# Patient Record
Sex: Male | Born: 1950 | Race: White | Hispanic: No | Marital: Single | Smoking: Never smoker
Health system: Southern US, Community
[De-identification: ages and names within clinical notes are randomized; demographics above are authoritative.]

## PROBLEM LIST (undated history)

## (undated) DIAGNOSIS — Z955 Presence of coronary angioplasty implant and graft: Secondary | ICD-10-CM

## (undated) DIAGNOSIS — K769 Liver disease, unspecified: Secondary | ICD-10-CM

## (undated) DIAGNOSIS — I2510 Atherosclerotic heart disease of native coronary artery without angina pectoris: Secondary | ICD-10-CM

## (undated) DIAGNOSIS — F4310 Post-traumatic stress disorder, unspecified: Secondary | ICD-10-CM

## (undated) DIAGNOSIS — E7800 Pure hypercholesterolemia, unspecified: Secondary | ICD-10-CM

## (undated) DIAGNOSIS — I8290 Acute embolism and thrombosis of unspecified vein: Secondary | ICD-10-CM

## (undated) DIAGNOSIS — K219 Gastro-esophageal reflux disease without esophagitis: Secondary | ICD-10-CM

## (undated) DIAGNOSIS — Z9049 Acquired absence of other specified parts of digestive tract: Secondary | ICD-10-CM

## (undated) DIAGNOSIS — IMO0002 Reserved for concepts with insufficient information to code with codable children: Secondary | ICD-10-CM

## (undated) DIAGNOSIS — Z8719 Personal history of other diseases of the digestive system: Secondary | ICD-10-CM

## (undated) DIAGNOSIS — M1990 Unspecified osteoarthritis, unspecified site: Secondary | ICD-10-CM

## (undated) DIAGNOSIS — I10 Essential (primary) hypertension: Secondary | ICD-10-CM

## (undated) DIAGNOSIS — R519 Headache, unspecified: Secondary | ICD-10-CM

## (undated) DIAGNOSIS — E119 Type 2 diabetes mellitus without complications: Secondary | ICD-10-CM

## (undated) HISTORY — PX: PARTIAL NEPHRECTOMY: SHX414

## (undated) HISTORY — PX: CARPAL TUNNEL RELEASE: SHX101

## (undated) HISTORY — PX: SHOULDER SURGERY: SHX246

## (undated) HISTORY — PX: HAND SURGERY: SHX662

## (undated) HISTORY — PX: PILONIDAL CYST EXCISION: SHX744

## (undated) HISTORY — PX: STENT PLACEMENT VASCULAR (ARMC HX): HXRAD1737

## (undated) HISTORY — PX: APPENDECTOMY: SHX54

## (undated) HISTORY — PX: PARACENTESIS: SHX844

---

## 1997-12-21 ENCOUNTER — Ambulatory Visit: Admission: RE | Admit: 1997-12-21 | Discharge: 1997-12-21 | Payer: Self-pay | Admitting: Internal Medicine

## 1999-05-24 ENCOUNTER — Encounter: Payer: Self-pay | Admitting: Orthopedic Surgery

## 1999-05-24 ENCOUNTER — Encounter: Admission: RE | Admit: 1999-05-24 | Discharge: 1999-05-24 | Payer: Self-pay | Admitting: Orthopedic Surgery

## 1999-10-23 ENCOUNTER — Encounter: Payer: Self-pay | Admitting: Orthopedic Surgery

## 1999-10-23 ENCOUNTER — Encounter: Admission: RE | Admit: 1999-10-23 | Discharge: 1999-10-23 | Payer: Self-pay | Admitting: Orthopedic Surgery

## 2000-08-21 ENCOUNTER — Other Ambulatory Visit: Admission: RE | Admit: 2000-08-21 | Discharge: 2000-08-21 | Payer: Self-pay | Admitting: Orthopedic Surgery

## 2003-03-20 ENCOUNTER — Inpatient Hospital Stay (HOSPITAL_COMMUNITY): Admission: AD | Admit: 2003-03-20 | Discharge: 2003-03-22 | Payer: Self-pay | Admitting: Cardiology

## 2003-03-20 ENCOUNTER — Encounter: Payer: Self-pay | Admitting: Cardiology

## 2004-01-18 ENCOUNTER — Other Ambulatory Visit: Payer: Self-pay

## 2004-05-23 ENCOUNTER — Ambulatory Visit: Payer: Self-pay | Admitting: Cardiology

## 2005-09-25 ENCOUNTER — Inpatient Hospital Stay (HOSPITAL_COMMUNITY): Admission: EM | Admit: 2005-09-25 | Discharge: 2005-09-26 | Payer: Self-pay | Admitting: Emergency Medicine

## 2005-09-25 ENCOUNTER — Ambulatory Visit: Payer: Self-pay | Admitting: Cardiology

## 2005-10-09 ENCOUNTER — Ambulatory Visit: Payer: Self-pay | Admitting: Cardiology

## 2007-01-07 ENCOUNTER — Emergency Department: Payer: Self-pay | Admitting: Emergency Medicine

## 2007-03-11 ENCOUNTER — Encounter: Admission: RE | Admit: 2007-03-11 | Discharge: 2007-03-11 | Payer: Self-pay | Admitting: Orthopedic Surgery

## 2008-07-13 ENCOUNTER — Ambulatory Visit: Payer: Self-pay | Admitting: Internal Medicine

## 2009-06-14 ENCOUNTER — Encounter: Payer: Self-pay | Admitting: Cardiovascular Disease

## 2009-07-07 ENCOUNTER — Encounter: Payer: Self-pay | Admitting: Cardiovascular Disease

## 2009-08-07 ENCOUNTER — Encounter: Payer: Self-pay | Admitting: Cardiovascular Disease

## 2010-07-27 ENCOUNTER — Emergency Department: Payer: Self-pay | Admitting: Unknown Physician Specialty

## 2010-07-29 ENCOUNTER — Encounter: Payer: Self-pay | Admitting: Orthopedic Surgery

## 2010-11-22 NOTE — Cardiovascular Report (Signed)
NAMEKASS, HERBERGER NO.:  [id number]   MEDICAL RECORD NO.:  [id number]          PATIENT TYPE:  INP   LOCATION:  9390                         FACILITY:  [place]   PHYSICIAN:  Eustace Quail, M.D. [facility] DATE OF BIRTH:  1951-06-26   DATE OF PROCEDURE:  DATE OF DISCHARGE:  09/26/2005                              CARDIAC CATHETERIZATION   PAST MEDICAL HISTORY:  Mr. Perazzo is 60 years old and has a history of  nonobstructive coronary disease at catheterization several years ago.  Yesterday morning while at work, he developed substernal chest tightness  radiating to his back with diaphoresis and went to Dr. Vivia Budge office.  He  was transferred from Dr. Vivia Budge office to [facility] for further  evaluation and scheduled for angiography today.  His EKG was normal and his  troponin was negative.  He is diabetic and does have hyperlipidemia.   PROCEDURE:  The procedure was performed by the right femoral artery and  arterial sheath and 6-French freeform coronary catheters.  A front wall  arterial punch was performed and non-opaque contrast was used.  Distal  aortogram was performed to rule out abdominal aneurysm.  The patient  tolerated the procedure well and left the laboratory in satisfactory  condition.   The left main is free of disease.   Left anterior descending artery gave rise to three sets of perforators and  two diagonal branches.  There was 30% narrowing in the proximal LAD.  There  is 50% narrowing in the proximal portion of each of the first diagonal  branches.   The circumflex artery gave rise to a ramus branch, a large marginal branch,  and two small posterolateral branches.  These vessels were irregular, but  there is no significant obstruction.   The right coronary artery is a moderately large vessel and gave rise to a  conus branch, two ventricular branches, posterior descending branch, and  three posterolateral branches.  There was 30% narrowing  in the mid vessel  and irregularities in the proxima and mid vessel.   The left ventriculogram performed in the artery showed good wall motion with  no areas of hypokinesis.  The estimated ejection fraction was 60%.  A distal  aortogram was performed which showed patent renal arteries and no  significant aortic obstruction.  The aortic pressure was 119/72 with a mean  of 91, left ventricular pressure is 119/15.   CONCLUSION:  Nonobstructive coronary artery disease with 50% narrowing in  the proximal LAD, 50% narrowing in the first and second diagonal branches,  irregularities in the circumflex artery, and 30% narrowing in the mid right  coronary artery with normal LV function.   RECOMMENDATIONS:  Reassurance.  The etiology of the patient's symptoms are  not clear, but he does have symptoms of bloating after eating and symptoms  may be related to reflux.  His oxygen saturation is good and he has no  preexposing factors to pulmonary embolism.  We will plan a trial of a proton  pump inhibitor and arrange for him to see either Westley Foots or Dr. Debria Garret  in the  office next week.  I spoke with Westley Foots today.           ______________________________  Eustace Quail, M.D. [facility]     BB/MEDQ  D:  09/26/2005  T:  09/29/2005  Job:  [id number]   cc:   Judithe Modest, M.D.  Fax: [phone]   Sharlynn Oliphant, M.D.   CARDIOPULMONARY DEPARTMENT

## 2010-11-22 NOTE — Cardiovascular Report (Signed)
NAME:  Jerry [name], Jerry [name]                        ACCOUNT NO.:  [id number]   MEDICAL RECORD NO.:  [id number]                   PATIENT TYPE:  INP   LOCATION:  3715                                 FACILITY:  [place]   PHYSICIAN:  Scarlett Presto, M.D.                DATE OF BIRTH:  22-May-1951   DATE OF PROCEDURE:  03/21/2003  DATE OF DISCHARGE:                              CARDIAC CATHETERIZATION   HISTORY OF PRESENT ILLNESS:  Jerry [name] is a 60 year old man with no known  coronary artery disease, but risk factors of hypertension and diabetes  mellitus, who presented to the [facility] office on [facility] to see his cardiologist, Ethelle Lyon, M.D., complaining of  chest discomfort.  He was evaluated, deemed to have unstable angina, and  admitted to [facility] that day for heart catheterization the  following day.  He presents now for that procedure.   DETAILS OF THE PROCEDURE:  After obtaining informed consent, the patient was  brought to the cardiac catheterization laboratory in the fasting state.  There he was prepped and draped in the usual sterile manner and the right  groin was anesthetized using 1% lidocaine without epinephrine.  The right  femoral artery was cannulated using the modified Seldinger technique with a  6 French 10 cm sheath and left heart catheterization was performed using a 6  French Judkins left #4, a 6 French Judkins right #4, and a 6 French angled  pigtail catheter.  The pigtail catheter was used for left ventriculogram  which was shot in the 30 degree RAO view.  At the conclusion of the  procedure, the catheter was sutured back in place due to the patient having  a dose of Lovenox prior to the catheterization, as well as Integrilin.   RESULTS:  The aortic pressure was 124/70 with a mean arterial pressure of 93  mmHg.   The left ventricular pressure was 119/111 with an end-diastolic pressure of  23 mmHg.   SELECTIVE CORONARY  ANGIOGRAPHY:  The left main coronary artery is a large  vessel which is angiographically unremarkable.   The left anterior descending coronary artery is a moderate caliber vessel  with a 50% stenosis in its mid portion.  There are two diagonals that come  off just distal to the area of the lesion and then there was a second 50%  stenosis after the two diagonals.   The right coronary artery is a large dominant vessel with a 25% stenosis in  its proximal portion.  There are luminal irregularities along the mid  portion of the artery.  There are three acute marginals.  The posterior  descending artery is small.  There is a large posterolateral branching  system.  There is no significant disease in the posterior descending  coronary artery.   The left circumflex coronary artery is a medium caliber artery with a high  branching  first obtuse marginal which is small and a small second obtuse  marginal.  There is no significant disease in the circumflex coronary artery  distribution.   Left ventriculography reveals a left ventriculogram with an ejection  fraction of 63% with no wall motion abnormalities and no mitral  regurgitation.   ASSESSMENT:  1. Nonobstructive coronary disease.  2. Normal left ventricular function.   PLAN:  Discharge the patient to home in the morning.  Will aggressively  modify his risk factors and follow through from there.                                               Scarlett Presto, M.D.    JH/MEDQ  D:  03/21/2003  T:  03/21/2003  Job:  [id number]

## 2010-11-22 NOTE — Discharge Summary (Signed)
Jerry [name], [name]              ACCOUNT NO.:  [id number]   MEDICAL RECORD NO.:  [id number]          PATIENT TYPE:  INP   LOCATION:  3016                         FACILITY:  [place]   PHYSICIAN:  Eustace Quail, M.D. [facility] DATE OF BIRTH:  1951-04-08   DATE OF ADMISSION:  09/25/2005  DATE OF DISCHARGE:  09/26/2005                                 DISCHARGE SUMMARY   PROCEDURES:  1.  Cardiac catheterization.  2.  Coronary arteriogram.  3.  Left ventriculogram.   PRIMARY DIAGNOSIS:  Chest pain, status post cardiac catheterization with  nonobstructive coronary artery disease this admission, follow-up with  cardiology and with primary MD.   SECONDARY DIAGNOSES:  1.  Diabetes, hemoglobin A1c pending at the time of dictation, but recently      greater than 11 at primary MD's office.  2.  Hyperlipidemia with a total cholesterol of 139, triglycerides 159, HDL      27, LDL 80 on therapy, increase Lipitor.  3.  Hypokalemia, supplemented.  4.  Obesity.  5.  Medical noncompliance.  6.  Chronically increased liver function tests, follow-up on increased dose      of Lipitor.  7.  Hypertension.  8.  Family history of coronary artery disease.  9.  Status post shoulder surgery x3.  10. Appendectomy.  11. Allergy or intolerance to penicillin.   TIME AT DISCHARGE:  38 minutes.   HOSPITAL COURSE:  Jerry [name] is a 60 year old patient of Dr. Christy Sartorius.  She  had a cath in 2004 that showed nonobstructive disease.  He had chest pain  that radiated through to his back and was associated with diaphoresis.  He  came to the hospital and was admitted for further evaluation and treatment.  His cardiac enzymes were negative for MI.  His potassium was minimally low  at 3.4 and was supplemented.  His liver enzymes were checked and were only  minimally elevated with an SGOT of 44, SGPT of 61.  His alkaline phosphatase  and total bilirubin were within normal limits.  His CBGs were greater than  200.   It was  felt that cardiac catheterization was indicated to further define his  anatomy and this was performed on September 26, 2005.   The cardiac catheterization showed nonobstructive coronary artery disease  between 30 and 50% in the LAD, RCA and first and second diagonal branches.  His EF was normal.  There was no significant renal artery stenosis or distal  disease in his abdominal aortogram.  Dr. Olevia Perches felt that the etiology of  his symptoms was not clear but felt it was possibly reflux, so proton pump  inhibitor was added to his medication regimen.  Mr. Ryland bed rest is  pending completion but if  he ambulates without chest pain or shortness of  breath and his groin is without ecchymosis or hematoma, he is tentative  considered stable for discharge with outpatient follow-up arranged.   DISCHARGE INSTRUCTIONS:  1.  His activity level is to be reduced for two days.  2.  He is to stick to a low fat diabetic diet.  3.  He is to call our office for problems with the cath site.  4.  He is to follow up with Dr. Christy Sartorius PA or nurse practitioner on October 09, 2005, at 11 a.m.  5.  He is to see Dr. Debria Garret next week.   DISCHARGE MEDICATIONS:  1.  Nexium 40 mg a day.  2.  Lipitor 20 mg a day.  3.  Metformin 500 mg two tablets b.i.d., hold until September 29, 2005.  4.  Lisinopril/hydrochlorothiazide 12/20 mg a day.  5.  Aspirin 325 mg a day.  6.  Jenuvia 100 units daily.      Rosaria Ferries, P.A. [facility]    ______________________________  Eustace Quail, M.D. [facility]    RB/MEDQ  D:  09/26/2005  T:  09/29/2005  Job:  [id number]   cc:   Judithe Modest, M.D.  Fax: [phone]

## 2010-11-22 NOTE — Discharge Summary (Signed)
NAME:  Jerry [name], Jerry [name]                        ACCOUNT NO.:  [id number]   MEDICAL RECORD NO.:  [id number]                   PATIENT TYPE:  INP   LOCATION:  0623                                 FACILITY:  [place]   PHYSICIAN:  Ethelle Lyon, M.D.             DATE OF BIRTH:  Sep 20, 1950   DATE OF ADMISSION:  03/20/2003  DATE OF DISCHARGE:  03/22/2003                           DISCHARGE SUMMARY - REFERRING   PROCEDURES:  1. Cardiac catheterization.  2. Coronary arteriogram.  3. Left ventriculogram.   HOSPITAL COURSE:  Jerry [name] is a 60 year old male with no known history of  coronary artery disease.  He has multiple cardiac risk factors including  hyperlipidemia and uncontrolled diabetes.  He saw his family physician, Dr.  Judithe Modest, for chest pain and was referred to [facility].  He  was seen in our office on March 20, 2003 and his symptoms were  concerning for unstable anginal pain.  He was admitted to the hospital for  further evaluation and treatment.   Jerry [name] enzymes were negative for MI but with multiple cardiac risk  factors and concerning symptoms, he was scheduled for cardiac  catheterization.  This was performed on March 21, 2003.   The cardiac catheterization showed nonobstructive disease in the LAD with  two 50% lesions and a minor lesion in the RCA.  Dr. Scarlett Presto reviewed  the films and felt that his coronary artery disease was nonobstructive and  his EF was normal at 63% with no wall motion abnormalities and no MR.  He  felt that aggressive risk factor reduction was indicated.   Jerry [name], for various reasons, had not been checking his blood sugar and  had not been taking his medications.  His hemoglobin A1c was 12.5%.  The  situation was reviewed with the diabetes coordination and Dr. Jacqulyn Ducking, as well as Dr. Wilhemina Cash.  It was felt that he could be started on  Metformin 850 mg b.i.d., once the 48 hours had elapsed post  catheterization.  Also, he was given a prescription for a home glucose meter and he is to  check his blood sugars twice a day.  He is to follow up with Dr. Debria Garret and  further medication adjustments will be made by Dr. Debria Garret.   As part of his cardiac workup, a lipid profile was checked as well.  This  showed a total cholesterol of 163, triglycerides 159, HDL 33, LDL 98.  The  data were reviewed by Dr. Lattie Haw, who felt that in this patient with  significant diabetes and mild-to-moderate coronary artery disease, a statin  was indicated.  He is to follow up with liver and cholesterol testing in  three months.   By March 22, 2003, Jerry [name]'s chest pain had resolved.  His groin was  stable post catheterization.  He was ambulating without chest pain or  shortness of breath.  He had had a  beta blocker added to his medication  regimen, but his systolic blood pressure was approximately 90 at times and  this was discontinued.  He is to continue on a baby aspirin a day and other  medications.  He is to follow up with cardiology and primary care.  Mr.  [name] was considered stable for discharge on March 22, 2003.   LABORATORY VALUES:  Hemoglobin 14.9, hematocrit 43.8, WBC 7.3, platelets  211,000.  Sodium 138, potassium 3.6, chloride 105, CO2 25, BUN 7, creatinine  0.9, glucose 186.   Chest x-ray:  Lungs are clear.  No active disease.   DISCHARGE CONDITION:  Improved.   DISCHARGE DIAGNOSES:  1. Chest pain, no critical coronary artery disease by catheterization with     preserved left ventricular function.  2. Diabetes mellitus.  3. Mild hypertriglyceridemia.  4. Status post umbilical herniorrhaphy.  5. History of shoulder surgery x3.  6. Status post appendectomy.  7. Allergy to penicillin.   DISCHARGE INSTRUCTIONS:  1. His activity level is to include no strenuous activity for two days.  2. He is to stick to a diabetic diet.  3. He is to call the office for problems with the  catheterization site.  4. He is to follow up with Dr. Debria Garret.  5. He is to follow up with Dr. Ethelle Lyon on April 11, 2003 at 2:15.   DISCHARGE MEDICATIONS:  1. Coated aspirin 81 mg daily.  2. Relafen as needed.  3. Lipitor 20 mg daily.  4. Metformin 850 mg b.i.d., start March 24, 2003.      Davis Gourd, P.A. [facility]                  Ethelle Lyon, M.D.    RG/MEDQ  D:  03/22/2003  T:  03/22/2003  Job:  [id number]   cc:   Ethelle Lyon, M.D.   Judithe Modest, M.D.  [address]  [place]  [place] [address]  Fax: [phone]

## 2010-11-22 NOTE — H&P (Signed)
NAMERANDLE, SHATZER              ACCOUNT NO.:  [id number]   MEDICAL RECORD NO.:  [id number]          PATIENT TYPE:  INP   LOCATION:  1832                         FACILITY:  [place]   PHYSICIAN:  Minus Breeding, M.D. LHCDATE OF BIRTH:  April 08, 1951   DATE OF ADMISSION:  09/25/2005  DATE OF DISCHARGE:                                HISTORY & PHYSICAL   PRIMARY CARE PHYSICIAN:  Dr. Debria Garret in the [place] area.   HISTORY OF PRESENT ILLNESS:  Mr. Laverdure is a 60 year old Caucasian  gentleman with a known history of non-obstructive coronary artery disease,  status post cardiac catheterization in 2004, by Dr. Scarlett Presto, at that  time secondary to palpitations, per the patient, with recommendations for  strong life style modifications.  Mr. Williemae Natter, however, is a poorly-  controlled diabetic with a hemoglobin A1c of 11.1 yesterday by Dr. Debria Garret.  Mr. Carino presented to Dr. Debria Garret today with chest discomfort, onset around  8 a.m. this morning while he was at work doing a non-physically exerting  task.  He began having some mid-sternal discomfort that radiated through to  his back.  He rated it at a 5 at that time.  He became very diaphoretic and  pale.  He sat down and the discomfort eased off to around a 2, but continued  to maintain a slight pressure.  He went ahead and ate lunch and then went  home.  The discomfort continued to wax and wane for several hours.  He took  nitroglycerin sublingual, with relief.  He went to his primary care  physician's office complaining of chest discomfort.  He was given aspirin  and EMS was notified.  The patient continues to have chest discomfort around  a 2.  The patient states he does not check his blood sugars at home and is  not compliant with a diet.   ALLERGIES:  PENICILLIN.   MEDICATIONS:  1.  Aspirin 325 mg daily.  2.  Lipitor 5 mg daily.  3.  Metformin 1000 mg p.o. b.i.d.  4.  Zestoretic TM/12.5 mg daily.  5.  __________ 100 mg daily, which  is a new medicine.  6.  Actos 15 mg daily.   PAST MEDICAL/SURGICAL HISTORY:  1.  Coronary artery disease, status post cardiac catheterization in 2004,      showing non-obstructive coronary artery disease.  At that time the      patient had an ejection fraction of 63% with normal wall motion, and no      MR.  The right coronary artery was the large dominant vessel with a 25%      stenosis in its proximal portion with luminal irregularities along the      mid-portion of the artery.  The LAD is a moderate caliber vessel which      had a 50% stenosis in its mid-portion.  There are two diagonals that      come off just distal to the area of the lesion, and then there was a      second 50% stenosis after the first two diagonals.  2.  Hypertension.  3.  Poorly-controlled diabetes.  4.  Shoulder surgery x3.  5.  Appendectomy.  6.  Chronic increase in liver function tests.   SOCIAL HISTORY:  He lives in [place] with his wife.  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  He has two children.  Denies tobacco use.  Occasional glass of wine.  Denies any drugs or herbal medication use.  Diet  is pretty much regular.   FAMILY HISTORY:  Mother alive at age 95.  She has had five strokes, bypass,  and has had a pacer.  Father deceased at age 53, secondary to myocardial  infarction.  He also had several strokes.   REVIEW OF SYSTEMS:  Positive for blurred vision, chest pain, shortness of  breath, dyspnea on exertion, peripheral edema and pre-syncopal episodes.  He  complains of numbness and tingling in his feet.  All other systems negative  per the patient.   PHYSICAL EXAMINATION:  VITAL SIGNS:  Temperature 98.4 degrees, pulse 69,  respirations 18, blood pressure 142/79.  The patient's saturation is 98% on  room air.  GENERAL:  He is quiet, very reserved and in no acute distress.  HEENT:  Pupils equal, round, reactive to light.  Head normocephalic and  atraumatic.  Sclerae clear.  NECK:  Supple  without lymphadenopathy.  Negative bruit or jugular venous  distention.  CARDIOVASCULAR:  Heart:  Regular rate and rhythm.  S1 and S2.  Pulses are 2+  and equal, without bruits.  LUNGS:  Clear to auscultation bilaterally.  ABDOMEN:  Soft, nontender.  Positive bowel sounds.  EXTREMITIES:  Without clubbing, cyanosis or edema.  He has 2+ DP's  bilaterally.  NEUROLOGIC:  Alert and oriented x3.  Cranial nerves II-XII  grossly intact.   Chest x-ray is pending.   Electrocardiogram:  Rate of 69, sinus rhythm, with non-specific T-wave  changes.   LABORATORY DATA:  Pending.   PLAN:  Dr. Minus Breeding in to examine and assess the patient.  The patient  with very significant risk factors with a history of a 50% LAD disease in  2004, chest pain at 5, pre-test probability of obstructive coronary artery  disease.  Plan for a cardiac catheterization in the a.m.  The risks and  benefits of the procedure have been explained to the patient and to his  wife.  The patient agrees to proceed with the procedure.  We will continue  his home medications, excluding his Glucophage, gently diurese and educate  the patient with cardiac rehabilitation with a dietician and also a diabetic  educator, the importance of his risk factor modification.      Rosanne Sack, NP    ______________________________  Minus Breeding, M.D. [facility]    MB/MEDQ  D:  09/25/2005  T:  09/26/2005  Job:  [id number]

## 2010-11-22 NOTE — H&P (Signed)
NAME:  Jerry [name], Jerry [name].                       ACCOUNT NO.:  [id number]   MEDICAL RECORD NO.:  [id number]                   PATIENT TYPE:  INP   LOCATION:                                       FACILITY:  [facility]   PHYSICIAN:  Ethelle Lyon, M.D.             DATE OF BIRTH:  11/05/1950   DATE OF ADMISSION:  03/20/2003  DATE OF DISCHARGE:                                HISTORY & PHYSICAL   CHIEF COMPLAINT:  Chest discomfort.   HISTORY OF PRESENT ILLNESS:  Jerry [name] is [name] very pleasant 60 year old male  with no known history of coronary artery disease.  He does have [name] history of  diabetes mellitus.  He is currently not on therapy for this.  The patient is  referred by Dr. Debria Garret.  He has been noticing chest discomfort that has been  getting worse over the last four to five weeks.  It mostly comes on with  exertion.  It usually goes away with rest after about five to ten minutes.  He has also noted some symptoms with rest that is associated with  palpitations.  This usually goes away after about five minutes on its own.  He denies any radiation to his arms.  He denies any nausea.  He does note  radiation of the symptoms in his chest up into his neck and jaw.  He  describes his chest discomfort as [name] similar feeling as to when he eats cold  ice cream.  He also does describe it as [name] tightness.  He can usually predict  which activities will bring on his chest pain and he has been trying to  avoid those recently. Walking up the steps this morning did bring on chest  discomfort and he is having chest discomfort in the office today.  We plan  to admit him directly from our office to [facility] today for  catheterization tomorrow.   PAST MEDICAL HISTORY:  Significant for diabetes as noted above.  The patient  is supposed to be on medication but is not taking it per his own choice.  He  has been trying to lose weight.  He denies any history of hypertension,  hyperlipidemia, or  thyroid disease.  He is status post umbilical  herniorrhaphy.  He has had left shoulder surgery x3 and is status post  appendectomy.   MEDICATIONS:  1. Relafen 750 mg q.[name].m.  2. Enteric-coated aspirin 325 mg daily   ALLERGIES:  PENICILLIN.   SOCIAL HISTORY:  He denies any cigarette use.  He only drinks alcohol on  occasion.  He works at [employer] in [employer].  He is [name] Glass blower/designer.  He is  married and has two children.  He lives in [place], [place].   FAMILY HISTORY:  Significant for arteriosclerotic vascular disease and  coronary artery disease.  His father died of an MI at age 58.  He had  previous strokes.  His mother is alive at 39.  She has have five strokes and  three myocardial infarctions.  Her first MI was at age 60.  She is status  post CABG and status post pacemaker.   REVIEW OF SYSTEMS:  Please see HPI.  He denies any PND, orthopnea, or edema.  He does have nonproductive cough with his chest discomfort.  He denies any  sore throat, melena, hematochezia, fever, chills, hematuria, dysuria,  numbness, or tingling.   PHYSICAL EXAMINATION:  GENERAL:  He is [name] well-nourished, well-developed male  in no acute distress.  VITAL SIGNS:  Blood pressure is 112/80 on the right and 110/74 on the left,  pulse 72, weight 220 pounds.  HEENT:  Unremarkable.  NECK:  Without JVD or carotid bruits.  CARDIAC:  Normal S1 and S2.  Regular rate and rhythm without murmurs.  LUNGS:  Clear to auscultation bilaterally.  ABDOMEN:  Soft, nontender, without hepatosplenomegaly.  EXTREMITIES:  Without edema.  VASCULAR:  Shows 2+ femoral artery, dorsalis pedis pulses, and posterior  tibialis pulses.  There are no femoral artery bruits bilaterally.  NEUROLOGIC:  Nonfocal.   LABORATORY DATA:  Electrocardiogram in the office today reveals normal sinus  rhythm, heart rate 64, normal axis, no acute changes.   IMPRESSION:  1. Unstable angina pectoris.  2. Untreated diabetes mellitus.  3.  Significant family history for coronary artery disease.   PLAN:  The patient will be admitted from the office today directly to [facility].  We will start him on enoxaparin, aspirin, and beta blocker.  Will check serial enzymes to rule out MI.  Will set him up for left heart  catheterization tomorrow to further define his coronary anatomy.  Will also  check [name] hemoglobin A1c and monitor his sugars while he is in the hospital.  Will put him on sliding scale insulin while he is in the hospital in case  his sugars are running high.      Richardson Dopp, P.[name].                        Ethelle Lyon, M.D.    SW/MEDQ  D:  03/20/2003  T:  03/20/2003  Job:  [id number]   cc:   Jerry [name], M.D.  [address]  [place]  [place] [address]  Fax: [phone]

## 2011-03-20 ENCOUNTER — Ambulatory Visit: Payer: Self-pay

## 2011-03-26 ENCOUNTER — Ambulatory Visit: Payer: Self-pay | Admitting: Family Medicine

## 2016-01-21 ENCOUNTER — Emergency Department (HOSPITAL_COMMUNITY): Payer: Non-veteran care

## 2016-01-21 ENCOUNTER — Encounter (HOSPITAL_COMMUNITY): Payer: Self-pay | Admitting: *Deleted

## 2016-01-21 ENCOUNTER — Inpatient Hospital Stay (HOSPITAL_COMMUNITY)
Admission: EM | Admit: 2016-01-21 | Discharge: 2016-01-30 | DRG: 442 | Disposition: A | Discharge status: Home Health | Payer: Non-veteran care | Attending: Internal Medicine | Admitting: Internal Medicine

## 2016-01-21 DIAGNOSIS — E7800 Pure hypercholesterolemia, unspecified: Secondary | ICD-10-CM | POA: Diagnosis present

## 2016-01-21 DIAGNOSIS — Z79899 Other long term (current) drug therapy: Secondary | ICD-10-CM | POA: Diagnosis not present

## 2016-01-21 DIAGNOSIS — R112 Nausea with vomiting, unspecified: Secondary | ICD-10-CM | POA: Diagnosis present

## 2016-01-21 DIAGNOSIS — I10 Essential (primary) hypertension: Secondary | ICD-10-CM | POA: Diagnosis present

## 2016-01-21 DIAGNOSIS — K219 Gastro-esophageal reflux disease without esophagitis: Secondary | ICD-10-CM | POA: Diagnosis present

## 2016-01-21 DIAGNOSIS — Z7982 Long term (current) use of aspirin: Secondary | ICD-10-CM | POA: Diagnosis not present

## 2016-01-21 DIAGNOSIS — Z955 Presence of coronary angioplasty implant and graft: Secondary | ICD-10-CM

## 2016-01-21 DIAGNOSIS — I2510 Atherosclerotic heart disease of native coronary artery without angina pectoris: Secondary | ICD-10-CM

## 2016-01-21 DIAGNOSIS — N390 Urinary tract infection, site not specified: Secondary | ICD-10-CM

## 2016-01-21 DIAGNOSIS — K7460 Unspecified cirrhosis of liver: Secondary | ICD-10-CM | POA: Diagnosis present

## 2016-01-21 DIAGNOSIS — E119 Type 2 diabetes mellitus without complications: Secondary | ICD-10-CM | POA: Diagnosis present

## 2016-01-21 DIAGNOSIS — K567 Ileus, unspecified: Secondary | ICD-10-CM | POA: Diagnosis present

## 2016-01-21 DIAGNOSIS — Z7984 Long term (current) use of oral hypoglycemic drugs: Secondary | ICD-10-CM | POA: Diagnosis not present

## 2016-01-21 DIAGNOSIS — I81 Portal vein thrombosis: Secondary | ICD-10-CM

## 2016-01-21 DIAGNOSIS — E0800 Diabetes mellitus due to underlying condition with hyperosmolarity without nonketotic hyperglycemic-hyperosmolar coma (NKHHC): Secondary | ICD-10-CM | POA: Diagnosis not present

## 2016-01-21 DIAGNOSIS — K921 Melena: Secondary | ICD-10-CM | POA: Diagnosis present

## 2016-01-21 DIAGNOSIS — E118 Type 2 diabetes mellitus with unspecified complications: Secondary | ICD-10-CM | POA: Diagnosis not present

## 2016-01-21 DIAGNOSIS — Z8554 Personal history of malignant neoplasm of ureter: Secondary | ICD-10-CM

## 2016-01-21 DIAGNOSIS — E860 Dehydration: Secondary | ICD-10-CM | POA: Diagnosis present

## 2016-01-21 DIAGNOSIS — Z85528 Personal history of other malignant neoplasm of kidney: Secondary | ICD-10-CM | POA: Diagnosis not present

## 2016-01-21 DIAGNOSIS — R509 Fever, unspecified: Secondary | ICD-10-CM | POA: Diagnosis not present

## 2016-01-21 DIAGNOSIS — R0602 Shortness of breath: Secondary | ICD-10-CM

## 2016-01-21 DIAGNOSIS — I2583 Coronary atherosclerosis due to lipid rich plaque: Secondary | ICD-10-CM

## 2016-01-21 HISTORY — DX: Reserved for concepts with insufficient information to code with codable children: IMO0002

## 2016-01-21 HISTORY — DX: Pure hypercholesterolemia, unspecified: E78.00

## 2016-01-21 HISTORY — DX: Essential (primary) hypertension: I10

## 2016-01-21 HISTORY — DX: Type 2 diabetes mellitus without complications: E11.9

## 2016-01-21 LAB — CBC
HEMATOCRIT: 42.7 % (ref 39.0–52.0)
Hemoglobin: 14.4 g/dL (ref 13.0–17.0)
MCH: 31 pg (ref 26.0–34.0)
MCHC: 33.7 g/dL (ref 30.0–36.0)
MCV: 91.8 fL (ref 78.0–100.0)
Platelets: 173 10*3/uL (ref 150–400)
RBC: 4.65 MIL/uL (ref 4.22–5.81)
RDW: 13.4 % (ref 11.5–15.5)
WBC: 9.7 10*3/uL (ref 4.0–10.5)

## 2016-01-21 LAB — LIPASE, BLOOD: LIPASE: 46 U/L (ref 11–51)

## 2016-01-21 LAB — URINALYSIS, ROUTINE W REFLEX MICROSCOPIC
Bilirubin Urine: NEGATIVE
GLUCOSE, UA: NEGATIVE mg/dL
Ketones, ur: NEGATIVE mg/dL
LEUKOCYTES UA: NEGATIVE
Nitrite: POSITIVE — AB
PH: 5 (ref 5.0–8.0)
Protein, ur: NEGATIVE mg/dL
Specific Gravity, Urine: 1.027 (ref 1.005–1.030)

## 2016-01-21 LAB — URINE MICROSCOPIC-ADD ON

## 2016-01-21 LAB — GLUCOSE, CAPILLARY
GLUCOSE-CAPILLARY: 328 mg/dL — AB (ref 65–99)
GLUCOSE-CAPILLARY: 252 mg/dL — AB (ref 65–99)
Glucose-Capillary: 310 mg/dL — ABNORMAL HIGH (ref 65–99)

## 2016-01-21 LAB — COMPREHENSIVE METABOLIC PANEL
ALT: 66 U/L — ABNORMAL HIGH (ref 17–63)
AST: 61 U/L — AB (ref 15–41)
Albumin: 3.2 g/dL — ABNORMAL LOW (ref 3.5–5.0)
Alkaline Phosphatase: 126 U/L (ref 38–126)
Anion gap: 6 (ref 5–15)
BUN: 15 mg/dL (ref 6–20)
CHLORIDE: 107 mmol/L (ref 101–111)
CO2: 26 mmol/L (ref 22–32)
Calcium: 8.9 mg/dL (ref 8.9–10.3)
Creatinine, Ser: 0.86 mg/dL (ref 0.61–1.24)
GFR calc Af Amer: >60 mL/min (ref >60)
Glucose, Bld: 199 mg/dL — ABNORMAL HIGH (ref 65–99)
POTASSIUM: 3.8 mmol/L (ref 3.5–5.1)
SODIUM: 139 mmol/L (ref 135–145)
Total Bilirubin: 0.8 mg/dL (ref 0.3–1.2)
Total Protein: 7 g/dL (ref 6.5–8.1)

## 2016-01-21 LAB — MRSA PCR SCREENING: MRSA by PCR: NEGATIVE

## 2016-01-21 LAB — HEPARIN LEVEL (UNFRACTIONATED): Heparin Unfractionated: 0.34 IU/mL (ref 0.30–0.70)

## 2016-01-21 LAB — LACTIC ACID, PLASMA
LACTIC ACID, VENOUS: 2 mmol/L — AB (ref 0.5–1.9)
LACTIC ACID, VENOUS: 2.8 mmol/L — AB (ref 0.5–1.9)

## 2016-01-21 MED ORDER — FENTANYL CITRATE (PF) 100 MCG/2ML IJ SOLN
100.0000 ug | Freq: Once | INTRAMUSCULAR | Status: AC
  Administered 2016-01-21: 100 ug via INTRAVENOUS
  Filled 2016-01-21: qty 2

## 2016-01-21 MED ORDER — ONDANSETRON HCL 4 MG/2ML IJ SOLN
4.0000 mg | Freq: Once | INTRAMUSCULAR | Status: AC
  Administered 2016-01-21: 4 mg via INTRAVENOUS
  Filled 2016-01-21: qty 2

## 2016-01-21 MED ORDER — HYDROCODONE-ACETAMINOPHEN 5-325 MG PO TABS
1.0000 | ORAL_TABLET | ORAL | Status: DC | PRN
  Administered 2016-01-21 – 2016-01-27 (×9): 2 via ORAL
  Filled 2016-01-21 (×10): qty 2

## 2016-01-21 MED ORDER — ONDANSETRON 4 MG PO TBDP
4.0000 mg | ORAL_TABLET | Freq: Once | ORAL | Status: AC | PRN
  Administered 2016-01-21: 4 mg via ORAL

## 2016-01-21 MED ORDER — ACETAMINOPHEN 650 MG RE SUPP: 650.0000 mg | Freq: Four times a day (QID) | RECTAL | Status: DC | PRN

## 2016-01-21 MED ORDER — IOPAMIDOL (ISOVUE-300) INJECTION 61%
INTRAVENOUS | Status: AC
  Administered 2016-01-21: 100 mL
  Filled 2016-01-21: qty 100

## 2016-01-21 MED ORDER — SODIUM CHLORIDE 0.9 % IV BOLUS (SEPSIS)
1000.0000 mL | Freq: Once | INTRAVENOUS | Status: AC
  Administered 2016-01-21: 1000 mL via INTRAVENOUS

## 2016-01-21 MED ORDER — BISACODYL 10 MG RE SUPP
10.0000 mg | Freq: Every day | RECTAL | Status: DC | PRN
  Administered 2016-01-24: 10 mg via RECTAL
  Filled 2016-01-21 (×2): qty 1

## 2016-01-21 MED ORDER — HYDROMORPHONE HCL 1 MG/ML IJ SOLN
1.0000 mg | Freq: Once | INTRAMUSCULAR | Status: AC
  Administered 2016-01-21: 1 mg via INTRAVENOUS
  Filled 2016-01-21: qty 1

## 2016-01-21 MED ORDER — ONDANSETRON 4 MG PO TBDP
ORAL_TABLET | ORAL | Status: AC
  Filled 2016-01-21: qty 1

## 2016-01-21 MED ORDER — MORPHINE SULFATE (PF) 2 MG/ML IV SOLN
1.0000 mg | INTRAVENOUS | Status: DC | PRN
  Administered 2016-01-21 – 2016-01-25 (×16): 1 mg via INTRAVENOUS
  Filled 2016-01-21 (×17): qty 1

## 2016-01-21 MED ORDER — ISOSORBIDE DINITRATE 10 MG PO TABS
30.0000 mg | ORAL_TABLET | Freq: Three times a day (TID) | ORAL | Status: DC
  Administered 2016-01-21 – 2016-01-30 (×26): 30 mg via ORAL
  Filled 2016-01-21: qty 3
  Filled 2016-01-21 (×4): qty 1
  Filled 2016-01-21 (×2): qty 3
  Filled 2016-01-21: qty 1
  Filled 2016-01-21 (×4): qty 3
  Filled 2016-01-21: qty 1
  Filled 2016-01-21 (×3): qty 3
  Filled 2016-01-21: qty 1
  Filled 2016-01-21: qty 3
  Filled 2016-01-21 (×2): qty 1
  Filled 2016-01-21 (×7): qty 3
  Filled 2016-01-21: qty 1
  Filled 2016-01-21 (×2): qty 3

## 2016-01-21 MED ORDER — ONDANSETRON HCL 4 MG PO TABS
4.0000 mg | ORAL_TABLET | Freq: Four times a day (QID) | ORAL | Status: DC | PRN
  Administered 2016-01-21 – 2016-01-22 (×2): 4 mg via ORAL
  Filled 2016-01-21 (×2): qty 1

## 2016-01-21 MED ORDER — ONDANSETRON HCL 4 MG/2ML IJ SOLN
4.0000 mg | Freq: Four times a day (QID) | INTRAMUSCULAR | Status: DC | PRN
  Administered 2016-01-21 – 2016-01-22 (×2): 4 mg via INTRAVENOUS
  Filled 2016-01-21 (×2): qty 2

## 2016-01-21 MED ORDER — TRAZODONE HCL 50 MG PO TABS: 25.0000 mg | ORAL_TABLET | Freq: Every evening | ORAL | Status: DC | PRN

## 2016-01-21 MED ORDER — SODIUM CHLORIDE 0.9 % IV BOLUS (SEPSIS)
500.0000 mL | Freq: Once | INTRAVENOUS | Status: AC
  Administered 2016-01-21: 500 mL via INTRAVENOUS

## 2016-01-21 MED ORDER — HEPARIN (PORCINE) IN NACL 100-0.45 UNIT/ML-% IJ SOLN
2000.0000 [IU]/h | INTRAMUSCULAR | Status: DC
  Administered 2016-01-21: 1450 [IU]/h via INTRAVENOUS
  Administered 2016-01-21 (×2): 1650 [IU]/h via INTRAVENOUS
  Administered 2016-01-22: 1750 [IU]/h via INTRAVENOUS
  Administered 2016-01-23: 1850 [IU]/h via INTRAVENOUS
  Administered 2016-01-23: 2050 [IU]/h via INTRAVENOUS
  Administered 2016-01-24 – 2016-01-26 (×4): 2400 [IU]/h via INTRAVENOUS
  Administered 2016-01-27 – 2016-01-28 (×2): 2000 [IU]/h via INTRAVENOUS
  Filled 2016-01-21 (×14): qty 250

## 2016-01-21 MED ORDER — MORPHINE SULFATE (PF) 4 MG/ML IV SOLN
6.0000 mg | Freq: Once | INTRAVENOUS | Status: AC
  Administered 2016-01-21: 6 mg via INTRAVENOUS
  Filled 2016-01-21: qty 2

## 2016-01-21 MED ORDER — MAGNESIUM CITRATE PO SOLN: 1.0000 | Freq: Once | ORAL | Status: DC | PRN

## 2016-01-21 MED ORDER — HEPARIN BOLUS VIA INFUSION
3000.0000 [IU] | Freq: Once | INTRAVENOUS | Status: AC
  Administered 2016-01-21: 3000 [IU] via INTRAVENOUS
  Filled 2016-01-21: qty 3000

## 2016-01-21 MED ORDER — SENNOSIDES-DOCUSATE SODIUM 8.6-50 MG PO TABS: 1.0000 | ORAL_TABLET | Freq: Every evening | ORAL | Status: DC | PRN

## 2016-01-21 MED ORDER — PROMETHAZINE HCL 25 MG/ML IJ SOLN
25.0000 mg | Freq: Once | INTRAMUSCULAR | Status: AC
  Administered 2016-01-21: 25 mg via INTRAVENOUS
  Filled 2016-01-21: qty 1

## 2016-01-21 MED ORDER — SODIUM CHLORIDE 0.9 % IV SOLN
INTRAVENOUS | Status: DC
  Administered 2016-01-21 – 2016-01-23 (×5): via INTRAVENOUS

## 2016-01-21 MED ORDER — INSULIN ASPART 100 UNIT/ML ~~LOC~~ SOLN
0.0000 [IU] | Freq: Three times a day (TID) | SUBCUTANEOUS | Status: AC
  Administered 2016-01-21: 7 [IU] via SUBCUTANEOUS
  Administered 2016-01-21: 9 [IU] via SUBCUTANEOUS
  Administered 2016-01-22 (×2): 3 [IU] via SUBCUTANEOUS
  Administered 2016-01-22: 2 [IU] via SUBCUTANEOUS
  Administered 2016-01-23 – 2016-01-24 (×5): 1 [IU] via SUBCUTANEOUS
  Administered 2016-01-24: 3 [IU] via SUBCUTANEOUS
  Administered 2016-01-25: 1 [IU] via SUBCUTANEOUS
  Administered 2016-01-25 – 2016-01-26 (×2): 2 [IU] via SUBCUTANEOUS

## 2016-01-21 MED ORDER — ACETAMINOPHEN 325 MG PO TABS: 650.0000 mg | ORAL_TABLET | Freq: Four times a day (QID) | ORAL | Status: DC | PRN

## 2016-01-21 MED ORDER — LISINOPRIL 5 MG PO TABS
5.0000 mg | ORAL_TABLET | Freq: Every day | ORAL | Status: DC
  Administered 2016-01-21 – 2016-01-30 (×9): 5 mg via ORAL
  Filled 2016-01-21 (×10): qty 1

## 2016-01-21 MED ORDER — HYDRALAZINE HCL 20 MG/ML IJ SOLN
5.0000 mg | Freq: Three times a day (TID) | INTRAMUSCULAR | Status: DC | PRN
  Administered 2016-01-21: 10 mg via INTRAVENOUS
  Administered 2016-01-23 – 2016-01-24 (×2): 5 mg via INTRAVENOUS
  Filled 2016-01-21 (×3): qty 1

## 2016-01-21 MED ORDER — SODIUM CHLORIDE 0.9% FLUSH
3.0000 mL | Freq: Two times a day (BID) | INTRAVENOUS | Status: DC
  Administered 2016-01-21 – 2016-01-30 (×6): 3 mL via INTRAVENOUS

## 2016-01-21 MED ORDER — HEPARIN BOLUS VIA INFUSION
4000.0000 [IU] | Freq: Once | INTRAVENOUS | Status: AC
  Administered 2016-01-21: 4000 [IU] via INTRAVENOUS
  Filled 2016-01-21: qty 4000

## 2016-01-21 MED ORDER — DEXTROSE 5 % IV SOLN
1.0000 g | INTRAVENOUS | Status: DC
  Administered 2016-01-21 – 2016-01-27 (×7): 1 g via INTRAVENOUS
  Filled 2016-01-21 (×7): qty 10

## 2016-01-21 NOTE — Progress Notes (Signed)
Ceftriaxone for UTI per pharmacy ordered.  Ceftriaxone does not need renal adjustment.  [employer] policy allows pharmacy to change the ordered dose based on indication without contacting the provider, therefore a consult is not required.   Plan: -ceftriaxone 1g IV q24h -pharmacy to sign off as no adjustment needed.  Brittanyann Wittner D. Jessiah Wojnar, PharmD, BCPS Clinical Pharmacist 01/21/2016 8:24 AM

## 2016-01-21 NOTE — Progress Notes (Signed)
Chief Complaint: Patient was seen in consultation today for portal vein thrombosis at the request of Dr. Linna Darner  Referring Physician(s): Dr. Linna Darner  Supervising Physician: Corrie Mckusick  Patient Status: Inpatient  History of Present Illness: Jerry [name] is a 65 y.o. male who developed acute onset of abdominal pain and some distention early this am. He presented to the ER and after workup, was found to have SMV and portal vein thrombosis. He has been admitted and started on IV heparin. IR is consulted to consider intravenous therapy. PMHx, meds, labs, imaging reviewed.  Currently resting bed, No N/V Says abdomen is 'sore'  Past Medical History  Diagnosis Date  . Diabetes mellitus without complication ([place])   . Hypercholesteremia   . Primary cancer of kidney or ureter   . Hypertension     Past Surgical History  Procedure Laterality Date  . Shoulder surgery    . Stent placement vascular ([facility] hx)      Allergies: Penicillins  Medications: Scheduled Meds: . cefTRIAXone (ROCEPHIN)  IV  1 g Intravenous Q24H  . insulin aspart  0-9 Units Subcutaneous TID WC  . isosorbide dinitrate  30 mg Oral TID  . lisinopril  5 mg Oral Daily  . ondansetron      . sodium chloride flush  3 mL Intravenous Q12H   Continuous Infusions: . sodium chloride 100 mL/hr at 01/21/16 1300  . heparin 1,650 Units/hr (01/21/16 1332)   PRN Meds:.acetaminophen **OR** acetaminophen, bisacodyl, hydrALAZINE, HYDROcodone-acetaminophen, magnesium citrate, morphine injection, ondansetron **OR** ondansetron (ZOFRAN) IV, senna-docusate, traZODone c   Family History  Problem Relation Age of Onset  . Stroke Mother   . Stroke Father     Social History   Social History  . Marital Status: Married    Spouse Name: N/A  . Number of Children: N/A  . Years of Education: N/A   Social History Main Topics  . Smoking status: Never Smoker   . Smokeless tobacco: None  . Alcohol Use: No    . Drug Use: No  . Sexual Activity: Not Asked   Other Topics Concern  . None   Social History Narrative  . None     Review of Systems: A 12 point ROS discussed and pertinent positives are indicated in the HPI above.  All other systems are negative.  Review of Systems  Vital Signs: BP 106/81 mmHg  Pulse 75  Temp(Src) 99.2 F (37.3 C) (Oral)  Resp 22  Ht 5' 11"  (1.803 m)  Wt 207 lb (93.895 kg)  BMI 28.88 kg/m2  SpO2 92%  Physical Exam  Constitutional: He is oriented to person, place, and time. He appears well-developed. No distress.  HENT:  Head: Normocephalic.  Mouth/Throat: Oropharynx is clear and moist.  Neck: Normal range of motion. No JVD present. No tracheal deviation present.  Cardiovascular: Normal rate, regular rhythm and normal heart sounds.  Exam reveals no friction rub.   No murmur heard. Pulmonary/Chest: Effort normal and breath sounds normal. No respiratory distress.  Abdominal: Soft. He exhibits distension. There is tenderness. There is no rebound and no guarding.  Neurological: He is alert and oriented to person, place, and time.  Skin: He is not diaphoretic.  Psychiatric: He has a normal mood and affect. Judgment normal.    Mallampati Score:  MD Evaluation Airway: WNL Heart: WNL Abdomen: WNL Chest/ Lungs: WNL ASA  Classification: 2 Mallampati/Airway Score: Two  Imaging: Ct Abdomen Pelvis W Contrast  01/21/2016  CLINICAL DATA:  Right-sided  abdominal pain EXAM: CT ABDOMEN AND PELVIS WITH CONTRAST TECHNIQUE: Multidetector CT imaging of the abdomen and pelvis was performed using the standard protocol following bolus administration of intravenous contrast. CONTRAST:  133m ISOVUE-300 IOPAMIDOL (ISOVUE-300) INJECTION 61% COMPARISON:  07/27/2010 FINDINGS: Lower chest and abdominal wall: Extensive atherosclerotic calcification in the coronary circulation. Previous ventral hernia repair with mesh. Hepatobiliary: Cirrhotic liver. No focal mass lesion is  seen. Portal hypertension with small ascites, mild splenomegaly, and venous collaterals.Occlusive portal thrombus seen in ileal branches with nonocclusive thrombus extending into the SMV and main portal vein. This has the appearance of bland thrombus. Pancreas: Unremarkable. Spleen: Unremarkable. Adrenals/Urinary Tract: Negative adrenals. Partial nephrectomy to the upper pole left kidney where a presumed renal cell carcinoma was seen previously. Bilateral renal cysts. No hydronephrosis or stone. Unremarkable bladder. Stomach/Bowel: Submucosal edema causes thickening of multiple ileal loops with mesenteric fat congestion. No bowel obstruction. Reproductive:Symmetric prostate enlargement. Vascular/Lymphatic: Portal vein thrombus as noted above. Aortic and iliac atherosclerosis. Mild enlargement of deep liver drainage lymph nodes, presumably reactive to the chronic liver disease. Other: Negative Musculoskeletal: No acute abnormalities. Diffuse disc and facet degeneration. IMPRESSION: 1. 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 Nonocclusive thrombus extends into the SMV and main portal veins. 2. Cirrhosis. 3. Small ascites. 4. Unremarkable appearance of left partial nephrectomy site. Electronically Signed   By: JMonte FantasiaM.D.   On: 01/21/2016 05:23    Labs:  CBC:  Recent Labs  01/21/16 0315  WBC 9.7  HGB 14.4  HCT 42.7  PLT 173    COAGS: No results for input(s): INR, APTT in the last 8760 hours.  BMP:  Recent Labs  01/21/16 0315  NA 139  K 3.8  CL 107  CO2 26  GLUCOSE 199*  BUN 15  CALCIUM 8.9  CREATININE 0.86  GFRNONAA >60  GFRAA >60    LIVER FUNCTION TESTS:  Recent Labs  01/21/16 0315  BILITOT 0.8  AST 61*  ALT 66*  ALKPHOS 126  PROT 7.0  ALBUMIN 3.2*    TUMOR MARKERS: No results for input(s): AFPTM, CEA, CA199, CHROMGRNA in the last 8760 hours.  Assessment and Plan: Nonocclusive thrombus of  the portal vein and SMV causing venous congestion and thickening of multiple loops of small bowel. Probable cirrhosis by imaging.  Initial lactate is 2.0 LFTs mildly elevated, INR ordered, but doesn't look like hepatic failure. Currently stable on hep gtt. Thrombolysis/thrombectomy can be considered if pt clinically worsens or shows signs of bowel ischemia Follow serial Lactic acid as ordered IR will follow closely. Briefly discussed IR thrombolysis procedure with pt as a potential option. He has no family.    Thank you for this interesting consult.    A copy of this report was sent to the requesting provider on this date.  Electronically Signed: BAscencion Dike7/17/2017, 2:47 PM   I spent a total of 25 minutes in face to face in clinical consultation, greater than 50% of which was counseling/coordinating care for portal vein thrombosis

## 2016-01-21 NOTE — H&P (Signed)
History and Physical    Jerry [name] IEP:[id number] DOB: April 08, 1951 DOA: 01/21/2016   PCP: No primary care provider on file.   Patient coming from:  Home    Chief Complaint: Nausea and vomiting   HPI: Jerry [name] is a 65 y.o. male with medical history significant for DM, Hyperlipidemia, Kidney/urether cancer (2001), presenting to the ED with  Acute onset of nausea, vomiting and abdominal distention since 1 am. Pain is constant, obtain out of 10, and he describes stated as "constant squeezing of the intestines". He was in his usual state of health until then, never had similar symptoms. Patient denies any fever, chills or night sweats. Denies lower extremity calf pain or leg swelling. Denies pre-syncopal episodes, palpitations or hemoptysis.Denies tobacco use, ETOH or recreational drug use. Patient denies taking hormone replacement therapy.Denies recent surgeries. He reports a family history of strokes in mother and father. Sedentary lifestyle. He takes ASA daily. Patient states that has never had a hematological evaluation prior to this admission or bone marrow biopsy. No recent long distance trips. Denies any recent infections or sick contacts. He denies constipation or diarrhea. Denies any bleeding issues.   ED Course:  BP 171/73 mmHg  Pulse 66  Temp(Src) 99.1 F (37.3 C)  Resp 18  Ht 5' 11"  (1.803 m)  Wt 93.895 kg (207 lb)  BMI 28.88 kg/m2  SpO2 97%  CT A/P: portal vein thrombosis with occlusive ileal vein clot causing congestion/ thickening of multiple ileal loops and non-occlusive thrombus, extending to the SMV and main portal vein  CBC and CMET unremarkable.  IV heparin initiated Accepted to SDU  Review of Systems: As per HPI otherwise 10 point review of systems negative.   Past Medical History  Diagnosis Date  . Diabetes mellitus without complication ([place])   . Hypercholesteremia   . Primary cancer of kidney or ureter     Past Surgical History  Procedure Laterality  Date  . Shoulder surgery    . Stent placement vascular ([facility] hx)      Social History Social History   Social History  . Marital Status: Married    Spouse Name: N/A  . Number of Children: N/A  . Years of Education: N/A   Occupational History  . Not on file.   Social History Main Topics  . Smoking status: Never Smoker   . Smokeless tobacco: Not on file  . Alcohol Use: No  . Drug Use: No  . Sexual Activity: Not on file   Other Topics Concern  . Not on file   Social History Narrative  . No narrative on file     Allergies  Allergen Reactions  . Penicillins     swelling    History reviewed. No pertinent family history.    Prior to Admission medications   Medication Sig Start Date End Date Taking? Authorizing Provider  glipiZIDE (GLUCOTROL) 10 MG tablet Take 5 mg by mouth every evening.   Yes Historical Provider, MD  isosorbide dinitrate (ISORDIL) 30 MG tablet Take 30 mg by mouth 3 (three) times daily.   Yes Historical Provider, MD  lisinopril (PRINIVIL,ZESTRIL) 10 MG tablet Take 5 mg by mouth daily.   Yes Historical Provider, MD  MAGNESIUM PO Take 1 tablet by mouth daily.   Yes Historical Provider, MD  metFORMIN (GLUCOPHAGE) 1000 MG tablet Take 1,000 mg by mouth 2 (two) times daily with a meal.   Yes Historical Provider, MD    Physical Exam:    Filed Vitals:  01/21/16 0545 01/21/16 0600 01/21/16 0700 01/21/16 0710  BP: 181/84 163/75 171/73 171/73  Pulse: 60 60 65 66  Temp:      Resp: 18 14 18 18   Height:      Weight:      SpO2: 97% 97% 97% 97%       Constitutional: very uncomfortable, moderate degree of distress due to pain  Filed Vitals:   01/21/16 0545 01/21/16 0600 01/21/16 0700 01/21/16 0710  BP: 181/84 163/75 171/73 171/73  Pulse: 60 60 65 66  Temp:      Resp: 18 14 18 18   Height:      Weight:      SpO2: 97% 97% 97% 97%   Eyes: PERRL, lids and conjunctivae normal ENMT: Mucous membranes are moist. Posterior pharynx clear of any exudate or  lesions.Normal dentition.  Neck: normal, supple, no masses, no thyromegaly Respiratory: clear to auscultation bilaterally, no wheezing, no crackles. Normal respiratory effort. No accessory muscle use.  Cardiovascular: Regular rate and rhythm, no murmurs / rubs / gallops. No extremity edema. 2+ pedal pulses. No carotid bruits.  Abdomen: distended, very tender on light palpation especially on the right upper and lower quadrant, decreased bowel sounds on the RUQ, no masses appreciated.  Musculoskeletal: no clubbing / cyanosis. No joint deformity upper and lower extremities. Good ROM, no contractures. Normal muscle tone.  Skin: no rashes, lesions, ulcers.  Neurologic: CN 2-12 grossly intact. Sensation intact, DTR normal. Strength 5/5 in all 4.  Psychiatric: Normal judgment and insight. Alert and oriented x 3. Normal mood.     Labs on Admission: I have personally reviewed following labs and imaging studies  CBC:  Recent Labs Lab 01/21/16 0315  WBC 9.7  HGB 14.4  HCT 42.7  MCV 91.8  PLT 412    Basic Metabolic Panel:  Recent Labs Lab 01/21/16 0315  NA 139  K 3.8  CL 107  CO2 26  GLUCOSE 199*  BUN 15  CREATININE 0.86  CALCIUM 8.9    GFR: Estimated Creatinine Clearance: 98.8 mL/min (by C-G formula based on Cr of 0.86).  Liver Function Tests:  Recent Labs Lab 01/21/16 0315  AST 61*  ALT 66*  ALKPHOS 126  BILITOT 0.8  PROT 7.0  ALBUMIN 3.2*    Recent Labs Lab 01/21/16 0315  LIPASE 46   No results for input(s): AMMONIA in the last 168 hours.  Coagulation Profile: No results for input(s): INR, PROTIME in the last 168 hours.  Cardiac Enzymes: No results for input(s): CKTOTAL, CKMB, CKMBINDEX, TROPONINI in the last 168 hours.  BNP (last 3 results) No results for input(s): PROBNP in the last 8760 hours.  HbA1C: No results for input(s): HGBA1C in the last 72 hours.  CBG: No results for input(s): GLUCAP in the last 168 hours.  Lipid Profile: No results  for input(s): CHOL, HDL, LDLCALC, TRIG, CHOLHDL, LDLDIRECT in the last 72 hours.  Thyroid Function Tests: No results for input(s): TSH, T4TOTAL, FREET4, T3FREE, THYROIDAB in the last 72 hours.  Anemia Panel: No results for input(s): VITAMINB12, FOLATE, FERRITIN, TIBC, IRON, RETICCTPCT in the last 72 hours.  Urine analysis:    Component Value Date/Time   COLORURINE AMBER* 01/21/2016 0316   APPEARANCEUR CLOUDY* 01/21/2016 0316   LABSPEC 1.027 01/21/2016 0316   PHURINE 5.0 01/21/2016 0316   GLUCOSEU NEGATIVE 01/21/2016 0316   HGBUR TRACE* 01/21/2016 0316   BILIRUBINUR NEGATIVE 01/21/2016 0316   KETONESUR NEGATIVE 01/21/2016 0316   PROTEINUR NEGATIVE 01/21/2016 0316  NITRITE POSITIVE* 01/21/2016 0316   LEUKOCYTESUR NEGATIVE 01/21/2016 0316    Sepsis Labs: @LABRCNTIP (procalcitonin:4,lacticidven:4) )No results found for this or any previous visit (from the past 240 hour(s)).   Radiological Exams on Admission: Ct Abdomen Pelvis W Contrast  01/21/2016  CLINICAL DATA:  Right-sided abdominal pain EXAM: CT ABDOMEN AND PELVIS WITH CONTRAST TECHNIQUE: Multidetector CT imaging of the abdomen and pelvis was performed using the standard protocol following bolus administration of intravenous contrast. CONTRAST:  152m ISOVUE-300 IOPAMIDOL (ISOVUE-300) INJECTION 61% COMPARISON:  07/27/2010 FINDINGS: Lower chest and abdominal wall: Extensive atherosclerotic calcification in the coronary circulation. Previous ventral hernia repair with mesh. Hepatobiliary: Cirrhotic liver. No focal mass lesion is seen. Portal hypertension with small ascites, mild splenomegaly, and venous collaterals.Occlusive portal thrombus seen in ileal branches with nonocclusive thrombus extending into the SMV and main portal vein. This has the appearance of bland thrombus. Pancreas: Unremarkable. Spleen: Unremarkable. Adrenals/Urinary Tract: Negative adrenals. Partial nephrectomy to the upper pole left kidney where a presumed renal  cell carcinoma was seen previously. Bilateral renal cysts. No hydronephrosis or stone. Unremarkable bladder. Stomach/Bowel: Submucosal edema causes thickening of multiple ileal loops with mesenteric fat congestion. No bowel obstruction. Reproductive:Symmetric prostate enlargement. Vascular/Lymphatic: Portal vein thrombus as noted above. Aortic and iliac atherosclerosis. Mild enlargement of deep liver drainage lymph nodes, presumably reactive to the chronic liver disease. Other: Negative Musculoskeletal: No acute abnormalities. Diffuse disc and facet degeneration. IMPRESSION: 1. 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 Nonocclusive thrombus extends into the SMV and main portal veins. 2. Cirrhosis. 3. Small ascites. 4. Unremarkable appearance of left partial nephrectomy site. Electronically Signed   By: JMonte FantasiaM.D.   On: 01/21/2016 05:23    EKG: Independently reviewed.  Assessment/Plan Active Problems:   Portal vein thrombosis   Diabetes mellitus ([facility])   CAD (coronary artery disease)   Abdominal Pain due to Portal Vein Thrombosis CT A/P: portal vein thrombosis with occlusive ileal vein clot causing congestion/ thickening of multiple ileal loops and non-occlusive thrombus, extending to the SMV and main portal vein .CBC and CMET unremarkable. Risk factors include fam hx stroke, DM, HLD., CAD. He takes daily ASA. No prior history of clots.  Admit  to SDU  IV heparin initiated per Pharmacy . Dilaudid IV for pain and Phenergan prn IV for nausea  IVF  Interventional Radiology and VVS to consult  CAD,  EKG without ACS changes , last cardiac catheterization with stent in 2001 , patient is cardiac pain free at this time. Continue home meds when nausea resolves 2 D echo  Hydralazine for BP 160/90   UTI suggested by +nitrites in urine. Patient is asymptomatic for UTI, denies dysuria or gross hematuria  -  F/u urine culture IV  Rocephin per pharmacy     Abnormal AST/ALT  in the setting of dehydration due to poor oral intake and vomiting due to PVT. No history of  Hepatitis AST/ALT 61/66 nl BiLi at 0.8. No jaundice noted  Repeat CMET in am. If continues to increase, will consult GI  IVF for now    Hyperlipidemia Continue home statins  Type II Diabetes Current blood sugar level is 199 Hgb A1C Hold home oral diabetic medications.   SSI Heart healthy carb modified diet when able to tolerate oral intake    DVT prophylaxis:   Heparin    Code Status:   Full     Family Communication:  Discussed with patient wife husband  Disposition Plan: Expect  patient to be discharged to home after condition improves Consults called:    VVS (Dr. Donnetta Hutching)                                 Interventional Radiology called   Admission status  SDU    Rondel Jumbo, PA-C [facility]   If 7PM-7AM, please contact night-coverage [email or web address] Password [facility]  01/21/2016, 7:43 AM

## 2016-01-21 NOTE — ED Notes (Signed)
Pt states abd pain is increasing again, requesting more pain medication, Dr. Claudine Mouton to be notified.

## 2016-01-21 NOTE — Progress Notes (Signed)
Lactic acid taken at 11:28 was 2.0 Afebrile, WBC normal.  Other parameters not indicative of sepsis.  PAtient is receiving generous IVF. Continue IVF Repeat Lactic acid at 4 pm  Will follow.

## 2016-01-21 NOTE — Progress Notes (Signed)
ANTICOAGULATION CONSULT NOTE  Pharmacy Consult for Heparin Indication: portal vein thrombosis  Allergies  Allergen Reactions  . Penicillins     swelling    Patient Measurements: Height: 5' 11"  (180.3 cm) Weight: 207 lb (93.895 kg) IBW/kg (Calculated) : 75.3 Heparin Dosing Weight: 93 kg  Vital Signs: Temp: 99.2 F (37.3 C) (07/17 1225) Temp Source: Oral (07/17 1225) BP: 147/76 mmHg (07/17 1200) Pulse Rate: 72 (07/17 1200)  Labs:  Recent Labs  01/21/16 0315 01/21/16 1128  HGB 14.4  --   HCT 42.7  --   PLT 173  --   HEPARINUNFRC  --  <0.10*  CREATININE 0.86  --     Estimated Creatinine Clearance: 98.8 mL/min (by C-G formula based on Cr of 0.86).   Assessment: 65 y.o. F presents with abd pain. CT scan shows portal vein thrombosis.  CBC wnl on admission. Started on IV heparin- first level undetectable. No issues with line or infusion per RN. No bleeding noted.  Goal of Therapy:  Heparin level 0.3-0.7 units/ml Monitor platelets by anticoagulation protocol: Yes   Plan:  -re-bolus with 3000 units IV x1, then increase heparin infusion to 1650 units/hr -heparin level in 6h -Daily heparin level and CBC  Kynadi Dragos D. Oddie Kuhlmann, PharmD, BCPS Clinical Pharmacist Pager: [phone] 01/21/2016 12:56 PM

## 2016-01-21 NOTE — ED Notes (Signed)
Attempted report 

## 2016-01-21 NOTE — ED Notes (Signed)
Admitting PA at bedside.

## 2016-01-21 NOTE — Progress Notes (Signed)
ANTICOAGULATION CONSULT NOTE  Pharmacy Consult for Heparin Indication: portal vein thrombosis  Allergies  Allergen Reactions  . Penicillins Swelling    Swelling at injection site Has patient had a PCN reaction causing immediate rash, facial/tongue/throat swelling, SOB or lightheadedness with hypotension: No Has patient had a PCN reaction causing severe rash involving mucus membranes or skin necrosis: No Has patient had a PCN reaction that required hospitalization No Has patient had a PCN reaction occurring within the last 10 years: No If all of the above answers are "NO", then may proceed with Cephalosporin use.    Patient Measurements: Height: 5' 11"  (180.3 cm) Weight: 207 lb (93.895 kg) IBW/kg (Calculated) : 75.3 Heparin Dosing Weight: 93 kg  Vital Signs: Temp: 97.2 F (36.2 C) (07/17 1958) Temp Source: Oral (07/17 1958) BP: 126/72 mmHg (07/17 2000) Pulse Rate: 78 (07/17 2000)  Labs:  Recent Labs  01/21/16 0315 01/21/16 1128 01/21/16 1937  HGB 14.4  --   --   HCT 42.7  --   --   PLT 173  --   --   HEPARINUNFRC  --  <0.10* 0.34  CREATININE 0.86  --   --     Estimated Creatinine Clearance: 98.8 mL/min (by C-G formula based on Cr of 0.86).   Assessment: 65 y.o. F presents with abd pain. CT scan shows portal vein thrombosis. Started on IV heparin - now therapeutic at 0.34. H&H 14.4/42.7, plt 173. No bleeding noted.  Goal of Therapy:  Heparin level 0.3-0.7 units/ml Monitor platelets by anticoagulation protocol: Yes   Plan:  -Continue heparin at 1650 units/hr -Daily heparin level and CBC -Monitor s/sx bleeding  Stephens November, PharmD Clinical Pharmacist  8:15 PM, 01/21/2016

## 2016-01-21 NOTE — Progress Notes (Signed)
CRITICAL VALUE ALERT  Critical value received:1222  Date of notification:  01/21/16  Time of notification: 1324  Critical value read back:2.0  Nurse who received alert: Vita Erm  MD notified (1st page):  1225  Time of first page:  1225  MD notified (2nd page):1300  Time of second page:1300  Responding MD: Judson Roch PA  Time MD responded: 57  Delay in Notification due to attending doctor being incorrectly listed

## 2016-01-21 NOTE — ED Notes (Signed)
Pt states he awoke tonight with severe abd pain, emesis x 1, and abd distension. Pts abd is very very distended and hard to touch.

## 2016-01-21 NOTE — Progress Notes (Signed)
ANTICOAGULATION CONSULT NOTE - Initial Consult  Pharmacy Consult for Heparin Indication: portal vein thrombosis  Allergies  Allergen Reactions  . Penicillins     swelling    Patient Measurements: Height: 5' 11"  (180.3 cm) Weight: 207 lb (93.895 kg) IBW/kg (Calculated) : 75.3 Heparin Dosing Weight: 93 kg  Vital Signs: Temp: 99.1 F (37.3 C) (07/17 0306) BP: 162/82 mmHg (07/17 0530) Pulse Rate: 62 (07/17 0530)  Labs:  Recent Labs  01/21/16 0315  HGB 14.4  HCT 42.7  PLT 173  CREATININE 0.86    Estimated Creatinine Clearance: 98.8 mL/min (by C-G formula based on Cr of 0.86).   Medical History: Past Medical History  Diagnosis Date  . Diabetes mellitus without complication ([place])   . Hypercholesteremia   . Primary cancer of kidney or ureter     Medications:  See electronic med rec  Assessment: 65 y.o. F presents with abd pain. CT scan shows portal vein thrombosis. To begin heparin. CBC ok on admission.  Goal of Therapy:  Heparin level 0.3-0.7 units/ml Monitor platelets by anticoagulation protocol: Yes   Plan:  Heparin IV bolus 4000 units Heparin gtt at 1450 units/hr Will f/u heparin level in 6 hours Daily heparin level and CBC  Sherlon Handing, PharmD, BCPS Clinical pharmacist, pager [phone] 01/21/2016,5:45 AM

## 2016-01-21 NOTE — ED Provider Notes (Signed)
CSN: [id number]     Arrival date & time 01/21/16  0259 History  By signing my name below, I, Jerry [name], attest that this documentation has been prepared under the direction and in the presence of Jerry Balls, MD . Electronically Signed: Rowan [name], Scribe. 01/21/2016. 3:52 AM.  Chief Complaint  Patient presents with  . Abdominal Pain    The history is provided by the patient. No language interpreter was used.   HPI Comments:  Jerry [name] is a 65 y.o. male with PMHx of DM, HLD and kidney cancer who presents to the Emergency Department complaining of abdominal pain and tightness, worse on right, onset upon waking ~2 hours ago. He tried to have a bowel movement without success; he also tried walking without relief. Pt reports 4-5 episodes of emesis with associated nausea. He reports feeling normal earlier today. Pt notes h/o umbilical hernia that was repaired surgically. Denies dysuria, hematuria or fever.  Past Medical History  Diagnosis Date  . Diabetes mellitus without complication ([place])   . Hypercholesteremia   . Primary cancer of kidney or ureter    Past Surgical History  Procedure Laterality Date  . Shoulder surgery    . Stent placement vascular ([facility] hx)     No family history on file. Social History  Substance Use Topics  . Smoking status: Never Smoker   . Smokeless tobacco: None  . Alcohol Use: No    Review of Systems  Constitutional: Negative for fever.  Gastrointestinal: Positive for nausea, vomiting, abdominal pain and constipation.  Genitourinary: Negative for dysuria and hematuria.  All other systems reviewed and are negative.  Allergies  Penicillins  Home Medications   Prior to Admission medications   Not on File   BP 156/116 mmHg  Pulse 61  Temp(Src) 99.1 F (37.3 C)  Resp 22  SpO2 100%   Physical Exam  Constitutional: He is oriented to person, place, and time. Vital signs are normal. He appears well-developed and well-nourished.   Non-toxic appearance. He does not appear ill. No distress. Nasal cannula in place.  Nasal canula in place  HENT:  Head: Normocephalic and atraumatic.  Nose: Nose normal.  Mouth/Throat: Oropharynx is clear and moist. No oropharyngeal exudate.  Eyes: Conjunctivae and EOM are normal. Pupils are equal, round, and reactive to light. No scleral icterus.  Neck: Normal range of motion. Neck supple. No tracheal deviation, no edema, no erythema and normal range of motion present. No thyroid mass and no thyromegaly present.  Cardiovascular: Normal rate, regular rhythm, S1 normal, S2 normal, normal heart sounds, intact distal pulses and normal pulses.  Exam reveals no gallop and no friction rub.   No murmur heard. Pulmonary/Chest: Effort normal and breath sounds normal. No respiratory distress. He has no wheezes. He has no rhonchi. He has no rales.  Abdominal: Soft. Normal appearance and bowel sounds are normal. He exhibits distension. He exhibits no ascites and no mass. There is no hepatosplenomegaly. There is tenderness. There is no rebound, no guarding and no CVA tenderness.  Ventral hernia, easily reducible but TTP; RLQ TTP  Musculoskeletal: Normal range of motion. He exhibits edema. He exhibits no tenderness.  BL LE edema  Lymphadenopathy:    He has no cervical adenopathy.  Neurological: He is alert and oriented to person, place, and time. He has normal strength. No cranial nerve deficit or sensory deficit.  Skin: Skin is warm, dry and intact. No petechiae and no rash noted. He is not diaphoretic. No erythema. No  pallor.  Nursing note and vitals reviewed.   ED Course  Procedures  DIAGNOSTIC STUDIES:  Oxygen Saturation is 100% on RA, normal by my interpretation.    COORDINATION OF CARE:  3:49 AM Will order CT A/P. Discussed treatment plan with pt at bedside and pt agreed to plan.  Labs Review Labs Reviewed  COMPREHENSIVE METABOLIC PANEL - Abnormal; Notable for the following:    Glucose,  Bld 199 (*)    Albumin 3.2 (*)    AST 61 (*)    ALT 66 (*)    All other components within normal limits  URINALYSIS, ROUTINE W REFLEX MICROSCOPIC (NOT AT [facility]) - Abnormal; Notable for the following:    Color, Urine AMBER (*)    APPearance CLOUDY (*)    Hgb urine dipstick TRACE (*)    Nitrite POSITIVE (*)    All other components within normal limits  URINE MICROSCOPIC-ADD ON - Abnormal; Notable for the following:    Squamous Epithelial / LPF 0-5 (*)    Bacteria, UA FEW (*)    All other components within normal limits  LIPASE, BLOOD  CBC    Imaging Review Ct Abdomen Pelvis W Contrast  01/21/2016  CLINICAL DATA:  Right-sided abdominal pain EXAM: CT ABDOMEN AND PELVIS WITH CONTRAST TECHNIQUE: Multidetector CT imaging of the abdomen and pelvis was performed using the standard protocol following bolus administration of intravenous contrast. CONTRAST:  133m ISOVUE-300 IOPAMIDOL (ISOVUE-300) INJECTION 61% COMPARISON:  07/27/2010 FINDINGS: Lower chest and abdominal wall: Extensive atherosclerotic calcification in the coronary circulation. Previous ventral hernia repair with mesh. Hepatobiliary: Cirrhotic liver. No focal mass lesion is seen. Portal hypertension with small ascites, mild splenomegaly, and venous collaterals.Occlusive portal thrombus seen in ileal branches with nonocclusive thrombus extending into the SMV and main portal vein. This has the appearance of bland thrombus. Pancreas: Unremarkable. Spleen: Unremarkable. Adrenals/Urinary Tract: Negative adrenals. Partial nephrectomy to the upper pole left kidney where a presumed renal cell carcinoma was seen previously. Bilateral renal cysts. No hydronephrosis or stone. Unremarkable bladder. Stomach/Bowel: Submucosal edema causes thickening of multiple ileal loops with mesenteric fat congestion. No bowel obstruction. Reproductive:Symmetric prostate enlargement. Vascular/Lymphatic: Portal vein thrombus as noted above. Aortic and iliac  atherosclerosis. Mild enlargement of deep liver drainage lymph nodes, presumably reactive to the chronic liver disease. Other: Negative Musculoskeletal: No acute abnormalities. Diffuse disc and facet degeneration. IMPRESSION: 1. 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 Nonocclusive thrombus extends into the SMV and main portal veins. 2. Cirrhosis. 3. Small ascites. 4. Unremarkable appearance of left partial nephrectomy site. Electronically Signed   By: JMonte FantasiaM.D.   On: 01/21/2016 05:23   I have personally reviewed and evaluated these images and lab results as part of my medical decision-making.   EKG Interpretation   Date/Time:  Monday January 21 2016 03:07:00 EDT Ventricular Rate:  62 PR Interval:  130 QRS Duration: 100 QT Interval:  412 QTC Calculation: 418 R Axis:   -26 Text Interpretation:  Normal sinus rhythm Minimal voltage criteria for  LVH, may be normal variant Borderline ECG No significant change since last  tracing Confirmed by OGlynn [phone] on 01/21/2016 3:42:08 AM      MDM   Final diagnoses:  None    Patient presents to the ED for abd pain, distension and vomiting.  He is tender in the mid epigastrium and RLQ.  His UA suggests UTI.  Patient will benefit from CT scan for further diagnostics.  He was given fentanyl and zofran for his symptoms.  States he feels much better now.   5:27 AM CT reveals portal vein thrombosis.  Patient will require admission for anticoagulation.  I have paged [facility] for admission   CRITICAL CARE Performed by: Jerry [name]   Total critical care time: 35 minutes - portal vein thrombosis requiring heparin gtt  Critical care time was exclusive of separately billable procedures and treating other patients.  Critical care was necessary to treat or prevent imminent or life-threatening deterioration.  Critical care was time spent personally by  me on the following activities: development of treatment plan with patient and/or surrogate as well as nursing, discussions with consultants, evaluation of patient's response to treatment, examination of patient, obtaining history from patient or surrogate, ordering and performing treatments and interventions, ordering and review of laboratory studies, ordering and review of radiographic studies, pulse oximetry and re-evaluation of patient's condition.   I personally performed the services described in this documentation, which was scribed in my presence. The recorded information has been reviewed and is accurate.      Jerry Balls, MD 01/21/16 [phone]

## 2016-01-21 NOTE — Progress Notes (Signed)
CRITICAL VALUE ALERT  Critical value received:  Lactic Acid 2.8    Date of notification:  01/21/2016  Time of notification:  2030  Critical value read back:Yes.    Nurse who received alert:  Gladys Damme, RN  MD notified (1st page):  Lamar Blinks, NP  Time of first page:  2038  MD notified (2nd page):  Time of second page:  Responding MD:  Lamar Blinks, NP  Time MD responded:  2043

## 2016-01-21 NOTE — Progress Notes (Signed)
This is a no charge note  Pending admission per Dr. Claudine Mouton.  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 Patient was found to have portal venous thrombosis with occlusive ileal vein clot, causing congestion/thickening of multiple ileal loops and nonocclusive thrombus extends into the SMV and main portal veins by CT abdomen/pelvis. Hemodynamically stable. No fever, no leukocytosis. IV heparin will be started. Pt is accepted to SDU as in pt.  Ivor Costa, MD  [facility] Pager [phone]  If 7PM-7AM, please contact night-coverage [email or web address] Password [email or web address] 01/21/2016, 5:59 AM

## 2016-01-21 NOTE — Consult Note (Signed)
Patient name: Jerry [name] MRN: [id number] DOB: 05-May-1951 Sex: male   Referred by: [facility]  Reason for referral:  Chief Complaint  Patient presents with  . Abdominal Pain    HISTORY OF PRESENT ILLNESS: 65 year old gentleman in his usual state of health yesterday. At 1 AM he awoke with severe abdominal pain. Initially thought he may need to have a bowel movement. Then felt that maybe he could walk to make things better. He had no relief and presented to the emergency department. He did feel that he had abdominal distention as well. He denies any prior similar symptoms and was normal yesterday. He had no known history of any liver disease or cirrhosis. He does not smoke and is not a alcohol drinker. Does have a history of prior kidney cancer  Past Medical History  Diagnosis Date  . Diabetes mellitus without complication ([place])   . Hypercholesteremia   . Primary cancer of kidney or ureter   . Hypertension     Past Surgical History  Procedure Laterality Date  . Shoulder surgery    . Stent placement vascular ([facility] hx)      Social History   Social History  . Marital Status: Married    Spouse Name: N/A  . Number of Children: N/A  . Years of Education: N/A   Occupational History  . Not on file.   Social History Main Topics  . Smoking status: Never Smoker   . Smokeless tobacco: Not on file  . Alcohol Use: No  . Drug Use: No  . Sexual Activity: Not on file   Other Topics Concern  . Not on file   Social History Narrative  . No narrative on file    Family History  Problem Relation Age of Onset  . Stroke Mother   . Stroke Father     Allergies as of 01/21/2016 - Review Complete 01/21/2016  Allergen Reaction Noted  . Penicillins  01/21/2016    No current facility-administered medications on file prior to encounter.   No current outpatient prescriptions on file prior to encounter.     REVIEW OF SYSTEMS: Reviewed in his history and physical  with nothing to add  PHYSICAL EXAMINATION:  General: The patient is a well-nourished male, moderate distress from abdominal pain Vital signs are BP 157/84 mmHg  Pulse 81  Temp(Src) 99.1 F (37.3 C)  Resp 22  Ht 5' 11"  (1.803 m)  Wt 207 lb (93.895 kg)  BMI 28.88 kg/m2  SpO2 93% Pulmonary: There is a good air exchange  Abdomen: Distended and diffusely tender in all quadrants Musculoskeletal: There are no major deformities.  There is no significant extremity pain. Neurologic: No focal weakness or paresthesias are detected, Skin: There are no ulcer or rashes noted. Psychiatric: The patient has normal affect. Cardiovascular: 2+ radial, 2+ femoral, 2+ popliteal and 2+ dorsalis pedis pulses bilaterally  CT was reviewed. This reveals thrombus in his spare mesenteric vein and portal vein and ileal veins. There is some ascites and characteristics of cirrhosis in his liver.  Impression and Plan:  Agree with anticoagulation. Discussed with the patient unclear the etiology of his cirrhosis and his portal vein occlusion. Explain no role for surgery for his thrombus. Discussed with hospitalist would recommend interventional radiology evaluation for consideration of thrombolytic lysis. Will not follow actively. Please call if we can assist    Jerry [name] [facility] Office: [phone]

## 2016-01-22 ENCOUNTER — Inpatient Hospital Stay (HOSPITAL_COMMUNITY): Payer: Non-veteran care

## 2016-01-22 DIAGNOSIS — E0800 Diabetes mellitus due to underlying condition with hyperosmolarity without nonketotic hyperglycemic-hyperosmolar coma (NKHHC): Secondary | ICD-10-CM

## 2016-01-22 DIAGNOSIS — I81 Portal vein thrombosis: Principal | ICD-10-CM

## 2016-01-22 DIAGNOSIS — I10 Essential (primary) hypertension: Secondary | ICD-10-CM

## 2016-01-22 LAB — CBC
HEMATOCRIT: 43.1 % (ref 39.0–52.0)
HEMOGLOBIN: 14.4 g/dL (ref 13.0–17.0)
MCH: 31 pg (ref 26.0–34.0)
MCHC: 33.4 g/dL (ref 30.0–36.0)
MCV: 92.7 fL (ref 78.0–100.0)
Platelets: 210 10*3/uL (ref 150–400)
RBC: 4.65 MIL/uL (ref 4.22–5.81)
RDW: 13.8 % (ref 11.5–15.5)
WBC: 22.9 10*3/uL — ABNORMAL HIGH (ref 4.0–10.5)

## 2016-01-22 LAB — COMPREHENSIVE METABOLIC PANEL
ALBUMIN: 2.4 g/dL — AB (ref 3.5–5.0)
ALK PHOS: 85 U/L (ref 38–126)
ALT: 44 U/L (ref 17–63)
AST: 32 U/L (ref 15–41)
Anion gap: 6 (ref 5–15)
BILIRUBIN TOTAL: 0.8 mg/dL (ref 0.3–1.2)
BUN: 15 mg/dL (ref 6–20)
CALCIUM: 8 mg/dL — AB (ref 8.9–10.3)
CO2: 26 mmol/L (ref 22–32)
CREATININE: 0.96 mg/dL (ref 0.61–1.24)
Chloride: 109 mmol/L (ref 101–111)
GFR calc Af Amer: >60 mL/min (ref >60)
GFR calc non Af Amer: >60 mL/min (ref >60)
GLUCOSE: 290 mg/dL — AB (ref 65–99)
Potassium: 4 mmol/L (ref 3.5–5.1)
SODIUM: 141 mmol/L (ref 135–145)
TOTAL PROTEIN: 5.8 g/dL — AB (ref 6.5–8.1)

## 2016-01-22 LAB — HEPARIN LEVEL (UNFRACTIONATED): HEPARIN UNFRACTIONATED: 0.32 [IU]/mL (ref 0.30–0.70)

## 2016-01-22 LAB — ECHOCARDIOGRAM COMPLETE
Height: 71 in
Weight: 3312 oz

## 2016-01-22 LAB — GLUCOSE, CAPILLARY
GLUCOSE-CAPILLARY: 232 mg/dL — AB (ref 65–99)
Glucose-Capillary: 210 mg/dL — ABNORMAL HIGH (ref 65–99)
Glucose-Capillary: 181 mg/dL — ABNORMAL HIGH (ref 65–99)
Glucose-Capillary: 162 mg/dL — ABNORMAL HIGH (ref 65–99)

## 2016-01-22 LAB — HEMOGLOBIN A1C
HEMOGLOBIN A1C: 8.9 % — AB (ref 4.8–5.6)
MEAN PLASMA GLUCOSE: 209 mg/dL

## 2016-01-22 LAB — PROTIME-INR
INR: 1.48 (ref 0.00–1.49)
Prothrombin Time: 18 seconds — ABNORMAL HIGH (ref 11.6–15.2)

## 2016-01-22 LAB — LIPASE, BLOOD: Lipase: 20 U/L (ref 11–51)

## 2016-01-22 LAB — LACTIC ACID, PLASMA
LACTIC ACID, VENOUS: 1.9 mmol/L (ref 0.5–1.9)
Lactic Acid, Venous: 2.1 mmol/L (ref 0.5–1.9)

## 2016-01-22 MED ORDER — INSULIN GLARGINE 100 UNIT/ML ~~LOC~~ SOLN: 10.0000 [IU] | Freq: Every day | SUBCUTANEOUS | Status: DC

## 2016-01-22 MED ORDER — INSULIN GLARGINE 100 UNIT/ML ~~LOC~~ SOLN
10.0000 [IU] | Freq: Every day | SUBCUTANEOUS | Status: DC
Start: 2016-01-22 — End: 2016-01-30
  Administered 2016-01-22 – 2016-01-30 (×7): 10 [IU] via SUBCUTANEOUS
  Filled 2016-01-22 (×9): qty 0.1

## 2016-01-22 NOTE — Progress Notes (Signed)
[facility] PROGRESS NOTE  Jerry [name]:[id number] DOB: 1951/06/08 DOA: 01/21/2016   PCP: No primary care provider on file.     Assessment/Plan: Active Problems:   Portal vein thrombosis   Diabetes mellitus (HCC)   CAD (coronary artery disease)   Diabetes mellitus with complication (HCC)   Essential hypertension   Urinary tract infectious disease   65 y.o. male with medical history significant for DM, Hyperlipidemia, Kidney/urether cancer (2001), presenting to the ED with Acute onset of nausea, vomiting and abdominal distention since 1 am.  CT showing SMV thrombus, small portal venous component, and edematous bowel  Assessment and plan    Portal Vein Thrombosis CT A/P: portal vein thrombosis with occlusive ileal vein clot causing congestion/ thickening of multiple ileal loops and non-occlusive thrombus, extending to the SMV and main portal vein .continue stepdown IV heparin initiated per Pharmacy . Dilaudid IV for pain and Phenergan prn IV for nausea  IVF  Interventional Radiology and VVS consulted NPO , and potential intervention by radiology today,plan would be TIPS approach for catheter directed thrombolysis/thrombectomy of SMV thrombus, pressure measurement, and possible TIPS completion   CAD, EKG without ACS changes , last cardiac catheterization with stent in 2001 , patient is cardiac pain free at this time. Continue home meds when nausea resolves 2 D echo  results pending Hydralazine for BP 160/90    UTI suggested by +nitrites in urine. 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 No history of Hepatitis AST/ALT 61/66, now 32/44, bilirubin 0.8 Repeat CMET in am. If continues to increase,      Hyperlipidemia Continue home statins  Type II Diabetes Current blood  sugar level is 199 Hgb A1C pending, hold metformin Hold home oral diabetic medications.  SSI Start Lantus   DVT prophylaxsis  heparin drip  Code Status:  Full code    Family Communication: Discussed in detail with the patient, all imaging results, lab results explained to the patient   Disposition Plan:  Continue stepdown      Consultants:  Interventional radiology  Vascular surgery    Procedures:  None  Antibiotics: Anti-infectives    Start     Dose/Rate Route Frequency Ordered Stop   01/21/16 0900  cefTRIAXone (ROCEPHIN) 1 g in dextrose 5 % 50 mL IVPB     1 g 100 mL/hr over 30 Minutes Intravenous Every 24 hours 01/21/16 0827           HPI/Subjective: Overnight he says he has been passing gas, but no BM. Denies any melena/hematochezia. Reports improving nausea. States he has no appetite  Objective: Filed Vitals:   01/22/16 0700 01/22/16 0815 01/22/16 1000 01/22/16 1100  BP:  138/77 142/84   Pulse: 69 70 72   Temp: 98 F (36.7 C)   98.7 F (37.1 C)  TempSrc: Oral   Oral  Resp: 17 19 16    Height:      Weight:      SpO2: 94% 94% 94%     Intake/Output Summary (Last 24 hours) at 01/22/16 1311 Last data filed at 01/22/16 1200  Gross per 24 hour  Intake 3078.18 ml  Output   1250 ml  Net 1828.18 ml    Exam:  Examination:  General exam: Appears calm and comfortable  Respiratory  system: Clear to auscultation. Respiratory effort normal. Cardiovascular system: S1 & S2 heard, RRR. No JVD, murmurs, rubs, gallops or clicks. No pedal edema. Gastrointestinal system: Abdomen is nondistended, soft and nontender. No organomegaly or masses felt. Normal bowel sounds heard. Central nervous system: Alert and oriented. No focal neurological deficits. Extremities: Symmetric 5 x 5 power. Skin: No rashes, lesions or ulcers Psychiatry: Judgement and insight appear normal. Mood & affect appropriate.     Data Reviewed: I have personally reviewed following  labs and imaging studies  Micro Results Recent Results (from the past 240 hour(s))  MRSA PCR Screening     Status: None   Collection Time: 01/21/16 11:40 AM  Result Value Ref Range Status   MRSA by PCR NEGATIVE NEGATIVE Final    Comment:        The GeneXpert MRSA Assay (FDA approved for NASAL specimens only), is one component of a comprehensive MRSA colonization surveillance program. It is not intended to diagnose MRSA infection nor to guide or monitor treatment for MRSA infections.   Urine culture     Status: Abnormal (Preliminary result)   Collection Time: 01/21/16  2:00 PM  Result Value Ref Range Status   Specimen Description URINE, RANDOM  Final   Special Requests NONE  Final   Culture (A)  Final    40,000 COLONIES/mL STAPHYLOCOCCUS SPECIES (COAGULASE NEGATIVE)   Report Status PENDING  Incomplete    Radiology Reports Ct Abdomen Pelvis W Contrast  01/21/2016  CLINICAL DATA:  Right-sided abdominal pain EXAM: CT ABDOMEN AND PELVIS WITH CONTRAST TECHNIQUE: Multidetector CT imaging of the abdomen and pelvis was performed using the standard protocol following bolus administration of intravenous contrast. CONTRAST:  18m ISOVUE-300 IOPAMIDOL (ISOVUE-300) INJECTION 61% COMPARISON:  07/27/2010 FINDINGS: Lower chest and abdominal wall: Extensive atherosclerotic calcification in the coronary circulation. Previous ventral hernia repair with mesh. Hepatobiliary: Cirrhotic liver. No focal mass lesion is seen. Portal hypertension with small ascites, mild splenomegaly, and venous collaterals.Occlusive portal thrombus seen in ileal branches with nonocclusive thrombus extending into the SMV and main portal vein. This has the appearance of bland thrombus. Pancreas: Unremarkable. Spleen: Unremarkable. Adrenals/Urinary Tract: Negative adrenals. Partial nephrectomy to the upper pole left kidney where a presumed renal cell carcinoma was seen previously. Bilateral renal cysts. No hydronephrosis or stone.  Unremarkable bladder. Stomach/Bowel: Submucosal edema causes thickening of multiple ileal loops with mesenteric fat congestion. No bowel obstruction. Reproductive:Symmetric prostate enlargement. Vascular/Lymphatic: Portal vein thrombus as noted above. Aortic and iliac atherosclerosis. Mild enlargement of deep liver drainage lymph nodes, presumably reactive to the chronic liver disease. Other: Negative Musculoskeletal: No acute abnormalities. Diffuse disc and facet degeneration. IMPRESSION: 1. 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 Nonocclusive thrombus extends into the SMV and main portal veins. 2. Cirrhosis. 3. Small ascites. 4. Unremarkable appearance of left partial nephrectomy site. Electronically Signed   By: JMonte FantasiaM.D.   On: 01/21/2016 05:23     CBC  Recent Labs Lab 01/21/16 0315 01/22/16 0335  WBC 9.7 22.9*  HGB 14.4 14.4  HCT 42.7 43.1  PLT 173 210  MCV 91.8 92.7  MCH 31.0 31.0  MCHC 33.7 33.4  RDW 13.4 13.8    Chemistries   Recent Labs Lab 01/21/16 0315 01/22/16 0335  NA 139 141  K 3.8 4.0  CL 107 109  CO2 26 26  GLUCOSE 199* 290*  BUN 15 15  CREATININE 0.86 0.96  CALCIUM 8.9 8.0*  AST 61* 32  ALT 66* 44  ALKPHOS 126 85  BILITOT 0.8 0.8   ------------------------------------------------------------------------------------------------------------------ estimated creatinine clearance is 88.5 mL/min (by C-G formula based on Cr of 0.96). ------------------------------------------------------------------------------------------------------------------  Recent Labs  01/21/16 1128  HGBA1C 8.9*   ------------------------------------------------------------------------------------------------------------------ No results for input(s): CHOL, HDL, LDLCALC, TRIG, CHOLHDL, LDLDIRECT in the last 72  hours. ------------------------------------------------------------------------------------------------------------------ No results for input(s): TSH, T4TOTAL, T3FREE, THYROIDAB in the last 72 hours.  Invalid input(s): FREET3 ------------------------------------------------------------------------------------------------------------------ No results for input(s): VITAMINB12, FOLATE, FERRITIN, TIBC, IRON, RETICCTPCT in the last 72 hours.  Coagulation profile  Recent Labs Lab 01/22/16 0335  INR 1.48    No results for input(s): DDIMER in the last 72 hours.  Cardiac Enzymes No results for input(s): CKMB, TROPONINI, MYOGLOBIN in the last 168 hours.  Invalid input(s): CK ------------------------------------------------------------------------------------------------------------------ Invalid input(s): POCBNP   CBG:  Recent Labs Lab 01/21/16 1239 01/21/16 1537 01/21/16 2132 01/22/16 0818 01/22/16 1145  GLUCAP 328* 310* 252* 232* 210*       Studies: Ct Abdomen Pelvis W Contrast  01/21/2016  CLINICAL DATA:  Right-sided abdominal pain EXAM: CT ABDOMEN AND PELVIS WITH CONTRAST TECHNIQUE: Multidetector CT imaging of the abdomen and pelvis was performed using the standard protocol following bolus administration of intravenous contrast. CONTRAST:  169m ISOVUE-300 IOPAMIDOL (ISOVUE-300) INJECTION 61% COMPARISON:  07/27/2010 FINDINGS: Lower chest and abdominal wall: Extensive atherosclerotic calcification in the coronary circulation. Previous ventral hernia repair with mesh. Hepatobiliary: Cirrhotic liver. No focal mass lesion is seen. Portal hypertension with small ascites, mild splenomegaly, and venous collaterals.Occlusive portal thrombus seen in ileal branches with nonocclusive thrombus extending into the SMV and main portal vein. This has the appearance of bland thrombus. Pancreas: Unremarkable. Spleen: Unremarkable. Adrenals/Urinary Tract: Negative adrenals. Partial nephrectomy to  the upper pole left kidney where a presumed renal cell carcinoma was seen previously. Bilateral renal cysts. No hydronephrosis or stone. Unremarkable bladder. Stomach/Bowel: Submucosal edema causes thickening of multiple ileal loops with mesenteric fat congestion. No bowel obstruction. Reproductive:Symmetric prostate enlargement. Vascular/Lymphatic: Portal vein thrombus as noted above. Aortic and iliac atherosclerosis. Mild enlargement of deep liver drainage lymph nodes, presumably reactive to the chronic liver disease. Other: Negative Musculoskeletal: No acute abnormalities. Diffuse disc and facet degeneration. IMPRESSION: 1. 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 Nonocclusive thrombus extends into the SMV and main portal veins. 2. Cirrhosis. 3. Small ascites. 4. Unremarkable appearance of left partial nephrectomy site. Electronically Signed   By: JMonte FantasiaM.D.   On: 01/21/2016 05:23      Lab Results  Component Value Date   HGBA1C 8.9* 01/21/2016   Lab Results  Component Value Date   CREATININE 0.96 01/22/2016       Scheduled Meds: . cefTRIAXone (ROCEPHIN)  IV  1 g Intravenous Q24H  . insulin aspart  0-9 Units Subcutaneous TID WC  . insulin glargine  10 Units Subcutaneous Daily  . isosorbide dinitrate  30 mg Oral TID  . lisinopril  5 mg Oral Daily  . sodium chloride flush  3 mL Intravenous Q12H   Continuous Infusions: . sodium chloride 100 mL/hr at 01/22/16 1227  . heparin 1,750 Units/hr (01/22/16 1226)     LOS: 1 day    Time spent: >30 MINS    [facility] Pager [phone] If 7PM-7AM, please contact night-coverage at [email or web address], password [email or web address]7/18/2017, 1:11 PM  LOS: 1 day

## 2016-01-22 NOTE — Progress Notes (Signed)
ANTICOAGULATION CONSULT NOTE  Pharmacy Consult for Heparin Indication: portal vein thrombosis  Allergies  Allergen Reactions  . Penicillins Swelling    Swelling at injection site Has patient had a PCN reaction causing immediate rash, facial/tongue/throat swelling, SOB or lightheadedness with hypotension: No Has patient had a PCN reaction causing severe rash involving mucus membranes or skin necrosis: No Has patient had a PCN reaction that required hospitalization No Has patient had a PCN reaction occurring within the last 10 years: No If all of the above answers are "NO", then may proceed with Cephalosporin use.    Patient Measurements: Height: 5' 11"  (180.3 cm) Weight: 207 lb (93.895 kg) IBW/kg (Calculated) : 75.3 Heparin Dosing Weight: 93 kg  Vital Signs: Temp: 98 F (36.7 C) (07/18 0700) Temp Source: Oral (07/18 0700) BP: 142/84 mmHg (07/18 1000) Pulse Rate: 72 (07/18 1000)  Labs:  Recent Labs  01/21/16 0315 01/21/16 1128 01/21/16 1937 01/22/16 0335  HGB 14.4  --   --  14.4  HCT 42.7  --   --  43.1  PLT 173  --   --  210  LABPROT  --   --   --  18.0*  INR  --   --   --  1.48  HEPARINUNFRC  --  <0.10* 0.34 0.32  CREATININE 0.86  --   --  0.96    Estimated Creatinine Clearance: 88.5 mL/min (by C-G formula based on Cr of 0.96).   Assessment: 65 y.o. F presents with abd pain. CT scan shows portal vein thrombosis.   Started on IV heparin - now therapeutic x2, though both were on lower end of acceptable range.  Hgb and plts stable- no bleeding noted.  Radiology to continue watching for now, plans for TIPS approach for catheter directed thrombolysis/thrombectomy if patient worsens.  Goal of Therapy:  Heparin level 0.3-0.7 units/ml Monitor platelets by anticoagulation protocol: Yes   Plan:  -Increase heparin to 1750 units/hr to ensure level stays in range -Daily heparin level and CBC -Monitor s/sx bleeding  Ruben Pyka D. Fern Asmar, PharmD, BCPS Clinical  Pharmacist Pager: [phone] 01/22/2016 10:17 AM

## 2016-01-22 NOTE — Progress Notes (Signed)
Echocardiogram 2D Echocardiogram has been performed.  Joelene Millin 01/22/2016, 1:46 PM

## 2016-01-22 NOTE — Progress Notes (Signed)
Inpatient Diabetes Program Recommendations  AACE/ADA: New Consensus Statement on Inpatient Glycemic Control (2015)  Target Ranges:  Prepandial:   less than 140 mg/dL      Peak postprandial:   less than 180 mg/dL (1-2 hours)      Critically ill patients:  140 - 180 mg/dL   Results for CASSELL, VOORHIES (MRN [id number]) as of 01/22/2016 10:59  Ref. Range 01/21/2016 12:39 01/21/2016 15:37 01/21/2016 21:32 01/22/2016 08:18  Glucose-Capillary Latest Ref Range: 65-99 mg/dL 328 (H) 310 (H) 252 (H) 232 (H)    Review of Glycemic Control  Diabetes history: DM2 Outpatient Diabetes medications: Glipizide 5 mg QPM, Metformin 1000 mg BID Current orders for Inpatient glycemic control: Novolog 0-9 units TID with meals  Inpatient Diabetes Program Recommendations: Insulin - Basal: Please consider ordering low dose basal insulin; recommend starting with Lantus 9 units Q24H (based on 93 kg x 0.1 units). Insulin-Correction: Please consider ordering bedtime correction scale.  Thanks, Barnie Alderman, RN, MSN, CDE Diabetes Coordinator Inpatient Diabetes Program [phone] (Team Pager from [place] to [place]) [phone] ([place] office) [phone] [facility] office) [phone] [facility] office)

## 2016-01-22 NOTE — Progress Notes (Signed)
Interventional Radiology Progress Note   65 yo male admitted with abd pain, and CT showing SMV thrombus, small portal venous component, and edematous bowel.    Overnight he says he has been passing gas, but no BM.  Denies any melena/hematochezia.  Reports improving nausea.  States he has no appetite.  Reports subjective improvement in his abdominal tenderness.    On exam, he seems to have decreased TTP of the right abdomen compared to yesterday, but still present.  Improved tenderness of the left abd.  Referred pain to the right abdomen on left sided palpation.  Mild distention.    His lactate trend has been 2.0 --> 2.8 --> 2.1 this am at 6am.  He has increased WBC this am to 22.9.    Have discussed our plan with him, which is observation for now.  If he worsens, then the plan would be TIPS approach for catheter directed thrombolysis/thrombectomy of SMV thrombus, pressure measurement, and possible TIPS completion. No consent in chart yet.   Recommendations:  - Repeat lactate at noon. - continue serial abd exam - Will make NPO now in case of need to proceed with treatment - Agree with IV heparin therapy   Signed,  Dulcy Fanny. Earleen Newport, DO

## 2016-01-23 ENCOUNTER — Inpatient Hospital Stay (HOSPITAL_COMMUNITY): Payer: Non-veteran care

## 2016-01-23 LAB — CBC WITH DIFFERENTIAL/PLATELET
Basophils Absolute: 0 10*3/uL (ref 0.0–0.1)
Basophils Relative: 0 %
EOS ABS: 0.1 10*3/uL (ref 0.0–0.7)
EOS PCT: 0 %
HCT: 42 % (ref 39.0–52.0)
Hemoglobin: 13.8 g/dL (ref 13.0–17.0)
LYMPHS ABS: 2.4 10*3/uL (ref 0.7–4.0)
LYMPHS PCT: 14 %
MCH: 30.7 pg (ref 26.0–34.0)
MCHC: 32.9 g/dL (ref 30.0–36.0)
MCV: 93.3 fL (ref 78.0–100.0)
MONO ABS: 1.1 10*3/uL — AB (ref 0.1–1.0)
Monocytes Relative: 6 %
Neutro Abs: 13.7 10*3/uL — ABNORMAL HIGH (ref 1.7–7.7)
Neutrophils Relative %: 80 %
PLATELETS: 193 10*3/uL (ref 150–400)
RBC: 4.5 MIL/uL (ref 4.22–5.81)
RDW: 14.1 % (ref 11.5–15.5)
WBC: 17.3 10*3/uL — AB (ref 4.0–10.5)

## 2016-01-23 LAB — COMPREHENSIVE METABOLIC PANEL
ALBUMIN: 2.5 g/dL — AB (ref 3.5–5.0)
ALK PHOS: 86 U/L (ref 38–126)
ALT: 39 U/L (ref 17–63)
ANION GAP: 7 (ref 5–15)
AST: 30 U/L (ref 15–41)
BUN: 15 mg/dL (ref 6–20)
CALCIUM: 8.1 mg/dL — AB (ref 8.9–10.3)
CO2: 25 mmol/L (ref 22–32)
CREATININE: 0.89 mg/dL (ref 0.61–1.24)
Chloride: 107 mmol/L (ref 101–111)
GFR calc Af Amer: >60 mL/min (ref >60)
GFR calc non Af Amer: >60 mL/min (ref >60)
GLUCOSE: 159 mg/dL — AB (ref 65–99)
Potassium: 3.7 mmol/L (ref 3.5–5.1)
SODIUM: 139 mmol/L (ref 135–145)
Total Bilirubin: 1 mg/dL (ref 0.3–1.2)
Total Protein: 5.9 g/dL — ABNORMAL LOW (ref 6.5–8.1)

## 2016-01-23 LAB — HEPARIN LEVEL (UNFRACTIONATED)
HEPARIN UNFRACTIONATED: 0.26 [IU]/mL — AB (ref 0.30–0.70)
HEPARIN UNFRACTIONATED: 0.36 [IU]/mL (ref 0.30–0.70)
Heparin Unfractionated: 0.27 IU/mL — ABNORMAL LOW (ref 0.30–0.70)

## 2016-01-23 LAB — URINE CULTURE: Culture: 40000 — AB

## 2016-01-23 LAB — GLUCOSE, CAPILLARY
GLUCOSE-CAPILLARY: 142 mg/dL — AB (ref 65–99)
GLUCOSE-CAPILLARY: 132 mg/dL — AB (ref 65–99)
Glucose-Capillary: 124 mg/dL — ABNORMAL HIGH (ref 65–99)
Glucose-Capillary: 104 mg/dL — ABNORMAL HIGH (ref 65–99)

## 2016-01-23 LAB — LACTIC ACID, PLASMA: Lactic Acid, Venous: 2.2 mmol/L (ref 0.5–1.9)

## 2016-01-23 MED ORDER — POTASSIUM CHLORIDE IN NACL 20-0.9 MEQ/L-% IV SOLN
INTRAVENOUS | Status: AC
  Administered 2016-01-23: 100 mL/h via INTRAVENOUS
  Administered 2016-01-23 – 2016-01-26 (×5): via INTRAVENOUS
  Filled 2016-01-23 (×11): qty 1000

## 2016-01-23 MED ORDER — PHENOL 1.4 % MT LIQD
1.0000 | OROMUCOSAL | Status: DC | PRN
Start: 2016-01-23 — End: 2016-01-30
  Administered 2016-01-23: 1 via OROMUCOSAL
  Filled 2016-01-23: qty 177

## 2016-01-23 MED ORDER — CETYLPYRIDINIUM CHLORIDE 0.05 % MT LIQD
7.0000 mL | Freq: Two times a day (BID) | OROMUCOSAL | Status: DC
  Administered 2016-01-23 – 2016-01-28 (×10): 7 mL via OROMUCOSAL

## 2016-01-23 MED ORDER — BISACODYL 10 MG RE SUPP
10.0000 mg | Freq: Once | RECTAL | Status: AC
  Administered 2016-01-23: 10 mg via RECTAL

## 2016-01-23 MED ORDER — SODIUM CHLORIDE 0.9 % IV BOLUS (SEPSIS)
500.0000 mL | Freq: Once | INTRAVENOUS | Status: AC
  Administered 2016-01-23: 500 mL via INTRAVENOUS

## 2016-01-23 MED ORDER — LEVALBUTEROL HCL 0.63 MG/3ML IN NEBU
0.6300 mg | INHALATION_SOLUTION | Freq: Four times a day (QID) | RESPIRATORY_TRACT | Status: DC | PRN
  Administered 2016-01-23: 0.63 mg via RESPIRATORY_TRACT
  Filled 2016-01-23: qty 3

## 2016-01-23 MED ORDER — CHLORHEXIDINE GLUCONATE 0.12 % MT SOLN
15.0000 mL | Freq: Two times a day (BID) | OROMUCOSAL | Status: DC
  Administered 2016-01-23 – 2016-01-30 (×13): 15 mL via OROMUCOSAL
  Filled 2016-01-23 (×14): qty 15

## 2016-01-23 NOTE — Progress Notes (Signed)
ANTICOAGULATION CONSULT NOTE - Follow Up Consult  Pharmacy Consult for Heparin  Indication: Portal Vein Thrombosis  Patient Measurements: Height: 5' 11"  (180.3 cm) Weight: 207 lb (93.895 kg) IBW/kg (Calculated) : 75.3  Vital Signs: Temp: 98.2 F (36.8 C) (07/19 1100) Temp Source: Oral (07/19 1100) BP: 154/59 mmHg (07/19 1200) Pulse Rate: 72 (07/19 1200)  Labs:  Recent Labs  01/21/16 0315  01/22/16 0335 01/23/16 0347 01/23/16 1151  HGB 14.4  --  14.4 13.8  --   HCT 42.7  --  43.1 42.0  --   PLT 173  --  210 193  --   LABPROT  --   --  18.0*  --   --   INR  --   --  1.48  --   --   HEPARINUNFRC  --   < > 0.32 0.27* 0.26*  CREATININE 0.86  --  0.96 0.89  --   < > = values in this interval not displayed.  Estimated Creatinine Clearance: 95.5 mL/min (by C-G formula based on Cr of 0.89).   Assessment: 65 y/o M with portal vein thrombosis on heparin. The heparin level remains below goal on 1850 units/hr. Noted plans  to continue watching for now, plans for TIPS approach for catheter directed thrombolysis/thrombectomy if patient worsens. -HL= 0.26  Goal of Therapy:  Heparin level 0.3-0.7 units/ml Monitor platelets by anticoagulation protocol: Yes   Plan:  -Increase heparin to 2050 units/hr  -Heparin level in 6 hours and daily wth CBC daily  Hildred Laser, Pharm D 01/23/2016 1:44 PM

## 2016-01-23 NOTE — Progress Notes (Signed)
abd xray review, NGT pulled back, placement check via injection of 30cc of air, will continue to monitor.  Sherrie Mustache

## 2016-01-23 NOTE — Progress Notes (Signed)
Referring Physician(s): Reyne Dumas  Supervising Physician: Sandi Mariscal  Patient Status:  Inpatient  Chief Complaint:  Abd pain and bloating CT reveal SMV thrombosis with portal vein component Edematous bowel  Subjective:  Pt says abd pain about same Maybe less but taking pain meds Does feel abd is bloating and worsening No BM; no passing gas now since Sunday pm Right abd is tender as is low abd diffusely Lactate 2.2 today (1.9) Have asked Dr Allyson Sabal to gain surgical consult per Dr Salvadore Farber now UOP 1.3 L yesterday Wbc trending down 17.3 (22.9)   Allergies: Penicillins  Medications: Prior to Admission medications   Medication Sig Start Date End Date Taking? Authorizing Provider  acetaminophen (TYLENOL) 325 MG tablet Take 975 mg by mouth 3 (three) times daily as needed for mild pain or fever.   Yes Historical Provider, MD  alprostadil (MUSE) 1000 MCG pellet 1,000 mcg by Transurethral route See admin instructions. Every 96 hours as needed for erection   Yes Historical Provider, MD  aspirin 81 MG chewable tablet Chew 81 mg by mouth daily.   Yes Historical Provider, MD  carvedilol (COREG) 6.25 MG tablet Take 6.25 mg by mouth 2 (two) times daily with a meal.   Yes Historical Provider, MD  finasteride (PROSCAR) 5 MG tablet Take 5 mg by mouth daily.   Yes Historical Provider, MD  glipiZIDE (GLUCOTROL) 5 MG tablet Take 2.5 mg by mouth 2 (two) times daily before a meal.   Yes Historical Provider, MD  isosorbide dinitrate (ISORDIL) 30 MG tablet Take 30 mg by mouth 3 (three) times daily.   Yes Historical Provider, MD  lisinopril (PRINIVIL,ZESTRIL) 5 MG tablet Take 2.5 mg by mouth daily.   Yes Historical Provider, MD  magnesium oxide (MAG-OX) 400 MG tablet Take 400 mg by mouth daily.   Yes Historical Provider, MD  metFORMIN (GLUCOPHAGE) 1000 MG tablet Take 1,000 mg by mouth 2 (two) times daily with a meal.   Yes Historical Provider, MD  nitroGLYCERIN (NITROSTAT) 0.4 MG SL  tablet Place 0.4 mg under the tongue every 5 (five) minutes as needed for chest pain.   Yes Historical Provider, MD  omeprazole (PRILOSEC) 20 MG capsule Take 20 mg by mouth every evening.   Yes Historical Provider, MD  rosuvastatin (CRESTOR) 10 MG tablet Take 5 mg by mouth at bedtime.   Yes Historical Provider, MD  simethicone (MYLICON) 80 MG chewable tablet Chew 160 mg by mouth 2 (two) times daily as needed for flatulence.   Yes Historical Provider, MD  traMADol (ULTRAM) 50 MG tablet Take 50 mg by mouth 2 (two) times daily as needed for moderate pain.   Yes Historical Provider, MD     Vital Signs: BP 129/65 mmHg  Pulse 64  Temp(Src) 98.1 F (36.7 C) (Oral)  Resp 15  Ht 5' 11"  (1.803 m)  Wt 207 lb (93.895 kg)  BMI 28.88 kg/m2  SpO2 93%  Physical Exam  Constitutional: He is oriented to person, place, and time.  Pulmonary/Chest: Effort normal.  Abdominal: He exhibits distension. There is tenderness.  Rt and low abd tender  Musculoskeletal: Normal range of motion.  Neurological: He is alert and oriented to person, place, and time.  Skin: Skin is warm.  Psychiatric: He has a normal mood and affect. His behavior is normal.  Nursing note and vitals reviewed.   Imaging: Ct Abdomen Pelvis W Contrast  01/21/2016  CLINICAL DATA:  Right-sided abdominal pain EXAM: CT ABDOMEN AND PELVIS WITH CONTRAST TECHNIQUE:  Multidetector CT imaging of the abdomen and pelvis was performed using the standard protocol following bolus administration of intravenous contrast. CONTRAST:  171m ISOVUE-300 IOPAMIDOL (ISOVUE-300) INJECTION 61% COMPARISON:  07/27/2010 FINDINGS: Lower chest and abdominal wall: Extensive atherosclerotic calcification in the coronary circulation. Previous ventral hernia repair with mesh. Hepatobiliary: Cirrhotic liver. No focal mass lesion is seen. Portal hypertension with small ascites, mild splenomegaly, and venous collaterals.Occlusive portal thrombus seen in ileal branches with  nonocclusive thrombus extending into the SMV and main portal vein. This has the appearance of bland thrombus. Pancreas: Unremarkable. Spleen: Unremarkable. Adrenals/Urinary Tract: Negative adrenals. Partial nephrectomy to the upper pole left kidney where a presumed renal cell carcinoma was seen previously. Bilateral renal cysts. No hydronephrosis or stone. Unremarkable bladder. Stomach/Bowel: Submucosal edema causes thickening of multiple ileal loops with mesenteric fat congestion. No bowel obstruction. Reproductive:Symmetric prostate enlargement. Vascular/Lymphatic: Portal vein thrombus as noted above. Aortic and iliac atherosclerosis. Mild enlargement of deep liver drainage lymph nodes, presumably reactive to the chronic liver disease. Other: Negative Musculoskeletal: No acute abnormalities. Diffuse disc and facet degeneration. IMPRESSION: 1. 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 Nonocclusive thrombus extends into the SMV and main portal veins. 2. Cirrhosis. 3. Small ascites. 4. Unremarkable appearance of left partial nephrectomy site. Electronically Signed   By: JMonte FantasiaM.D.   On: 01/21/2016 05:23    Labs:  CBC:  Recent Labs  01/21/16 0315 01/22/16 0335 01/23/16 0347  WBC 9.7 22.9* 17.3*  HGB 14.4 14.4 13.8  HCT 42.7 43.1 42.0  PLT 173 210 193    COAGS:  Recent Labs  01/22/16 0335  INR 1.48    BMP:  Recent Labs  01/21/16 0315 01/22/16 0335 01/23/16 0347  NA 139 141 139  K 3.8 4.0 3.7  CL 107 109 107  CO2 26 26 25   GLUCOSE 199* 290* 159*  BUN 15 15 15   CALCIUM 8.9 8.0* 8.1*  CREATININE 0.86 0.96 0.89  GFRNONAA >60 >60 >60  GFRAA >60 >60 >60    LIVER FUNCTION TESTS:  Recent Labs  01/21/16 0315 01/22/16 0335 01/23/16 0347  BILITOT 0.8 0.8 1.0  AST 61* 32 30  ALT 66* 44 39  ALKPHOS 126 85 86  PROT 7.0 5.8* 5.9*  ALBUMIN 3.2* 2.4* 2.5*    Assessment and Plan:  SMV thrombus On  heparin infusion Surgical consult has been placed (per Dr Wagner--Dr AAllyson Sabalplacing order) Continue Hep and await recommendation from surgery  Electronically Signed: Fawzi Melman A 01/23/2016, 9:53 AM   I spent a total of 15 Minutes at the the patient's bedside AND on the patient's hospital floor or unit, greater than 50% of which was counseling/coordinating care for SMV thrombus

## 2016-01-23 NOTE — Progress Notes (Signed)
Patient ID: Jerry [name], male   DOB: [birth year], 65 y.o.   MRN: [id number] Following from sidelines. Nothing to add from vascular standpoint. General surgery involved regarding bowel issues. Please call if we can assist

## 2016-01-23 NOTE — Progress Notes (Signed)
[facility] PROGRESS NOTE  Jerry [name] TIR:[id number] DOB: 1950-10-06 DOA: 01/21/2016   PCP: No primary care provider on file.     Assessment/Plan: Active Problems:   Portal vein thrombosis   Diabetes mellitus (HCC)   CAD (coronary artery disease)   Diabetes mellitus with complication (HCC)   Essential hypertension   Urinary tract infectious disease   65 y.o. male with medical history significant for DM, Hyperlipidemia, Kidney/urether cancer (2001), presenting to the ED with Acute onset of nausea, vomiting and abdominal distention since 1 am.  CT showing SMV thrombus, small portal venous component, and edematous bowel  Assessment and plan    Portal Vein Thrombosis CT A/P: portal vein thrombosis with occlusive ileal vein clot causing congestion/ thickening of multiple ileal loops and non-occlusive thrombus, extending to the SMV and main portal vein .continue stepdown IV heparin initiated per Pharmacy . Dilaudid IV for pain and Phenergan prn IV for nausea  IVF  Interventional Radiology and VVS consulted ,no role for surgery for his thrombus, he recommended interventional radiology evaluation for consideration of thrombolytic lysis NPO , and potential intervention by radiology today,plan would be TIPS approach for catheter directed thrombolysis/thrombectomy of SMV thrombus, pressure measurement, and possible TIPS completion Interventional radiology recommended surgical consultation today   CAD, EKG without ACS changes , last cardiac catheterization with stent in 2001 , patient is cardiac pain free at this time. Continue home meds when nausea resolves 2 D echo  shows EF of 67-61%, grade 1 diastolic dysfunction Hydralazine for BP 160/90    UTI suggested by +nitrites in urine. 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 No history of Hepatitis AST/ALT 61/66, now 30/39, bilirubin  1.0 Lipase 20        Hyperlipidemia Continue home statins  Type II Diabetes Current blood sugar level is 199 Hgb A1C 8.9, hold metformin Hold home oral diabetic medications.  SSI Start Lantus   DVT prophylaxsis  heparin drip  Code Status:  Full code    Family Communication: Discussed in detail with the patient, all imaging results, lab results explained to the patient   Disposition Plan:  Transfer to telemetry, general surgery consultation      Consultants:  Interventional radiology  Vascular surgery  General surgery    Procedures:  None  Antibiotics: Anti-infectives    Start     Dose/Rate Route Frequency Ordered Stop   01/21/16 0900  cefTRIAXone (ROCEPHIN) 1 g in dextrose 5 % 50 mL IVPB     1 g 100 mL/hr over 30 Minutes Intravenous Every 24 hours 01/21/16 0827           HPI/Subjective: No BM; no passing gas now since Sunday pm Right abd is tender as is low abd diffusely  Objective: Filed Vitals:   01/23/16 0700 01/23/16 0800 01/23/16 1000 01/23/16 1100  BP:  129/65 143/97   Pulse:  64 71   Temp: 98.1 F (36.7 C)   98.2 F (36.8 C)  TempSrc: Oral   Oral  Resp:  15 21   Height:      Weight:      SpO2:  93% 97%     Intake/Output Summary (Last 24 hours) at 01/23/16 1206 Last  data filed at 01/23/16 1100  Gross per 24 hour  Intake 3198.55 ml  Output   1450 ml  Net 1748.55 ml    Exam:  Examination:  General exam: Appears calm and comfortable  Respiratory system: Clear to auscultation. Respiratory effort normal. Cardiovascular system: S1 & S2 heard, RRR. No JVD, murmurs, rubs, gallops or clicks. No pedal edema. Gastrointestinal system: Abdomen is nondistended, soft and nontender. No organomegaly or masses felt. Normal bowel sounds heard. Central  nervous system: Alert and oriented. No focal neurological deficits. Extremities: Symmetric 5 x 5 power. Skin: No rashes, lesions or ulcers Psychiatry: Judgement and insight appear normal. Mood & affect appropriate.     Data Reviewed: I have personally reviewed following labs and imaging studies  Micro Results Recent Results (from the past 240 hour(s))  Culture, blood (Routine X 2) w Reflex to ID Panel     Status: None (Preliminary result)   Collection Time: 01/21/16 11:37 AM  Result Value Ref Range Status   Specimen Description BLOOD LEFT HAND  Final   Special Requests IN PEDIATRIC BOTTLE 3CC  Final   Culture NO GROWTH 1 DAY  Final   Report Status PENDING  Incomplete  MRSA PCR Screening     Status: None   Collection Time: 01/21/16 11:40 AM  Result Value Ref Range Status   MRSA by PCR NEGATIVE NEGATIVE Final    Comment:        The GeneXpert MRSA Assay (FDA approved for NASAL specimens only), is one component of a comprehensive MRSA colonization surveillance program. It is not intended to diagnose MRSA infection nor to guide or monitor treatment for MRSA infections.   Culture, blood (Routine X 2) w Reflex to ID Panel     Status: None (Preliminary result)   Collection Time: 01/21/16 11:42 AM  Result Value Ref Range Status   Specimen Description RIGHT ANTECUBITAL  Final   Special Requests BOTTLES DRAWN AEROBIC ONLY 5CC  Final   Culture NO GROWTH 1 DAY  Final   Report Status PENDING  Incomplete  Urine culture     Status: Abnormal   Collection Time: 01/21/16  2:00 PM  Result Value Ref Range Status   Specimen Description URINE, RANDOM  Final   Special Requests NONE  Final   Culture (A)  Final    40,000 COLONIES/mL STAPHYLOCOCCUS SPECIES (COAGULASE NEGATIVE)   Report Status 01/23/2016 FINAL  Final   Organism ID, Bacteria STAPHYLOCOCCUS SPECIES (COAGULASE NEGATIVE) (A)  Final      Susceptibility   Staphylococcus species (coagulase negative) - MIC*    CIPROFLOXACIN <=0.5  SENSITIVE Sensitive     GENTAMICIN <=0.5 SENSITIVE Sensitive     NITROFURANTOIN <=16 SENSITIVE Sensitive     OXACILLIN <=0.25 SENSITIVE Sensitive     TETRACYCLINE <=1 SENSITIVE Sensitive     VANCOMYCIN 1 SENSITIVE Sensitive     TRIMETH/SULFA <=10 SENSITIVE Sensitive     CLINDAMYCIN <=0.25 SENSITIVE Sensitive     RIFAMPIN <=0.5 SENSITIVE Sensitive     Inducible Clindamycin NEGATIVE Sensitive     * 40,000 COLONIES/mL STAPHYLOCOCCUS SPECIES (COAGULASE NEGATIVE)    Radiology Reports Ct Abdomen Pelvis W Contrast  01/21/2016  CLINICAL DATA:  Right-sided abdominal pain EXAM: CT ABDOMEN AND PELVIS WITH CONTRAST TECHNIQUE: Multidetector CT imaging of the abdomen and pelvis was performed using the standard protocol following bolus administration of intravenous contrast. CONTRAST:  151m ISOVUE-300 IOPAMIDOL (ISOVUE-300) INJECTION 61% COMPARISON:  07/27/2010 FINDINGS: Lower chest and abdominal wall: Extensive atherosclerotic calcification in the coronary  circulation. Previous ventral hernia repair with mesh. Hepatobiliary: Cirrhotic liver. No focal mass lesion is seen. Portal hypertension with small ascites, mild splenomegaly, and venous collaterals.Occlusive portal thrombus seen in ileal branches with nonocclusive thrombus extending into the SMV and main portal vein. This has the appearance of bland thrombus. Pancreas: Unremarkable. Spleen: Unremarkable. Adrenals/Urinary Tract: Negative adrenals. Partial nephrectomy to the upper pole left kidney where a presumed renal cell carcinoma was seen previously. Bilateral renal cysts. No hydronephrosis or stone. Unremarkable bladder. Stomach/Bowel: Submucosal edema causes thickening of multiple ileal loops with mesenteric fat congestion. No bowel obstruction. Reproductive:Symmetric prostate enlargement. Vascular/Lymphatic: Portal vein thrombus as noted above. Aortic and iliac atherosclerosis. Mild enlargement of deep liver drainage lymph nodes, presumably reactive to  the chronic liver disease. Other: Negative Musculoskeletal: No acute abnormalities. Diffuse disc and facet degeneration. IMPRESSION: 1. 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 Nonocclusive thrombus extends into the SMV and main portal veins. 2. Cirrhosis. 3. Small ascites. 4. Unremarkable appearance of left partial nephrectomy site. Electronically Signed   By: Monte Fantasia M.D.   On: 01/21/2016 05:23     CBC  Recent Labs Lab 01/21/16 0315 01/22/16 0335 01/23/16 0347  WBC 9.7 22.9* 17.3*  HGB 14.4 14.4 13.8  HCT 42.7 43.1 42.0  PLT 173 210 193  MCV 91.8 92.7 93.3  MCH 31.0 31.0 30.7  MCHC 33.7 33.4 32.9  RDW 13.4 13.8 14.1  LYMPHSABS  --   --  2.4  MONOABS  --   --  1.1*  EOSABS  --   --  0.1  BASOSABS  --   --  0.0    Chemistries   Recent Labs Lab 01/21/16 0315 01/22/16 0335 01/23/16 0347  NA 139 141 139  K 3.8 4.0 3.7  CL 107 109 107  CO2 26 26 25   GLUCOSE 199* 290* 159*  BUN 15 15 15   CREATININE 0.86 0.96 0.89  CALCIUM 8.9 8.0* 8.1*  AST 61* 32 30  ALT 66* 44 39  ALKPHOS 126 85 86  BILITOT 0.8 0.8 1.0   ------------------------------------------------------------------------------------------------------------------ estimated creatinine clearance is 95.5 mL/min (by C-G formula based on Cr of 0.89). ------------------------------------------------------------------------------------------------------------------  Recent Labs  01/21/16 1128  HGBA1C 8.9*   ------------------------------------------------------------------------------------------------------------------ No results for input(s): CHOL, HDL, LDLCALC, TRIG, CHOLHDL, LDLDIRECT in the last 72 hours. ------------------------------------------------------------------------------------------------------------------ No results for input(s): TSH, T4TOTAL, T3FREE, THYROIDAB in the last 72 hours.  Invalid input(s):  FREET3 ------------------------------------------------------------------------------------------------------------------ No results for input(s): VITAMINB12, FOLATE, FERRITIN, TIBC, IRON, RETICCTPCT in the last 72 hours.  Coagulation profile  Recent Labs Lab 01/22/16 0335  INR 1.48    No results for input(s): DDIMER in the last 72 hours.  Cardiac Enzymes No results for input(s): CKMB, TROPONINI, MYOGLOBIN in the last 168 hours.  Invalid input(s): CK ------------------------------------------------------------------------------------------------------------------ Invalid input(s): POCBNP   CBG:  Recent Labs Lab 01/22/16 0818 01/22/16 1145 01/22/16 1615 01/22/16 2147 01/23/16 0808  GLUCAP 232* 210* 181* 162* 142*       Studies: No results found.    Lab Results  Component Value Date   HGBA1C 8.9* 01/21/2016   Lab Results  Component Value Date   CREATININE 0.89 01/23/2016       Scheduled Meds: . antiseptic oral rinse  7 mL Mouth Rinse q12n4p  . cefTRIAXone (ROCEPHIN)  IV  1 g Intravenous Q24H  . chlorhexidine  15 mL Mouth Rinse BID  . insulin aspart  0-9 Units Subcutaneous TID WC  . insulin  glargine  10 Units Subcutaneous Daily  . isosorbide dinitrate  30 mg Oral TID  . lisinopril  5 mg Oral Daily  . sodium chloride flush  3 mL Intravenous Q12H   Continuous Infusions: . sodium chloride 100 mL/hr at 01/23/16 1100  . heparin 1,850 Units/hr (01/23/16 1100)     LOS: 2 days    Time spent: >30 MINS    [facility] Pager [phone]. If 7PM-7AM, please contact night-coverage at [email or web address], password [facility] 01/23/2016, 12:06 PM  LOS: 2 days

## 2016-01-23 NOTE — Progress Notes (Signed)
CRITICAL VALUE ALERT  Critical value received:  Lactic acid   Date of notification:  01/23/16  Time of notification:  0520  Critical value read back: yes  Nurse who received alert:  Siri Cole, RN  MD notified (1st page):  Raliegh Ip Schorr  Time of first page:  [phone] text page  MD notified (2nd page):  Time of second page:  Responding MD:  Lamar Blinks  Time MD responded:  [phone]

## 2016-01-23 NOTE — Progress Notes (Signed)
[facility] PROGRESS NOTE  GUS LITTLER UUV:[id number] DOB: 10-Apr-1951 DOA: 01/21/2016   PCP: No primary care provider on file.     Assessment/Plan: Active Problems:   Portal vein thrombosis   Diabetes mellitus (HCC)   CAD (coronary artery disease)   Diabetes mellitus with complication (HCC)   Essential hypertension   Urinary tract infectious disease   65 y.o. male with medical history significant for DM, Hyperlipidemia, Kidney/urether cancer (2001), presenting to the ED with Acute onset of nausea, vomiting and abdominal distention since 1 am.  CT showing SMV thrombus, small portal venous component, and edematous bowel  Assessment and plan    Portal Vein Thrombosis CT A/P: portal vein thrombosis with occlusive ileal vein clot causing congestion/ thickening of multiple ileal loops and non-occlusive thrombus, extending to the SMV and main portal vein .continue stepdown IV heparin initiated per Pharmacy . Dilaudid IV for pain and Phenergan prn IV for nausea  IVF  Interventional Radiology and VVS consulted ,no role for surgery for his thrombus, he recommended interventional radiology evaluation for consideration of thrombolytic lysis NPO , and potential intervention by radiology today,plan would be TIPS approach for catheter directed thrombolysis/thrombectomy of SMV thrombus, pressure measurement, and possible TIPS completion Interventional radiology recommended surgical consultation today, likely has ileus    CAD, EKG without ACS changes , last cardiac catheterization with stent in 2001 , patient is cardiac pain free at this time. Continue home meds when nausea resolves 2 D echo  shows EF of 47-42%, grade 1 diastolic dysfunction Hydralazine for BP 160/90    UTI suggested by +nitrites in urine. 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 No history of Hepatitis AST/ALT 61/66, now 30/39, bilirubin  1.0 Lipase 20        Hyperlipidemia Continue home statins  Type II Diabetes Current blood sugar level is 199 Hgb A1C 8.9, hold metformin Hold home oral diabetic medications.  SSI Start Lantus   DVT prophylaxsis  heparin drip  Code Status:  Full code    Family Communication: Discussed in detail with the patient, all imaging results, lab results explained to the patient   Disposition Plan:  Transfer to telemetry, general surgery consultation      Consultants:  Interventional radiology  Vascular surgery  General surgery    Procedures:  None  Antibiotics: Anti-infectives    Start     Dose/Rate Route Frequency Ordered Stop   01/21/16 0900  cefTRIAXone (ROCEPHIN) 1 g in dextrose 5 % 50 mL IVPB     1 g 100 mL/hr over 30 Minutes Intravenous Every 24 hours 01/21/16 0827           HPI/Subjective: No BM; no passing gas now since Sunday pm Right abd is tender as is low abd diffusely  Objective: Filed Vitals:   01/23/16 0800 01/23/16 1000 01/23/16 1100 01/23/16 1200  BP: 129/65 143/97  154/59  Pulse: 64 71  72  Temp:   98.2 F (36.8 C)   TempSrc:   Oral   Resp: 15 21  21   Height:      Weight:      SpO2: 93% 97%  95%    Intake/Output Summary (Last 24 hours) at 01/23/16 1325  Last data filed at 01/23/16 1100  Gross per 24 hour  Intake 2981.05 ml  Output    850 ml  Net 2131.05 ml    Exam:  Examination:  General exam: Appears calm and comfortable  Respiratory system: Clear to auscultation. Respiratory effort normal. Cardiovascular system: S1 & S2 heard, RRR. No JVD, murmurs, rubs, gallops or clicks. No pedal edema. Gastrointestinal system: Abdomen is nondistended, soft and nontender. No organomegaly or masses felt. Normal bowel sounds  heard. Central nervous system: Alert and oriented. No focal neurological deficits. Extremities: Symmetric 5 x 5 power. Skin: No rashes, lesions or ulcers Psychiatry: Judgement and insight appear normal. Mood & affect appropriate.     Data Reviewed: I have personally reviewed following labs and imaging studies  Micro Results Recent Results (from the past 240 hour(s))  Culture, blood (Routine X 2) w Reflex to ID Panel     Status: None (Preliminary result)   Collection Time: 01/21/16 11:37 AM  Result Value Ref Range Status   Specimen Description BLOOD LEFT HAND  Final   Special Requests IN PEDIATRIC BOTTLE 3CC  Final   Culture NO GROWTH 1 DAY  Final   Report Status PENDING  Incomplete  MRSA PCR Screening     Status: None   Collection Time: 01/21/16 11:40 AM  Result Value Ref Range Status   MRSA by PCR NEGATIVE NEGATIVE Final    Comment:        The GeneXpert MRSA Assay (FDA approved for NASAL specimens only), is one component of a comprehensive MRSA colonization surveillance program. It is not intended to diagnose MRSA infection nor to guide or monitor treatment for MRSA infections.   Culture, blood (Routine X 2) w Reflex to ID Panel     Status: None (Preliminary result)   Collection Time: 01/21/16 11:42 AM  Result Value Ref Range Status   Specimen Description RIGHT ANTECUBITAL  Final   Special Requests BOTTLES DRAWN AEROBIC ONLY 5CC  Final   Culture NO GROWTH 1 DAY  Final   Report Status PENDING  Incomplete  Urine culture     Status: Abnormal   Collection Time: 01/21/16  2:00 PM  Result Value Ref Range Status   Specimen Description URINE, RANDOM  Final   Special Requests NONE  Final   Culture (A)  Final    40,000 COLONIES/mL STAPHYLOCOCCUS SPECIES (COAGULASE NEGATIVE)   Report Status 01/23/2016 FINAL  Final   Organism ID, Bacteria STAPHYLOCOCCUS SPECIES (COAGULASE NEGATIVE) (A)  Final      Susceptibility   Staphylococcus species (coagulase negative) - MIC*     CIPROFLOXACIN <=0.5 SENSITIVE Sensitive     GENTAMICIN <=0.5 SENSITIVE Sensitive     NITROFURANTOIN <=16 SENSITIVE Sensitive     OXACILLIN <=0.25 SENSITIVE Sensitive     TETRACYCLINE <=1 SENSITIVE Sensitive     VANCOMYCIN 1 SENSITIVE Sensitive     TRIMETH/SULFA <=10 SENSITIVE Sensitive     CLINDAMYCIN <=0.25 SENSITIVE Sensitive     RIFAMPIN <=0.5 SENSITIVE Sensitive     Inducible Clindamycin NEGATIVE Sensitive     * 40,000 COLONIES/mL STAPHYLOCOCCUS SPECIES (COAGULASE NEGATIVE)    Radiology Reports Ct Abdomen Pelvis W Contrast  01/21/2016  CLINICAL DATA:  Right-sided abdominal pain EXAM: CT ABDOMEN AND PELVIS WITH CONTRAST TECHNIQUE: Multidetector CT imaging of the abdomen and pelvis was performed using the standard protocol following bolus administration of intravenous contrast. CONTRAST:  179m ISOVUE-300 IOPAMIDOL (ISOVUE-300) INJECTION 61% COMPARISON:  07/27/2010 FINDINGS: Lower chest and abdominal wall: Extensive atherosclerotic calcification in  the coronary circulation. Previous ventral hernia repair with mesh. Hepatobiliary: Cirrhotic liver. No focal mass lesion is seen. Portal hypertension with small ascites, mild splenomegaly, and venous collaterals.Occlusive portal thrombus seen in ileal branches with nonocclusive thrombus extending into the SMV and main portal vein. This has the appearance of bland thrombus. Pancreas: Unremarkable. Spleen: Unremarkable. Adrenals/Urinary Tract: Negative adrenals. Partial nephrectomy to the upper pole left kidney where a presumed renal cell carcinoma was seen previously. Bilateral renal cysts. No hydronephrosis or stone. Unremarkable bladder. Stomach/Bowel: Submucosal edema causes thickening of multiple ileal loops with mesenteric fat congestion. No bowel obstruction. Reproductive:Symmetric prostate enlargement. Vascular/Lymphatic: Portal vein thrombus as noted above. Aortic and iliac atherosclerosis. Mild enlargement of deep liver drainage lymph nodes,  presumably reactive to the chronic liver disease. Other: Negative Musculoskeletal: No acute abnormalities. Diffuse disc and facet degeneration. IMPRESSION: 1. 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 Nonocclusive thrombus extends into the SMV and main portal veins. 2. Cirrhosis. 3. Small ascites. 4. Unremarkable appearance of left partial nephrectomy site. Electronically Signed   By: Monte Fantasia M.D.   On: 01/21/2016 05:23     CBC  Recent Labs Lab 01/21/16 0315 01/22/16 0335 01/23/16 0347  WBC 9.7 22.9* 17.3*  HGB 14.4 14.4 13.8  HCT 42.7 43.1 42.0  PLT 173 210 193  MCV 91.8 92.7 93.3  MCH 31.0 31.0 30.7  MCHC 33.7 33.4 32.9  RDW 13.4 13.8 14.1  LYMPHSABS  --   --  2.4  MONOABS  --   --  1.1*  EOSABS  --   --  0.1  BASOSABS  --   --  0.0    Chemistries   Recent Labs Lab 01/21/16 0315 01/22/16 0335 01/23/16 0347  NA 139 141 139  K 3.8 4.0 3.7  CL 107 109 107  CO2 26 26 25   GLUCOSE 199* 290* 159*  BUN 15 15 15   CREATININE 0.86 0.96 0.89  CALCIUM 8.9 8.0* 8.1*  AST 61* 32 30  ALT 66* 44 39  ALKPHOS 126 85 86  BILITOT 0.8 0.8 1.0   ------------------------------------------------------------------------------------------------------------------ estimated creatinine clearance is 95.5 mL/min (by C-G formula based on Cr of 0.89). ------------------------------------------------------------------------------------------------------------------  Recent Labs  01/21/16 1128  HGBA1C 8.9*   ------------------------------------------------------------------------------------------------------------------ No results for input(s): CHOL, HDL, LDLCALC, TRIG, CHOLHDL, LDLDIRECT in the last 72 hours. ------------------------------------------------------------------------------------------------------------------ No results for input(s): TSH, T4TOTAL, T3FREE, THYROIDAB in the last 72 hours.  Invalid  input(s): FREET3 ------------------------------------------------------------------------------------------------------------------ No results for input(s): VITAMINB12, FOLATE, FERRITIN, TIBC, IRON, RETICCTPCT in the last 72 hours.  Coagulation profile  Recent Labs Lab 01/22/16 0335  INR 1.48    No results for input(s): DDIMER in the last 72 hours.  Cardiac Enzymes No results for input(s): CKMB, TROPONINI, MYOGLOBIN in the last 168 hours.  Invalid input(s): CK ------------------------------------------------------------------------------------------------------------------ Invalid input(s): POCBNP   CBG:  Recent Labs Lab 01/22/16 1145 01/22/16 1615 01/22/16 2147 01/23/16 0808 01/23/16 1202  GLUCAP 210* 181* 162* 142* 132*       Studies: No results found.    Lab Results  Component Value Date   HGBA1C 8.9* 01/21/2016   Lab Results  Component Value Date   CREATININE 0.89 01/23/2016       Scheduled Meds: . antiseptic oral rinse  7 mL Mouth Rinse q12n4p  . cefTRIAXone (ROCEPHIN)  IV  1 g Intravenous Q24H  . chlorhexidine  15 mL Mouth Rinse BID  . insulin aspart  0-9 Units Subcutaneous TID WC  .  insulin glargine  10 Units Subcutaneous Daily  . isosorbide dinitrate  30 mg Oral TID  . lisinopril  5 mg Oral Daily  . sodium chloride flush  3 mL Intravenous Q12H   Continuous Infusions: . 0.9 % NaCl with KCl 20 mEq / L 100 mL/hr at 01/23/16 1309  . heparin 1,850 Units/hr (01/23/16 1100)     LOS: 2 days    Time spent: >30 MINS    [facility] Pager [phone]. If 7PM-7AM, please contact night-coverage at [email or web address], password [facility] 01/23/2016, 1:25 PM  LOS: 2 days

## 2016-01-23 NOTE — Consult Note (Signed)
[facility] Surgery Consult Note  Jerry [name] [birth year]  [id number].    Requesting MD: Dr. Reyne Dumas Chief Complaint/Reason for Consult: Small Bowel Edema   HPI:  65 year-old male with PMH DM2, Cirrhosis, HL, CAD s/p stent placement (2001), and cancer of the Kidney/ureter (2001) who presented to [facility] with acute nausea, vomiting, and abdominal distention on 01/21/16. Pain was described as severe, constant, 10/10 pain that started at 1:00 AM. He denies similar pain in the past. Denies use of EtOH/tobacco/drugs or changes in diet. Takes daily ASA, no other blood thinning medications. 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 Lactate level was 2.0 on admission, WBC 9.7. The patient was admitted by internal medicine to the SDU, placed on heparin, ceftriaxone, and vascular and interventional radiology asked to consult. Vascular surgery determined no surgical intervention for the patients clot was necessary. Interventional radiology suggested IV heparin therapy and serial lactate levels/abd exams with possible catheter directed thrombolysis/TIPS procedure should patient condition worsen. 01/22/16 patients lactate was 2.1 and WBC up to 22.9. IR recommended general surgery consultation should the patient decompensate secondary to bowel necrosis. Patients abdominal pain today is 4/10 on pain medications and patient states he is "unable to sit up" secondary to pain. Does not feel the pain is improving. Pain is worse with movement. Complains of SOB and "shakiness". Denies flatus or BM since admission. Denies fever, chills,CP.  Lactic acid 2.2 today. AST/ALT/t.bili/alk phos are normal WBC 17.3  Past abdominal surgeries: umbilical hernia repair with mesh, appendectomy  ROS: All systems reviewed and otherwise negative except for as above  Family History  Problem Relation Age of Onset  .  Stroke Mother   . Stroke Father     Past Medical History  Diagnosis Date  . Diabetes mellitus without complication ([place])   . Hypercholesteremia   . Primary cancer of kidney or ureter   . Hypertension     Past Surgical History  Procedure Laterality Date  . Shoulder surgery    . Stent placement vascular ([facility] hx)      Social History:  reports that he has never smoked. He does not have any smokeless tobacco history on file. He reports that he does not drink alcohol or use illicit drugs.  Allergies:  Allergies  Allergen Reactions  . Penicillins Swelling    Swelling at injection site Has patient had a PCN reaction causing immediate rash, facial/tongue/throat swelling, SOB or lightheadedness with hypotension: No Has patient had a PCN reaction causing severe rash involving mucus membranes or skin necrosis: No Has patient had a PCN reaction that required hospitalization No Has patient had a PCN reaction occurring within the last 10 years: No If all of the above answers are "NO", then may proceed with Cephalosporin use.    Medications Prior to Admission  Medication Sig Dispense Refill  . acetaminophen (TYLENOL) 325 MG tablet Take 975 mg by mouth 3 (three) times daily as needed for mild pain or fever.    Marland Kitchen alprostadil (MUSE) 1000 MCG pellet 1,000 mcg by Transurethral route See admin instructions. Every 96 hours as needed for erection    . aspirin 81 MG chewable tablet Chew 81 mg by mouth daily.    . carvedilol (COREG) 6.25 MG tablet Take 6.25 mg by mouth 2 (two) times daily with a meal.    . finasteride (PROSCAR) 5 MG tablet Take 5 mg by mouth daily.    Marland Kitchen  glipiZIDE (GLUCOTROL) 5 MG tablet Take 2.5 mg by mouth 2 (two) times daily before a meal.    . isosorbide dinitrate (ISORDIL) 30 MG tablet Take 30 mg by mouth 3 (three) times daily.    Marland Kitchen lisinopril (PRINIVIL,ZESTRIL) 5 MG tablet Take 2.5 mg by mouth daily.    . magnesium oxide (MAG-OX) 400 MG tablet Take 400 mg by mouth daily.    .  metFORMIN (GLUCOPHAGE) 1000 MG tablet Take 1,000 mg by mouth 2 (two) times daily with a meal.    . nitroGLYCERIN (NITROSTAT) 0.4 MG SL tablet Place 0.4 mg under the tongue every 5 (five) minutes as needed for chest pain.    Marland Kitchen omeprazole (PRILOSEC) 20 MG capsule Take 20 mg by mouth every evening.    . rosuvastatin (CRESTOR) 10 MG tablet Take 5 mg by mouth at bedtime.    . simethicone (MYLICON) 80 MG chewable tablet Chew 160 mg by mouth 2 (two) times daily as needed for flatulence.    . traMADol (ULTRAM) 50 MG tablet Take 50 mg by mouth 2 (two) times daily as needed for moderate pain.      Blood pressure 154/59, pulse 72, temperature 98.2 F (36.8 C), temperature source Oral, resp. rate 21, height _0  (1.803 m), weight 93.895 kg (207 lb), SpO2 95 %. Physical Exam: General: pleasant, obese white male who is laying in bed in NAD HEENT: head is normocephalic, atraumatic.  Sclera are noninjected. Mouth is dry. Nasal cannula in place. Heart: regular, rate, and rhythm.  No obvious murmurs, gallops, or rubs noted.  Palpable pedal pulses bilaterally Lungs: CTAB, no wheezes, rhonchi, or rales noted.  Respiratory effort mildly labored Abd: soft, extremely TTP RLQ, Referred pain from LLQ to RLQ, very distended, hypoactive BS, no masses, hernias, or organomegaly. No peritonitis.  MS: all 4 extremities are symmetrical with no cyanosis, clubbing, or edema. Skin: warm and dry with no masses, lesions, or rashes Psych: A&Ox3 with an appropriate affect. Neuro: CM 2-12 intact, extremity CSM intact bilaterally, normal speech  Results for orders placed or performed during the hospital encounter of 01/21/16 (from the past 48 hour(s))  Urine culture     Status: Abnormal   Collection Time: 01/21/16  2:00 PM  Result Value Ref Range   Specimen Description URINE, RANDOM    Special Requests NONE    Culture (A)     40,000 COLONIES/mL STAPHYLOCOCCUS SPECIES (COAGULASE NEGATIVE)   Report Status 01/23/2016 FINAL     Organism ID, Bacteria STAPHYLOCOCCUS SPECIES (COAGULASE NEGATIVE) (A)       Susceptibility   Staphylococcus species (coagulase negative) - MIC*    CIPROFLOXACIN <=0.5 SENSITIVE Sensitive     GENTAMICIN <=0.5 SENSITIVE Sensitive     NITROFURANTOIN <=16 SENSITIVE Sensitive     OXACILLIN <=0.25 SENSITIVE Sensitive     TETRACYCLINE <=1 SENSITIVE Sensitive     VANCOMYCIN 1 SENSITIVE Sensitive     TRIMETH/SULFA <=10 SENSITIVE Sensitive     CLINDAMYCIN <=0.25 SENSITIVE Sensitive     RIFAMPIN <=0.5 SENSITIVE Sensitive     Inducible Clindamycin NEGATIVE Sensitive     * 40,000 COLONIES/mL STAPHYLOCOCCUS SPECIES (COAGULASE NEGATIVE)  Glucose, capillary     Status: Abnormal   Collection Time: 01/21/16  3:37 PM  Result Value Ref Range   Glucose-Capillary 310 (H) 65 - 99 mg/dL   Comment 1 Capillary Specimen    Comment 2 Notify RN   Heparin level (unfractionated)     Status: None   Collection Time: 01/21/16  7:37  PM  Result Value Ref Range   Heparin Unfractionated 0.34 0.30 - 0.70 IU/mL    Comment:        IF HEPARIN RESULTS ARE BELOW EXPECTED VALUES, AND PATIENT DOSAGE HAS BEEN CONFIRMED, SUGGEST FOLLOW UP TESTING OF ANTITHROMBIN III LEVELS.   Lactic acid, plasma     Status: Abnormal   Collection Time: 01/21/16  7:37 PM  Result Value Ref Range   Lactic Acid, Venous 2.8 (HH) 0.5 - 1.9 mmol/L    Comment: CRITICAL RESULT CALLED TO, READ BACK BY AND VERIFIED WITH: FREI,L RN _0  BY GRINSTEAD,C 7.17.17   Glucose, capillary     Status: Abnormal   Collection Time: 01/21/16  9:32 PM  Result Value Ref Range   Glucose-Capillary 252 (H) 65 - 99 mg/dL   Comment 1 Capillary Specimen    Comment 2 Notify RN    Comment 3 Document in Chart   Lipase, blood     Status: None   Collection Time: 01/22/16  3:30 AM  Result Value Ref Range   Lipase 20 11 - 51 U/L  Heparin level (unfractionated)     Status: None   Collection Time: 01/22/16  3:35 AM  Result Value Ref Range   Heparin Unfractionated 0.32  0.30 - 0.70 IU/mL    Comment:        IF HEPARIN RESULTS ARE BELOW EXPECTED VALUES, AND PATIENT DOSAGE HAS BEEN CONFIRMED, SUGGEST FOLLOW UP TESTING OF ANTITHROMBIN III LEVELS.   CBC     Status: Abnormal   Collection Time: 01/22/16  3:35 AM  Result Value Ref Range   WBC 22.9 (H) 4.0 - 10.5 K/uL   RBC 4.65 4.22 - 5.81 MIL/uL   Hemoglobin 14.4 13.0 - 17.0 g/dL   HCT 43.1 39.0 - 52.0 %   MCV 92.7 78.0 - 100.0 fL   MCH 31.0 26.0 - 34.0 pg   MCHC 33.4 30.0 - 36.0 g/dL   RDW 13.8 11.5 - 15.5 %   Platelets 210 150 - 400 K/uL  Comprehensive metabolic panel     Status: Abnormal   Collection Time: 01/22/16  3:35 AM  Result Value Ref Range   Sodium 141 135 - 145 mmol/L   Potassium 4.0 3.5 - 5.1 mmol/L   Chloride 109 101 - 111 mmol/L   CO2 26 22 - 32 mmol/L   Glucose, Bld 290 (H) 65 - 99 mg/dL   BUN 15 6 - 20 mg/dL   Creatinine, Ser 0.96 0.61 - 1.24 mg/dL   Calcium 8.0 (L) 8.9 - 10.3 mg/dL   Total Protein 5.8 (L) 6.5 - 8.1 g/dL   Albumin 2.4 (L) 3.5 - 5.0 g/dL   AST 32 15 - 41 U/L   ALT 44 17 - 63 U/L   Alkaline Phosphatase 85 38 - 126 U/L   Total Bilirubin 0.8 0.3 - 1.2 mg/dL   GFR calc non Af Amer >60 >60 mL/min   GFR calc Af Amer >60 >60 mL/min    Comment: (NOTE) The eGFR has been calculated using the CKD EPI equation. This calculation has not been validated in all clinical situations. eGFR's persistently <60 mL/min signify possible Chronic Kidney Disease.    Anion gap 6 5 - 15  Protime-INR     Status: Abnormal   Collection Time: 01/22/16  3:35 AM  Result Value Ref Range   Prothrombin Time 18.0 (H) 11.6 - 15.2 seconds   INR 1.48 0.00 - 1.49  Lactic acid, plasma     Status: Abnormal  Collection Time: 01/22/16  6:32 AM  Result Value Ref Range   Lactic Acid, Venous 2.1 (HH) 0.5 - 1.9 mmol/L    Comment: CRITICAL RESULT CALLED TO, READ BACK BY AND VERIFIED WITH: L.FREI,RN 01/22/16 0709 BY BSLADE   Glucose, capillary     Status: Abnormal   Collection Time: 01/22/16  8:18 AM   Result Value Ref Range   Glucose-Capillary 232 (H) 65 - 99 mg/dL   Comment 1 Notify RN   Glucose, capillary     Status: Abnormal   Collection Time: 01/22/16 11:45 AM  Result Value Ref Range   Glucose-Capillary 210 (H) 65 - 99 mg/dL   Comment 1 Notify RN   Lactic acid, plasma     Status: None   Collection Time: 01/22/16 11:58 AM  Result Value Ref Range   Lactic Acid, Venous 1.9 0.5 - 1.9 mmol/L  Glucose, capillary     Status: Abnormal   Collection Time: 01/22/16  4:15 PM  Result Value Ref Range   Glucose-Capillary 181 (H) 65 - 99 mg/dL  Glucose, capillary     Status: Abnormal   Collection Time: 01/22/16  9:47 PM  Result Value Ref Range   Glucose-Capillary 162 (H) 65 - 99 mg/dL   Comment 1 Capillary Specimen    Comment 2 Notify RN    Comment 3 Document in Chart   Heparin level (unfractionated)     Status: Abnormal   Collection Time: 01/23/16  3:47 AM  Result Value Ref Range   Heparin Unfractionated 0.27 (L) 0.30 - 0.70 IU/mL    Comment:        IF HEPARIN RESULTS ARE BELOW EXPECTED VALUES, AND PATIENT DOSAGE HAS BEEN CONFIRMED, SUGGEST FOLLOW UP TESTING OF ANTITHROMBIN III LEVELS.   Comprehensive metabolic panel     Status: Abnormal   Collection Time: 01/23/16  3:47 AM  Result Value Ref Range   Sodium 139 135 - 145 mmol/L   Potassium 3.7 3.5 - 5.1 mmol/L   Chloride 107 101 - 111 mmol/L   CO2 25 22 - 32 mmol/L   Glucose, Bld 159 (H) 65 - 99 mg/dL   BUN 15 6 - 20 mg/dL   Creatinine, Ser 0.89 0.61 - 1.24 mg/dL   Calcium 8.1 (L) 8.9 - 10.3 mg/dL   Total Protein 5.9 (L) 6.5 - 8.1 g/dL   Albumin 2.5 (L) 3.5 - 5.0 g/dL   AST 30 15 - 41 U/L   ALT 39 17 - 63 U/L   Alkaline Phosphatase 86 38 - 126 U/L   Total Bilirubin 1.0 0.3 - 1.2 mg/dL   GFR calc non Af Amer >60 >60 mL/min   GFR calc Af Amer >60 >60 mL/min    Comment: (NOTE) The eGFR has been calculated using the CKD EPI equation. This calculation has not been validated in all clinical situations. eGFR's persistently  <60 mL/min signify possible Chronic Kidney Disease.    Anion gap 7 5 - 15  Lactic acid, plasma     Status: Abnormal   Collection Time: 01/23/16  3:47 AM  Result Value Ref Range   Lactic Acid, Venous 2.2 (HH) 0.5 - 1.9 mmol/L    Comment: CRITICAL RESULT CALLED TO, READ BACK BY AND VERIFIED WITH: WARLICK,M RN 22/48/2500 0523 JORDANS   CBC with Differential/Platelet     Status: Abnormal   Collection Time: 01/23/16  3:47 AM  Result Value Ref Range   WBC 17.3 (H) 4.0 - 10.5 K/uL   RBC 4.50 4.22 - 5.81  MIL/uL   Hemoglobin 13.8 13.0 - 17.0 g/dL   HCT 42.0 39.0 - 52.0 %   MCV 93.3 78.0 - 100.0 fL   MCH 30.7 26.0 - 34.0 pg   MCHC 32.9 30.0 - 36.0 g/dL   RDW 14.1 11.5 - 15.5 %   Platelets 193 150 - 400 K/uL   Neutrophils Relative % 80 %   Neutro Abs 13.7 (H) 1.7 - 7.7 K/uL   Lymphocytes Relative 14 %   Lymphs Abs 2.4 0.7 - 4.0 K/uL   Monocytes Relative 6 %   Monocytes Absolute 1.1 (H) 0.1 - 1.0 K/uL   Eosinophils Relative 0 %   Eosinophils Absolute 0.1 0.0 - 0.7 K/uL   Basophils Relative 0 %   Basophils Absolute 0.0 0.0 - 0.1 K/uL  Glucose, capillary     Status: Abnormal   Collection Time: 01/23/16  8:08 AM  Result Value Ref Range   Glucose-Capillary 142 (H) 65 - 99 mg/dL   Comment 1 Notify RN   Heparin level (unfractionated)     Status: Abnormal   Collection Time: 01/23/16 11:51 AM  Result Value Ref Range   Heparin Unfractionated 0.26 (L) 0.30 - 0.70 IU/mL    Comment:        IF HEPARIN RESULTS ARE BELOW EXPECTED VALUES, AND PATIENT DOSAGE HAS BEEN CONFIRMED, SUGGEST FOLLOW UP TESTING OF ANTITHROMBIN III LEVELS.   Glucose, capillary     Status: Abnormal   Collection Time: 01/23/16 12:02 PM  Result Value Ref Range   Glucose-Capillary 132 (H) 65 - 99 mg/dL   Comment 1 Notify RN    No results found.    Assessment/Plan Abdominal Pain  Portal vein thombosis - SMV thrombus, edematous bowel on CT - on IV heparin per pharmacy dosing, IR following for possible TIPS  procedure  - pain control   CAD - no CP or EKG changes, on home meds. EF 60-65% with Grade 1 diastolic dysfunction Cirrhosis - IV Rocephin UTI Abnormal AST/ALT -resolved Diabetes, Type II - SSI  FEN: NPO DVT proph: heparin ID: Ceftriaxone day #3 (UTI)  Plan: NG decompression and continue IV heparin with serial abdominal exams/labs vs. OR for exploratory laparotomy, will discuss with MD.  Jill Alexanders, [facility] Surgery 01/23/2016, 12:56 PM Pager: [phone] Consults: [phone] Mon-Fri 7:00 am-4:30 pm Sat-Sun 7:00 am-11:30 am

## 2016-01-23 NOTE — Progress Notes (Signed)
ANTICOAGULATION CONSULT NOTE - Follow Up Consult  Pharmacy Consult for Heparin  Indication: Portal Vein Thrombosis  Patient Measurements: Height: 5' 11"  (180.3 cm) Weight: 207 lb (93.895 kg) IBW/kg (Calculated) : 75.3  Vital Signs: Temp: 98.2 F (36.8 C) (07/19 0400) Temp Source: Oral (07/19 0400) BP: 146/69 mmHg (07/19 0400) Pulse Rate: 73 (07/19 0400)  Labs:  Recent Labs  01/21/16 0315  01/21/16 1937 01/22/16 0335 01/23/16 0347  HGB 14.4  --   --  14.4 13.8  HCT 42.7  --   --  43.1 42.0  PLT 173  --   --  210 193  LABPROT  --   --   --  18.0*  --   INR  --   --   --  1.48  --   HEPARINUNFRC  --   < > 0.34 0.32 0.27*  CREATININE 0.86  --   --  0.96  --   < > = values in this interval not displayed.  Estimated Creatinine Clearance: 88.5 mL/min (by C-G formula based on Cr of 0.96).   Assessment: 65 y/o M with portal vein thrombosis, heparin level slightly low this AM, CBC stable  Goal of Therapy:  Heparin level 0.3-0.7 units/ml Monitor platelets by anticoagulation protocol: Yes   Plan:  -Increase heparin to 1850 units/hr -1300 HL  Jerry [name] 01/23/2016,4:55 AM

## 2016-01-23 NOTE — Care Management Important Message (Signed)
Important Message  Patient Details  Name: Jerry [name] MRN: [id number] Date of Birth: 1951/04/28   Medicare Important Message Given:  Yes    Nathen May 01/23/2016, 10:25 AM

## 2016-01-23 NOTE — Progress Notes (Signed)
ANTICOAGULATION CONSULT NOTE  Pharmacy Consult for Heparin  Indication: Portal Vein Thrombosis  Patient Measurements: Height: 5' 11"  (180.3 cm) Weight: 207 lb (93.895 kg) IBW/kg (Calculated) : 75.3  Vital Signs: Temp: 98.9 F (37.2 C) (07/19 1900) Temp Source: Oral (07/19 1900) BP: 171/86 mmHg (07/19 1800) Pulse Rate: 73 (07/19 1800)  Labs:  Recent Labs  01/21/16 0315  01/22/16 0335 01/23/16 0347 01/23/16 1151 01/23/16 1959  HGB 14.4  --  14.4 13.8  --   --   HCT 42.7  --  43.1 42.0  --   --   PLT 173  --  210 193  --   --   LABPROT  --   --  18.0*  --   --   --   INR  --   --  1.48  --   --   --   HEPARINUNFRC  --   < > 0.32 0.27* 0.26* 0.36  CREATININE 0.86  --  0.96 0.89  --   --   < > = values in this interval not displayed.  Estimated Creatinine Clearance: 95.5 mL/min (by C-G formula based on Cr of 0.89).   Assessment: 65 y/o M with portal vein thrombosis on heparin. The heparin level remains below goal on 1850 units/hr. Noted plans  to continue watching for now, plans for TIPS approach for catheter directed thrombolysis/thrombectomy if patient worsens.  HL therapeutic.  Goal of Therapy:  Heparin level 0.3-0.7 units/ml Monitor platelets by anticoagulation protocol: Yes   Plan:  -Continue heparin at 2050 units/hr -Daily HL, CBC     Hughes Better, PharmD, BCPS Clinical Pharmacist 01/23/2016 9:00 PM

## 2016-01-24 DIAGNOSIS — E118 Type 2 diabetes mellitus with unspecified complications: Secondary | ICD-10-CM

## 2016-01-24 LAB — COMPREHENSIVE METABOLIC PANEL
ALK PHOS: 75 U/L (ref 38–126)
ALT: 33 U/L (ref 17–63)
ANION GAP: 3 — AB (ref 5–15)
AST: 28 U/L (ref 15–41)
Albumin: 2.4 g/dL — ABNORMAL LOW (ref 3.5–5.0)
BILIRUBIN TOTAL: 0.8 mg/dL (ref 0.3–1.2)
BUN: 13 mg/dL (ref 6–20)
CALCIUM: 8.1 mg/dL — AB (ref 8.9–10.3)
CO2: 24 mmol/L (ref 22–32)
CREATININE: 0.76 mg/dL (ref 0.61–1.24)
Chloride: 113 mmol/L — ABNORMAL HIGH (ref 101–111)
GFR calc non Af Amer: >60 mL/min (ref >60)
GLUCOSE: 133 mg/dL — AB (ref 65–99)
Potassium: 3.8 mmol/L (ref 3.5–5.1)
SODIUM: 140 mmol/L (ref 135–145)
TOTAL PROTEIN: 5.7 g/dL — AB (ref 6.5–8.1)

## 2016-01-24 LAB — CBC
HEMATOCRIT: 40.7 % (ref 39.0–52.0)
HEMOGLOBIN: 13.4 g/dL (ref 13.0–17.0)
MCH: 30.5 pg (ref 26.0–34.0)
MCHC: 32.9 g/dL (ref 30.0–36.0)
MCV: 92.5 fL (ref 78.0–100.0)
Platelets: 167 10*3/uL (ref 150–400)
RBC: 4.4 MIL/uL (ref 4.22–5.81)
RDW: 13.9 % (ref 11.5–15.5)
WBC: 11.2 10*3/uL — ABNORMAL HIGH (ref 4.0–10.5)

## 2016-01-24 LAB — GLUCOSE, CAPILLARY
GLUCOSE-CAPILLARY: 166 mg/dL — AB (ref 65–99)
Glucose-Capillary: 126 mg/dL — ABNORMAL HIGH (ref 65–99)
Glucose-Capillary: 130 mg/dL — ABNORMAL HIGH (ref 65–99)
Glucose-Capillary: 239 mg/dL — ABNORMAL HIGH (ref 65–99)

## 2016-01-24 LAB — HEPARIN LEVEL (UNFRACTIONATED)
Heparin Unfractionated: 0.31 IU/mL (ref 0.30–0.70)
Heparin Unfractionated: 0.29 IU/mL — ABNORMAL LOW (ref 0.30–0.70)

## 2016-01-24 MED ORDER — HYDRALAZINE HCL 20 MG/ML IJ SOLN: 10.0000 mg | Freq: Four times a day (QID) | INTRAMUSCULAR | Status: DC | PRN

## 2016-01-24 NOTE — Progress Notes (Signed)
Referring Physician(s): Dr Reyne Dumas  Supervising Physician: Sandi Mariscal  Patient Status:  Inpatient  Chief Complaint:  SMV thrombus abd pain   Subjective:  So much better today Passing gas "Possible BM today" abd much less painful even to touch Happier today  Allergies: Penicillins  Medications: Prior to Admission medications   Medication Sig Start Date End Date Taking? Authorizing Provider  acetaminophen (TYLENOL) 325 MG tablet Take 975 mg by mouth 3 (three) times daily as needed for mild pain or fever.   Yes Historical Provider, MD  alprostadil (MUSE) 1000 MCG pellet 1,000 mcg by Transurethral route See admin instructions. Every 96 hours as needed for erection   Yes Historical Provider, MD  aspirin 81 MG chewable tablet Chew 81 mg by mouth daily.   Yes Historical Provider, MD  carvedilol (COREG) 6.25 MG tablet Take 6.25 mg by mouth 2 (two) times daily with a meal.   Yes Historical Provider, MD  finasteride (PROSCAR) 5 MG tablet Take 5 mg by mouth daily.   Yes Historical Provider, MD  glipiZIDE (GLUCOTROL) 5 MG tablet Take 2.5 mg by mouth 2 (two) times daily before a meal.   Yes Historical Provider, MD  isosorbide dinitrate (ISORDIL) 30 MG tablet Take 30 mg by mouth 3 (three) times daily.   Yes Historical Provider, MD  lisinopril (PRINIVIL,ZESTRIL) 5 MG tablet Take 2.5 mg by mouth daily.   Yes Historical Provider, MD  magnesium oxide (MAG-OX) 400 MG tablet Take 400 mg by mouth daily.   Yes Historical Provider, MD  metFORMIN (GLUCOPHAGE) 1000 MG tablet Take 1,000 mg by mouth 2 (two) times daily with a meal.   Yes Historical Provider, MD  nitroGLYCERIN (NITROSTAT) 0.4 MG SL tablet Place 0.4 mg under the tongue every 5 (five) minutes as needed for chest pain.   Yes Historical Provider, MD  omeprazole (PRILOSEC) 20 MG capsule Take 20 mg by mouth every evening.   Yes Historical Provider, MD  rosuvastatin (CRESTOR) 10 MG tablet Take 5 mg by mouth at bedtime.   Yes  Historical Provider, MD  simethicone (MYLICON) 80 MG chewable tablet Chew 160 mg by mouth 2 (two) times daily as needed for flatulence.   Yes Historical Provider, MD  traMADol (ULTRAM) 50 MG tablet Take 50 mg by mouth 2 (two) times daily as needed for moderate pain.   Yes Historical Provider, MD     Vital Signs: BP 167/75 mmHg  Pulse 69  Temp(Src) 99.1 F (37.3 C) (Oral)  Resp 20  Ht 5' 11"  (1.803 m)  Wt 207 lb (93.895 kg)  BMI 28.88 kg/m2  SpO2 96%  Physical Exam  Abdominal: Bowel sounds are normal. He exhibits distension. There is tenderness.  Much less tense Much less pain RLQ and low abd  Neurological: He is alert.  Skin: Skin is warm and dry.  Nursing note and vitals reviewed.   Imaging: Ct Abdomen Pelvis W Contrast  01/21/2016  CLINICAL DATA:  Right-sided abdominal pain EXAM: CT ABDOMEN AND PELVIS WITH CONTRAST TECHNIQUE: Multidetector CT imaging of the abdomen and pelvis was performed using the standard protocol following bolus administration of intravenous contrast. CONTRAST:  12m ISOVUE-300 IOPAMIDOL (ISOVUE-300) INJECTION 61% COMPARISON:  07/27/2010 FINDINGS: Lower chest and abdominal wall: Extensive atherosclerotic calcification in the coronary circulation. Previous ventral hernia repair with mesh. Hepatobiliary: Cirrhotic liver. No focal mass lesion is seen. Portal hypertension with small ascites, mild splenomegaly, and venous collaterals.Occlusive portal thrombus seen in ileal branches with nonocclusive thrombus extending into the SMV  and main portal vein. This has the appearance of bland thrombus. Pancreas: Unremarkable. Spleen: Unremarkable. Adrenals/Urinary Tract: Negative adrenals. Partial nephrectomy to the upper pole left kidney where a presumed renal cell carcinoma was seen previously. Bilateral renal cysts. No hydronephrosis or stone. Unremarkable bladder. Stomach/Bowel: Submucosal edema causes thickening of multiple ileal loops with mesenteric fat congestion. No  bowel obstruction. Reproductive:Symmetric prostate enlargement. Vascular/Lymphatic: Portal vein thrombus as noted above. Aortic and iliac atherosclerosis. Mild enlargement of deep liver drainage lymph nodes, presumably reactive to the chronic liver disease. Other: Negative Musculoskeletal: No acute abnormalities. Diffuse disc and facet degeneration. IMPRESSION: 1. 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 Nonocclusive thrombus extends into the SMV and main portal veins. 2. Cirrhosis. 3. Small ascites. 4. Unremarkable appearance of left partial nephrectomy site. Electronically Signed   By: Monte Fantasia M.D.   On: 01/21/2016 05:23   Dg Abd Portable 1v  01/23/2016  CLINICAL DATA:  NG tube placement EXAM: PORTABLE ABDOMEN - 1 VIEW COMPARISON:  CT scan from 2 days ago. FINDINGS: NG tube tip is transpyloric, in the distal descending duodenum. NG tube could be pulled back 9-10 cm to place the tip in the distal stomach. There is mild gaseous distention of small bowel and colon. IMPRESSION: NG tube tip is transpyloric. Electronically Signed   By: Misty Stanley M.D.   On: 01/23/2016 17:21    Labs:  CBC:  Recent Labs  01/21/16 0315 01/22/16 0335 01/23/16 0347 01/24/16 0248  WBC 9.7 22.9* 17.3* 11.2*  HGB 14.4 14.4 13.8 13.4  HCT 42.7 43.1 42.0 40.7  PLT 173 210 193 167    COAGS:  Recent Labs  01/22/16 0335  INR 1.48    BMP:  Recent Labs  01/21/16 0315 01/22/16 0335 01/23/16 0347 01/24/16 0248  NA 139 141 139 140  K 3.8 4.0 3.7 3.8  CL 107 109 107 113*  CO2 26 26 25 24   GLUCOSE 199* 290* 159* 133*  BUN 15 15 15 13   CALCIUM 8.9 8.0* 8.1* 8.1*  CREATININE 0.86 0.96 0.89 0.76  GFRNONAA >60 >60 >60 >60  GFRAA >60 >60 >60 >60    LIVER FUNCTION TESTS:  Recent Labs  01/21/16 0315 01/22/16 0335 01/23/16 0347 01/24/16 0248  BILITOT 0.8 0.8 1.0 0.8  AST 61* 32 30 28  ALT 66* 44 39 33  ALKPHOS 126 85 86 75    PROT 7.0 5.8* 5.9* 5.7*  ALBUMIN 3.2* 2.4* 2.5* 2.4*    Assessment and Plan:  abd pain SMV thrombus Still Hep infusion Appreciate CCS consultation   Electronically Signed: Andi Layfield A 01/24/2016, 8:37 AM   I spent a total of 15 Minutes at the the patient's bedside AND on the patient's hospital floor or unit, greater than 50% of which was counseling/coordinating care for abd pain

## 2016-01-24 NOTE — Progress Notes (Signed)
  Subjective: Feels much better, abd pain much better, passing flatus  Objective: Vital signs in last 24 hours: Temp:  [98.2 F (36.8 C)-98.9 F (37.2 C)] 98.5 F (36.9 C) (07/19 2335) Pulse Rate:  [64-87] 70 (07/20 0700) Resp:  [15-26] 17 (07/20 0700) BP: (129-177)/(59-97) 154/64 mmHg (07/20 0600) SpO2:  [93 %-97 %] 95 % (07/20 0700) Last BM Date: 01/23/16  Intake/Output from previous day: 07/19 0701 - 07/20 0700 In: 2863.5 [P.O.:30; I.V.:2663.5; NG/GT:120; IV Piggyback:50] Out: 2575 [Urine:1375; Emesis/NG output:1200] Intake/Output this shift:    GI: soft minimally tender right side bs present much less distended  Lab Results:   Recent Labs  01/23/16 0347 01/24/16 0248  WBC 17.3* 11.2*  HGB 13.8 13.4  HCT 42.0 40.7  PLT 193 167   BMET  Recent Labs  01/23/16 0347 01/24/16 0248  NA 139 140  K 3.7 3.8  CL 107 113*  CO2 25 24  GLUCOSE 159* 133*  BUN 15 13  CREATININE 0.89 0.76  CALCIUM 8.1* 8.1*   PT/INR  Recent Labs  01/22/16 0335  LABPROT 18.0*  INR 1.48   Studies/Results: Dg Abd Portable 1v  01/23/2016  CLINICAL DATA:  NG tube placement EXAM: PORTABLE ABDOMEN - 1 VIEW COMPARISON:  CT scan from 2 days ago. FINDINGS: NG tube tip is transpyloric, in the distal descending duodenum. NG tube could be pulled back 9-10 cm to place the tip in the distal stomach. There is mild gaseous distention of small bowel and colon. IMPRESSION: NG tube tip is transpyloric. Electronically Signed   By: Misty Stanley M.D.   On: 01/23/2016 17:21    Anti-infectives: Anti-infectives    Start     Dose/Rate Route Frequency Ordered Stop   01/21/16 0900  cefTRIAXone (ROCEPHIN) 1 g in dextrose 5 % 50 mL IVPB     1 g 100 mL/hr over 30 Minutes Intravenous Every 24 hours 01/21/16 0827        Assessment/Plan: Portal vein thombosis - SMV thrombus, edematous bowel on CT-this is much better clinically today, he has return of bowel function, will dc ng tube, would leave npo until  tomorrow, continue anticoagulation, continue abx for uti/bowel  [facility] 01/24/2016

## 2016-01-24 NOTE — Progress Notes (Addendum)
ANTICOAGULATION CONSULT NOTE  Pharmacy Consult for Heparin  Indication: Portal Vein Thrombosis  Patient Measurements: Height: 5' 11"  (180.3 cm) Weight: 207 lb (93.895 kg) IBW/kg (Calculated) : 75.3  Vital Signs: Temp: 99.1 F (37.3 C) (07/20 0754) Temp Source: Oral (07/20 0754) BP: 167/75 mmHg (07/20 0800) Pulse Rate: 69 (07/20 0800)  Labs:  Recent Labs  01/22/16 0335 01/23/16 0347 01/23/16 1151 01/23/16 1959 01/24/16 0248  HGB 14.4 13.8  --   --  13.4  HCT 43.1 42.0  --   --  40.7  PLT 210 193  --   --  167  LABPROT 18.0*  --   --   --   --   INR 1.48  --   --   --   --   HEPARINUNFRC 0.32 0.27* 0.26* 0.36 0.31  CREATININE 0.96 0.89  --   --  0.76    Estimated Creatinine Clearance: 106.2 mL/min (by C-G formula based on Cr of 0.76).   Assessment: 65 y/o M with portal vein thrombosis on heparin. HL slightly therapeutic at 0.31. Noted plans to continue watching for now, plans for TIPS approach for catheter directed thrombolysis/thrombectomy if patient worsens.  HL slightly therapeutic at 0.31.  Goal of Therapy:  Heparin level 0.3-0.7 units/ml Monitor platelets by anticoagulation protocol: Yes   Plan:  -Increase heparin to 2250 units/hr -Daily HL, CBC    Dierdre Harness, BS, PharmD Clinical Pharmacy Resident [phone] (Pager) 01/24/2016 10:14 AM

## 2016-01-24 NOTE — Progress Notes (Signed)
ANTICOAGULATION CONSULT NOTE  Pharmacy Consult for Heparin  Indication: Portal Vein Thrombosis  Patient Measurements: Height: 5' 11"  (180.3 cm) Weight: 207 lb (93.895 kg) IBW/kg (Calculated) : 75.3  Vital Signs: Temp: 98.6 F (37 C) (07/20 1600) Temp Source: Oral (07/20 1600) BP: 132/53 mmHg (07/20 1754) Pulse Rate: 72 (07/20 1754)  Labs:  Recent Labs  01/22/16 0335 01/23/16 0347  01/23/16 1959 01/24/16 0248 01/24/16 1651  HGB 14.4 13.8  --   --  13.4  --   HCT 43.1 42.0  --   --  40.7  --   PLT 210 193  --   --  167  --   LABPROT 18.0*  --   --   --   --   --   INR 1.48  --   --   --   --   --   HEPARINUNFRC 0.32 0.27*  < > 0.36 0.31 0.29*  CREATININE 0.96 0.89  --   --  0.76  --   < > = values in this interval not displayed.  Estimated Creatinine Clearance: 106.2 mL/min (by C-G formula based on Cr of 0.76).   Assessment: 65 y/o M with portal vein thrombosis on heparin. HL slightly therapeutic at 0.31. Noted plans to continue watching for now, plans for TIPS approach for catheter directed thrombolysis/thrombectomy if patient worsens.  HL slightly SUBtherapeutic, will increase rate.  Goal of Therapy:  Heparin level 0.3-0.7 units/ml Monitor platelets by anticoagulation protocol: Yes   Plan:  -Increase heparin to 2400 units/hr -Daily HL, CBC -Next level with AM labs    Hughes Better, PharmD, BCPS Clinical Pharmacist 01/24/2016 6:40 PM

## 2016-01-24 NOTE — Progress Notes (Addendum)
[facility] PROGRESS NOTE  Jerry [name] YIF:[id number] DOB: Sep 11, 1950 DOA: 01/21/2016   PCP: No primary care provider on file.     Assessment/Plan: Active Problems:   Portal vein thrombosis   Diabetes mellitus (HCC)   CAD (coronary artery disease)   Diabetes mellitus with complication (HCC)   Essential hypertension   Urinary tract infectious disease   65 y.o. male with medical history significant for DM, Hyperlipidemia, Kidney/urether cancer (2001), presenting to the ED with Acute onset of nausea, vomiting and abdominal distention since 1 am.  CT showing SMV thrombus, small portal venous component, and edematous bowel  Assessment and plan    Portal Vein Thrombosis CT A/P: portal vein thrombosis with occlusive ileal vein clot causing congestion/ thickening of multiple ileal loops and non-occlusive thrombus, extending to the SMV and main portal vein .continue stepdown IV heparin initiated per Pharmacy . Dilaudid IV for pain and Phenergan prn IV for nausea   -this is much better clinically today, he has return of bowel function, NG tube discontinued, surgery recommended nothing by mouth, however patient kept requesting clear liquids,   Interventional Radiology and VVS consulted ,no role for surgery for his thrombus, he recommended interventional radiology evaluation for consideration of thrombolytic lysis   potential intervention by radiology today,plan would be TIPS approach for catheter directed thrombolysis/thrombectomy of SMV thrombus, pressure measurement, and possible TIPS completion Interventional radiology recommended surgical consultation for  ileus  Pt  had a small BM, abdomen still distended White count is coming down  CAD, EKG without ACS changes , last cardiac catheterization with stent in 2001 , patient is cardiac pain free at this time. Continue home meds when nausea resolves 2 D echo  shows EF of 86-76%, grade 1 diastolic dysfunction Hydralazine for  BP 160/90    UTI suggested by +nitrites in urine. 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 No history of Hepatitis AST/ALT 61/66, now 30/39, bilirubin  1.0 Lipase 20        Hyperlipidemia Continue home statins  Type II Diabetes Current blood sugar level is 199 Hgb A1C 8.9, hold metformin Hold home oral diabetic medications.  SSI Accu-Cheks improved on Lantus   DVT prophylaxsis  heparin drip  Code Status:  Full code    Family Communication: Discussed in detail with the patient, all imaging results, lab results explained to the patient   Disposition Plan:  Continue telemetry, disposition per IR and CCS      Consultants:  Interventional radiology  Vascular surgery  General surgery    Procedures:  None  Antibiotics: Anti-infectives    Start     Dose/Rate Route Frequency Ordered Stop   01/21/16 0900  cefTRIAXone (ROCEPHIN) 1 g in dextrose 5 % 50 mL IVPB     1 g 100 mL/hr over 30 Minutes Intravenous Every 24 hours 01/21/16 0827           HPI/Subjective: Patient is still quite distended however have a small bowel movement yesterday, begging for clear liquids  Objective: Filed Vitals:   01/24/16 0754 01/24/16 0800 01/24/16 0900 01/24/16 1044  BP:  167/75 156/79 166/70  Pulse:  69 68 71  Temp: 99.1 F (37.3 C)   98.7 F (37.1  C)  TempSrc: Oral   Oral  Resp:  20 16 17   Height:      Weight:      SpO2:  96% 95% 97%    Intake/Output Summary (Last 24 hours) at 01/24/16 1341 Last data filed at 01/24/16 1200  Gross per 24 hour  Intake   2928 ml  Output   2675 ml  Net    253 ml    Exam:  Examination:  General exam: Appears calm and comfortable  Respiratory system: Clear  to auscultation. Respiratory effort normal. Cardiovascular system: S1 & S2 heard, RRR. No JVD, murmurs, rubs, gallops or clicks. No pedal edema. Gastrointestinal system: Distended, soft and nontender. No organomegaly or masses felt. Normal bowel sounds heard. Central nervous system: Alert and oriented. No focal neurological deficits. Extremities: Symmetric 5 x 5 power. Skin: No rashes, lesions or ulcers Psychiatry: Judgement and insight appear normal. Mood & affect appropriate.     Data Reviewed: I have personally reviewed following labs and imaging studies  Micro Results Recent Results (from the past 240 hour(s))  Culture, blood (Routine X 2) w Reflex to ID Panel     Status: None (Preliminary result)   Collection Time: 01/21/16 11:37 AM  Result Value Ref Range Status   Specimen Description BLOOD LEFT HAND  Final   Special Requests IN PEDIATRIC BOTTLE 3CC  Final   Culture NO GROWTH 2 DAYS  Final   Report Status PENDING  Incomplete  MRSA PCR Screening     Status: None   Collection Time: 01/21/16 11:40 AM  Result Value Ref Range Status   MRSA by PCR NEGATIVE NEGATIVE Final    Comment:        The GeneXpert MRSA Assay (FDA approved for NASAL specimens only), is one component of a comprehensive MRSA colonization surveillance program. It is not intended to diagnose MRSA infection nor to guide or monitor treatment for MRSA infections.   Culture, blood (Routine X 2) w Reflex to ID Panel     Status: None (Preliminary result)   Collection Time: 01/21/16 11:42 AM  Result Value Ref Range Status   Specimen Description RIGHT ANTECUBITAL  Final   Special Requests BOTTLES DRAWN AEROBIC ONLY 5CC  Final   Culture NO GROWTH 2 DAYS  Final   Report Status PENDING  Incomplete  Urine culture     Status: Abnormal   Collection Time: 01/21/16  2:00 PM  Result Value Ref Range Status   Specimen Description URINE, RANDOM  Final   Special Requests NONE  Final   Culture (A)  Final    40,000  COLONIES/mL STAPHYLOCOCCUS SPECIES (COAGULASE NEGATIVE)   Report Status 01/23/2016 FINAL  Final   Organism ID, Bacteria STAPHYLOCOCCUS SPECIES (COAGULASE NEGATIVE) (A)  Final      Susceptibility   Staphylococcus species (coagulase negative) - MIC*    CIPROFLOXACIN <=0.5 SENSITIVE Sensitive     GENTAMICIN <=0.5 SENSITIVE Sensitive     NITROFURANTOIN <=16 SENSITIVE Sensitive     OXACILLIN <=0.25 SENSITIVE Sensitive     TETRACYCLINE <=1 SENSITIVE Sensitive     VANCOMYCIN 1 SENSITIVE Sensitive     TRIMETH/SULFA <=10 SENSITIVE Sensitive     CLINDAMYCIN <=0.25 SENSITIVE Sensitive     RIFAMPIN <=0.5 SENSITIVE Sensitive     Inducible Clindamycin NEGATIVE Sensitive     * 40,000 COLONIES/mL STAPHYLOCOCCUS SPECIES (COAGULASE NEGATIVE)    Radiology Reports Ct Abdomen Pelvis W Contrast  01/21/2016  CLINICAL DATA:  Right-sided abdominal pain EXAM: CT ABDOMEN AND PELVIS  WITH CONTRAST TECHNIQUE: Multidetector CT imaging of the abdomen and pelvis was performed using the standard protocol following bolus administration of intravenous contrast. CONTRAST:  158m ISOVUE-300 IOPAMIDOL (ISOVUE-300) INJECTION 61% COMPARISON:  07/27/2010 FINDINGS: Lower chest and abdominal wall: Extensive atherosclerotic calcification in the coronary circulation. Previous ventral hernia repair with mesh. Hepatobiliary: Cirrhotic liver. No focal mass lesion is seen. Portal hypertension with small ascites, mild splenomegaly, and venous collaterals.Occlusive portal thrombus seen in ileal branches with nonocclusive thrombus extending into the SMV and main portal vein. This has the appearance of bland thrombus. Pancreas: Unremarkable. Spleen: Unremarkable. Adrenals/Urinary Tract: Negative adrenals. Partial nephrectomy to the upper pole left kidney where a presumed renal cell carcinoma was seen previously. Bilateral renal cysts. No hydronephrosis or stone. Unremarkable bladder. Stomach/Bowel: Submucosal edema causes thickening of multiple  ileal loops with mesenteric fat congestion. No bowel obstruction. Reproductive:Symmetric prostate enlargement. Vascular/Lymphatic: Portal vein thrombus as noted above. Aortic and iliac atherosclerosis. Mild enlargement of deep liver drainage lymph nodes, presumably reactive to the chronic liver disease. Other: Negative Musculoskeletal: No acute abnormalities. Diffuse disc and facet degeneration. IMPRESSION: 1. 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 Nonocclusive thrombus extends into the SMV and main portal veins. 2. Cirrhosis. 3. Small ascites. 4. Unremarkable appearance of left partial nephrectomy site. Electronically Signed   By: JMonte FantasiaM.D.   On: 01/21/2016 05:23   Dg Abd Portable 1v  01/23/2016  CLINICAL DATA:  NG tube placement EXAM: PORTABLE ABDOMEN - 1 VIEW COMPARISON:  CT scan from 2 days ago. FINDINGS: NG tube tip is transpyloric, in the distal descending duodenum. NG tube could be pulled back 9-10 cm to place the tip in the distal stomach. There is mild gaseous distention of small bowel and colon. IMPRESSION: NG tube tip is transpyloric. Electronically Signed   By: EMisty StanleyM.D.   On: 01/23/2016 17:21     CBC  Recent Labs Lab 01/21/16 0315 01/22/16 0335 01/23/16 0347 01/24/16 0248  WBC 9.7 22.9* 17.3* 11.2*  HGB 14.4 14.4 13.8 13.4  HCT 42.7 43.1 42.0 40.7  PLT 173 210 193 167  MCV 91.8 92.7 93.3 92.5  MCH 31.0 31.0 30.7 30.5  MCHC 33.7 33.4 32.9 32.9  RDW 13.4 13.8 14.1 13.9  LYMPHSABS  --   --  2.4  --   MONOABS  --   --  1.1*  --   EOSABS  --   --  0.1  --   BASOSABS  --   --  0.0  --     Chemistries   Recent Labs Lab 01/21/16 0315 01/22/16 0335 01/23/16 0347 01/24/16 0248  NA 139 141 139 140  K 3.8 4.0 3.7 3.8  CL 107 109 107 113*  CO2 26 26 25 24   GLUCOSE 199* 290* 159* 133*  BUN 15 15 15 13   CREATININE 0.86 0.96 0.89 0.76  CALCIUM 8.9 8.0* 8.1* 8.1*  AST 61* 32 30 28  ALT  66* 44 39 33  ALKPHOS 126 85 86 75  BILITOT 0.8 0.8 1.0 0.8   ------------------------------------------------------------------------------------------------------------------ estimated creatinine clearance is 106.2 mL/min (by C-G formula based on Cr of 0.76). ------------------------------------------------------------------------------------------------------------------ No results for input(s): HGBA1C in the last 72 hours. ------------------------------------------------------------------------------------------------------------------ No results for input(s): CHOL, HDL, LDLCALC, TRIG, CHOLHDL, LDLDIRECT in the last 72 hours. ------------------------------------------------------------------------------------------------------------------ No results for input(s): TSH, T4TOTAL, T3FREE, THYROIDAB in the last 72 hours.  Invalid input(s): FREET3 ------------------------------------------------------------------------------------------------------------------ No results for input(s): VITAMINB12, FOLATE, FERRITIN, TIBC,  IRON, RETICCTPCT in the last 72 hours.  Coagulation profile  Recent Labs Lab 01/22/16 0335  INR 1.48    No results for input(s): DDIMER in the last 72 hours.  Cardiac Enzymes No results for input(s): CKMB, TROPONINI, MYOGLOBIN in the last 168 hours.  Invalid input(s): CK ------------------------------------------------------------------------------------------------------------------ Invalid input(s): POCBNP   CBG:  Recent Labs Lab 01/23/16 1202 01/23/16 1606 01/23/16 2130 01/24/16 0752 01/24/16 1141  GLUCAP 132* 124* 104* 126* 130*       Studies: Dg Abd Portable 1v  01/23/2016  CLINICAL DATA:  NG tube placement EXAM: PORTABLE ABDOMEN - 1 VIEW COMPARISON:  CT scan from 2 days ago. FINDINGS: NG tube tip is transpyloric, in the distal descending duodenum. NG tube could be pulled back 9-10 cm to place the tip in the distal stomach. There is mild gaseous  distention of small bowel and colon. IMPRESSION: NG tube tip is transpyloric. Electronically Signed   By: Misty Stanley M.D.   On: 01/23/2016 17:21      Lab Results  Component Value Date   HGBA1C 8.9* 01/21/2016   Lab Results  Component Value Date   CREATININE 0.76 01/24/2016       Scheduled Meds: . antiseptic oral rinse  7 mL Mouth Rinse q12n4p  . cefTRIAXone (ROCEPHIN)  IV  1 g Intravenous Q24H  . chlorhexidine  15 mL Mouth Rinse BID  . insulin aspart  0-9 Units Subcutaneous TID WC  . insulin glargine  10 Units Subcutaneous Daily  . isosorbide dinitrate  30 mg Oral TID  . lisinopril  5 mg Oral Daily  . sodium chloride flush  3 mL Intravenous Q12H   Continuous Infusions: . 0.9 % NaCl with KCl 20 mEq / L 100 mL/hr at 01/24/16 1200  . heparin 2,250 Units/hr (01/24/16 1200)     LOS: 3 days    Time spent: >30 MINS    [facility] Pager [phone]. If 7PM-7AM, please contact night-coverage at [email or web address], password [facility] 01/24/2016, 1:41 PM  LOS: 3 days

## 2016-01-25 LAB — COMPREHENSIVE METABOLIC PANEL
ALT: 30 U/L (ref 17–63)
AST: 28 U/L (ref 15–41)
Albumin: 2.3 g/dL — ABNORMAL LOW (ref 3.5–5.0)
Alkaline Phosphatase: 79 U/L (ref 38–126)
Anion gap: 3 — ABNORMAL LOW (ref 5–15)
BUN: 15 mg/dL (ref 6–20)
CHLORIDE: 112 mmol/L — AB (ref 101–111)
CO2: 25 mmol/L (ref 22–32)
CREATININE: 0.74 mg/dL (ref 0.61–1.24)
Calcium: 8.2 mg/dL — ABNORMAL LOW (ref 8.9–10.3)
Glucose, Bld: 131 mg/dL — ABNORMAL HIGH (ref 65–99)
Potassium: 3.7 mmol/L (ref 3.5–5.1)
Sodium: 140 mmol/L (ref 135–145)
TOTAL PROTEIN: 5.7 g/dL — AB (ref 6.5–8.1)
Total Bilirubin: 0.7 mg/dL (ref 0.3–1.2)

## 2016-01-25 LAB — PROTIME-INR
INR: 1.39 (ref 0.00–1.49)
PROTHROMBIN TIME: 17.2 s — AB (ref 11.6–15.2)

## 2016-01-25 LAB — GLUCOSE, CAPILLARY
GLUCOSE-CAPILLARY: 129 mg/dL — AB (ref 65–99)
GLUCOSE-CAPILLARY: 167 mg/dL — AB (ref 65–99)
Glucose-Capillary: 152 mg/dL — ABNORMAL HIGH (ref 65–99)
Glucose-Capillary: 191 mg/dL — ABNORMAL HIGH (ref 65–99)

## 2016-01-25 LAB — CBC
HCT: 36.5 % — ABNORMAL LOW (ref 39.0–52.0)
Hemoglobin: 12.1 g/dL — ABNORMAL LOW (ref 13.0–17.0)
MCH: 30.3 pg (ref 26.0–34.0)
MCHC: 33.2 g/dL (ref 30.0–36.0)
MCV: 91.3 fL (ref 78.0–100.0)
PLATELETS: 151 10*3/uL (ref 150–400)
RBC: 4 MIL/uL — AB (ref 4.22–5.81)
RDW: 14 % (ref 11.5–15.5)
WBC: 7.8 10*3/uL (ref 4.0–10.5)

## 2016-01-25 LAB — HEPARIN LEVEL (UNFRACTIONATED): HEPARIN UNFRACTIONATED: 0.68 [IU]/mL (ref 0.30–0.70)

## 2016-01-25 MED ORDER — POLYETHYLENE GLYCOL 3350 17 G PO PACK: 17.0000 g | PACK | Freq: Every day | ORAL | Status: DC

## 2016-01-25 MED ORDER — WARFARIN SODIUM 7.5 MG PO TABS
7.5000 mg | ORAL_TABLET | Freq: Once | ORAL | Status: AC
  Administered 2016-01-25: 7.5 mg via ORAL
  Filled 2016-01-25: qty 1

## 2016-01-25 MED ORDER — WARFARIN VIDEO
Freq: Once | Status: AC
  Administered 2016-01-25: 16:00:00

## 2016-01-25 MED ORDER — POLYETHYLENE GLYCOL 3350 17 G PO PACK
17.0000 g | PACK | Freq: Two times a day (BID) | ORAL | Status: DC
  Administered 2016-01-26 – 2016-01-29 (×4): 17 g via ORAL
  Filled 2016-01-25 (×9): qty 1

## 2016-01-25 MED ORDER — BISACODYL 10 MG RE SUPP
10.0000 mg | Freq: Once | RECTAL | Status: AC
Start: 2016-01-25 — End: 2016-01-25
  Administered 2016-01-25: 10 mg via RECTAL
  Filled 2016-01-25 (×2): qty 1

## 2016-01-25 MED ORDER — WARFARIN - PHARMACIST DOSING INPATIENT
Freq: Every day | Status: DC
  Administered 2016-01-25 – 2016-01-30 (×3)

## 2016-01-25 MED ORDER — COUMADIN BOOK
Freq: Once | Status: AC
  Administered 2016-01-25: 14:00:00
  Filled 2016-01-25: qty 1

## 2016-01-25 NOTE — Progress Notes (Signed)
In to see patient on leadership rounds. Patient with questions regarding which insurance was listed on his record. Patient reported that he was rushed into the ED in an emergency and only gave his Medicare card. Patient was concerned that the [place] was not notified of this admission and wanted to make sure that the bills would be sent to the [place]. Called And spoke with Debbie in insurance verification, and Jerry [name] spoke with her via phone to answer his questions related to billing. Myalee Stengel, Bryn Gulling

## 2016-01-25 NOTE — Care Management Important Message (Signed)
Important Message  Patient Details  Name: Jerry [name] MRN: [id number] Date of Birth: 04-21-1951   Medicare Important Message Given:  Yes    Loann Quill 01/25/2016, 8:29 AM

## 2016-01-25 NOTE — Progress Notes (Signed)
ANTICOAGULATION CONSULT NOTE  Pharmacy Consult for Heparin and Initiating Warfarin Indication: Portal Vein Thrombosis  Patient Measurements: Height: 5' 11"  (180.3 cm) Weight: 219 lb (99.338 kg) IBW/kg (Calculated) : 75.3  Vital Signs: Temp: 97.9 F (36.6 C) (07/21 0734) Temp Source: Oral (07/21 0734) BP: 146/72 mmHg (07/21 0734) Pulse Rate: 58 (07/21 0734)  Labs:  Recent Labs  01/23/16 0347  01/24/16 0248 01/24/16 1651 01/25/16 0520  HGB 13.8  --  13.4  --  12.1*  HCT 42.0  --  40.7  --  36.5*  PLT 193  --  167  --  151  HEPARINUNFRC 0.27*  < > 0.31 0.29* 0.68  CREATININE 0.89  --  0.76  --  0.74  < > = values in this interval not displayed.  Estimated Creatinine Clearance: 109.1 mL/min (by C-G formula based on Cr of 0.74).  Assessment: 65 y/o M with portal vein thrombosis on heparin. HL therapeutic this AM. Patient is to be started on warfarin. Plans for TIPS approach for catheter directed thrombolysis/thrombectomy if patient worsens.  Goal of Therapy:  Heparin level 0.3-0.7 units/ml Monitor platelets by anticoagulation protocol: Yes  INR 2-3   Plan:  -Continue heparin to 2400 units/hr -Start warfarin 7.63m x1 tonight -Daily HL, CBC, INR  KMyer Peer([name] Ormond, PharmD  PGY1 Pharmacy Resident Pager: [phone]/21/2017 9:59 AM    I discussed / reviewed the pharmacy note by Dr. KTracey Harriesand I agree with the resident's findings and plans as documented. Thank you LAnette Guarneri PharmD [phone]

## 2016-01-25 NOTE — Progress Notes (Addendum)
[facility] PROGRESS NOTE  NANCY MANUELE ZCH:[id number] DOB: 1950-08-30 DOA: 01/21/2016   PCP: No primary care provider on file.     Assessment/Plan: Active Problems:   Portal vein thrombosis   Diabetes mellitus (HCC)   CAD (coronary artery disease)   Diabetes mellitus with complication (HCC)   Essential hypertension   Urinary tract infectious disease   65 y.o. male with medical history significant for DM, Hyperlipidemia, Kidney/urether cancer (2001), presenting to the ED with Acute onset of nausea, vomiting and abdominal distention since 1 am.  CT showing SMV thrombus, small portal venous component, and edematous bowel  Assessment and plan    Portal Vein Thrombosis-patient continues to have nausea and abdominal pain progressing very slowly, unable to tolerate clear liquids  CT A/P: portal vein thrombosis with occlusive ileal vein clot causing congestion/ thickening of multiple ileal loops and non-occlusive thrombus, extending to the SMV and main portal vein .continue stepdown Continue IV heparin, start Coumadin as no anticipated surgery or procedures planned. Dilaudid IV for pain and Phenergan prn IV for nausea  NG tube discontinued, surgery recommended nothing by mouth, however patient kept requesting clear liquids,  but unable to tolerate it, patient also needs to have a bowel movement Will order an aggressive constipation regimen Interventional Radiology and VVS consulted ,no role for surgery for his thrombus, he recommended interventional radiology evaluation for consideration of thrombolytic lysis Interventional radiology does not feel that the patient would need TIPS/thrombolysis or thrombectomy  Ileus Continue MiraLAX twice a day, will also order a Dulcolax suppository   CAD, EKG without ACS changes , last cardiac catheterization with stent in 2001 , patient is cardiac pain free at this time. Continue home meds when nausea resolves 2 D echo  shows EF of  28-78%, grade 1 diastolic dysfunction Hydralazine for BP 160/90    UTI suggested by +nitrites in urine. 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 No history of Hepatitis AST/ALT 61/66, now 30/39, bilirubin  1.0 Lipase 20        Hyperlipidemia Continue home statins  Type II Diabetes Current blood sugar level is 199 Hgb A1C 8.9, hold metformin Hold home oral diabetic medications.  SSI Accu-Cheks improved on Lantus   DVT prophylaxsis  heparin drip  Code Status:  Full code    Family Communication: Discussed in detail with the patient, all imaging results, lab results explained to the patient   Disposition Plan:  Continue telemetry, disposition per IR and CCS      Consultants:  Interventional radiology  Vascular surgery  General surgery    Procedures:  None  Antibiotics: Anti-infectives    Start     Dose/Rate Route Frequency Ordered Stop   01/21/16 0900  cefTRIAXone (ROCEPHIN) 1 g in dextrose 5 % 50 mL IVPB     1 g 100 mL/hr over 30 Minutes Intravenous Every 24 hours 01/21/16 0827           HPI/Subjective: Unable to tolerate clear liquids has not had a bowel movement  Objective: Filed Vitals:   01/24/16 1754 01/24/16 1943 01/25/16 0542 01/25/16 0734  BP: 132/53 127/64 150/73 146/72  Pulse: 72 71 66 58  Temp:  98.6 F (37 C) 98.4 F (  36.9 C) 97.9 F (36.6 C)  TempSrc:  Oral Oral Oral  Resp: 18 16 16 18   Height:  5' 11"  (1.803 m)    Weight:  99.338 kg (219 lb)    SpO2:  96% 97% 97%    Intake/Output Summary (Last 24 hours) at 01/25/16 1015 Last data filed at 01/25/16 0735  Gross per 24 hour  Intake 2705.07 ml  Output      0 ml  Net 2705.07 ml     Exam:  Examination:  General exam: Appears calm and comfortable  Respiratory system: Clear to auscultation. Respiratory effort normal. Cardiovascular system: S1 & S2 heard, RRR. No JVD, murmurs, rubs, gallops or clicks. No pedal edema. Gastrointestinal system: Distended, soft and nontender. No organomegaly or masses felt. Normal bowel sounds heard. Central nervous system: Alert and oriented. No focal neurological deficits. Extremities: Symmetric 5 x 5 power. Skin: No rashes, lesions or ulcers Psychiatry: Judgement and insight appear normal. Mood & affect appropriate.     Data Reviewed: I have personally reviewed following labs and imaging studies  Micro Results Recent Results (from the past 240 hour(s))  Culture, blood (Routine X 2) w Reflex to ID Panel     Status: None (Preliminary result)   Collection Time: 01/21/16 11:37 AM  Result Value Ref Range Status   Specimen Description BLOOD LEFT HAND  Final   Special Requests IN PEDIATRIC BOTTLE 3CC  Final   Culture NO GROWTH 3 DAYS  Final   Report Status PENDING  Incomplete  MRSA PCR Screening     Status: None   Collection Time: 01/21/16 11:40 AM  Result Value Ref Range Status   MRSA by PCR NEGATIVE NEGATIVE Final    Comment:        The GeneXpert MRSA Assay (FDA approved for NASAL specimens only), is one component of a comprehensive MRSA colonization surveillance program. It is not intended to diagnose MRSA infection nor to guide or monitor treatment for MRSA infections.   Culture, blood (Routine X 2) w Reflex to ID Panel     Status: None (Preliminary result)   Collection Time: 01/21/16 11:42 AM  Result Value Ref Range Status   Specimen Description RIGHT ANTECUBITAL  Final   Special Requests BOTTLES DRAWN AEROBIC ONLY 5CC  Final   Culture NO GROWTH 3 DAYS  Final   Report Status PENDING  Incomplete  Urine culture     Status: Abnormal   Collection Time: 01/21/16  2:00 PM  Result Value Ref Range Status   Specimen  Description URINE, RANDOM  Final   Special Requests NONE  Final   Culture (A)  Final    40,000 COLONIES/mL STAPHYLOCOCCUS SPECIES (COAGULASE NEGATIVE)   Report Status 01/23/2016 FINAL  Final   Organism ID, Bacteria STAPHYLOCOCCUS SPECIES (COAGULASE NEGATIVE) (A)  Final      Susceptibility   Staphylococcus species (coagulase negative) - MIC*    CIPROFLOXACIN <=0.5 SENSITIVE Sensitive     GENTAMICIN <=0.5 SENSITIVE Sensitive     NITROFURANTOIN <=16 SENSITIVE Sensitive     OXACILLIN <=0.25 SENSITIVE Sensitive     TETRACYCLINE <=1 SENSITIVE Sensitive     VANCOMYCIN 1 SENSITIVE Sensitive     TRIMETH/SULFA <=10 SENSITIVE Sensitive     CLINDAMYCIN <=0.25 SENSITIVE Sensitive     RIFAMPIN <=0.5 SENSITIVE Sensitive     Inducible Clindamycin NEGATIVE Sensitive     * 40,000 COLONIES/mL STAPHYLOCOCCUS SPECIES (COAGULASE NEGATIVE)    Radiology Reports Ct Abdomen Pelvis W Contrast  01/21/2016  CLINICAL DATA:  Right-sided abdominal pain EXAM: CT ABDOMEN AND PELVIS WITH CONTRAST TECHNIQUE: Multidetector CT imaging of the abdomen and pelvis was performed using the standard protocol following bolus administration of intravenous contrast. CONTRAST:  130m ISOVUE-300 IOPAMIDOL (ISOVUE-300) INJECTION 61% COMPARISON:  07/27/2010 FINDINGS: Lower chest and abdominal wall: Extensive atherosclerotic calcification in the coronary circulation. Previous ventral hernia repair with mesh. Hepatobiliary: Cirrhotic liver. No focal mass lesion is seen. Portal hypertension with small ascites, mild splenomegaly, and venous collaterals.Occlusive portal thrombus seen in ileal branches with nonocclusive thrombus extending into the SMV and main portal vein. This has the appearance of bland thrombus. Pancreas: Unremarkable. Spleen: Unremarkable. Adrenals/Urinary Tract: Negative adrenals. Partial nephrectomy to the upper pole left kidney where a presumed renal cell carcinoma was seen previously. Bilateral renal cysts. No hydronephrosis  or stone. Unremarkable bladder. Stomach/Bowel: Submucosal edema causes thickening of multiple ileal loops with mesenteric fat congestion. No bowel obstruction. Reproductive:Symmetric prostate enlargement. Vascular/Lymphatic: Portal vein thrombus as noted above. Aortic and iliac atherosclerosis. Mild enlargement of deep liver drainage lymph nodes, presumably reactive to the chronic liver disease. Other: Negative Musculoskeletal: No acute abnormalities. Diffuse disc and facet degeneration. IMPRESSION: 1. 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 Nonocclusive thrombus extends into the SMV and main portal veins. 2. Cirrhosis. 3. Small ascites. 4. Unremarkable appearance of left partial nephrectomy site. Electronically Signed   By: JMonte FantasiaM.D.   On: 01/21/2016 05:23   Dg Abd Portable 1v  01/23/2016  CLINICAL DATA:  NG tube placement EXAM: PORTABLE ABDOMEN - 1 VIEW COMPARISON:  CT scan from 2 days ago. FINDINGS: NG tube tip is transpyloric, in the distal descending duodenum. NG tube could be pulled back 9-10 cm to place the tip in the distal stomach. There is mild gaseous distention of small bowel and colon. IMPRESSION: NG tube tip is transpyloric. Electronically Signed   By: EMisty StanleyM.D.   On: 01/23/2016 17:21     CBC  Recent Labs Lab 01/21/16 0315 01/22/16 0335 01/23/16 0347 01/24/16 0248 01/25/16 0520  WBC 9.7 22.9* 17.3* 11.2* 7.8  HGB 14.4 14.4 13.8 13.4 12.1*  HCT 42.7 43.1 42.0 40.7 36.5*  PLT 173 210 193 167 151  MCV 91.8 92.7 93.3 92.5 91.3  MCH 31.0 31.0 30.7 30.5 30.3  MCHC 33.7 33.4 32.9 32.9 33.2  RDW 13.4 13.8 14.1 13.9 14.0  LYMPHSABS  --   --  2.4  --   --   MONOABS  --   --  1.1*  --   --   EOSABS  --   --  0.1  --   --   BASOSABS  --   --  0.0  --   --     Chemistries   Recent Labs Lab 01/21/16 0315 01/22/16 0335 01/23/16 0347 01/24/16 0248 01/25/16 0520  NA 139 141 139 140 140  K  3.8 4.0 3.7 3.8 3.7  CL 107 109 107 113* 112*  CO2 26 26 25 24 25   GLUCOSE 199* 290* 159* 133* 131*  BUN 15 15 15 13 15   CREATININE 0.86 0.96 0.89 0.76 0.74  CALCIUM 8.9 8.0* 8.1* 8.1* 8.2*  AST 61* 32 30 28 28   ALT 66* 44 39 33 30  ALKPHOS 126 85 86 75 79  BILITOT 0.8 0.8 1.0 0.8 0.7   ------------------------------------------------------------------------------------------------------------------ estimated creatinine clearance is 109.1 mL/min (by C-G formula based on Cr of 0.74). ------------------------------------------------------------------------------------------------------------------ No results for input(s): HGBA1C in the last 72  hours. ------------------------------------------------------------------------------------------------------------------ No results for input(s): CHOL, HDL, LDLCALC, TRIG, CHOLHDL, LDLDIRECT in the last 72 hours. ------------------------------------------------------------------------------------------------------------------ No results for input(s): TSH, T4TOTAL, T3FREE, THYROIDAB in the last 72 hours.  Invalid input(s): FREET3 ------------------------------------------------------------------------------------------------------------------ No results for input(s): VITAMINB12, FOLATE, FERRITIN, TIBC, IRON, RETICCTPCT in the last 72 hours.  Coagulation profile  Recent Labs Lab 01/22/16 0335 01/25/16 0854  INR 1.48 1.39    No results for input(s): DDIMER in the last 72 hours.  Cardiac Enzymes No results for input(s): CKMB, TROPONINI, MYOGLOBIN in the last 168 hours.  Invalid input(s): CK ------------------------------------------------------------------------------------------------------------------ Invalid input(s): POCBNP   CBG:  Recent Labs Lab 01/24/16 0752 01/24/16 1141 01/24/16 1622 01/24/16 1937 01/25/16 0733  GLUCAP 126* 130* 239* 166* 129*       Studies: Dg Abd Portable 1v  01/23/2016  CLINICAL DATA:  NG tube  placement EXAM: PORTABLE ABDOMEN - 1 VIEW COMPARISON:  CT scan from 2 days ago. FINDINGS: NG tube tip is transpyloric, in the distal descending duodenum. NG tube could be pulled back 9-10 cm to place the tip in the distal stomach. There is mild gaseous distention of small bowel and colon. IMPRESSION: NG tube tip is transpyloric. Electronically Signed   By: Misty Stanley M.D.   On: 01/23/2016 17:21      Lab Results  Component Value Date   HGBA1C 8.9* 01/21/2016   Lab Results  Component Value Date   CREATININE 0.74 01/25/2016       Scheduled Meds: . antiseptic oral rinse  7 mL Mouth Rinse q12n4p  . cefTRIAXone (ROCEPHIN)  IV  1 g Intravenous Q24H  . chlorhexidine  15 mL Mouth Rinse BID  . coumadin book   Does not apply Once  . insulin aspart  0-9 Units Subcutaneous TID WC  . insulin glargine  10 Units Subcutaneous Daily  . isosorbide dinitrate  30 mg Oral TID  . lisinopril  5 mg Oral Daily  . polyethylene glycol  17 g Oral Daily  . sodium chloride flush  3 mL Intravenous Q12H  . warfarin  7.5 mg Oral ONCE-1800  . warfarin   Does not apply Once  . Warfarin - Pharmacist Dosing Inpatient   Does not apply q1800   Continuous Infusions: . 0.9 % NaCl with KCl 20 mEq / L 100 mL/hr at 01/25/16 0452  . heparin 2,400 Units/hr (01/25/16 0837)     LOS: 4 days    Time spent: >30 MINS    [facility] Pager [phone]. If 7PM-7AM, please contact night-coverage at [email or web address], password [facility] 01/25/2016, 10:15 AM  LOS: 4 days

## 2016-01-25 NOTE — Progress Notes (Signed)
RN went in to administer dulcolax suppository per MD. Pt also informed of the order for a soap suds enema. Pt states that he had a BM and did not want suppository and enema. Primary RN made aware.     Carole Civil, RN, [facility] [address] Phone [phone]

## 2016-01-25 NOTE — Progress Notes (Signed)
  Subjective: Feels better but still bloated  Objective: Vital signs in last 24 hours: Temp:  [97.9 F (36.6 C)-98.7 F (37.1 C)] 97.9 F (36.6 C) (07/21 0734) Pulse Rate:  [58-76] 58 (07/21 0734) Resp:  [16-18] 18 (07/21 0734) BP: (127-171)/(53-73) 146/72 mmHg (07/21 0734) SpO2:  [96 %-98 %] 97 % (07/21 0734) Weight:  [99.338 kg (219 lb)] 99.338 kg (219 lb) (07/20 1943) Last BM Date: 01/24/16  Intake/Output from previous day: 07/20 0701 - 07/21 0700 In: 3116.6 [P.O.:240; I.V.:2826.6; IV Piggyback:50] Out: 400 [Urine:400] Intake/Output this shift:    GI: moderately distended but still much improved, nontender, some bs  Lab Results:   Recent Labs  01/24/16 0248 01/25/16 0520  WBC 11.2* 7.8  HGB 13.4 12.1*  HCT 40.7 36.5*  PLT 167 151   BMET  Recent Labs  01/24/16 0248 01/25/16 0520  NA 140 140  K 3.8 3.7  CL 113* 112*  CO2 24 25  GLUCOSE 133* 131*  BUN 13 15  CREATININE 0.76 0.74  CALCIUM 8.1* 8.2*   PT/INR No results for input(s): LABPROT, INR in the last 72 hours. ABG No results for input(s): PHART, HCO3 in the last 72 hours.  Invalid input(s): PCO2, PO2  Studies/Results: Dg Abd Portable 1v  01/23/2016  CLINICAL DATA:  NG tube placement EXAM: PORTABLE ABDOMEN - 1 VIEW COMPARISON:  CT scan from 2 days ago. FINDINGS: NG tube tip is transpyloric, in the distal descending duodenum. NG tube could be pulled back 9-10 cm to place the tip in the distal stomach. There is mild gaseous distention of small bowel and colon. IMPRESSION: NG tube tip is transpyloric. Electronically Signed   By: Misty Stanley M.D.   On: 01/23/2016 17:21    Anti-infectives: Anti-infectives    Start     Dose/Rate Route Frequency Ordered Stop   01/21/16 0900  cefTRIAXone (ROCEPHIN) 1 g in dextrose 5 % 50 mL IVPB     1 g 100 mL/hr over 30 Minutes Intravenous Every 24 hours 01/21/16 0827        Assessment/Plan: Portal vein thombosis - SMV thrombus, edematous bowel on CT-this  continuing to get better but still does not have return of normal bowel function, will try laxative today, his tenderness is nearly resolved so I dont think he will require any surgery, continue heparin  [facility] 01/25/2016

## 2016-01-26 LAB — HEPARIN LEVEL (UNFRACTIONATED): Heparin Unfractionated: 0.63 IU/mL (ref 0.30–0.70)

## 2016-01-26 LAB — BASIC METABOLIC PANEL
ANION GAP: 3 — AB (ref 5–15)
BUN: 10 mg/dL (ref 6–20)
CHLORIDE: 111 mmol/L (ref 101–111)
CO2: 26 mmol/L (ref 22–32)
Calcium: 8.3 mg/dL — ABNORMAL LOW (ref 8.9–10.3)
Creatinine, Ser: 0.7 mg/dL (ref 0.61–1.24)
GFR calc non Af Amer: >60 mL/min (ref >60)
Glucose, Bld: 122 mg/dL — ABNORMAL HIGH (ref 65–99)
POTASSIUM: 3.8 mmol/L (ref 3.5–5.1)
SODIUM: 140 mmol/L (ref 135–145)

## 2016-01-26 LAB — TRIGLYCERIDES: Triglycerides: 94 mg/dL (ref <150)

## 2016-01-26 LAB — CULTURE, BLOOD (ROUTINE X 2)
CULTURE: NO GROWTH
CULTURE: NO GROWTH

## 2016-01-26 LAB — GLUCOSE, CAPILLARY
GLUCOSE-CAPILLARY: 140 mg/dL — AB (ref 65–99)
Glucose-Capillary: 97 mg/dL (ref 65–99)
Glucose-Capillary: 144 mg/dL — ABNORMAL HIGH (ref 65–99)
Glucose-Capillary: 171 mg/dL — ABNORMAL HIGH (ref 65–99)

## 2016-01-26 LAB — PHOSPHORUS: PHOSPHORUS: 2.7 mg/dL (ref 2.5–4.6)

## 2016-01-26 LAB — CBC
HCT: 36.2 % — ABNORMAL LOW (ref 39.0–52.0)
HEMOGLOBIN: 11.7 g/dL — AB (ref 13.0–17.0)
MCH: 29.8 pg (ref 26.0–34.0)
MCHC: 32.3 g/dL (ref 30.0–36.0)
MCV: 92.3 fL (ref 78.0–100.0)
PLATELETS: 142 10*3/uL — AB (ref 150–400)
RBC: 3.92 MIL/uL — AB (ref 4.22–5.81)
RDW: 13.9 % (ref 11.5–15.5)
WBC: 5.7 10*3/uL (ref 4.0–10.5)

## 2016-01-26 LAB — MAGNESIUM: Magnesium: 1.6 mg/dL — ABNORMAL LOW (ref 1.7–2.4)

## 2016-01-26 LAB — PROTIME-INR
INR: 1.49 (ref 0.00–1.49)
Prothrombin Time: 18.1 seconds — ABNORMAL HIGH (ref 11.6–15.2)

## 2016-01-26 MED ORDER — POTASSIUM CHLORIDE IN NACL 20-0.9 MEQ/L-% IV SOLN
INTRAVENOUS | Status: DC
Start: 2016-01-26 — End: 2016-01-27
  Administered 2016-01-26: 18:00:00 via INTRAVENOUS
  Filled 2016-01-26 (×2): qty 1000

## 2016-01-26 MED ORDER — POTASSIUM PHOSPHATES 15 MMOLE/5ML IV SOLN
15.0000 mmol | Freq: Once | INTRAVENOUS | Status: AC
  Administered 2016-01-26: 15 mmol via INTRAVENOUS
  Filled 2016-01-26: qty 5

## 2016-01-26 MED ORDER — WARFARIN SODIUM 7.5 MG PO TABS
7.5000 mg | ORAL_TABLET | Freq: Once | ORAL | Status: AC
  Administered 2016-01-26: 7.5 mg via ORAL
  Filled 2016-01-26: qty 1

## 2016-01-26 MED ORDER — MAGNESIUM SULFATE 2 GM/50ML IV SOLN
2.0000 g | Freq: Once | INTRAVENOUS | Status: AC
Start: 2016-01-26 — End: 2016-01-26
  Administered 2016-01-26: 2 g via INTRAVENOUS
  Filled 2016-01-26: qty 50

## 2016-01-26 MED ORDER — POTASSIUM CHLORIDE 10 MEQ/50ML IV SOLN
10.0000 meq | INTRAVENOUS | Status: DC
  Filled 2016-01-26 (×2): qty 50

## 2016-01-26 MED ORDER — FAT EMULSION 20 % IV EMUL
240.0000 mL | INTRAVENOUS | Status: AC
  Administered 2016-01-26: 240 mL via INTRAVENOUS
  Filled 2016-01-26: qty 250

## 2016-01-26 MED ORDER — TRACE MINERALS CR-CU-MN-SE-ZN 10-1000-500-60 MCG/ML IV SOLN
INTRAVENOUS | Status: AC
  Administered 2016-01-26: 19:00:00 via INTRAVENOUS
  Filled 2016-01-26: qty 960

## 2016-01-26 MED ORDER — INSULIN ASPART 100 UNIT/ML ~~LOC~~ SOLN
0.0000 [IU] | SUBCUTANEOUS | Status: DC
  Administered 2016-01-27: 5 [IU] via SUBCUTANEOUS
  Administered 2016-01-27 (×2): 2 [IU] via SUBCUTANEOUS
  Administered 2016-01-27: 1 [IU] via SUBCUTANEOUS
  Administered 2016-01-27: 2 [IU] via SUBCUTANEOUS
  Administered 2016-01-27: 1 [IU] via SUBCUTANEOUS
  Administered 2016-01-28: 5 [IU] via SUBCUTANEOUS
  Administered 2016-01-28: 2 [IU] via SUBCUTANEOUS
  Administered 2016-01-28: 3 [IU] via SUBCUTANEOUS
  Administered 2016-01-28 (×2): 2 [IU] via SUBCUTANEOUS

## 2016-01-26 MED ORDER — SODIUM CHLORIDE 0.9% FLUSH: 10.0000 mL | Freq: Two times a day (BID) | INTRAVENOUS | Status: DC

## 2016-01-26 MED ORDER — SODIUM CHLORIDE 0.9% FLUSH
10.0000 mL | INTRAVENOUS | Status: DC | PRN
  Administered 2016-01-28: 10 mL
  Filled 2016-01-26: qty 40

## 2016-01-26 NOTE — Progress Notes (Signed)
[facility] PROGRESS NOTE  Jerry [name] XUX:[id number] DOB: [birth year] DOA: 01/21/2016   PCP: No primary care provider on file.     Assessment/Plan: Active Problems:   Portal vein thrombosis   Diabetes mellitus (HCC)   CAD (coronary artery disease)   Diabetes mellitus with complication (HCC)   Essential hypertension   Urinary tract infectious disease   65 y.o. male with medical history significant for DM, Hyperlipidemia, Kidney/urether cancer (2001), presenting to the ED with Acute onset of nausea, vomiting and abdominal distention since 1 am.  CT showing SMV thrombus, small portal venous component, and edematous bowel  Assessment and plan   Portal Vein Thrombosis-patient continues to have nausea and abdominal pain progressing very slowly, unable to tolerate clear liquids  CT A/P: portal vein thrombosis with occlusive ileal vein clot causing congestion/ thickening of multiple ileal loops and non-occlusive thrombus, extending to the SMV and main portal vein .continue telemetry Continue IV heparin, continue Coumadin as no anticipated surgery or procedures planned.  NG tube discontinued, continue clear liquids,  very slow to advance Continue aggressive constipation regimen Interventional Radiology and VVS consulted ,no role for surgery for his thrombus, he recommended interventional radiology evaluation for consideration of thrombolytic lysis Interventional radiology does not feel that the patient would need TIPS/thrombolysis or thrombectomy   Ileus Continue MiraLAX twice a day, will also order a Dulcolax suppository start TNA (per pharmacy consult)   CAD, EKG without ACS changes , last cardiac catheterization with stent in 2001 , patient is cardiac pain free at this time. Continue home meds when nausea resolves 2 D echo  shows EF of 91-66%, grade 1 diastolic dysfunction Hydralazine for BP 160/90    UTI suggested by +nitrites in urine. Patient is asymptomatic  for UTI, denies dysuria or gross hematuria  IV Rocephin per pharmacy  Urine culture shows staph coag negative, continued antibiotic due to concerns about intra-abdominal infection We will DC antibiotics tomorrow  Abnormal AST/ALT in the setting of dehydration due to poor oral intake and vomiting due to PVT. No history of Hepatitis AST/ALT 61/66, now 30/39, bilirubin  1.0 Lipase 20        Hyperlipidemia Continue home statins  Type II Diabetes Current blood sugar level is 199 Hgb A1C 8.9, hold metformin Hold home oral diabetic medications.  SSI Accu-Cheks improved on Lantus   DVT prophylaxsis  heparin drip  Code Status:  Full code    Family Communication: Discussed in detail with the patient, all imaging results, lab results explained to the patient   Disposition Plan:  Continue telemetry, disposition per IR and CCS      Consultants:  Interventional radiology  Vascular surgery  General surgery    Procedures:  None  Antibiotics: Anti-infectives    Start     Dose/Rate Route Frequency Ordered Stop   01/21/16 0900  cefTRIAXone (ROCEPHIN) 1 g in dextrose 5 % 50 mL IVPB     1 g 100 mL/hr over 30 Minutes Intravenous Every 24 hours 01/21/16 0827           HPI/Subjective: Patient would like to eat something but feels full after eating small amounts  Objective: Filed Vitals:   01/25/16 1654 01/25/16 2120 01/26/16 0516 01/26/16 0842  BP: 158/77 153/67 136/66 138/72  Pulse: 67 65 62 65  Temp: 98.2 F (36.8 C) 99.1 F (37.3 C) 97.8 F (36.6 C) 98 F (36.7 C)  TempSrc: Oral Oral Oral Oral  Resp: 17 16 16  16  Height:      Weight:  98.703 kg (217 lb 9.6 oz)    SpO2: 97% 96% 97% 97%    Intake/Output Summary (Last 24 hours) at 01/26/16 0943 Last data filed at 01/26/16 0919  Gross per 24 hour  Intake   4322 ml  Output      0 ml  Net   4322 ml    Exam:  Examination:  General exam: Appears calm and comfortable  Respiratory system: Clear to  auscultation. Respiratory effort normal. Cardiovascular system: S1 & S2 heard, RRR. No JVD, murmurs, rubs, gallops or clicks. No pedal edema. Gastrointestinal system: Distended, soft and nontender. No organomegaly or masses felt. Normal bowel sounds heard. Central nervous system: Alert and oriented. No focal neurological deficits. Extremities: Symmetric 5 x 5 power. Skin: No rashes, lesions or ulcers Psychiatry: Judgement and insight appear normal. Mood & affect appropriate.     Data Reviewed: I have personally reviewed following labs and imaging studies  Micro Results Recent Results (from the past 240 hour(s))  Culture, blood (Routine X 2) w Reflex to ID Panel     Status: None (Preliminary result)   Collection Time: 01/21/16 11:37 AM  Result Value Ref Range Status   Specimen Description BLOOD LEFT HAND  Final   Special Requests IN PEDIATRIC BOTTLE 3CC  Final   Culture NO GROWTH 4 DAYS  Final   Report Status PENDING  Incomplete  MRSA PCR Screening     Status: None   Collection Time: 01/21/16 11:40 AM  Result Value Ref Range Status   MRSA by PCR NEGATIVE NEGATIVE Final    Comment:        The GeneXpert MRSA Assay (FDA approved for NASAL specimens only), is one component of a comprehensive MRSA colonization surveillance program. It is not intended to diagnose MRSA infection nor to guide or monitor treatment for MRSA infections.   Culture, blood (Routine X 2) w Reflex to ID Panel     Status: None (Preliminary result)   Collection Time: 01/21/16 11:42 AM  Result Value Ref Range Status   Specimen Description RIGHT ANTECUBITAL  Final   Special Requests BOTTLES DRAWN AEROBIC ONLY 5CC  Final   Culture NO GROWTH 4 DAYS  Final   Report Status PENDING  Incomplete  Urine culture     Status: Abnormal   Collection Time: 01/21/16  2:00 PM  Result Value Ref Range Status   Specimen Description URINE, RANDOM  Final   Special Requests NONE  Final   Culture (A)  Final    40,000  COLONIES/mL STAPHYLOCOCCUS SPECIES (COAGULASE NEGATIVE)   Report Status 01/23/2016 FINAL  Final   Organism ID, Bacteria STAPHYLOCOCCUS SPECIES (COAGULASE NEGATIVE) (A)  Final      Susceptibility   Staphylococcus species (coagulase negative) - MIC*    CIPROFLOXACIN <=0.5 SENSITIVE Sensitive     GENTAMICIN <=0.5 SENSITIVE Sensitive     NITROFURANTOIN <=16 SENSITIVE Sensitive     OXACILLIN <=0.25 SENSITIVE Sensitive     TETRACYCLINE <=1 SENSITIVE Sensitive     VANCOMYCIN 1 SENSITIVE Sensitive     TRIMETH/SULFA <=10 SENSITIVE Sensitive     CLINDAMYCIN <=0.25 SENSITIVE Sensitive     RIFAMPIN <=0.5 SENSITIVE Sensitive     Inducible Clindamycin NEGATIVE Sensitive     * 40,000 COLONIES/mL STAPHYLOCOCCUS SPECIES (COAGULASE NEGATIVE)    Radiology Reports Ct Abdomen Pelvis W Contrast  01/21/2016  CLINICAL DATA:  Right-sided abdominal pain EXAM: CT ABDOMEN AND PELVIS WITH CONTRAST TECHNIQUE: Multidetector CT  imaging of the abdomen and pelvis was performed using the standard protocol following bolus administration of intravenous contrast. CONTRAST:  143m ISOVUE-300 IOPAMIDOL (ISOVUE-300) INJECTION 61% COMPARISON:  07/27/2010 FINDINGS: Lower chest and abdominal wall: Extensive atherosclerotic calcification in the coronary circulation. Previous ventral hernia repair with mesh. Hepatobiliary: Cirrhotic liver. No focal mass lesion is seen. Portal hypertension with small ascites, mild splenomegaly, and venous collaterals.Occlusive portal thrombus seen in ileal branches with nonocclusive thrombus extending into the SMV and main portal vein. This has the appearance of bland thrombus. Pancreas: Unremarkable. Spleen: Unremarkable. Adrenals/Urinary Tract: Negative adrenals. Partial nephrectomy to the upper pole left kidney where a presumed renal cell carcinoma was seen previously. Bilateral renal cysts. No hydronephrosis or stone. Unremarkable bladder. Stomach/Bowel: Submucosal edema causes thickening of multiple  ileal loops with mesenteric fat congestion. No bowel obstruction. Reproductive:Symmetric prostate enlargement. Vascular/Lymphatic: Portal vein thrombus as noted above. Aortic and iliac atherosclerosis. Mild enlargement of deep liver drainage lymph nodes, presumably reactive to the chronic liver disease. Other: Negative Musculoskeletal: No acute abnormalities. Diffuse disc and facet degeneration. IMPRESSION: 1. 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 Nonocclusive thrombus extends into the SMV and main portal veins. 2. Cirrhosis. 3. Small ascites. 4. Unremarkable appearance of left partial nephrectomy site. Electronically Signed   By: JMonte FantasiaM.D.   On: 01/21/2016 05:23   Dg Abd Portable 1v  01/23/2016  CLINICAL DATA:  NG tube placement EXAM: PORTABLE ABDOMEN - 1 VIEW COMPARISON:  CT scan from 2 days ago. FINDINGS: NG tube tip is transpyloric, in the distal descending duodenum. NG tube could be pulled back 9-10 cm to place the tip in the distal stomach. There is mild gaseous distention of small bowel and colon. IMPRESSION: NG tube tip is transpyloric. Electronically Signed   By: EMisty StanleyM.D.   On: 01/23/2016 17:21     CBC  Recent Labs Lab 01/22/16 0335 01/23/16 0347 01/24/16 0248 01/25/16 0520 01/26/16 0520  WBC 22.9* 17.3* 11.2* 7.8 5.7  HGB 14.4 13.8 13.4 12.1* 11.7*  HCT 43.1 42.0 40.7 36.5* 36.2*  PLT 210 193 167 151 142*  MCV 92.7 93.3 92.5 91.3 92.3  MCH 31.0 30.7 30.5 30.3 29.8  MCHC 33.4 32.9 32.9 33.2 32.3  RDW 13.8 14.1 13.9 14.0 13.9  LYMPHSABS  --  2.4  --   --   --   MONOABS  --  1.1*  --   --   --   EOSABS  --  0.1  --   --   --   BASOSABS  --  0.0  --   --   --     Chemistries   Recent Labs Lab 01/21/16 0315 01/22/16 0335 01/23/16 0347 01/24/16 0248 01/25/16 0520 01/26/16 0520  NA 139 141 139 140 140 140  K 3.8 4.0 3.7 3.8 3.7 3.8  CL 107 109 107 113* 112* 111  CO2 26 26 25 24  25 26   GLUCOSE 199* 290* 159* 133* 131* 122*  BUN 15 15 15 13 15 10   CREATININE 0.86 0.96 0.89 0.76 0.74 0.70  CALCIUM 8.9 8.0* 8.1* 8.1* 8.2* 8.3*  AST 61* 32 30 28 28   --   ALT 66* 44 39 33 30  --   ALKPHOS 126 85 86 75 79  --   BILITOT 0.8 0.8 1.0 0.8 0.7  --    ------------------------------------------------------------------------------------------------------------------ estimated creatinine clearance is 108.8 mL/min (by C-G formula based on Cr of 0.7). ------------------------------------------------------------------------------------------------------------------  No results for input(s): HGBA1C in the last 72 hours. ------------------------------------------------------------------------------------------------------------------ No results for input(s): CHOL, HDL, LDLCALC, TRIG, CHOLHDL, LDLDIRECT in the last 72 hours. ------------------------------------------------------------------------------------------------------------------ No results for input(s): TSH, T4TOTAL, T3FREE, THYROIDAB in the last 72 hours.  Invalid input(s): FREET3 ------------------------------------------------------------------------------------------------------------------ No results for input(s): VITAMINB12, FOLATE, FERRITIN, TIBC, IRON, RETICCTPCT in the last 72 hours.  Coagulation profile  Recent Labs Lab 01/22/16 0335 01/25/16 0854 01/26/16 0520  INR 1.48 1.39 1.49    No results for input(s): DDIMER in the last 72 hours.  Cardiac Enzymes No results for input(s): CKMB, TROPONINI, MYOGLOBIN in the last 168 hours.  Invalid input(s): CK ------------------------------------------------------------------------------------------------------------------ Invalid input(s): POCBNP   CBG:  Recent Labs Lab 01/25/16 0733 01/25/16 1205 01/25/16 1653 01/25/16 2117 01/26/16 0743  GLUCAP 129* 167* 152* 191* 97       Studies: No results found.    Lab Results  Component Value Date    HGBA1C 8.9* 01/21/2016   Lab Results  Component Value Date   CREATININE 0.70 01/26/2016       Scheduled Meds: . antiseptic oral rinse  7 mL Mouth Rinse q12n4p  . cefTRIAXone (ROCEPHIN)  IV  1 g Intravenous Q24H  . chlorhexidine  15 mL Mouth Rinse BID  . insulin aspart  0-9 Units Subcutaneous TID WC  . insulin glargine  10 Units Subcutaneous Daily  . isosorbide dinitrate  30 mg Oral TID  . lisinopril  5 mg Oral Daily  . polyethylene glycol  17 g Oral BID  . sodium chloride flush  3 mL Intravenous Q12H  . Warfarin - Pharmacist Dosing Inpatient   Does not apply q1800   Continuous Infusions: . 0.9 % NaCl with KCl 20 mEq / L 100 mL/hr at 01/25/16 1725  . heparin 2,400 Units/hr (01/25/16 0837)     LOS: 5 days    Time spent: >30 MINS    [facility] Pager [phone]. If 7PM-7AM, please contact night-coverage at [email or web address], password [facility] 01/26/2016, 9:43 AM  LOS: 5 days

## 2016-01-26 NOTE — Progress Notes (Signed)
[facility] Progress Note     Subjective: Abdominal pain only after oral intake. Gets full quickly. Denies N/V. Pain medication helps the pain. +flatus. No BM. Urinating without hesitancy. Ambulating.   Objective: Vital signs in last 24 hours: Temp:  [97.8 F (36.6 C)-99.1 F (37.3 C)] 97.8 F (36.6 C) (07/22 0516) Pulse Rate:  [62-67] 62 (07/22 0516) Resp:  [16-17] 16 (07/22 0516) BP: (136-158)/(66-77) 136/66 mmHg (07/22 0516) SpO2:  [96 %-97 %] 97 % (07/22 0516) Weight:  [98.703 kg (217 lb 9.6 oz)] 98.703 kg (217 lb 9.6 oz) (07/21 2120) Last BM Date: 01/25/16  Intake/Output from previous day: 07/21 0701 - 07/22 0700 In: 4082 [P.O.:1440; I.V.:2592; IV Piggyback:50] Out: 0  Intake/Output this shift:    PE: Gen:  Alert, NAD, pleasant Card:  RRR, no M/G/R appreciated  Pulm:  CTA, no W/R/R Abd: Soft, NT,Distended, +BS  Lab Results:   Recent Labs  01/25/16 0520 01/26/16 0520  WBC 7.8 5.7  HGB 12.1* 11.7*  HCT 36.5* 36.2*  PLT 151 142*   BMET  Recent Labs  01/25/16 0520 01/26/16 0520  NA 140 140  K 3.7 3.8  CL 112* 111  CO2 25 26  GLUCOSE 131* 122*  BUN 15 10  CREATININE 0.74 0.70  CALCIUM 8.2* 8.3*   PT/INR  Recent Labs  01/25/16 0854 01/26/16 0520  LABPROT 17.2* 18.1*  INR 1.39 1.49   CMP     Component Value Date/Time   NA 140 01/26/2016 0520   K 3.8 01/26/2016 0520   CL 111 01/26/2016 0520   CO2 26 01/26/2016 0520   GLUCOSE 122* 01/26/2016 0520   BUN 10 01/26/2016 0520   CREATININE 0.70 01/26/2016 0520   CALCIUM 8.3* 01/26/2016 0520   PROT 5.7* 01/25/2016 0520   ALBUMIN 2.3* 01/25/2016 0520   AST 28 01/25/2016 0520   ALT 30 01/25/2016 0520   ALKPHOS 79 01/25/2016 0520   BILITOT 0.7 01/25/2016 0520   GFRNONAA >60 01/26/2016 0520   GFRAA >60 01/26/2016 0520   Lipase     Component Value Date/Time   LIPASE 20 01/22/2016 0330   Studies/Results: No results found.  Anti-infectives: Anti-infectives    Start      Dose/Rate Route Frequency Ordered Stop   01/21/16 0900  cefTRIAXone (ROCEPHIN) 1 g in dextrose 5 % 50 mL IVPB     1 g 100 mL/hr over 30 Minutes Intravenous Every 24 hours 01/21/16 0827       Assessment/Plan Portal Vein Thrombosis - SMV thrombus, edematous bowel on CT - on heparin - NGT 7/19. D/c-ed 7/20 - continue dulcolax suppository/miralax   FEN: clears, start TNA (per pharmacy consult) ID: [email or web address] day #6 Plan: continue non-op mgmt with anticoagulation and abx  Start TNA    LOS: 5 days    Jill Alexanders , [facility] 01/26/2016, 8:38 AM Pager: [phone] Consults: [phone] Mon-Fri 7:00 am-4:30 pm Sat-Sun 7:00 am-11:30 am

## 2016-01-26 NOTE — Progress Notes (Signed)
Peripherally Inserted Central Catheter/Midline Placement  The IV Nurse has discussed with the patient and/or persons authorized to consent for the patient, the purpose of this procedure and the potential benefits and risks involved with this procedure.  The benefits include less needle sticks, lab draws from the catheter and patient may be discharged home with the catheter.  Risks include, but not limited to, infection, bleeding, blood clot (thrombus formation), and puncture of an artery; nerve damage and irregular heat beat.  Alternatives to this procedure were also discussed.  PICC/Midline Placement Documentation  PICC Double Lumen 17/91/50 PICC Right Basilic 42 cm 0 cm (Active)  Indication for Insertion or Continuance of Line Administration of hyperosmolar/irritating solutions (i.e. TPN, Vancomycin, etc.) 01/26/2016 12:43 PM  Exposed Catheter (cm) 0 cm 01/26/2016 12:43 PM  Site Assessment Clean;Dry;Intact 01/26/2016 12:43 PM  Lumen #1 Status Flushed;Saline locked;Blood return noted 01/26/2016 12:43 PM  Lumen #2 Status Flushed;Saline locked;Blood return noted 01/26/2016 12:43 PM  Dressing Type Transparent 01/26/2016 12:43 PM  Dressing Status Clean;Dry;Intact 01/26/2016 12:43 PM  Dressing Change Due 02/02/16 01/26/2016 12:43 PM       [facility] 01/26/2016, 12:44 PM

## 2016-01-26 NOTE — Progress Notes (Addendum)
ANTICOAGULATION CONSULT NOTE  Pharmacy Consult for Heparin and Warfarin Indication: Portal Vein Thrombosis  Labs:  Recent Labs  01/24/16 0248 01/24/16 1651 01/25/16 0520 01/25/16 0854 01/26/16 0520  HGB 13.4  --  12.1*  --  11.7*  HCT 40.7  --  36.5*  --  36.2*  PLT 167  --  151  --  142*  LABPROT  --   --   --  17.2* 18.1*  INR  --   --   --  1.39 1.49  HEPARINUNFRC 0.31 0.29* 0.68  --  0.63  CREATININE 0.76  --  0.74  --  0.70    Estimated Creatinine Clearance: 108.8 mL/min (by C-G formula based on Cr of 0.7).  Assessment: 65 y/o M with portal vein thrombosis on heparin. HL therapeutic this AM.  INR trending up  Goal of Therapy:  Heparin level 0.3-0.7 units/ml Monitor platelets by anticoagulation protocol: Yes  INR 2-3   Plan:  -Continue heparin to 2400 units/hr -Repeat  warfarin 7.19m x1 tonight -Daily HL, CBC, INR  Thank you LAnette Guarneri PharmD [phone] 01/26/2016 10:45 AM

## 2016-01-26 NOTE — Progress Notes (Addendum)
PARENTERAL NUTRITION CONSULT NOTE - INITIAL  Pharmacy Consult for tpn Indication: prolonged ileus  Allergies  Allergen Reactions  . Penicillins Swelling    Swelling at injection site Has patient had a PCN reaction causing immediate rash, facial/tongue/throat swelling, SOB or lightheadedness with hypotension: No Has patient had a PCN reaction causing severe rash involving mucus membranes or skin necrosis: No Has patient had a PCN reaction that required hospitalization No Has patient had a PCN reaction occurring within the last 10 years: No If all of the above answers are "NO", then may proceed with Cephalosporin use.    Patient Measurements: Height: 5' 11"  (180.3 cm) Weight: 217 lb 9.6 oz (98.703 kg) IBW/kg (Calculated) : 75.3 Adjusted Body Weight: 81 kg Usual Weight:  Vital Signs: Temp: 98 F (36.7 C) (07/22 0842) Temp Source: Oral (07/22 0842) BP: 138/72 mmHg (07/22 0842) Pulse Rate: 65 (07/22 0842) Intake/Output from previous day: 07/21 0701 - 07/22 0700 In: 4082 [P.O.:1440; I.V.:2592; IV Piggyback:50] Out: 0  Intake/Output from this shift: Total I/O In: 240 [P.O.:240] Out: 0   Labs:  Recent Labs  01/24/16 0248 01/25/16 0520 01/25/16 0854 01/26/16 0520  WBC 11.2* 7.8  --  5.7  HGB 13.4 12.1*  --  11.7*  HCT 40.7 36.5*  --  36.2*  PLT 167 151  --  142*  INR  --   --  1.39 1.49     Recent Labs  01/24/16 0248 01/25/16 0520 01/26/16 0520  NA 140 140 140  K 3.8 3.7 3.8  CL 113* 112* 111  CO2 24 25 26   GLUCOSE 133* 131* 122*  BUN 13 15 10   CREATININE 0.76 0.74 0.70  CALCIUM 8.1* 8.2* 8.3*  PROT 5.7* 5.7*  --   ALBUMIN 2.4* 2.3*  --   AST 28 28  --   ALT 33 30  --   ALKPHOS 75 79  --   BILITOT 0.8 0.7  --    Estimated Creatinine Clearance: 108.8 mL/min (by C-G formula based on Cr of 0.7).    Recent Labs  01/25/16 1653 01/25/16 2117 01/26/16 0743  GLUCAP 152* 191* 97    Medical History: Past Medical History  Diagnosis Date  . Diabetes  mellitus without complication ([place])   . Hypercholesteremia   . Primary cancer of kidney or ureter   . Hypertension     Medications:  Prescriptions prior to admission  Medication Sig Dispense Refill Last Dose  . acetaminophen (TYLENOL) 325 MG tablet Take 975 mg by mouth 3 (three) times daily as needed for mild pain or fever.   PRN  . alprostadil (MUSE) 1000 MCG pellet 1,000 mcg by Transurethral route See admin instructions. Every 96 hours as needed for erection   PRN  . aspirin 81 MG chewable tablet Chew 81 mg by mouth daily.   01/20/2016  . carvedilol (COREG) 6.25 MG tablet Take 6.25 mg by mouth 2 (two) times daily with a meal.   01/20/2016 at pm  . finasteride (PROSCAR) 5 MG tablet Take 5 mg by mouth daily.   01/20/2016  . glipiZIDE (GLUCOTROL) 5 MG tablet Take 2.5 mg by mouth 2 (two) times daily before a meal.   01/20/2016  . isosorbide dinitrate (ISORDIL) 30 MG tablet Take 30 mg by mouth 3 (three) times daily.   01/20/2016 at Unknown time  . lisinopril (PRINIVIL,ZESTRIL) 5 MG tablet Take 2.5 mg by mouth daily.   01/20/2016  . magnesium oxide (MAG-OX) 400 MG tablet Take 400 mg by mouth  daily.   01/20/2016  . metFORMIN (GLUCOPHAGE) 1000 MG tablet Take 1,000 mg by mouth 2 (two) times daily with a meal.   01/20/2016 at Unknown time  . nitroGLYCERIN (NITROSTAT) 0.4 MG SL tablet Place 0.4 mg under the tongue every 5 (five) minutes as needed for chest pain.   PRN  . omeprazole (PRILOSEC) 20 MG capsule Take 20 mg by mouth every evening.   01/20/2016  . rosuvastatin (CRESTOR) 10 MG tablet Take 5 mg by mouth at bedtime.   01/20/2016  . simethicone (MYLICON) 80 MG chewable tablet Chew 160 mg by mouth 2 (two) times daily as needed for flatulence.   01/20/2016  . traMADol (ULTRAM) 50 MG tablet Take 50 mg by mouth 2 (two) times daily as needed for moderate pain.   PRN   Assessment: 65 y/o male admitted 01/21/2016 with acute onset of N/V and abdominal distension. CT showed SMV thrombus, small portal venous  component, and edematous bowel. Surgery consulted and recommended conservative management with NG decompression and to hold off on surgery.  Surgeries/Procedures: none  Insulin Requirements in the past 24 hours: 3 units of SSI in last 24 hrs  GI: NGT 7/19>> 7/20 but unable to tolerate CLD, +flatus, no BM, albumin 2.3 Endo: A1c 8.9, CBGs 97-191 on SSI, Lantus 10/d Lytes: K 3.8 (goal >= 4 with ileus), Mg 1.6 (goal >= 2 with ileus), CorrCa wnl Renal: SCr 0.7, Phos 2.7 Pulm: RA Cards: VSS Hepatobil: LFTs wnl, Tbili wnl, TG 94 Neuro: wnl ID: [id number] ceftriaxone for UTI, Tm 99.1, WBC wnl AC/heme: heparin/warfarin drip for portal vein thrombosis, Hgb 11.7, plt trended down to 142 Best Practices: heparin/warfarin  TPN Access: PICC 7/22>> TPN start date: 7/22>>  IVF: NS + KCl 20 meq/L @ 75 ml/hr (36 meq KCl/day)  Current Nutrition:  Clear liquid diet  Nutritional Goals:  2100-2400 kCal, 100-120 grams of protein per day Clinimix E 5/15 at 100 ml/hr to provide 120 g protein and 2184 kcal per day  Plan:  Begin Clinimix E 5/15 at 40 ml/hr + IVFE at 10 ml/hr Decrease IVMF (NS + 20 meq K/L) to 35 ml/hr This provides 48 g of protein and 1162 kCals per day meeting 48% of minimal protein and 52% of minimal kCal needs Add MVI and TE in TPN Mg sulfate 2 g IV x1 K Phos 15 mmol IV x1 Change SSI to sensitive q4h and adjust as needed Monitor TPN labs F/U RD recommendations   Renold Genta, PharmD, BCPS Clinical Pharmacist Pager: [phone] 01/26/2016 10:44 AM

## 2016-01-26 NOTE — Progress Notes (Signed)
Initial Nutrition Assessment   INTERVENTION:  TPN per Pharmacy; If pt remains on full liquids recommend providing 75% of calorie needs and 100% of protein needs via TPN.  Diet advancement per MD Monitor PO intake and adequacy, add supplements as needed   NUTRITION DIAGNOSIS:   Inadequate oral intake related to acute illness, other (see comment), altered GI function (early satiety) as evidenced by energy intake < or equal to 50% for > or equal to 5 days, other (see comment) (clear liquid/full liquid diet).   GOAL:   Patient will meet greater than or equal to 90% of their needs   MONITOR:   Diet advancement, Weight trends, Labs, Skin, PO intake  REASON FOR ASSESSMENT:   Consult New TPN/TNA  ASSESSMENT:   65 y.o. male with medical history significant for DM, Hyperlipidemia, Kidney/urether cancer (2001), presenting to the ED with Acute onset of nausea, vomiting and abdominal distention since 1 am. CT showing SMV thrombus, small portal venous component, and edematous bowel  RD consulted for patient starting on TPN. Pt asleep at time of visit. Abdomen noticeably distended. Pt has been on and off of clear liquids since admission on 7/17 and is now on full liquids. Per MD note, pt has reported abdominal pain and early satiety with PO intake. Per nursing notes, pt consumed 75% of breakfast (240 ml) and 100% of lunch (360 ml). Stated weight on admission was 207 lbs; weight now at 217 lbs.   Labs: low calcium, glucose ranging 97 to 239 mg/dL  Diet Order:  Diet full liquid Room service appropriate?: Yes; Fluid consistency:: Thin TPN (CLINIMIX-E) Adult  Skin:  Reviewed, no issues  Last BM:  7/21  Height:   Ht Readings from Last 1 Encounters:  01/24/16 5' 11"  (1.803 m)    Weight:   Wt Readings from Last 1 Encounters:  01/25/16 217 lb 9.6 oz (98.703 kg)    Ideal Body Weight:  78.18 kg  BMI:  Body mass index is 30.36 kg/(m^2).  Estimated Nutritional Needs:   Kcal:   2000-2200  Protein:  100-120 grams  Fluid:  2.6 L/day  EDUCATION NEEDS:   No education needs identified at this time  [place], LDN Inpatient Clinical Dietitian Pager: [phone] After Hours Pager: [phone]

## 2016-01-26 NOTE — Progress Notes (Addendum)
Patient reported that while walking throughout the hospital he stopped in the bathroom to have a bowel movement. After returning to his room, patient had a  full liquid lunch and experienced no pain after. Will continue to monitor. Bartholomew Crews, RN

## 2016-01-26 NOTE — Plan of Care (Signed)
Problem: Education: Goal: Knowledge of [facility] Education information/materials will improve Outcome: Progressing Patient reported having been given coumadin booklet and the he has already viewed coumadin video. Discussed sources of vitamin K and maintaining consistent intake of vitamin K. Reviewed bleeding precautions.

## 2016-01-27 LAB — DIFFERENTIAL
BASOS PCT: 1 %
Basophils Absolute: 0 10*3/uL (ref 0.0–0.1)
Eosinophils Absolute: 0.2 10*3/uL (ref 0.0–0.7)
Eosinophils Relative: 4 %
Lymphocytes Relative: 26 %
Lymphs Abs: 1.5 10*3/uL (ref 0.7–4.0)
MONO ABS: 0.5 10*3/uL (ref 0.1–1.0)
MONOS PCT: 8 %
NEUTROS ABS: 3.5 10*3/uL (ref 1.7–7.7)
Neutrophils Relative %: 61 %

## 2016-01-27 LAB — COMPREHENSIVE METABOLIC PANEL
ALBUMIN: 2.3 g/dL — AB (ref 3.5–5.0)
ALT: 33 U/L (ref 17–63)
ANION GAP: 4 — AB (ref 5–15)
AST: 41 U/L (ref 15–41)
Alkaline Phosphatase: 106 U/L (ref 38–126)
BUN: 8 mg/dL (ref 6–20)
CO2: 24 mmol/L (ref 22–32)
Calcium: 8.3 mg/dL — ABNORMAL LOW (ref 8.9–10.3)
Chloride: 109 mmol/L (ref 101–111)
Creatinine, Ser: 0.59 mg/dL — ABNORMAL LOW (ref 0.61–1.24)
GFR calc non Af Amer: >60 mL/min (ref >60)
GLUCOSE: 155 mg/dL — AB (ref 65–99)
POTASSIUM: 3.8 mmol/L (ref 3.5–5.1)
SODIUM: 137 mmol/L (ref 135–145)
TOTAL PROTEIN: 5.5 g/dL — AB (ref 6.5–8.1)
Total Bilirubin: 0.5 mg/dL (ref 0.3–1.2)

## 2016-01-27 LAB — CBC
HCT: 34.2 % — ABNORMAL LOW (ref 39.0–52.0)
Hemoglobin: 11.4 g/dL — ABNORMAL LOW (ref 13.0–17.0)
MCH: 30.3 pg (ref 26.0–34.0)
MCHC: 33.3 g/dL (ref 30.0–36.0)
MCV: 91 fL (ref 78.0–100.0)
PLATELETS: 142 10*3/uL — AB (ref 150–400)
RBC: 3.76 MIL/uL — ABNORMAL LOW (ref 4.22–5.81)
RDW: 13.8 % (ref 11.5–15.5)
WBC: 5.7 10*3/uL (ref 4.0–10.5)

## 2016-01-27 LAB — GLUCOSE, CAPILLARY
GLUCOSE-CAPILLARY: 149 mg/dL — AB (ref 65–99)
GLUCOSE-CAPILLARY: 166 mg/dL — AB (ref 65–99)
Glucose-Capillary: 142 mg/dL — ABNORMAL HIGH (ref 65–99)
Glucose-Capillary: 184 mg/dL — ABNORMAL HIGH (ref 65–99)
Glucose-Capillary: 164 mg/dL — ABNORMAL HIGH (ref 65–99)
Glucose-Capillary: 265 mg/dL — ABNORMAL HIGH (ref 65–99)

## 2016-01-27 LAB — PHOSPHORUS: PHOSPHORUS: 3.6 mg/dL (ref 2.5–4.6)

## 2016-01-27 LAB — PROTIME-INR
INR: 1.83 — AB (ref 0.00–1.49)
Prothrombin Time: 21.1 seconds — ABNORMAL HIGH (ref 11.6–15.2)

## 2016-01-27 LAB — PREALBUMIN: Prealbumin: 7.6 mg/dL — ABNORMAL LOW (ref 18–38)

## 2016-01-27 LAB — HEPARIN LEVEL (UNFRACTIONATED)
HEPARIN UNFRACTIONATED: 0.99 [IU]/mL — AB (ref 0.30–0.70)
HEPARIN UNFRACTIONATED: 0.65 [IU]/mL (ref 0.30–0.70)

## 2016-01-27 LAB — MAGNESIUM: MAGNESIUM: 1.7 mg/dL (ref 1.7–2.4)

## 2016-01-27 MED ORDER — FAT EMULSION 20 % IV EMUL
240.0000 mL | INTRAVENOUS | Status: AC
  Administered 2016-01-27: 240 mL via INTRAVENOUS
  Filled 2016-01-27: qty 250

## 2016-01-27 MED ORDER — FUROSEMIDE 10 MG/ML IJ SOLN
40.0000 mg | Freq: Once | INTRAMUSCULAR | Status: AC
  Administered 2016-01-27: 40 mg via INTRAVENOUS
  Filled 2016-01-27: qty 4

## 2016-01-27 MED ORDER — MAGNESIUM SULFATE 2 GM/50ML IV SOLN
2.0000 g | Freq: Once | INTRAVENOUS | Status: AC
  Administered 2016-01-27: 2 g via INTRAVENOUS
  Filled 2016-01-27: qty 50

## 2016-01-27 MED ORDER — POTASSIUM CHLORIDE 10 MEQ/50ML IV SOLN
10.0000 meq | INTRAVENOUS | Status: AC
  Administered 2016-01-27 (×3): 10 meq via INTRAVENOUS
  Filled 2016-01-27 (×3): qty 50

## 2016-01-27 MED ORDER — WARFARIN SODIUM 5 MG PO TABS
5.0000 mg | ORAL_TABLET | Freq: Once | ORAL | Status: AC
  Administered 2016-01-27: 5 mg via ORAL
  Filled 2016-01-27: qty 1

## 2016-01-27 MED ORDER — TRACE MINERALS CR-CU-MN-SE-ZN 10-1000-500-60 MCG/ML IV SOLN
INTRAVENOUS | Status: AC
  Administered 2016-01-27: 17:00:00 via INTRAVENOUS
  Filled 2016-01-27: qty 1992

## 2016-01-27 NOTE — Progress Notes (Signed)
[facility] NOTE  Pharmacy Consult for tpn Indication: prolonged ileus  Allergies  Allergen Reactions  . Penicillins Swelling    Swelling at injection site Has patient had a PCN reaction causing immediate rash, facial/tongue/throat swelling, SOB or lightheadedness with hypotension: No Has patient had a PCN reaction causing severe rash involving mucus membranes or skin necrosis: No Has patient had a PCN reaction that required hospitalization No Has patient had a PCN reaction occurring within the last 10 years: No If all of the above answers are "NO", then may proceed with Cephalosporin use.    Patient Measurements: Height: 5' 11"  (180.3 cm) Weight: 222 lb (100.7 kg) IBW/kg (Calculated) : 75.3 Adjusted Body Weight: 81 kg Usual Weight:  Vital Signs: Temp: 99.6 F (37.6 C) (07/23 0915) Temp Source: Oral (07/23 0915) BP: 177/85 (07/23 0915) Pulse Rate: 80 (07/23 0915) Intake/Output from previous day: 07/22 0701 - 07/23 0700 In: 4501.8 [P.O.:1200; I.V.:2388.3; IV Piggyback:355; TPN:558.5] Out: 1200 [Urine:1200] Intake/Output from this shift: Total I/O In: 308.3 [P.O.:240; I.V.:68.3] Out: 0   Labs:  Recent Labs  01/25/16 0520 01/25/16 0854 01/26/16 0520 01/27/16 0445  WBC 7.8  --  5.7 5.7  HGB 12.1*  --  11.7* 11.4*  HCT 36.5*  --  36.2* 34.2*  PLT 151  --  142* 142*  INR  --  1.39 1.49 1.83*     Recent Labs  01/25/16 0520 01/26/16 0520 01/26/16 0608 01/27/16 0445 01/27/16 0725  NA 140 140  --  137  --   K 3.7 3.8  --  3.8  --   CL 112* 111  --  109  --   CO2 25 26  --  24  --   GLUCOSE 131* 122*  --  155*  --   BUN 15 10  --  8  --   CREATININE 0.74 0.70  --  0.59*  --   CALCIUM 8.2* 8.3*  --  8.3*  --   MG  --   --  1.6*  --  1.7  PHOS  --   --  2.7  --  3.6  PROT 5.7*  --   --  5.5*  --   ALBUMIN 2.3*  --   --  2.3*  --   AST 28  --   --  41  --   ALT 30  --   --  33  --   ALKPHOS 79  --   --  106  --   BILITOT 0.7  --   --  0.5   --   PREALBUMIN  --   --   --  7.6*  --   TRIG  --   --  94  --   --    Estimated Creatinine Clearance: 109.8 mL/min (by C-G formula based on SCr of 0.8 mg/dL).    Recent Labs  01/27/16 0005 01/27/16 0536 01/27/16 0738  GLUCAP 166* 149* 142*    Assessment: 65 y/o male admitted 01/21/2016 with acute onset of N/V and abdominal distension. CT showed SMV thrombus, small portal venous component, and edematous bowel. Surgery consulted and recommended conservative management with NG decompression and to hold off on surgery.  Surgeries/Procedures: none  Insulin Requirements in the past 24 hours: 5 units of sensitive SSI in last 24 hrs  GI: NGT 7/19>> 7/20 but was unable to tolerate CLD - RN notes tolerating this morning, +flatus, last BM 7/22, albumin 2.3, prealbumin 7.6 Endo: A1c 8.9, CBGs 97-171  on sensitive SSI, Lantus 10/d Lytes: K 3.8 s/p ~20 meq (goal >= 4 with ileus), Mg 1.7 s/p 2 g (goal >= 2 with ileus), Phos 3.6, CorrCa wnl Renal: SCr 0.59 Pulm: RA Cards: VSS Hepatobil: LFTs wnl, Tbili wnl, TG 94 Neuro: wnl ID: [id number] ceftriaxone for UTI, Tm 99.4, WBC wnl AC/heme: heparin/warfarin drip for portal vein thrombosis, Hgb 11.4, plt trended down to 142 Best Practices: heparin/warfarin  TPN Access: PICC 7/22>> TPN start date: 7/22>>  IVF: NS + KCl 20 meq/L @ 35 ml/hr (16.8 meq KCl/day)  Current Nutrition:  Clear liquid diet (75-100% consumed per charting)  Nutritional Goals: (per RD recommendation 7/22) If remains on full liquids recommend providing 75% of calorie needs and 100% of protein needs via TPN 2000-2200 kCal, 100-120 grams of protein per day Clinimix E 5/15 at 83 ml/hr to provide 100 g protein and 1894 kcal per day  Plan:  Increase Clinimix E 5/15 to 83 ml/hr + IVFE at 10 ml/hr Discontinue MIVF - per Dr. Allyson Sabal This provides 100 g of protein and 1894 kCals per day meeting 100% of minimal protein and 95% of minimal kCal needs Add MVI and TE in TPN Mg sulfate 2 g  IV x1 KCl 10 meq IV q1h x2 Continue sensitive SSI q4h and adjust as needed Monitor TPN labs   Renold Genta, PharmD, BCPS Clinical Pharmacist Pager: [phone] 01/27/2016 9:32 AM

## 2016-01-27 NOTE — Progress Notes (Signed)
ANTICOAGULATION CONSULT NOTE  Pharmacy Consult for Heparin Indication: Portal Vein Thrombosis  Labs:  Recent Labs  01/25/16 0520 01/25/16 0854 01/26/16 0520 01/27/16 0445 01/27/16 1500  HGB 12.1*  --  11.7* 11.4*  --   HCT 36.5*  --  36.2* 34.2*  --   PLT 151  --  142* 142*  --   LABPROT  --  17.2* 18.1* 21.1*  --   INR  --  1.39 1.49 1.83*  --   HEPARINUNFRC 0.68  --  0.63 0.99* 0.65  CREATININE 0.74  --  0.70 0.59*  --     Estimated Creatinine Clearance: 109.8 mL/min (by C-G formula based on SCr of 0.8 mg/dL).  Assessment: 65 y/o M with portal vein thrombosis on heparin.  HL now therapeutic this AM.  INR trending up  Goal of Therapy:  Heparin level 0.3-0.7 units/ml Monitor platelets by anticoagulation protocol: Yes  INR 2-3   Plan:  Continue heparin at 2000 units / hr Follow up AM labs  Thank you Anette Guarneri, PharmD [phone]  01/27/2016 4:08 PM

## 2016-01-27 NOTE — Progress Notes (Signed)
[facility] PROGRESS NOTE  Jerry [name] KQA:[id number] DOB: [birth year] DOA: 01/21/2016   PCP: No primary care provider on file.     Assessment/Plan: Active Problems:   Portal vein thrombosis   Diabetes mellitus (HCC)   CAD (coronary artery disease)   Diabetes mellitus with complication (HCC)   Essential hypertension   Urinary tract infectious disease   65 y.o. male with medical history significant for DM, Hyperlipidemia, Kidney/urether cancer (2001), presenting to the ED with Acute onset of nausea, vomiting and abdominal distention since 1 am.  CT showing SMV thrombus, small portal venous component, and edematous bowel  Assessment and plan   Portal Vein Thrombosis-patient continues to have nausea and abdominal pain progressing very slowly, unable to tolerate clear liquids  CT A/P: portal vein thrombosis with occlusive ileal vein clot causing congestion/ thickening of multiple ileal loops and non-occlusive thrombus, extending to the SMV and main portal vein .continue telemetry Continue IV heparin, continue Coumadin as no anticipated surgery or procedures planned. INR 1.8  NG tube discontinued, continue clear liquids,  very slow to advance, apparently now tolerating full liquids Continue aggressive constipation regimen , had a BM yesterday Interventional Radiology and VVS consulted ,no role for surgery for his thrombus, he recommended interventional radiology evaluation for consideration of thrombolytic lysis Interventional radiology does not feel that the patient would need TIPS/thrombolysis or thrombectomy Continue TPN pending return of bowel function   Ileus Continue MiraLAX twice a day, continue Dulcolax suppository, BM yesterday start TNA (per pharmacy consult) Replete magnesium   CAD, EKG without ACS changes , last cardiac catheterization with stent in 2001 , patient is cardiac pain free at this time. Continue home meds when nausea resolves 2 D echo  shows  EF of 79-43%, grade 1 diastolic dysfunction Hydralazine for BP 160/90    UTI suggested by +nitrites in urine. 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 No history of Hepatitis AST/ALT 41/33,   Lipase 20    Dependent edema Patient complains of swelling in both his feet We'll order some Lasix today   Hyperlipidemia Continue home statins  Type II Diabetes Current blood sugar level is 199 Hgb A1C 8.9, hold metformin Hold home oral diabetic medications.  SSI Accu-Cheks improved on Lantus   DVT prophylaxsis  heparin drip  Code Status:  Full code    Family Communication: Discussed in detail with the patient, all imaging results, lab results explained to the patient   Disposition Plan:  Continue telemetry, disposition per IR and CCS      Consultants:  Interventional radiology  Vascular surgery  General surgery    Procedures:  None  Antibiotics: Anti-infectives    Start     Dose/Rate Route Frequency Ordered Stop   01/21/16 0900  cefTRIAXone (ROCEPHIN) 1 g in dextrose 5 % 50 mL IVPB     1 g 100 mL/hr over 30 Minutes Intravenous Every 24 hours 01/21/16 0827           HPI/Subjective: Ambulating, had 3 bowel movements yesterday, would like to be advanced to a solid diet  Objective: Vitals:   01/26/16 1859 01/26/16 2051 01/27/16 0505 01/27/16 0915  BP: Marland Kitchen)  157/79 133/65 (!) 148/76 (!) 177/85  Pulse: 76 75 66 80  Resp: 18 16 18 19   Temp: 98.7 F (37.1 C) 98.6 F (37 C) 99.4 F (37.4 C) 99.6 F (37.6 C)  TempSrc: Oral Oral Oral Oral  SpO2: 97% 96% 96% 95%  Weight:  100.7 kg (222 lb)    Height:         Intake/Output Summary (Last 24 hours) at 01/27/16 0931 Last data filed at 01/27/16 0916  Gross per 24 hour  Intake             4570 ml  Output             1200 ml  Net             3370 ml    Exam:  Examination:  General exam: Appears calm and comfortable  Respiratory system: Clear to auscultation. Respiratory effort normal. Cardiovascular system: S1 & S2 heard, RRR. No JVD, murmurs, rubs, gallops or clicks. No pedal edema. Gastrointestinal system: Distended, soft and nontender. No organomegaly or masses felt. Normal bowel sounds heard. Central nervous system: Alert and oriented. No focal neurological deficits. Extremities: Symmetric 5 x 5 power., Trace edema Skin: No rashes, lesions or ulcers Psychiatry: Judgement and insight appear normal. Mood & affect appropriate.     Data Reviewed: I have personally reviewed following labs and imaging studies  Micro Results Recent Results (from the past 240 hour(s))  Culture, blood (Routine X 2) w Reflex to ID Panel     Status: None   Collection Time: 01/21/16 11:37 AM  Result Value Ref Range Status   Specimen Description BLOOD LEFT HAND  Final   Special Requests IN PEDIATRIC BOTTLE 3CC  Final   Culture NO GROWTH 5 DAYS  Final   Report Status 01/26/2016 FINAL  Final  MRSA PCR Screening     Status: None   Collection Time: 01/21/16 11:40 AM  Result Value Ref Range Status   MRSA by PCR NEGATIVE NEGATIVE Final    Comment:        The GeneXpert MRSA Assay (FDA approved for NASAL specimens only), is one component of a comprehensive MRSA colonization surveillance program. It is not intended to diagnose MRSA infection nor to guide or monitor treatment for MRSA infections.   Culture, blood (Routine X 2) w Reflex to ID Panel     Status: None   Collection Time: 01/21/16 11:42 AM  Result Value Ref Range Status   Specimen Description RIGHT ANTECUBITAL  Final   Special Requests BOTTLES DRAWN AEROBIC ONLY 5CC  Final   Culture NO GROWTH  5 DAYS  Final   Report Status 01/26/2016 FINAL  Final  Urine culture     Status: Abnormal   Collection Time: 01/21/16  2:00 PM  Result Value Ref Range Status   Specimen Description URINE, RANDOM  Final   Special Requests NONE  Final   Culture (A)  Final    40,000 COLONIES/mL STAPHYLOCOCCUS SPECIES (COAGULASE NEGATIVE)   Report Status 01/23/2016 FINAL  Final   Organism ID, Bacteria STAPHYLOCOCCUS SPECIES (COAGULASE NEGATIVE) (A)  Final      Susceptibility   Staphylococcus species (coagulase negative) - MIC*    CIPROFLOXACIN <=0.5 SENSITIVE Sensitive     GENTAMICIN <=0.5 SENSITIVE Sensitive     NITROFURANTOIN <=16 SENSITIVE Sensitive     OXACILLIN <=0.25 SENSITIVE Sensitive     TETRACYCLINE <=1 SENSITIVE Sensitive     VANCOMYCIN 1 SENSITIVE Sensitive     TRIMETH/SULFA <=10  SENSITIVE Sensitive     CLINDAMYCIN <=0.25 SENSITIVE Sensitive     RIFAMPIN <=0.5 SENSITIVE Sensitive     Inducible Clindamycin NEGATIVE Sensitive     * 40,000 COLONIES/mL STAPHYLOCOCCUS SPECIES (COAGULASE NEGATIVE)    Radiology Reports Ct Abdomen Pelvis W Contrast  Result Date: 01/21/2016 CLINICAL DATA:  Right-sided abdominal pain EXAM: CT ABDOMEN AND PELVIS WITH CONTRAST TECHNIQUE: Multidetector CT imaging of the abdomen and pelvis was performed using the standard protocol following bolus administration of intravenous contrast. CONTRAST:  173m ISOVUE-300 IOPAMIDOL (ISOVUE-300) INJECTION 61% COMPARISON:  07/27/2010 FINDINGS: Lower chest and abdominal wall: Extensive atherosclerotic calcification in the coronary circulation. Previous ventral hernia repair with mesh. Hepatobiliary: Cirrhotic liver. No focal mass lesion is seen. Portal hypertension with small ascites, mild splenomegaly, and venous collaterals.Occlusive portal thrombus seen in ileal branches with nonocclusive thrombus extending into the SMV and main portal vein. This has the appearance of bland thrombus. Pancreas: Unremarkable. Spleen: Unremarkable.  Adrenals/Urinary Tract: Negative adrenals. Partial nephrectomy to the upper pole left kidney where a presumed renal cell carcinoma was seen previously. Bilateral renal cysts. No hydronephrosis or stone. Unremarkable bladder. Stomach/Bowel: Submucosal edema causes thickening of multiple ileal loops with mesenteric fat congestion. No bowel obstruction. Reproductive:Symmetric prostate enlargement. Vascular/Lymphatic: Portal vein thrombus as noted above. Aortic and iliac atherosclerosis. Mild enlargement of deep liver drainage lymph nodes, presumably reactive to the chronic liver disease. Other: Negative Musculoskeletal: No acute abnormalities. Diffuse disc and facet degeneration. IMPRESSION: 1. 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 Nonocclusive thrombus extends into the SMV and main portal veins. 2. Cirrhosis. 3. Small ascites. 4. Unremarkable appearance of left partial nephrectomy site. Electronically Signed   By: JMonte FantasiaM.D.   On: 01/21/2016 05:23   Dg Abd Portable 1v  Result Date: 01/23/2016 CLINICAL DATA:  NG tube placement EXAM: PORTABLE ABDOMEN - 1 VIEW COMPARISON:  CT scan from 2 days ago. FINDINGS: NG tube tip is transpyloric, in the distal descending duodenum. NG tube could be pulled back 9-10 cm to place the tip in the distal stomach. There is mild gaseous distention of small bowel and colon. IMPRESSION: NG tube tip is transpyloric. Electronically Signed   By: EMisty StanleyM.D.   On: 01/23/2016 17:21     CBC  Recent Labs Lab 01/23/16 0347 01/24/16 0248 01/25/16 0520 01/26/16 0520 01/27/16 0445  WBC 17.3* 11.2* 7.8 5.7 5.7  HGB 13.8 13.4 12.1* 11.7* 11.4*  HCT 42.0 40.7 36.5* 36.2* 34.2*  PLT 193 167 151 142* 142*  MCV 93.3 92.5 91.3 92.3 91.0  MCH 30.7 30.5 30.3 29.8 30.3  MCHC 32.9 32.9 33.2 32.3 33.3  RDW 14.1 13.9 14.0 13.9 13.8  LYMPHSABS 2.4  --   --   --  1.5  MONOABS 1.1*  --   --   --  0.5   EOSABS 0.1  --   --   --  0.2  BASOSABS 0.0  --   --   --  0.0    Chemistries   Recent Labs Lab 01/22/16 0335 01/23/16 0347 01/24/16 0248 01/25/16 0520 01/26/16 0520 01/26/16 0608 01/27/16 0445 01/27/16 0725  NA 141 139 140 140 140  --  137  --   K 4.0 3.7 3.8 3.7 3.8  --  3.8  --   CL 109 107 113* 112* 111  --  109  --   CO2 26 25 24 25 26   --  24  --   GLUCOSE  290* 159* 133* 131* 122*  --  155*  --   BUN 15 15 13 15 10   --  8  --   CREATININE 0.96 0.89 0.76 0.74 0.70  --  0.59*  --   CALCIUM 8.0* 8.1* 8.1* 8.2* 8.3*  --  8.3*  --   MG  --   --   --   --   --  1.6*  --  1.7  AST 32 30 28 28   --   --  41  --   ALT 44 39 33 30  --   --  33  --   ALKPHOS 85 86 75 79  --   --  106  --   BILITOT 0.8 1.0 0.8 0.7  --   --  0.5  --    ------------------------------------------------------------------------------------------------------------------ estimated creatinine clearance is 109.8 mL/min (by C-G formula based on SCr of 0.8 mg/dL). ------------------------------------------------------------------------------------------------------------------ No results for input(s): HGBA1C in the last 72 hours. ------------------------------------------------------------------------------------------------------------------  Recent Labs  01/26/16 0608  TRIG 94   ------------------------------------------------------------------------------------------------------------------ No results for input(s): TSH, T4TOTAL, T3FREE, THYROIDAB in the last 72 hours.  Invalid input(s): FREET3 ------------------------------------------------------------------------------------------------------------------ No results for input(s): VITAMINB12, FOLATE, FERRITIN, TIBC, IRON, RETICCTPCT in the last 72 hours.  Coagulation profile  Recent Labs Lab 01/22/16 0335 01/25/16 0854 01/26/16 0520 01/27/16 0445  INR 1.48 1.39 1.49 1.83*    No results for input(s): DDIMER in the last 72  hours.  Cardiac Enzymes No results for input(s): CKMB, TROPONINI, MYOGLOBIN in the last 168 hours.  Invalid input(s): CK ------------------------------------------------------------------------------------------------------------------ Invalid input(s): POCBNP   CBG:  Recent Labs Lab 01/26/16 1635 01/26/16 2045 01/27/16 0005 01/27/16 0536 01/27/16 0738  GLUCAP 144* 171* 166* 149* 142*       Studies: No results found.    Lab Results  Component Value Date   HGBA1C 8.9 (H) 01/21/2016   Lab Results  Component Value Date   CREATININE 0.59 (L) 01/27/2016       Scheduled Meds: . antiseptic oral rinse  7 mL Mouth Rinse q12n4p  . cefTRIAXone (ROCEPHIN)  IV  1 g Intravenous Q24H  . chlorhexidine  15 mL Mouth Rinse BID  . insulin aspart  0-9 Units Subcutaneous Q4H  . insulin glargine  10 Units Subcutaneous Daily  . isosorbide dinitrate  30 mg Oral TID  . lisinopril  5 mg Oral Daily  . polyethylene glycol  17 g Oral BID  . sodium chloride flush  10-40 mL Intracatheter Q12H  . sodium chloride flush  3 mL Intravenous Q12H  . warfarin  5 mg Oral ONCE-1800  . Warfarin - Pharmacist Dosing Inpatient   Does not apply q1800   Continuous Infusions: . 0.9 % NaCl with KCl 20 mEq / L 35 mL/hr at 01/26/16 1748  . Marland KitchenTPN (CLINIMIX-E) Adult 40 mL/hr at 01/26/16 1850   And  . fat emulsion 240 mL (01/27/16 0200)  . heparin 2,000 Units/hr (01/27/16 0618)     LOS: 6 days    Time spent: >30 MINS    [facility] Pager [phone]. If 7PM-7AM, please contact night-coverage at [email or web address], password [facility] 01/27/2016, 9:31 AM  LOS: 6 days

## 2016-01-27 NOTE — Plan of Care (Signed)
Problem: Nutrition: Goal: Adequate nutrition will be maintained Outcome: Progressing Patient is tolerating full liquid diet. TPN/lipids initiated 7/22. Continue to monitor.

## 2016-01-27 NOTE — Progress Notes (Signed)
ANTICOAGULATION CONSULT NOTE  Pharmacy Consult for Heparin and Warfarin Indication: Portal Vein Thrombosis  Labs:  Recent Labs  01/25/16 0520 01/25/16 0854 01/26/16 0520 01/27/16 0445  HGB 12.1*  --  11.7* 11.4*  HCT 36.5*  --  36.2* 34.2*  PLT 151  --  142* 142*  LABPROT  --  17.2* 18.1* 21.1*  INR  --  1.39 1.49 1.83*  HEPARINUNFRC 0.68  --  0.63 0.99*  CREATININE 0.74  --  0.70 0.59*    Estimated Creatinine Clearance: 109.8 mL/min (by C-G formula based on SCr of 0.8 mg/dL).  Assessment: 66 y/o male with portal vein thrombosis for anticoagulation  Goal of Therapy:  Heparin level 0.3-0.7 units/ml Monitor platelets by anticoagulation protocol: Yes  INR 2-3   Plan:  Decrease Heparin 2000 units/hr Check heparin level in 8 hours. Coumadin 5 mg today  Phillis Knack, PharmD, BCPS  01/27/2016 6:16 AM

## 2016-01-27 NOTE — Progress Notes (Signed)
[facility] Progress Note     Subjective: Started TPN yesterday. Tolerating fulls. 3 BMs this morning reported as loose and very dark. Ambulating. Urinating. Denies fever, chills, abdominal pain, nausea, vomiting, hematemesis, hematochezia.   Friends, Vaughan Basta and [employer], in room with patient.  Objective: Vital signs in last 24 hours: Temp:  [98.6 F (37 C)-99.6 F (37.6 C)] 99.6 F (37.6 C) (07/23 0915) Pulse Rate:  [66-80] 80 (07/23 0915) Resp:  [16-19] 19 (07/23 0915) BP: (133-177)/(65-85) 177/85 (07/23 0915) SpO2:  [95 %-97 %] 95 % (07/23 0915) Weight:  [100.7 kg (222 lb)] 100.7 kg (222 lb) (07/22 2051) Last BM Date: 01/27/16  Intake/Output from previous day: 07/22 0701 - 07/23 0700 In: 4501.8 [P.O.:1200; I.V.:2388.3; IV Piggyback:355; TPN:558.5] Out: 1200 [Urine:1200] Intake/Output this shift: Total I/O In: 696.4 [P.O.:240; I.V.:206.4; IV Piggyback:50; TPN:200] Out: 6 [Urine:3; Stool:3]  PE: Gen:  Alert, NAD, pleasant Card:  RRR, no M/G/R appreciated  Pulm:  CTA, no W/R/R Abd: Soft, NT, Very distended (though according to Kathlee Nations, less distended than on admission), +BS  Lab Results:   Recent Labs  01/26/16 0520 01/27/16 0445  WBC 5.7 5.7  HGB 11.7* 11.4*  HCT 36.2* 34.2*  PLT 142* 142*   BMET  Recent Labs  01/26/16 0520 01/27/16 0445  NA 140 137  K 3.8 3.8  CL 111 109  CO2 26 24  GLUCOSE 122* 155*  BUN 10 8  CREATININE 0.70 0.59*  CALCIUM 8.3* 8.3*   PT/INR  Recent Labs  01/26/16 0520 01/27/16 0445  LABPROT 18.1* 21.1*  INR 1.49 1.83*   CMP     Component Value Date/Time   NA 137 01/27/2016 0445   K 3.8 01/27/2016 0445   CL 109 01/27/2016 0445   CO2 24 01/27/2016 0445   GLUCOSE 155 (H) 01/27/2016 0445   BUN 8 01/27/2016 0445   CREATININE 0.59 (L) 01/27/2016 0445   CALCIUM 8.3 (L) 01/27/2016 0445   PROT 5.5 (L) 01/27/2016 0445   ALBUMIN 2.3 (L) 01/27/2016 0445   AST 41 01/27/2016 0445   ALT 33 01/27/2016 0445    ALKPHOS 106 01/27/2016 0445   BILITOT 0.5 01/27/2016 0445   GFRNONAA >60 01/27/2016 0445   GFRAA >60 01/27/2016 0445   Lipase     Component Value Date/Time   LIPASE 20 01/22/2016 0330    Studies/Results: No results found.  Anti-infectives: Anti-infectives    Start     Dose/Rate Route Frequency Ordered Stop   01/21/16 0900  cefTRIAXone (ROCEPHIN) 1 g in dextrose 5 % 50 mL IVPB  Status:  Discontinued     1 g 100 mL/hr over 30 Minutes Intravenous Every 24 hours 01/21/16 0827 01/27/16 0933     Assessment/Plan Portal Vein Thrombosis - SMV thrombus, edematous bowel on CT  - on heparin and coumadin   - NGT 7/19. D/c-ed 7/20  - continue dulcolax suppository/miralax PRN  - PT 21.1, INR 1.83 (01/27/16)   CAD UTI HL DM2  FEN: full liquids, TPN 01/26/16; possible advancement to soft diet tomorrow. ID: Ceftriaxone day #7 Plan: continue non-op mgmt with anticoagulation, abx, and abdominal exams    LOS: 6 days    Jill Alexanders , [facility] 01/27/2016, 10:46 AM Pager: [phone] Consults: [phone]  Agree with above. Looks good - in spite of distended abdomen.  Alphonsa Overall, MD, [facility] Pager: [phone] Office phone:  [phone]

## 2016-01-28 LAB — COMPREHENSIVE METABOLIC PANEL
ALBUMIN: 2.3 g/dL — AB (ref 3.5–5.0)
ALT: 31 U/L (ref 17–63)
ANION GAP: 5 (ref 5–15)
AST: 33 U/L (ref 15–41)
Alkaline Phosphatase: 109 U/L (ref 38–126)
BUN: 12 mg/dL (ref 6–20)
CHLORIDE: 105 mmol/L (ref 101–111)
CO2: 26 mmol/L (ref 22–32)
Calcium: 8.4 mg/dL — ABNORMAL LOW (ref 8.9–10.3)
Creatinine, Ser: 0.63 mg/dL (ref 0.61–1.24)
GFR calc non Af Amer: >60 mL/min (ref >60)
GLUCOSE: 227 mg/dL — AB (ref 65–99)
POTASSIUM: 3.8 mmol/L (ref 3.5–5.1)
SODIUM: 136 mmol/L (ref 135–145)
Total Bilirubin: 0.5 mg/dL (ref 0.3–1.2)
Total Protein: 5.3 g/dL — ABNORMAL LOW (ref 6.5–8.1)

## 2016-01-28 LAB — CBC
HEMATOCRIT: 31.9 % — AB (ref 39.0–52.0)
Hemoglobin: 10.6 g/dL — ABNORMAL LOW (ref 13.0–17.0)
MCH: 30.4 pg (ref 26.0–34.0)
MCHC: 33.2 g/dL (ref 30.0–36.0)
MCV: 91.4 fL (ref 78.0–100.0)
PLATELETS: 143 10*3/uL — AB (ref 150–400)
RBC: 3.49 MIL/uL — AB (ref 4.22–5.81)
RDW: 14 % (ref 11.5–15.5)
WBC: 7.2 10*3/uL (ref 4.0–10.5)

## 2016-01-28 LAB — DIFFERENTIAL
BASOS ABS: 0 10*3/uL (ref 0.0–0.1)
Basophils Relative: 0 %
EOS ABS: 0.2 10*3/uL (ref 0.0–0.7)
EOS PCT: 3 %
LYMPHS ABS: 1.6 10*3/uL (ref 0.7–4.0)
LYMPHS PCT: 22 %
MONO ABS: 0.8 10*3/uL (ref 0.1–1.0)
MONOS PCT: 11 %
NEUTROS PCT: 64 %
Neutro Abs: 4.6 10*3/uL (ref 1.7–7.7)

## 2016-01-28 LAB — PREALBUMIN: PREALBUMIN: 7.8 mg/dL — AB (ref 18–38)

## 2016-01-28 LAB — PROTIME-INR
INR: 1.68 — AB (ref 0.00–1.49)
Prothrombin Time: 19.8 seconds — ABNORMAL HIGH (ref 11.6–15.2)

## 2016-01-28 LAB — TRIGLYCERIDES: Triglycerides: 79 mg/dL (ref <150)

## 2016-01-28 LAB — GLUCOSE, CAPILLARY
GLUCOSE-CAPILLARY: 181 mg/dL — AB (ref 65–99)
Glucose-Capillary: 182 mg/dL — ABNORMAL HIGH (ref 65–99)
Glucose-Capillary: 224 mg/dL — ABNORMAL HIGH (ref 65–99)
Glucose-Capillary: 232 mg/dL — ABNORMAL HIGH (ref 65–99)
Glucose-Capillary: 271 mg/dL — ABNORMAL HIGH (ref 65–99)

## 2016-01-28 LAB — HEPARIN LEVEL (UNFRACTIONATED): HEPARIN UNFRACTIONATED: 0.51 [IU]/mL (ref 0.30–0.70)

## 2016-01-28 LAB — PHOSPHORUS: PHOSPHORUS: 2.9 mg/dL (ref 2.5–4.6)

## 2016-01-28 LAB — MAGNESIUM: Magnesium: 1.6 mg/dL — ABNORMAL LOW (ref 1.7–2.4)

## 2016-01-28 MED ORDER — POTASSIUM CHLORIDE 10 MEQ/50ML IV SOLN
10.0000 meq | INTRAVENOUS | Status: AC
  Administered 2016-01-28 (×2): 10 meq via INTRAVENOUS
  Filled 2016-01-28 (×3): qty 50

## 2016-01-28 MED ORDER — WARFARIN SODIUM 7.5 MG PO TABS
7.5000 mg | ORAL_TABLET | Freq: Once | ORAL | Status: AC
  Administered 2016-01-28: 7.5 mg via ORAL
  Filled 2016-01-28: qty 1

## 2016-01-28 MED ORDER — INSULIN ASPART 100 UNIT/ML ~~LOC~~ SOLN
0.0000 [IU] | Freq: Three times a day (TID) | SUBCUTANEOUS | Status: DC
  Administered 2016-01-29: 3 [IU] via SUBCUTANEOUS
  Administered 2016-01-29: 1 [IU] via SUBCUTANEOUS
  Administered 2016-01-29: 2 [IU] via SUBCUTANEOUS
  Administered 2016-01-30: 1 [IU] via SUBCUTANEOUS
  Administered 2016-01-30: 2 [IU] via SUBCUTANEOUS

## 2016-01-28 MED ORDER — ENOXAPARIN SODIUM 100 MG/ML ~~LOC~~ SOLN
1.0000 mg/kg | Freq: Two times a day (BID) | SUBCUTANEOUS | Status: DC
  Administered 2016-01-28 – 2016-01-30 (×5): 90 mg via SUBCUTANEOUS
  Filled 2016-01-28 (×5): qty 1

## 2016-01-28 MED ORDER — ADULT MULTIVITAMIN W/MINERALS CH
1.0000 | ORAL_TABLET | Freq: Every day | ORAL | Status: DC
  Administered 2016-01-28 – 2016-01-30 (×3): 1 via ORAL
  Filled 2016-01-28 (×3): qty 1

## 2016-01-28 MED ORDER — ALUM & MAG HYDROXIDE-SIMETH 200-200-20 MG/5ML PO SUSP
15.0000 mL | Freq: Four times a day (QID) | ORAL | Status: DC | PRN
  Administered 2016-01-28 – 2016-01-29 (×2): 15 mL via ORAL
  Filled 2016-01-28 (×2): qty 30

## 2016-01-28 MED ORDER — GLUCERNA SHAKE PO LIQD
237.0000 mL | Freq: Two times a day (BID) | ORAL | Status: DC
  Administered 2016-01-29 – 2016-01-30 (×4): 237 mL via ORAL

## 2016-01-28 MED ORDER — MAGNESIUM SULFATE 2 GM/50ML IV SOLN
2.0000 g | Freq: Once | INTRAVENOUS | Status: AC
  Administered 2016-01-28: 2 g via INTRAVENOUS
  Filled 2016-01-28: qty 50

## 2016-01-28 NOTE — Progress Notes (Signed)
Nutrition Follow-up  DOCUMENTATION CODES:   Not applicable  INTERVENTION:  48 hour calorie count has been initiated.   Provide Glucerna Shake po BID, each supplement provides 220 kcal and 10 grams of protein.  RD to continue to monitor.   NUTRITION DIAGNOSIS:   Inadequate oral intake related to acute illness, other (see comment), altered GI function (early satiety) as evidenced by energy intake < or equal to 50% for > or equal to 5 days, other (see comment) (clear liquid/full liquid diet); improving  GOAL:   Patient will meet greater than or equal to 90% of their needs; met  MONITOR:   Diet advancement, Weight trends, Labs, Skin, PO intake  REASON FOR ASSESSMENT:   Consult New TPN/TNA  ASSESSMENT:   65 y.o. male with medical history significant for DM, Hyperlipidemia, Kidney/urether cancer (2001), presenting to the ED with Acute onset of nausea, vomiting and abdominal distention since 1 am. CT showing SMV thrombus, small portal venous component, and edematous bowel  RD consulted for calorie count. RD to order. Pt is currently on a soft diet. Meal completion has been 100%. Pt reports appetite and abdominal pains have improved. Plans to stop TPN today after infusion of current bag. Per Pharmacy note, Clinimix E 5/15 running at 83 ml/hr to provide 100 g protein and 1894 kcal per day. RD to order Glucerna Shake to aid in caloric and protein needs once TPN is stopped. RD to follow up tomorrow with day 1 calorie count results.   Labs and medications reviewed.   Diet Order:  TPN (CLINIMIX-E) Adult DIET SOFT Room service appropriate? Yes; Fluid consistency: Thin  Skin:  Reviewed, no issues  Last BM:  7/23  Height:   Ht Readings from Last 1 Encounters:  01/24/16 5' 11"  (1.803 m)    Weight:   Wt Readings from Last 1 Encounters:  01/27/16 195 lb 6.4 oz (88.6 kg)    Ideal Body Weight:  78.18 kg  BMI:  Body mass index is 27.25 kg/m.  Estimated Nutritional Needs:    Kcal:  2000-2200  Protein:  100-120 grams  Fluid:  2.6 L/day  EDUCATION NEEDS:   No education needs identified at this time  Corrin Parker, MS, RD, LDN Pager # [phone] After hours/ weekend pager # [phone]

## 2016-01-28 NOTE — Progress Notes (Signed)
[place] for warfarin and enoxaparin (change from heparin) Indication: Portal Vein Thrombosis  Labs:  Recent Labs  01/26/16 0520 01/27/16 0445 01/27/16 1500 01/28/16 0610  HGB 11.7* 11.4*  --  10.6*  HCT 36.2* 34.2*  --  31.9*  PLT 142* 142*  --  143*  LABPROT 18.1* 21.1*  --  19.8*  INR 1.49 1.83*  --  1.68*  HEPARINUNFRC 0.63 0.99* 0.65 0.51  CREATININE 0.70 0.59*  --  0.63    Estimated Creatinine Clearance: 96.7 mL/min (by C-G formula based on SCr of 0.8 mg/dL).  Assessment: 65 y/o M with portal vein thrombosis on heparin, now changing to Lovenox, and warfarin. Hgb trending down slightly, f/u repeat cbc in am. INR trending down today. On soft diet. Possibly discharge tomorrow.  Goal of Therapy:  Monitor platelets by anticoagulation protocol: Yes  INR 2-3   Plan:  Discontinue heparin Start enoxaparin 1 mg/kg (90 mg) q 12 hr (Start 1 hour after heparin discontinuation) Give warfarin 7.5 mg PO x 1 dose Monitor daily INR, CBC, clinical course, s/sx of bleed, PO intake, DDI   Thank you for allowing [place] to participate in this patients care. Jens Som, PharmD Phone [phone] 01/28/2016 8:56 AM

## 2016-01-28 NOTE — Progress Notes (Signed)
[place] NOTE  Pharmacy Consult for tpn Indication: prolonged ileus  Allergies  Allergen Reactions  . Penicillins Swelling    Swelling at injection site Has patient had a PCN reaction causing immediate rash, facial/tongue/throat swelling, SOB or lightheadedness with hypotension: No Has patient had a PCN reaction causing severe rash involving mucus membranes or skin necrosis: No Has patient had a PCN reaction that required hospitalization No Has patient had a PCN reaction occurring within the last 10 years: No If all of the above answers are "NO", then may proceed with Cephalosporin use.    Patient Measurements: Height: 5' 11"  (180.3 cm) Weight: 195 lb 6.4 oz (88.6 kg) IBW/kg (Calculated) : 75.3 Adjusted Body Weight: 81 kg  Vital Signs: Temp: 98 F (36.7 C) (07/24 0410) Temp Source: Oral (07/24 0410) BP: 156/67 (07/24 0410) Pulse Rate: 77 (07/24 0410) Intake/Output from previous day: 07/23 0701 - 07/24 0700 In: 3731.3 [P.O.:720; I.V.:1064.1; IV Piggyback:200; TPN:1747.2] Out: 2681 [Urine:2678; Stool:3] Intake/Output from this shift: No intake/output data recorded.  Labs:  Recent Labs  01/26/16 0520 01/27/16 0445 01/28/16 0610  WBC 5.7 5.7 7.2  HGB 11.7* 11.4* 10.6*  HCT 36.2* 34.2* 31.9*  PLT 142* 142* 143*  INR 1.49 1.83* 1.68*     Recent Labs  01/26/16 0520 01/26/16 0608 01/27/16 0445 01/27/16 0725 01/28/16 0610  NA 140  --  137  --  136  K 3.8  --  3.8  --  3.8  CL 111  --  109  --  105  CO2 26  --  24  --  26  GLUCOSE 122*  --  155*  --  227*  BUN 10  --  8  --  12  CREATININE 0.70  --  0.59*  --  0.63  CALCIUM 8.3*  --  8.3*  --  8.4*  MG  --  1.6*  --  1.7 1.6*  PHOS  --  2.7  --  3.6 2.9  PROT  --   --  5.5*  --  5.3*  ALBUMIN  --   --  2.3*  --  2.3*  AST  --   --  41  --  33  ALT  --   --  33  --  31  ALKPHOS  --   --  106  --  109  BILITOT  --   --  0.5  --  0.5  PREALBUMIN  --   --  7.6*  --  7.8*  TRIG  --  94   --   --  79   Estimated Creatinine Clearance: 96.7 mL/min (by C-G formula based on SCr of 0.8 mg/dL).    Recent Labs  01/27/16 2123 01/27/16 2358 01/28/16 0409  GLUCAP 265* 182* 224*    Assessment: 65 y/o male admitted 01/21/2016 with acute onset of N/V and abdominal distension. CT showed SMV thrombus, small portal venous component, and edematous bowel. Surgery consulted and recommended conservative management with NG decompression and to hold off on surgery. TPN started 7/22. Diet advanced to soft 7/23.   Surgeries/Procedures: none  Insulin Requirements in the past 24 hours: 15 units of sensitive SSI in last 24 hrs and 10 units lantus  GI: NGT 7/19>> 7/20 ; diet advanced from full liquid to soft 7/23 at 11 am., albumin 2.3, prealbumin 7.6/7.8; 3 stools on 7/23 Endo: A1c 8.9, CBGs 165 - 224 on sensitive SSI, Lantus 10/d, 15 units SSI given in past 24 hours Lytes:  given lasix 40 mg yesterday at 1235;  K 3.8 s/p 3 runs of K yesterday (goal >= 4 with ileus), Mg 1.6 s/p 2 g (goal >= 2 with ileus), Phos 2.9 CorrCa wnl Renal: SCr wnl Pulm: RA Cards: VSS Hepatobil: LFTs wnl, Tbili wnl, TG 79 Neuro: wnl ID: [id number] ceftriaxone for UTI,  WBC wnl AC/heme: heparin/warfarin drip for portal vein thrombosis,  Best Practices: heparin/warfarin  TPN Access: PICC 7/22>> TPN start date: 7/22>>  Current Nutrition:  Soft diet   Nutritional Goals: (per RD recommendation 7/22) If remains on full liquids recommend providing 75% of calorie needs and 100% of protein needs via TPN 2000-2200 kCal, 100-120 grams of protein per day Clinimix E 5/15 at 83 ml/hr to provide 100 g protein and 1894 kcal per day  Plan:  D/w  surgery stop TPN after this bag infused (around 1800 tonight)  change MVI to PO as pt taking po meds Repeat Mg sulfate 2 g IV x1 KCl 10 meq IV q1h x 3 continue sensitive SSI q4h and lantus 10 qday DC TPN labs Will sign off TPN protocol  Eudelia Bunch, Pharm.D. [phone] 01/28/2016  8:01 AM

## 2016-01-28 NOTE — Progress Notes (Signed)
[facility] Progress Note     Subjective: C/o right leg/hip pain worse with active movement in and out of bed. Denies abdominal pain, nausea, vomiting. Last BM was this morning at 0400 and was dark "like coal". Denies PMH bleeding gastric ulcers. +flatus, but not as much as two days ago, requesting gas pill. Tolerating PO.  Objective: Vital signs in last 24 hours: Temp:  [98 F (36.7 C)-100 F (37.8 C)] 99.1 F (37.3 C) (07/24 0741) Pulse Rate:  [77-97] 80 (07/24 0741) Resp:  [16-19] 18 (07/24 0741) BP: (152-169)/(67-73) 152/73 (07/24 0741) SpO2:  [95 %-97 %] 96 % (07/24 0741) Weight:  [88.6 kg (195 lb 6.4 oz)] 88.6 kg (195 lb 6.4 oz) (07/23 2125) Last BM Date: 01/27/16  Intake/Output from previous day: 07/23 0701 - 07/24 0700 In: 3731.3 [P.O.:720; I.V.:1064.1; IV Piggyback:200; TPN:1747.2] Out: 2681 [Urine:2678; Stool:3] Intake/Output this shift: Total I/O In: 120 [P.O.:120] Out: 0   PE: Gen: Alert, NAD, pleasant Card: RRR, no M/G/R appreciated  Pulm: CTA, no W/R/R Abd: Soft, NT, Very distended, +BS Msk: right leg and hip strength 5/5. Passive and active ROM in tact. Active ROM elicits hip pain  Lab Results:   Recent Labs  01/27/16 0445 01/28/16 0610  WBC 5.7 7.2  HGB 11.4* 10.6*  HCT 34.2* 31.9*  PLT 142* 143*   BMET  Recent Labs  01/27/16 0445 01/28/16 0610  NA 137 136  K 3.8 3.8  CL 109 105  CO2 24 26  GLUCOSE 155* 227*  BUN 8 12  CREATININE 0.59* 0.63  CALCIUM 8.3* 8.4*   PT/INR  Recent Labs  01/27/16 0445 01/28/16 0610  LABPROT 21.1* 19.8*  INR 1.83* 1.68*   CMP     Component Value Date/Time   NA 136 01/28/2016 0610   K 3.8 01/28/2016 0610   CL 105 01/28/2016 0610   CO2 26 01/28/2016 0610   GLUCOSE 227 (H) 01/28/2016 0610   BUN 12 01/28/2016 0610   CREATININE 0.63 01/28/2016 0610   CALCIUM 8.4 (L) 01/28/2016 0610   PROT 5.3 (L) 01/28/2016 0610   ALBUMIN 2.3 (L) 01/28/2016 0610   AST 33 01/28/2016 0610   ALT 31  01/28/2016 0610   ALKPHOS 109 01/28/2016 0610   BILITOT 0.5 01/28/2016 0610   GFRNONAA >60 01/28/2016 0610   GFRAA >60 01/28/2016 0610   Lipase     Component Value Date/Time   LIPASE 20 01/22/2016 0330   Studies/Results: No results found.  Anti-infectives: Anti-infectives    Start     Dose/Rate Route Frequency Ordered Stop   01/21/16 0900  cefTRIAXone (ROCEPHIN) 1 g in dextrose 5 % 50 mL IVPB  Status:  Discontinued     1 g 100 mL/hr over 30 Minutes Intravenous Every 24 hours 01/21/16 0827 01/27/16 0933     Assessment/Plan Portal Vein Thrombosis - SMV thrombus, edematous bowel on CT      - on heparin and coumadin       - NGT 7/19. D/c-ed 7/20      - continue dulcolax suppository/miralax PRN, add Maalox       - INR 1.68 (goal 2-3)       - hgb 10.6 (11.4 yesterday)  Melena: - low suspicion for upper GI bleed/ulcer - colonoscopy and EGD 6 months ago were normal - Hgb dropped < 1g/dL yesterday - repeat CBC in AM  CAD UTI HL DM2  FEN: soft diet, TPN 01/26/16-7-24/17 d/c after tonights bag finishes at 6:00PM ID: [email or web address] 7/7 days  completed Plan: continue soft diet and abdominal exams Possible discharge tomorrow or wednesday on anticoagulants if tolerates diet and normal bowel function.   LOS: 7 days    Jerry [name] , [facility] 01/28/2016, 9:26 AM Pager: [phone] Consults: [phone] Mon-Fri 7:00 am-4:30 pm Sat-Sun 7:00 am-11:30 am

## 2016-01-28 NOTE — Progress Notes (Signed)
[facility] PROGRESS NOTE  Jerry [name] ZDG:[id number] DOB: 1950/09/28 DOA: 01/21/2016   PCP: No primary care provider on file.     Assessment/Plan: Active Problems:   Portal vein thrombosis   Diabetes mellitus (HCC)   CAD (coronary artery disease)   Diabetes mellitus with complication (HCC)   Essential hypertension   Urinary tract infectious disease   65 y.o. male with medical history significant for DM, Hyperlipidemia, Kidney/urether cancer (2001), presenting to the ED with Acute onset of nausea, vomiting and abdominal distention since 1 am.  CT showing SMV thrombus, small portal venous component, and edematous bowel  Assessment and plan   Portal Vein Thrombosis-patient continues to have nausea and abdominal pain progressing very slowly, unable to tolerate clear liquids  CT A/P: portal vein thrombosis with occlusive ileal vein clot causing congestion/ thickening of multiple ileal loops and non-occlusive thrombus, extending to the SMV and main portal vein .continue telemetry Continue IV heparin, continue Coumadin as no anticipated surgery or procedures planned. INR 1.8  NG tube discontinued, advance to soft diet 7/23 Continue aggressive constipation regimen ,  had 3 bowel movements Interventional Radiology and [facility] consulted ,no role for surgery for his thrombus, he recommended interventional radiology evaluation for consideration of thrombolytic lysis Interventional radiology does not feel that the patient would need TIPS/thrombolysis or thrombectomy Started on TPN, pending return of bowel function,   Melena Patient had EGD colonoscopy 6 months ago Hemoglobin drop from 11.7-10.6 in 2 days on the heparin drip Could have possible sloughing office Into stance due to the thrombosis however no concerning for active ongoing GI bleeding Continue to monitor CBC   Ileus, Slowly improving  Continue MiraLAX twice a day, continue Dulcolax suppository, BM yesterday start TNA  (per pharmacy consult) Replete magnesium   CAD, EKG without ACS changes , last cardiac catheterization with stent in 2001 , patient is cardiac pain free at this time. Continue home meds when nausea resolves 2 D echo  shows EF of 59-56%, grade 1 diastolic dysfunction Hydralazine for BP 160/90    UTI suggested by +nitrites in urine. 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 No history of Hepatitis AST/ALT 41/33,   Lipase 20    Dependent edema Patient complains of swelling in both his feet We'll order some Lasix today   Hyperlipidemia Continue home statins  Type II Diabetes Current blood sugar level is 199 Hgb A1C 8.9, hold metformin Hold home oral diabetic medications.  SSI Accu-Cheks improved on Lantus   DVT prophylaxsis  heparin drip  Code Status:  Full code    Family Communication: Discussed in detail with the patient, all imaging results, lab results explained to the patient   Disposition Plan:  Anticipate he'll discharge in the next 2-3 days    Consultants:  Interventional radiology  Vascular surgery  General surgery    Procedures:  None  Antibiotics: Anti-infectives    Start     Dose/Rate Route Frequency Ordered Stop   01/21/16 0900  cefTRIAXone (ROCEPHIN) 1 g in dextrose 5 % 50 mL IVPB  Status:  Discontinued     1 g 100 mL/hr over 30 Minutes Intravenous Every 24 hours  01/21/16 0827 01/27/16 0933         HPI/Subjective: Patient had dark-colored stool, concerned about recurrent bleeding, tolerating a soft diet but very slow to advance   Objective: Vitals:   01/27/16 1839 01/27/16 2125 01/28/16 0410  01/28/16 0741  BP: (!) 163/67 (!) 169/72 (!) 156/67 (!) 152/73  Pulse: 79 97 77 80  Resp: 16 18 19 18   Temp: 99.4 F (37.4 C) 100 F (37.8 C) 98 F (36.7 C) 99.1 F (37.3 C)  TempSrc: Oral Oral Oral Oral  SpO2: 97% 95% 96% 96%  Weight:  88.6 kg (195 lb 6.4 oz)    Height:        Intake/Output Summary (Last 24 hours) at 01/28/16 1045 Last data filed at 01/28/16 1016  Gross per 24 hour  Intake          3274.92 ml  Output             2675 ml  Net           599.92 ml    Exam:  Examination:  General exam: Appears calm and comfortable  Respiratory system: Clear to auscultation. Respiratory effort normal. Cardiovascular system: S1 & S2 heard, RRR. No JVD, murmurs, rubs, gallops or clicks. No pedal edema. Gastrointestinal system: Distended, soft and nontender. No organomegaly or masses felt. Normal bowel sounds heard. Central nervous system: Alert and oriented. No focal neurological deficits. Extremities: Symmetric 5 x 5 power., Trace edema Skin: No rashes, lesions or ulcers Psychiatry: Judgement and insight appear normal. Mood & affect appropriate.     Data Reviewed: I have personally reviewed following labs and imaging studies  Micro Results Recent Results (from the past 240 hour(s))  Culture, blood (Routine X 2) w Reflex to ID Panel     Status: None   Collection Time: 01/21/16 11:37 AM  Result Value Ref Range Status   Specimen Description BLOOD LEFT HAND  Final   Special Requests IN PEDIATRIC BOTTLE 3CC  Final   Culture NO GROWTH 5 DAYS  Final   Report Status 01/26/2016 FINAL  Final  MRSA PCR Screening     Status: None   Collection Time: 01/21/16 11:40 AM  Result Value Ref Range Status   MRSA by PCR NEGATIVE NEGATIVE Final    Comment:        The GeneXpert MRSA Assay (FDA approved for NASAL specimens only), is one component of a comprehensive MRSA colonization surveillance program. It is not intended to diagnose MRSA infection nor to guide or monitor treatment  for MRSA infections.   Culture, blood (Routine X 2) w Reflex to ID Panel     Status: None   Collection Time: 01/21/16 11:42 AM  Result Value Ref Range Status   Specimen Description RIGHT ANTECUBITAL  Final   Special Requests BOTTLES DRAWN AEROBIC ONLY 5CC  Final   Culture NO GROWTH 5 DAYS  Final   Report Status 01/26/2016 FINAL  Final  Urine culture     Status: Abnormal   Collection Time: 01/21/16  2:00 PM  Result Value Ref Range Status   Specimen Description URINE, RANDOM  Final   Special Requests NONE  Final   Culture (A)  Final    40,000 COLONIES/mL STAPHYLOCOCCUS SPECIES (COAGULASE NEGATIVE)   Report Status 01/23/2016 FINAL  Final   Organism ID, Bacteria STAPHYLOCOCCUS SPECIES (COAGULASE NEGATIVE) (A)  Final      Susceptibility   Staphylococcus species (coagulase negative) - MIC*    CIPROFLOXACIN <=0.5 SENSITIVE Sensitive  GENTAMICIN <=0.5 SENSITIVE Sensitive     NITROFURANTOIN <=16 SENSITIVE Sensitive     OXACILLIN <=0.25 SENSITIVE Sensitive     TETRACYCLINE <=1 SENSITIVE Sensitive     VANCOMYCIN 1 SENSITIVE Sensitive     TRIMETH/SULFA <=10 SENSITIVE Sensitive     CLINDAMYCIN <=0.25 SENSITIVE Sensitive     RIFAMPIN <=0.5 SENSITIVE Sensitive     Inducible Clindamycin NEGATIVE Sensitive     * 40,000 COLONIES/mL STAPHYLOCOCCUS SPECIES (COAGULASE NEGATIVE)    Radiology Reports Ct Abdomen Pelvis W Contrast  Result Date: 01/21/2016 CLINICAL DATA:  Right-sided abdominal pain EXAM: CT ABDOMEN AND PELVIS WITH CONTRAST TECHNIQUE: Multidetector CT imaging of the abdomen and pelvis was performed using the standard protocol following bolus administration of intravenous contrast. CONTRAST:  130m ISOVUE-300 IOPAMIDOL (ISOVUE-300) INJECTION 61% COMPARISON:  07/27/2010 FINDINGS: Lower chest and abdominal wall: Extensive atherosclerotic calcification in the coronary circulation. Previous ventral hernia repair with mesh. Hepatobiliary: Cirrhotic liver. No focal mass lesion is seen. Portal  hypertension with small ascites, mild splenomegaly, and venous collaterals.Occlusive portal thrombus seen in ileal branches with nonocclusive thrombus extending into the SMV and main portal vein. This has the appearance of bland thrombus. Pancreas: Unremarkable. Spleen: Unremarkable. Adrenals/Urinary Tract: Negative adrenals. Partial nephrectomy to the upper pole left kidney where a presumed renal cell carcinoma was seen previously. Bilateral renal cysts. No hydronephrosis or stone. Unremarkable bladder. Stomach/Bowel: Submucosal edema causes thickening of multiple ileal loops with mesenteric fat congestion. No bowel obstruction. Reproductive:Symmetric prostate enlargement. Vascular/Lymphatic: Portal vein thrombus as noted above. Aortic and iliac atherosclerosis. Mild enlargement of deep liver drainage lymph nodes, presumably reactive to the chronic liver disease. Other: Negative Musculoskeletal: No acute abnormalities. Diffuse disc and facet degeneration. IMPRESSION: 1. 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 Nonocclusive thrombus extends into the SMV and main portal veins. 2. Cirrhosis. 3. Small ascites. 4. Unremarkable appearance of left partial nephrectomy site. Electronically Signed   By: JMonte FantasiaM.D.   On: 01/21/2016 05:23   Dg Abd Portable 1v  Result Date: 01/23/2016 CLINICAL DATA:  NG tube placement EXAM: PORTABLE ABDOMEN - 1 VIEW COMPARISON:  CT scan from 2 days ago. FINDINGS: NG tube tip is transpyloric, in the distal descending duodenum. NG tube could be pulled back 9-10 cm to place the tip in the distal stomach. There is mild gaseous distention of small bowel and colon. IMPRESSION: NG tube tip is transpyloric. Electronically Signed   By: EMisty StanleyM.D.   On: 01/23/2016 17:21     CBC  Recent Labs Lab 01/23/16 0347 01/24/16 0248 01/25/16 0520 01/26/16 0520 01/27/16 0445 01/28/16 0610  WBC 17.3* 11.2* 7.8 5.7  5.7 7.2  HGB 13.8 13.4 12.1* 11.7* 11.4* 10.6*  HCT 42.0 40.7 36.5* 36.2* 34.2* 31.9*  PLT 193 167 151 142* 142* 143*  MCV 93.3 92.5 91.3 92.3 91.0 91.4  MCH 30.7 30.5 30.3 29.8 30.3 30.4  MCHC 32.9 32.9 33.2 32.3 33.3 33.2  RDW 14.1 13.9 14.0 13.9 13.8 14.0  LYMPHSABS 2.4  --   --   --  1.5 1.6  MONOABS 1.1*  --   --   --  0.5 0.8  EOSABS 0.1  --   --   --  0.2 0.2  BASOSABS 0.0  --   --   --  0.0 0.0    Chemistries   Recent Labs Lab 01/23/16 0347 01/24/16 0248 01/25/16 0520 01/26/16 0520 01/26/16 0629407/23/17 0445 01/27/16 0725 01/28/16 0610  NA  139 140 140 140  --  137  --  136  K 3.7 3.8 3.7 3.8  --  3.8  --  3.8  CL 107 113* 112* 111  --  109  --  105  CO2 25 24 25 26   --  24  --  26  GLUCOSE 159* 133* 131* 122*  --  155*  --  227*  BUN 15 13 15 10   --  8  --  12  CREATININE 0.89 0.76 0.74 0.70  --  0.59*  --  0.63  CALCIUM 8.1* 8.1* 8.2* 8.3*  --  8.3*  --  8.4*  MG  --   --   --   --  1.6*  --  1.7 1.6*  AST 30 28 28   --   --  41  --  33  ALT 39 33 30  --   --  33  --  31  ALKPHOS 86 75 79  --   --  106  --  109  BILITOT 1.0 0.8 0.7  --   --  0.5  --  0.5   ------------------------------------------------------------------------------------------------------------------ estimated creatinine clearance is 96.7 mL/min (by C-G formula based on SCr of 0.8 mg/dL). ------------------------------------------------------------------------------------------------------------------ No results for input(s): HGBA1C in the last 72 hours. ------------------------------------------------------------------------------------------------------------------  Recent Labs  01/26/16 0608 01/28/16 0610  TRIG 94 79   ------------------------------------------------------------------------------------------------------------------ No results for input(s): TSH, T4TOTAL, T3FREE, THYROIDAB in the last 72 hours.  Invalid input(s):  FREET3 ------------------------------------------------------------------------------------------------------------------ No results for input(s): VITAMINB12, FOLATE, FERRITIN, TIBC, IRON, RETICCTPCT in the last 72 hours.  Coagulation profile  Recent Labs Lab 01/22/16 0335 01/25/16 0854 01/26/16 0520 01/27/16 0445 01/28/16 0610  INR 1.48 1.39 1.49 1.83* 1.68*    No results for input(s): DDIMER in the last 72 hours.  Cardiac Enzymes No results for input(s): CKMB, TROPONINI, MYOGLOBIN in the last 168 hours.  Invalid input(s): CK ------------------------------------------------------------------------------------------------------------------ Invalid input(s): POCBNP   CBG:  Recent Labs Lab 01/27/16 1617 01/27/16 2123 01/27/16 2358 01/28/16 0409 01/28/16 0744  GLUCAP 164* 265* 182* 224* 181*       Studies: No results found.    Lab Results  Component Value Date   HGBA1C 8.9 (H) 01/21/2016   Lab Results  Component Value Date   CREATININE 0.63 01/28/2016       Scheduled Meds: . antiseptic oral rinse  7 mL Mouth Rinse q12n4p  . chlorhexidine  15 mL Mouth Rinse BID  . enoxaparin (LOVENOX) injection  1 mg/kg Subcutaneous Q12H  . insulin aspart  0-9 Units Subcutaneous Q4H  . insulin glargine  10 Units Subcutaneous Daily  . isosorbide dinitrate  30 mg Oral TID  . lisinopril  5 mg Oral Daily  . magnesium sulfate 1 - 4 g bolus IVPB  2 g Intravenous Once  . multivitamin with minerals  1 tablet Oral Daily  . polyethylene glycol  17 g Oral BID  . potassium chloride  10 mEq Intravenous Q1 Hr x 3  . sodium chloride flush  10-40 mL Intracatheter Q12H  . sodium chloride flush  3 mL Intravenous Q12H  . warfarin  7.5 mg Oral ONCE-1800  . Warfarin - Pharmacist Dosing Inpatient   Does not apply q1800   Continuous Infusions: . Marland KitchenTPN (CLINIMIX-E) Adult 83 mL/hr at 01/27/16 1709   And  . fat emulsion 240 mL (01/27/16 1709)     LOS: 7 days    Time spent: >30 MINS     [facility]  [facility]  Pager [phone]. If 7PM-7AM, please contact night-coverage at [email or web address], password [facility] 01/28/2016, 10:45 AM  LOS: 7 days

## 2016-01-28 NOTE — Progress Notes (Signed)
Pt with complaints R thigh pain. Pt concerned he may have pulled a muscle getting in and out of the bed. Medicated with Vicodin per order. Will continue to monitor. Dorthey Sawyer, RN

## 2016-01-28 NOTE — Care Management Important Message (Signed)
Important Message  Patient Details  Name: Jerry [name] MRN: [id number] Date of Birth: [birth year]   Medicare Important Message Given:  Yes    Loann Quill 01/28/2016, 9:08 AM

## 2016-01-28 NOTE — Progress Notes (Signed)
Heparin Drip stopped per Pharmacy. Pt is to start on Lovenox. Renal Doc in pt room and aware.

## 2016-01-29 ENCOUNTER — Inpatient Hospital Stay (HOSPITAL_COMMUNITY): Payer: Non-veteran care

## 2016-01-29 DIAGNOSIS — R509 Fever, unspecified: Secondary | ICD-10-CM

## 2016-01-29 LAB — CBC
HCT: 30.5 % — ABNORMAL LOW (ref 39.0–52.0)
Hemoglobin: 10 g/dL — ABNORMAL LOW (ref 13.0–17.0)
MCH: 29.9 pg (ref 26.0–34.0)
MCHC: 32.8 g/dL (ref 30.0–36.0)
MCV: 91 fL (ref 78.0–100.0)
PLATELETS: 143 10*3/uL — AB (ref 150–400)
RBC: 3.35 MIL/uL — AB (ref 4.22–5.81)
RDW: 14.1 % (ref 11.5–15.5)
WBC: 7.4 10*3/uL (ref 4.0–10.5)

## 2016-01-29 LAB — BASIC METABOLIC PANEL
Anion gap: 3 — ABNORMAL LOW (ref 5–15)
BUN: 10 mg/dL (ref 6–20)
CALCIUM: 8.4 mg/dL — AB (ref 8.9–10.3)
CO2: 28 mmol/L (ref 22–32)
CREATININE: 0.68 mg/dL (ref 0.61–1.24)
Chloride: 105 mmol/L (ref 101–111)
GFR calc non Af Amer: >60 mL/min (ref >60)
Glucose, Bld: 143 mg/dL — ABNORMAL HIGH (ref 65–99)
Potassium: 4.2 mmol/L (ref 3.5–5.1)
Sodium: 136 mmol/L (ref 135–145)

## 2016-01-29 LAB — GLUCOSE, CAPILLARY
GLUCOSE-CAPILLARY: 141 mg/dL — AB (ref 65–99)
Glucose-Capillary: 209 mg/dL — ABNORMAL HIGH (ref 65–99)
Glucose-Capillary: 195 mg/dL — ABNORMAL HIGH (ref 65–99)
Glucose-Capillary: 221 mg/dL — ABNORMAL HIGH (ref 65–99)
Glucose-Capillary: 200 mg/dL — ABNORMAL HIGH (ref 65–99)

## 2016-01-29 LAB — PROTIME-INR
INR: 1.55 — ABNORMAL HIGH (ref 0.00–1.49)
PROTHROMBIN TIME: 18.7 s — AB (ref 11.6–15.2)

## 2016-01-29 MED ORDER — CEFTRIAXONE SODIUM 2 G IJ SOLR
2.0000 g | INTRAMUSCULAR | Status: DC
  Administered 2016-01-29 – 2016-01-30 (×2): 2 g via INTRAVENOUS
  Filled 2016-01-29 (×2): qty 2

## 2016-01-29 MED ORDER — WARFARIN SODIUM 6 MG PO TABS
9.0000 mg | ORAL_TABLET | Freq: Once | ORAL | Status: AC
  Administered 2016-01-29: 9 mg via ORAL
  Filled 2016-01-29: qty 1

## 2016-01-29 NOTE — Progress Notes (Signed)
Calorie Count Note  48 hour calorie count ordered.  Diet: Soft diet Supplements: Glucerna Shake po BID, each supplement provides 220 kcal and 10 grams of protein.  Breakfast: 637 kcal, 26 grams of protein Lunch: 585 kcal, 15 grams of protein Dinner: No information provided, no meal ticket saved Supplements: 440 kcal, and 20 grams of protein   Total intake : (Noted no information at dinner provided, thus not included in calculations) 1662 kcal (83% of minimum estimated needs)  61 grams of protein (61% of minimum estimated needs)  Estimated Nutritional Needs:  Kcal:  2000-2200 Protein:  100-120 grams Fluid:  >/=2 L/day  Pt reports appetite and abdominal pains have improved. Pt has been tolerating his soft diet and Glucerna Shakes. Meal completion has been 100%. Noted no information at dinner provided thus calorie count intake is not 100% accurate. Nutrition intake however is adequate as pt has been eating all of his foods at meals and consuming his supplements. Pt reports plans for discharge tomorrow. Recommend continuation of nutritional supplementation at home especially if po intake becomes poor.   Nutrition Dx: Inadequate oral intake related to acute illness, other (see comment), altered GI function (early satiety) as evidenced by energy intake < or equal to 50% for > or equal to 5 days, other (see comment) (clear liquid/full liquid diet); improved  Goal:  Pt to meet >/= 90% of their estimated nutrition needs; met  Intervention:   Discontinue calorie count as intake has been adequate (PO 100% at meals).  Continue Glucerna Shake po BID, each supplement provides 220 kcal and 10 grams of protein  Recommend continuation of nutritional supplementation post discharge especially if po intake is poor.   Corrin Parker, MS, RD, LDN Pager # [phone] After hours/ weekend pager # [phone]

## 2016-01-29 NOTE — Progress Notes (Signed)
[place] for warfarin and enoxaparin (change from heparin) Indication: Portal Vein Thrombosis  Labs:  Recent Labs  01/27/16 0445 01/27/16 1500 01/28/16 0610 01/29/16 0425  HGB 11.4*  --  10.6* 10.0*  HCT 34.2*  --  31.9* 30.5*  PLT 142*  --  143* 143*  LABPROT 21.1*  --  19.8* 18.7*  INR 1.83*  --  1.68* 1.55*  HEPARINUNFRC 0.99* 0.65 0.51  --   CREATININE 0.59*  --  0.63 0.68    Estimated Creatinine Clearance: 96.7 mL/min (by C-G formula based on SCr of 0.8 mg/dL).  Assessment: 65 y/o M with portal vein thrombosis on Lovenox and warfarin, previously on heparin. Hgb continuing to trend down slightly, melena reported 7/24, MD aware. Follow CBC closely. INR down again today. Transitioned from TPA to PO diet.  Goal of Therapy:  Monitor platelets by anticoagulation protocol: Yes  INR 2-3   Plan:  -Continue enoxaparin 1 mg/kg (90 mg) q 12 hr  -Give warfarin 9 mg PO x1 dose -Monitor daily INR, CBC, clinical course, s/sx of bleed, PO intake, DDI  Arrie Senate, PharmD PGY-1 Pharmacy Resident Pager: [phone] 01/29/2016

## 2016-01-29 NOTE — Care Management Note (Signed)
Case Management Note  Patient Details  Name: BLAYTON HUTTNER MRN: [id number] Date of Birth: 1950-07-24  Subjective/Objective:         CM following for progression and d/c planning.            Action/Plan: 01/29/2016 Notified by Pharmacist that this pt is [facility] and will need assistance with Lovenox. This CM spoke with [facility] rep from [place] with pt update and a referral to [facility] for medication issues as this is where the pt is followed. This CM then called [facility] and spoke with Rosanne Sack RN, [facility] rep re this pt's needs. Info requested and faxed, H&P, Current progress notes, labs, xrays, and PT/INR.  Await [facility] response re possibility of assisting this pt with Lovenox at the time of d/c as he will not be able to travel to [facility] to an appointment or medications for some time.   Expected Discharge Date:                  Expected Discharge Plan:  [place]  In-House Referral:  NA  Discharge planning Services  CM Consult  Post Acute Care Choice:  Home Health Choice offered to:  Patient  DME Arranged:    DME Agency:     HH Arranged:  RN [place] Agency:     Status of Service:  In process, will continue to follow  If discussed at Long Length of Stay Meetings, dates discussed:    Additional Comments:  Adron Bene, RN 01/29/2016, 3:22 PM

## 2016-01-29 NOTE — Progress Notes (Signed)
[facility] PROGRESS NOTE  Jerry [name] MAU:[id number] DOB: Dec 27, 1950 DOA: 01/21/2016   PCP: No primary care provider on file.     Assessment/Plan: Active Problems:   Portal vein thrombosis   Diabetes mellitus (HCC)   CAD (coronary artery disease)   Diabetes mellitus with complication (HCC)   Essential hypertension   Urinary tract infectious disease   65 y.o. male with medical history significant for DM, Hyperlipidemia, Kidney/urether cancer (2001), presenting to the ED with Acute onset of nausea, vomiting and abdominal distention since 1 am.  CT showing SMV thrombus, small portal venous component, and edematous bowel  Assessment and plan   Portal Vein Thrombosis-patient continues to have nausea and abdominal pain progressing very slowly, unable to tolerate clear liquids  CT A/P: portal vein thrombosis with occlusive ileal vein clot causing congestion/ thickening of multiple ileal loops and non-occlusive thrombus, extending to the SMV and main portal vein .continue telemetry Currently on Lovenox and Coumadin,  INR 1.55 Tolerating soft diet, moving his bowels, minimal abdominal pain, distention is improving Interventional Radiology and VVS consulted ,no role for surgery for his thrombus, he recommended interventional radiology evaluation for consideration of thrombolytic lysis Interventional radiology does not feel that the patient would need TIPS/thrombolysis or thrombectomy Patient was started on TPN (01/26/16-7-24/17)however this has been discontinued as the patient is tolerating soft diet Low-grade fever of 100.8 last night, however no change in abdominal symptoms White blood cell count normal, we started empiric antibiotics, chest x-ray negative for pneumonia Anticipated the patient has no fever and clinically stable he will discharge home tomorrow   Melena Patient had EGD colonoscopy 6 months ago Hemoglobin drop from 11.7-10.6 in 2 days on the heparin drip, now  hemoglobin 10.0 Could have possible sloughing office Into stance due to the thrombosis however no concerning for active ongoing GI bleeding Continue to monitor CBC   Ileus, Slowly improving  Continue MiraLAX twice a day, continue Dulcolax suppository, BM yesterday start TNA (per pharmacy consult) Replete magnesium   CAD, EKG without ACS changes , last cardiac catheterization with stent in 2001 , patient is cardiac pain free at this time. Continue home meds when nausea resolves 2 D echo  shows EF of 56-38%, grade 1 diastolic dysfunction Hydralazine for BP 160/90    UTI suggested by +nitrites in urine. 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 No history of Hepatitis AST/ALT 41/33,   Lipase 20    Dependent edema Patient complains of swelling in both his feet Received  Lasix 1   Hyperlipidemia Continue home statins  Type II Diabetes   Hgb A1C 8.9, hold metformin Continue Lantus and SSI    DVT prophylaxsis  Coumadin/Lovenox Code Status:  Full code    Family Communication: Discussed in detail with the patient, all imaging results, lab results explained to the patient   Disposition Plan:  Anticipate discharge tomorrow    Consultants:  Interventional radiology  Vascular surgery  General surgery    Procedures:  None  Antibiotics: Anti-infectives    Start     Dose/Rate Route Frequency Ordered Stop   01/29/16 1000  cefTRIAXone (ROCEPHIN) 2 g in dextrose 5 % 50 mL IVPB     2 g 100 mL/hr  over 30 Minutes Intravenous Every 24 hours 01/29/16 0938     01/21/16 0900  cefTRIAXone (ROCEPHIN) 1 g in dextrose 5 % 50 mL IVPB  Status:  Discontinued     1 g 100 mL/hr over 30 Minutes Intravenous Every 24  hours 01/21/16 0827 01/27/16 0933         HPI/Subjective:  No further GI bleeding or melena, low-grade fever of 100.8, however patient is asymptomatic, continue to monitor   Objective: Vitals:   01/28/16 1714 01/28/16 2045 01/29/16 0439 01/29/16 0840  BP: (!) 164/72 134/69 129/66 132/61  Pulse: 87 96 72 80  Resp: 17 19 (!) 21 19  Temp: 99.2 F (37.3 C) (!) 100.8 F (38.2 C) 99.6 F (37.6 C) 98.7 F (37.1 C)  TempSrc: Oral Oral Oral Oral  SpO2: 97% 97% 95% 97%  Weight:  90.2 kg (198 lb 12.8 oz)    Height:        Intake/Output Summary (Last 24 hours) at 01/29/16 1044 Last data filed at 01/29/16 0840  Gross per 24 hour  Intake              600 ml  Output             1700 ml  Net            -1100 ml    Exam:  Examination:  General exam: Appears calm and comfortable  Respiratory system: Clear to auscultation. Respiratory effort normal. Cardiovascular system: S1 & S2 heard, RRR. No JVD, murmurs, rubs, gallops or clicks. No pedal edema. Gastrointestinal system: Distended, soft and nontender. No organomegaly or masses felt. Normal bowel sounds heard. Central nervous system: Alert and oriented. No focal neurological deficits. Extremities: Symmetric 5 x 5 power., Trace edema Skin: No rashes, lesions or ulcers Psychiatry: Judgement and insight appear normal. Mood & affect appropriate.     Data Reviewed: I have personally reviewed following labs and imaging studies  Micro Results Recent Results (from the past 240 hour(s))  Culture, blood (Routine X 2) w Reflex to ID Panel     Status: None   Collection Time: 01/21/16 11:37 AM  Result Value Ref Range Status   Specimen Description BLOOD LEFT HAND  Final   Special Requests IN PEDIATRIC BOTTLE 3CC  Final   Culture NO GROWTH 5 DAYS  Final   Report Status 01/26/2016 FINAL  Final  MRSA PCR Screening     Status: None   Collection Time: 01/21/16 11:40 AM  Result Value Ref Range Status   MRSA by PCR NEGATIVE NEGATIVE Final     Comment:        The GeneXpert MRSA Assay (FDA approved for NASAL specimens only), is one component of a comprehensive MRSA colonization surveillance program. It is not intended to diagnose MRSA infection nor to guide or monitor treatment for MRSA infections.   Culture, blood (Routine X 2) w Reflex to ID Panel     Status: None   Collection Time: 01/21/16 11:42 AM  Result Value Ref Range Status   Specimen Description RIGHT ANTECUBITAL  Final   Special Requests BOTTLES DRAWN AEROBIC ONLY 5CC  Final   Culture NO GROWTH 5 DAYS  Final   Report Status 01/26/2016 FINAL  Final  Urine culture     Status: Abnormal   Collection Time: 01/21/16  2:00 PM  Result Value Ref Range Status   Specimen Description URINE, RANDOM  Final   Special Requests NONE  Final   Culture (A)  Final  40,000 COLONIES/mL STAPHYLOCOCCUS SPECIES (COAGULASE NEGATIVE)   Report Status 01/23/2016 FINAL  Final   Organism ID, Bacteria STAPHYLOCOCCUS SPECIES (COAGULASE NEGATIVE) (A)  Final      Susceptibility   Staphylococcus species (coagulase negative) - MIC*    CIPROFLOXACIN <=0.5 SENSITIVE Sensitive     GENTAMICIN <=0.5 SENSITIVE Sensitive     NITROFURANTOIN <=16 SENSITIVE Sensitive     OXACILLIN <=0.25 SENSITIVE Sensitive     TETRACYCLINE <=1 SENSITIVE Sensitive     VANCOMYCIN 1 SENSITIVE Sensitive     TRIMETH/SULFA <=10 SENSITIVE Sensitive     CLINDAMYCIN <=0.25 SENSITIVE Sensitive     RIFAMPIN <=0.5 SENSITIVE Sensitive     Inducible Clindamycin NEGATIVE Sensitive     * 40,000 COLONIES/mL STAPHYLOCOCCUS SPECIES (COAGULASE NEGATIVE)    Radiology Reports Dg Chest 2 View  Result Date: 01/29/2016 CLINICAL DATA:  Onset of fever yesterday, history of diabetes, hyperlipidemia, urinary tract malignancy. EXAM: CHEST  2 VIEW COMPARISON:  Chest x-ray of July 27, 2010 FINDINGS: The lungs are hypoinflated. The interstitial markings are coarse though stable. There is new mildly increased density at both lung bases.  There is blunting of the costophrenic angles. The heart is top normal in size. The pulmonary vascularity is not engorged. There is calcification in the wall of the aortic arch. The PICC line tip projects over the distal third of the SVC. There is mild multilevel degenerative disc disease of the thoracic spine. IMPRESSION: Hypoinflation with bibasilar atelectasis and small bilateral pleural effusions. No alveolar pneumonia nor CHF. Aortic atherosclerosis. Electronically Signed   By: David  Martinique M.D.   On: 01/29/2016 10:25  Ct Abdomen Pelvis W Contrast  Result Date: 01/21/2016 CLINICAL DATA:  Right-sided abdominal pain EXAM: CT ABDOMEN AND PELVIS WITH CONTRAST TECHNIQUE: Multidetector CT imaging of the abdomen and pelvis was performed using the standard protocol following bolus administration of intravenous contrast. CONTRAST:  142m ISOVUE-300 IOPAMIDOL (ISOVUE-300) INJECTION 61% COMPARISON:  07/27/2010 FINDINGS: Lower chest and abdominal wall: Extensive atherosclerotic calcification in the coronary circulation. Previous ventral hernia repair with mesh. Hepatobiliary: Cirrhotic liver. No focal mass lesion is seen. Portal hypertension with small ascites, mild splenomegaly, and venous collaterals.Occlusive portal thrombus seen in ileal branches with nonocclusive thrombus extending into the SMV and main portal vein. This has the appearance of bland thrombus. Pancreas: Unremarkable. Spleen: Unremarkable. Adrenals/Urinary Tract: Negative adrenals. Partial nephrectomy to the upper pole left kidney where a presumed renal cell carcinoma was seen previously. Bilateral renal cysts. No hydronephrosis or stone. Unremarkable bladder. Stomach/Bowel: Submucosal edema causes thickening of multiple ileal loops with mesenteric fat congestion. No bowel obstruction. Reproductive:Symmetric prostate enlargement. Vascular/Lymphatic: Portal vein thrombus as noted above. Aortic and iliac atherosclerosis. Mild enlargement of deep liver  drainage lymph nodes, presumably reactive to the chronic liver disease. Other: Negative Musculoskeletal: No acute abnormalities. Diffuse disc and facet degeneration. IMPRESSION: 1. 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 Nonocclusive thrombus extends into the SMV and main portal veins. 2. Cirrhosis. 3. Small ascites. 4. Unremarkable appearance of left partial nephrectomy site. Electronically Signed   By: JMonte FantasiaM.D.   On: 01/21/2016 05:23   Dg Abd Portable 1v  Result Date: 01/23/2016 CLINICAL DATA:  NG tube placement EXAM: PORTABLE ABDOMEN - 1 VIEW COMPARISON:  CT scan from 2 days ago. FINDINGS: NG tube tip is transpyloric, in the distal descending duodenum. NG tube could be pulled back 9-10 cm to place the tip in the distal stomach. There is mild gaseous distention  of small bowel and colon. IMPRESSION: NG tube tip is transpyloric. Electronically Signed   By: Misty Stanley M.D.   On: 01/23/2016 17:21     CBC  Recent Labs Lab 01/23/16 0347  01/25/16 0520 01/26/16 0520 01/27/16 0445 01/28/16 0610 01/29/16 0425  WBC 17.3*  < > 7.8 5.7 5.7 7.2 7.4  HGB 13.8  < > 12.1* 11.7* 11.4* 10.6* 10.0*  HCT 42.0  < > 36.5* 36.2* 34.2* 31.9* 30.5*  PLT 193  < > 151 142* 142* 143* 143*  MCV 93.3  < > 91.3 92.3 91.0 91.4 91.0  MCH 30.7  < > 30.3 29.8 30.3 30.4 29.9  MCHC 32.9  < > 33.2 32.3 33.3 33.2 32.8  RDW 14.1  < > 14.0 13.9 13.8 14.0 14.1  LYMPHSABS 2.4  --   --   --  1.5 1.6  --   MONOABS 1.1*  --   --   --  0.5 0.8  --   EOSABS 0.1  --   --   --  0.2 0.2  --   BASOSABS 0.0  --   --   --  0.0 0.0  --   < > = values in this interval not displayed.  Chemistries   Recent Labs Lab 01/23/16 0347 01/24/16 0248 01/25/16 0520 01/26/16 0520 01/26/16 2500 01/27/16 0445 01/27/16 0725 01/28/16 0610 01/29/16 0425  NA 139 140 140 140  --  137  --  136 136  K 3.7 3.8 3.7 3.8  --  3.8  --  3.8 4.2  CL 107 113* 112* 111   --  109  --  105 105  CO2 25 24 25 26   --  24  --  26 28  GLUCOSE 159* 133* 131* 122*  --  155*  --  227* 143*  BUN 15 13 15 10   --  8  --  12 10  CREATININE 0.89 0.76 0.74 0.70  --  0.59*  --  0.63 0.68  CALCIUM 8.1* 8.1* 8.2* 8.3*  --  8.3*  --  8.4* 8.4*  MG  --   --   --   --  1.6*  --  1.7 1.6*  --   AST 30 28 28   --   --  41  --  33  --   ALT 39 33 30  --   --  33  --  31  --   ALKPHOS 86 75 79  --   --  106  --  109  --   BILITOT 1.0 0.8 0.7  --   --  0.5  --  0.5  --    ------------------------------------------------------------------------------------------------------------------ estimated creatinine clearance is 96.7 mL/min (by C-G formula based on SCr of 0.8 mg/dL). ------------------------------------------------------------------------------------------------------------------ No results for input(s): HGBA1C in the last 72 hours. ------------------------------------------------------------------------------------------------------------------  Recent Labs  01/28/16 0610  TRIG 79   ------------------------------------------------------------------------------------------------------------------ No results for input(s): TSH, T4TOTAL, T3FREE, THYROIDAB in the last 72 hours.  Invalid input(s): FREET3 ------------------------------------------------------------------------------------------------------------------ No results for input(s): VITAMINB12, FOLATE, FERRITIN, TIBC, IRON, RETICCTPCT in the last 72 hours.  Coagulation profile  Recent Labs Lab 01/25/16 0854 01/26/16 0520 01/27/16 0445 01/28/16 0610 01/29/16 0425  INR 1.39 1.49 1.83* 1.68* 1.55*    No results for input(s): DDIMER in the last 72 hours.  Cardiac Enzymes No results for input(s): CKMB, TROPONINI, MYOGLOBIN in the last 168 hours.  Invalid input(s): CK ------------------------------------------------------------------------------------------------------------------ Invalid input(s):  POCBNP   CBG:  Recent Labs  Lab 01/28/16 0744 01/28/16 1150 01/28/16 1645 01/28/16 2047 01/29/16 0743  GLUCAP 181* 232* 271* 209* 141*       Studies: Dg Chest 2 View  Result Date: 01/29/2016 CLINICAL DATA:  Onset of fever yesterday, history of diabetes, hyperlipidemia, urinary tract malignancy. EXAM: CHEST  2 VIEW COMPARISON:  Chest x-ray of July 27, 2010 FINDINGS: The lungs are hypoinflated. The interstitial markings are coarse though stable. There is new mildly increased density at both lung bases. There is blunting of the costophrenic angles. The heart is top normal in size. The pulmonary vascularity is not engorged. There is calcification in the wall of the aortic arch. The PICC line tip projects over the distal third of the SVC. There is mild multilevel degenerative disc disease of the thoracic spine. IMPRESSION: Hypoinflation with bibasilar atelectasis and small bilateral pleural effusions. No alveolar pneumonia nor CHF. Aortic atherosclerosis. Electronically Signed   By: David  Martinique M.D.   On: 01/29/2016 10:25     Lab Results  Component Value Date   HGBA1C 8.9 (H) 01/21/2016   Lab Results  Component Value Date   CREATININE 0.68 01/29/2016       Scheduled Meds: . antiseptic oral rinse  7 mL Mouth Rinse q12n4p  . cefTRIAXone (ROCEPHIN)  IV  2 g Intravenous Q24H  . chlorhexidine  15 mL Mouth Rinse BID  . enoxaparin (LOVENOX) injection  1 mg/kg Subcutaneous Q12H  . feeding supplement (GLUCERNA SHAKE)  237 mL Oral BID BM  . insulin aspart  0-9 Units Subcutaneous TID WC  . insulin glargine  10 Units Subcutaneous Daily  . isosorbide dinitrate  30 mg Oral TID  . lisinopril  5 mg Oral Daily  . multivitamin with minerals  1 tablet Oral Daily  . polyethylene glycol  17 g Oral BID  . sodium chloride flush  10-40 mL Intracatheter Q12H  . sodium chloride flush  3 mL Intravenous Q12H  . warfarin  9 mg Oral ONCE-1800  . Warfarin - Pharmacist Dosing Inpatient   Does not  apply q1800   Continuous Infusions:     LOS: 8 days    Time spent: >30 MINS    [facility]  [facility] Pager [phone]. If 7PM-7AM, please contact night-coverage at [email or web address], password [facility] 01/29/2016, 10:44 AM  LOS: 8 days

## 2016-01-29 NOTE — Discharge Instructions (Signed)

## 2016-01-29 NOTE — Progress Notes (Signed)
[facility] Progress Note     Subjective: No complaints this morning. Reports decreased "pressure" in lower abdomen after taking Maalox. Tolerating diet. +BM today, unable to tell me if stool was brown or black. Urinating. Ambulating. Denies SOB, chills, night sweats.  Objective: Vital signs in last 24 hours: Temp:  [98.7 F (37.1 C)-100.8 F (38.2 C)] 98.7 F (37.1 C) (07/25 0840) Pulse Rate:  [72-96] 80 (07/25 0840) Resp:  [17-21] 19 (07/25 0840) BP: (129-164)/(61-72) 132/61 (07/25 0840) SpO2:  [95 %-97 %] 97 % (07/25 0840) Weight:  [90.2 kg (198 lb 12.8 oz)] 90.2 kg (198 lb 12.8 oz) (07/24 2045) Last BM Date: 01/28/16  Intake/Output from previous day: 07/24 0701 - 07/25 0700 In: 600 [P.O.:600] Out: 1400 [Urine:1400] Intake/Output this shift: Total I/O In: 240 [P.O.:240] Out: 300 [Urine:300]  PE: Gen:  Alert, NAD, pleasant Card:  RRR, no M/G/R heard Pulm:  CTA, no W/R/R  Abd: Soft, NT, distended but improved, +BS, no HSM Ext:  No erythema or tenderness, BL pitting edema below the knee  Lab Results:   Recent Labs  01/28/16 0610 01/29/16 0425  WBC 7.2 7.4  HGB 10.6* 10.0*  HCT 31.9* 30.5*  PLT 143* 143*   BMET  Recent Labs  01/28/16 0610 01/29/16 0425  NA 136 136  K 3.8 4.2  CL 105 105  CO2 26 28  GLUCOSE 227* 143*  BUN 12 10  CREATININE 0.63 0.68  CALCIUM 8.4* 8.4*   PT/INR  Recent Labs  01/28/16 0610 01/29/16 0425  LABPROT 19.8* 18.7*  INR 1.68* 1.55*   CMP     Component Value Date/Time   NA 136 01/29/2016 0425   K 4.2 01/29/2016 0425   CL 105 01/29/2016 0425   CO2 28 01/29/2016 0425   GLUCOSE 143 (H) 01/29/2016 0425   BUN 10 01/29/2016 0425   CREATININE 0.68 01/29/2016 0425   CALCIUM 8.4 (L) 01/29/2016 0425   PROT 5.3 (L) 01/28/2016 0610   ALBUMIN 2.3 (L) 01/28/2016 0610   AST 33 01/28/2016 0610   ALT 31 01/28/2016 0610   ALKPHOS 109 01/28/2016 0610   BILITOT 0.5 01/28/2016 0610   GFRNONAA >60 01/29/2016 0425   GFRAA >60 01/29/2016 0425   Lipase     Component Value Date/Time   LIPASE 20 01/22/2016 0330   Studies/Results: No results found.  Anti-infectives: Anti-infectives    Start     Dose/Rate Route Frequency Ordered Stop   01/29/16 1000  cefTRIAXone (ROCEPHIN) 2 g in dextrose 5 % 50 mL IVPB     2 g 100 mL/hr over 30 Minutes Intravenous Every 24 hours 01/29/16 0938     01/21/16 0900  cefTRIAXone (ROCEPHIN) 1 g in dextrose 5 % 50 mL IVPB  Status:  Discontinued     1 g 100 mL/hr over 30 Minutes Intravenous Every 24 hours 01/21/16 0827 01/27/16 0933     Assessment/Plan Portal Vein Thrombosis - SMV thrombus, edematous bowel on CT - on heparin and coumadin  - NGT 7/19. D/c-ed 7/20  - continue dulcolax suppository/miralax PRN, add Maalox   Melena: - low suspicion for upper GI bleed/ulcer - colonoscopy and EGD 6 months ago were normal - Hgb/Hct stable  CAD UTI HL DM2  FEN: soft diet and advance as tolerated, TPN discontinued (01/26/16-7-24/17) ID: Rocephin 7/7 days completed Plan: continue soft diet and abdominal exams, abdominal exam is continuing to improve Discharge per medicine   LOS: 8 days    Jill Alexanders , [facility]  [facility] 01/29/2016, 10:09 AM Pager: [phone] Consults: [phone] Mon-Fri 7:00 am-4:30 pm Sat-Sun 7:00 am-11:30 am

## 2016-01-30 LAB — COMPREHENSIVE METABOLIC PANEL
ALBUMIN: 2.3 g/dL — AB (ref 3.5–5.0)
ALT: 43 U/L (ref 17–63)
AST: 54 U/L — AB (ref 15–41)
Alkaline Phosphatase: 154 U/L — ABNORMAL HIGH (ref 38–126)
Anion gap: 3 — ABNORMAL LOW (ref 5–15)
BUN: 9 mg/dL (ref 6–20)
CHLORIDE: 104 mmol/L (ref 101–111)
CO2: 28 mmol/L (ref 22–32)
Calcium: 8.5 mg/dL — ABNORMAL LOW (ref 8.9–10.3)
Creatinine, Ser: 0.71 mg/dL (ref 0.61–1.24)
GFR calc Af Amer: >60 mL/min (ref >60)
GFR calc non Af Amer: >60 mL/min (ref >60)
GLUCOSE: 129 mg/dL — AB (ref 65–99)
POTASSIUM: 4 mmol/L (ref 3.5–5.1)
SODIUM: 135 mmol/L (ref 135–145)
Total Bilirubin: 1.1 mg/dL (ref 0.3–1.2)
Total Protein: 5.5 g/dL — ABNORMAL LOW (ref 6.5–8.1)

## 2016-01-30 LAB — CBC
HCT: 30.1 % — ABNORMAL LOW (ref 39.0–52.0)
HEMOGLOBIN: 10 g/dL — AB (ref 13.0–17.0)
MCH: 30.5 pg (ref 26.0–34.0)
MCHC: 33.2 g/dL (ref 30.0–36.0)
MCV: 91.8 fL (ref 78.0–100.0)
Platelets: 158 10*3/uL (ref 150–400)
RBC: 3.28 MIL/uL — AB (ref 4.22–5.81)
RDW: 14.2 % (ref 11.5–15.5)
WBC: 6.8 10*3/uL (ref 4.0–10.5)

## 2016-01-30 LAB — PROTIME-INR
INR: 1.66
PROTHROMBIN TIME: 19.6 s — AB (ref 11.4–15.2)

## 2016-01-30 LAB — GLUCOSE, CAPILLARY
GLUCOSE-CAPILLARY: 151 mg/dL — AB (ref 65–99)
Glucose-Capillary: 135 mg/dL — ABNORMAL HIGH (ref 65–99)

## 2016-01-30 MED ORDER — WARFARIN SODIUM 3 MG PO TABS
9.0000 mg | ORAL_TABLET | Freq: Once | ORAL | Status: AC
  Administered 2016-01-30: 9 mg via ORAL
  Filled 2016-01-30: qty 1

## 2016-01-30 MED ORDER — TRAMADOL HCL 50 MG PO TABS: 50.0000 mg | ORAL_TABLET | Freq: Two times a day (BID) | ORAL | 0 refills | Status: DC | PRN

## 2016-01-30 MED ORDER — ENOXAPARIN SODIUM 150 MG/ML ~~LOC~~ SOLN: 1.0000 mg/kg | Freq: Two times a day (BID) | SUBCUTANEOUS | 0 refills | Status: DC

## 2016-01-30 MED ORDER — GLUCERNA SHAKE PO LIQD: 237.0000 mL | Freq: Two times a day (BID) | ORAL | 0 refills | Status: DC

## 2016-01-30 MED ORDER — GLIPIZIDE 5 MG PO TABS: 5.0000 mg | ORAL_TABLET | Freq: Two times a day (BID) | ORAL | 1 refills | Status: DC

## 2016-01-30 MED ORDER — SENNOSIDES-DOCUSATE SODIUM 8.6-50 MG PO TABS: 1.0000 | ORAL_TABLET | Freq: Every evening | ORAL | 1 refills | Status: DC | PRN

## 2016-01-30 MED ORDER — POLYETHYLENE GLYCOL 3350 17 G PO PACK: 17.0000 g | PACK | Freq: Two times a day (BID) | ORAL | 0 refills | Status: DC

## 2016-01-30 MED ORDER — MAGNESIUM CITRATE PO SOLN: 1.0000 | Freq: Once | ORAL | 1 refills | Status: DC | PRN

## 2016-01-30 MED ORDER — WARFARIN SODIUM 10 MG PO TABS: 10.0000 mg | ORAL_TABLET | Freq: Once | ORAL | 0 refills | Status: DC

## 2016-01-30 NOTE — Discharge Summary (Signed)
Physician Discharge Summary  Jerry [name] MRN: [id number] DOB/AGE: [birth year] 65 y.o.  PCP: No primary care provider on file.   Admit date: 01/21/2016 Discharge date: 01/30/2016  Discharge Diagnoses:    Active Problems:   Portal vein thrombosis   Diabetes mellitus (HCC)   CAD (coronary artery disease)   Diabetes mellitus with complication (HCC)   Essential hypertension   Urinary tract infectious disease   Fever    Follow-up recommendations Follow-up with PCP in 3-5 days , including all  additional recommended appointments as below Follow-up CBC, CMP in 3-5 days Patient would need follow-up with a gastroenterologist for his cirrhosis and portal vein thrombosis Patient would need to have daily INR, discontinue Lovenox when INR greater than 2.0     Current Discharge Medication List    START taking these medications   Details  enoxaparin (LOVENOX) 150 MG/ML injection Inject 0.59 mLs (90 mg total) into the skin every 12 (twelve) hours. Qty: 10 Syringe, Refills: 0    feeding supplement, GLUCERNA SHAKE, (GLUCERNA SHAKE) LIQD Take 237 mLs by mouth 2 (two) times daily between meals. Qty: 15 Can, Refills: 0    magnesium citrate SOLN Take 296 mLs (1 Bottle total) by mouth once as needed for severe constipation. Qty: 195 mL, Refills: 1    polyethylene glycol (MIRALAX / GLYCOLAX) packet Take 17 g by mouth 2 (two) times daily. Qty: 14 each, Refills: 0    senna-docusate (SENOKOT-S) 8.6-50 MG tablet Take 1 tablet by mouth at bedtime as needed for mild constipation. Qty: 60 tablet, Refills: 1    warfarin (COUMADIN) 10 MG tablet Take 1 tablet (10 mg total) by mouth one time only at 6 PM. Qty: 30 tablet, Refills: 0      CONTINUE these medications which have CHANGED   Details  glipiZIDE (GLUCOTROL) 5 MG tablet Take 1 tablet (5 mg total) by mouth 2 (two) times daily before a meal. Qty: 60 tablet, Refills: 1    traMADol (ULTRAM) 50 MG tablet Take 1 tablet (50 mg total) by mouth  2 (two) times daily as needed for moderate pain. Qty: 30 tablet, Refills: 0      CONTINUE these medications which have NOT CHANGED   Details  acetaminophen (TYLENOL) 325 MG tablet Take 975 mg by mouth 3 (three) times daily as needed for mild pain or fever.    alprostadil (MUSE) 1000 MCG pellet 1,000 mcg by Transurethral route See admin instructions. Every 96 hours as needed for erection    aspirin 81 MG chewable tablet Chew 81 mg by mouth daily.    carvedilol (COREG) 6.25 MG tablet Take 6.25 mg by mouth 2 (two) times daily with a meal.    finasteride (PROSCAR) 5 MG tablet Take 5 mg by mouth daily.    isosorbide dinitrate (ISORDIL) 30 MG tablet Take 30 mg by mouth 3 (three) times daily.    lisinopril (PRINIVIL,ZESTRIL) 5 MG tablet Take 2.5 mg by mouth daily.    magnesium oxide (MAG-OX) 400 MG tablet Take 400 mg by mouth daily.    nitroGLYCERIN (NITROSTAT) 0.4 MG SL tablet Place 0.4 mg under the tongue every 5 (five) minutes as needed for chest pain.    omeprazole (PRILOSEC) 20 MG capsule Take 20 mg by mouth every evening.    rosuvastatin (CRESTOR) 10 MG tablet Take 5 mg by mouth at bedtime.    simethicone (MYLICON) 80 MG chewable tablet Chew 160 mg by mouth 2 (two) times daily as needed for flatulence.  STOP taking these medications     metFORMIN (GLUCOPHAGE) 1000 MG tablet          Discharge Condition: Stable    Discharge Instructions Get Medicines reviewed and adjusted: Please take all your medications with you for your next visit with your Primary MD  Please request your Primary MD to go over all hospital tests and procedure/radiological results at the follow up, please ask your Primary MD to get all Hospital records sent to his/her office.  If you experience worsening of your admission symptoms, develop shortness of breath, life threatening emergency, suicidal or homicidal thoughts you must seek medical attention immediately by calling 911 or calling your MD  immediately if symptoms less severe.  You must read complete instructions/literature along with all the possible adverse reactions/side effects for all the Medicines you take and that have been prescribed to you. Take any new Medicines after you have completely understood and accpet all the possible adverse reactions/side effects.   Do not drive when taking Pain medications.   Do not take more than prescribed Pain, Sleep and Anxiety Medications  Special Instructions: If you have smoked or chewed Tobacco in the last 2 yrs please stop smoking, stop any regular Alcohol and or any Recreational drug use.  Wear Seat belts while driving.  Please note  You were cared for by a hospitalist during your hospital stay. Once you are discharged, your primary care physician will handle any further medical issues. Please note that NO REFILLS for any discharge medications will be authorized once you are discharged, as it is imperative that you return to your primary care physician (or establish a relationship with a primary care physician if you do not have one) for your aftercare needs so that they can reassess your need for medications and monitor your lab values.  Discharge Instructions    Diet - low sodium heart healthy    Complete by:  As directed   Increase activity slowly    Complete by:  As directed       Allergies  Allergen Reactions  . Penicillins Swelling    Swelling at injection site Has patient had a PCN reaction causing immediate rash, facial/tongue/throat swelling, SOB or lightheadedness with hypotension: No Has patient had a PCN reaction causing severe rash involving mucus membranes or skin necrosis: No Has patient had a PCN reaction that required hospitalization No Has patient had a PCN reaction occurring within the last 10 years: No If all of the above answers are "NO", then may proceed with Cephalosporin use.      Disposition:  home with follow-up at the [facility]: Gen. surgery Interventional radiology *    Significant Diagnostic Studies:  Dg Chest 2 View  Result Date: 01/29/2016 CLINICAL DATA:  Onset of fever yesterday, history of diabetes, hyperlipidemia, urinary tract malignancy. EXAM: CHEST  2 VIEW COMPARISON:  Chest x-ray of July 27, 2010 FINDINGS: The lungs are hypoinflated. The interstitial markings are coarse though stable. There is new mildly increased density at both lung bases. There is blunting of the costophrenic angles. The heart is top normal in size. The pulmonary vascularity is not engorged. There is calcification in the wall of the aortic arch. The PICC line tip projects over the distal third of the SVC. There is mild multilevel degenerative disc disease of the thoracic spine. IMPRESSION: Hypoinflation with bibasilar atelectasis and small bilateral pleural effusions. No alveolar pneumonia nor CHF. Aortic atherosclerosis. Electronically Signed   By: Shanon Brow  [place] M.D.   On: 01/29/2016 10:25  Ct Abdomen Pelvis W Contrast  Result Date: 01/21/2016 CLINICAL DATA:  Right-sided abdominal pain EXAM: CT ABDOMEN AND PELVIS WITH CONTRAST TECHNIQUE: Multidetector CT imaging of the abdomen and pelvis was performed using the standard protocol following bolus administration of intravenous contrast. CONTRAST:  167m ISOVUE-300 IOPAMIDOL (ISOVUE-300) INJECTION 61% COMPARISON:  07/27/2010 FINDINGS: Lower chest and abdominal wall: Extensive atherosclerotic calcification in the coronary circulation. Previous ventral hernia repair with mesh. Hepatobiliary: Cirrhotic liver. No focal mass lesion is seen. Portal hypertension with small ascites, mild splenomegaly, and venous collaterals.Occlusive portal thrombus seen in ileal branches with nonocclusive thrombus extending into the SMV and main portal vein. This has the appearance of bland thrombus. Pancreas: Unremarkable. Spleen: Unremarkable. Adrenals/Urinary Tract: Negative adrenals. Partial  nephrectomy to the upper pole left kidney where a presumed renal cell carcinoma was seen previously. Bilateral renal cysts. No hydronephrosis or stone. Unremarkable bladder. Stomach/Bowel: Submucosal edema causes thickening of multiple ileal loops with mesenteric fat congestion. No bowel obstruction. Reproductive:Symmetric prostate enlargement. Vascular/Lymphatic: Portal vein thrombus as noted above. Aortic and iliac atherosclerosis. Mild enlargement of deep liver drainage lymph nodes, presumably reactive to the chronic liver disease. Other: Negative Musculoskeletal: No acute abnormalities. Diffuse disc and facet degeneration. IMPRESSION: 1. 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 Nonocclusive thrombus extends into the SMV and main portal veins. 2. Cirrhosis. 3. Small ascites. 4. Unremarkable appearance of left partial nephrectomy site. Electronically Signed   By: JMonte FantasiaM.D.   On: 01/21/2016 05:23   Dg Abd Portable 1v  Result Date: 01/23/2016 CLINICAL DATA:  NG tube placement EXAM: PORTABLE ABDOMEN - 1 VIEW COMPARISON:  CT scan from 2 days ago. FINDINGS: NG tube tip is transpyloric, in the distal descending duodenum. NG tube could be pulled back 9-10 cm to place the tip in the distal stomach. There is mild gaseous distention of small bowel and colon. IMPRESSION: NG tube tip is transpyloric. Electronically Signed   By: EMisty StanleyM.D.   On: 01/23/2016 17:21        Filed Weights   01/27/16 2125 01/28/16 2045 01/29/16 2124  Weight: 88.6 kg (195 lb 6.4 oz) 90.2 kg (198 lb 12.8 oz) 96.2 kg (212 lb)     Microbiology: Recent Results (from the past 240 hour(s))  Culture, blood (Routine X 2) w Reflex to ID Panel     Status: None   Collection Time: 01/21/16 11:37 AM  Result Value Ref Range Status   Specimen Description BLOOD LEFT HAND  Final   Special Requests IN PEDIATRIC BOTTLE 3CC  Final   Culture NO GROWTH 5 DAYS   Final   Report Status 01/26/2016 FINAL  Final  MRSA PCR Screening     Status: None   Collection Time: 01/21/16 11:40 AM  Result Value Ref Range Status   MRSA by PCR NEGATIVE NEGATIVE Final    Comment:        The GeneXpert MRSA Assay (FDA approved for NASAL specimens only), is one component of a comprehensive MRSA colonization surveillance program. It is not intended to diagnose MRSA infection nor to guide or monitor treatment for MRSA infections.   Culture, blood (Routine X 2) w Reflex to ID Panel     Status: None   Collection Time: 01/21/16 11:42 AM  Result Value Ref Range Status   Specimen Description RIGHT ANTECUBITAL  Final   Special Requests BOTTLES DRAWN AEROBIC ONLY 5CC  Final  Culture NO GROWTH 5 DAYS  Final   Report Status 01/26/2016 FINAL  Final  Urine culture     Status: Abnormal   Collection Time: 01/21/16  2:00 PM  Result Value Ref Range Status   Specimen Description URINE, RANDOM  Final   Special Requests NONE  Final   Culture (A)  Final    40,000 COLONIES/mL STAPHYLOCOCCUS SPECIES (COAGULASE NEGATIVE)   Report Status 01/23/2016 FINAL  Final   Organism ID, Bacteria STAPHYLOCOCCUS SPECIES (COAGULASE NEGATIVE) (A)  Final      Susceptibility   Staphylococcus species (coagulase negative) - MIC*    CIPROFLOXACIN <=0.5 SENSITIVE Sensitive     GENTAMICIN <=0.5 SENSITIVE Sensitive     NITROFURANTOIN <=16 SENSITIVE Sensitive     OXACILLIN <=0.25 SENSITIVE Sensitive     TETRACYCLINE <=1 SENSITIVE Sensitive     VANCOMYCIN 1 SENSITIVE Sensitive     TRIMETH/SULFA <=10 SENSITIVE Sensitive     CLINDAMYCIN <=0.25 SENSITIVE Sensitive     RIFAMPIN <=0.5 SENSITIVE Sensitive     Inducible Clindamycin NEGATIVE Sensitive     * 40,000 COLONIES/mL STAPHYLOCOCCUS SPECIES (COAGULASE NEGATIVE)       Blood Culture    Component Value Date/Time   SDES URINE, RANDOM 01/21/2016 1400   SPECREQUEST NONE 01/21/2016 1400   CULT (A) 01/21/2016 1400    40,000 COLONIES/mL  STAPHYLOCOCCUS SPECIES (COAGULASE NEGATIVE)   REPTSTATUS 01/23/2016 FINAL 01/21/2016 1400      Labs: Results for orders placed or performed during the hospital encounter of 01/21/16 (from the past 48 hour(s))  Glucose, capillary     Status: Abnormal   Collection Time: 01/28/16 11:50 AM  Result Value Ref Range   Glucose-Capillary 232 (H) 65 - 99 mg/dL  Glucose, capillary     Status: Abnormal   Collection Time: 01/28/16  4:45 PM  Result Value Ref Range   Glucose-Capillary 271 (H) 65 - 99 mg/dL  Glucose, capillary     Status: Abnormal   Collection Time: 01/28/16  8:47 PM  Result Value Ref Range   Glucose-Capillary 209 (H) 65 - 99 mg/dL  CBC     Status: Abnormal   Collection Time: 01/29/16  4:25 AM  Result Value Ref Range   WBC 7.4 4.0 - 10.5 K/uL   RBC 3.35 (L) 4.22 - 5.81 MIL/uL   Hemoglobin 10.0 (L) 13.0 - 17.0 g/dL   HCT 30.5 (L) 39.0 - 52.0 %   MCV 91.0 78.0 - 100.0 fL   MCH 29.9 26.0 - 34.0 pg   MCHC 32.8 30.0 - 36.0 g/dL   RDW 14.1 11.5 - 15.5 %   Platelets 143 (L) 150 - 400 K/uL  Protime-INR     Status: Abnormal   Collection Time: 01/29/16  4:25 AM  Result Value Ref Range   Prothrombin Time 18.7 (H) 11.6 - 15.2 seconds   INR 1.55 (H) 0.00 - 4.76  Basic metabolic panel     Status: Abnormal   Collection Time: 01/29/16  4:25 AM  Result Value Ref Range   Sodium 136 135 - 145 mmol/L   Potassium 4.2 3.5 - 5.1 mmol/L   Chloride 105 101 - 111 mmol/L   CO2 28 22 - 32 mmol/L   Glucose, Bld 143 (H) 65 - 99 mg/dL   BUN 10 6 - 20 mg/dL   Creatinine, Ser 0.68 0.61 - 1.24 mg/dL   Calcium 8.4 (L) 8.9 - 10.3 mg/dL   GFR calc non Af Amer >60 >60 mL/min   GFR calc Af Amer >  60 >60 mL/min    Comment: (NOTE) The eGFR has been calculated using the CKD EPI equation. This calculation has not been validated in all clinical situations. eGFR's persistently <60 mL/min signify possible Chronic Kidney Disease.    Anion gap 3 (L) 5 - 15  Glucose, capillary     Status: Abnormal    Collection Time: 01/29/16  7:43 AM  Result Value Ref Range   Glucose-Capillary 141 (H) 65 - 99 mg/dL  Glucose, capillary     Status: Abnormal   Collection Time: 01/29/16 11:54 AM  Result Value Ref Range   Glucose-Capillary 195 (H) 65 - 99 mg/dL  Glucose, capillary     Status: Abnormal   Collection Time: 01/29/16  4:59 PM  Result Value Ref Range   Glucose-Capillary 221 (H) 65 - 99 mg/dL  Glucose, capillary     Status: Abnormal   Collection Time: 01/29/16  9:26 PM  Result Value Ref Range   Glucose-Capillary 200 (H) 65 - 99 mg/dL  CBC     Status: Abnormal   Collection Time: 01/30/16  5:05 AM  Result Value Ref Range   WBC 6.8 4.0 - 10.5 K/uL   RBC 3.28 (L) 4.22 - 5.81 MIL/uL   Hemoglobin 10.0 (L) 13.0 - 17.0 g/dL   HCT 30.1 (L) 39.0 - 52.0 %   MCV 91.8 78.0 - 100.0 fL   MCH 30.5 26.0 - 34.0 pg   MCHC 33.2 30.0 - 36.0 g/dL   RDW 14.2 11.5 - 15.5 %   Platelets 158 150 - 400 K/uL  Protime-INR     Status: Abnormal   Collection Time: 01/30/16  5:05 AM  Result Value Ref Range   Prothrombin Time 19.6 (H) 11.4 - 15.2 seconds   INR 1.66   Comprehensive metabolic panel     Status: Abnormal   Collection Time: 01/30/16  5:05 AM  Result Value Ref Range   Sodium 135 135 - 145 mmol/L   Potassium 4.0 3.5 - 5.1 mmol/L   Chloride 104 101 - 111 mmol/L   CO2 28 22 - 32 mmol/L   Glucose, Bld 129 (H) 65 - 99 mg/dL   BUN 9 6 - 20 mg/dL   Creatinine, Ser 0.71 0.61 - 1.24 mg/dL   Calcium 8.5 (L) 8.9 - 10.3 mg/dL   Total Protein 5.5 (L) 6.5 - 8.1 g/dL   Albumin 2.3 (L) 3.5 - 5.0 g/dL   AST 54 (H) 15 - 41 U/L   ALT 43 17 - 63 U/L   Alkaline Phosphatase 154 (H) 38 - 126 U/L   Total Bilirubin 1.1 0.3 - 1.2 mg/dL   GFR calc non Af Amer >60 >60 mL/min   GFR calc Af Amer >60 >60 mL/min    Comment: (NOTE) The eGFR has been calculated using the CKD EPI equation. This calculation has not been validated in all clinical situations. eGFR's persistently <60 mL/min signify possible Chronic  Kidney Disease.    Anion gap 3 (L) 5 - 15  Glucose, capillary     Status: Abnormal   Collection Time: 01/30/16  7:50 AM  Result Value Ref Range   Glucose-Capillary 135 (H) 65 - 99 mg/dL     Lipid Panel     Component Value Date/Time   TRIG 79 01/28/2016 0610     Lab Results  Component Value Date   HGBA1C 8.9 (H) 01/21/2016     Lab Results  Component Value Date   CREATININE 0.71 01/30/2016     HPI  64 year-old male with PMH DM2, Cirrhosis, HL, CAD s/p stent placement (2001), and cancer of the Kidney/ureter (2001) who presented to [facility] with acute nausea, vomiting, and abdominal distention on 01/21/16. Pain was described as severe, constant, 10/10 pain that started at 1:00 AM. He denies similar pain in the past. Denies use of EtOH/tobacco/drugs or changes in diet. Takes daily ASA, no other blood thinning medications. 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 Lactate level was 2.0 on admission, WBC 9.7. The patient was admitted by internal medicine to the SDU, placed on heparin, ceftriaxone, and vascular and interventional radiology asked to consult. Vascular surgery determined no surgical intervention for the patients clot was necessary. Interventional radiology suggested IV heparin therapy and serial lactate levels/abd exams with possible catheter directed thrombolysis/TIPS procedure should patient condition worsen. 01/22/16 patients lactate was 2.1 and WBC up to 22.9. IR recommended general surgery consultation should the patient decompensate secondary to bowel necrosis   HOSPITAL COURSE:   Portal Vein Thrombosis-patient continues to have nausea and abdominal pain progressing very slowly, unable to tolerate clear liquids  CT A/P: portal vein thrombosis with occlusive ileal vein clot causing congestion/ thickening of multiple ileal loops and non-occlusive thrombus, extending  to the SMV and main portal vein .continue telemetry Currently on Lovenox and Coumadin,  INR 1.66 on the day of discharge  Tolerating soft diet, moving his bowels, minimal abdominal pain, distention is improving Interventional Radiology and VVS consulted ,no role for surgery for his thrombus,   interventional radiology evaluation for consideration of thrombolytic lysis ,  They did not  feel that the patient would need TIPS/thrombolysis or thrombectomy Patient was started on TPN (01/26/16-7-24/17)however this has been discontinued as the patient is tolerating soft diet White blood cell count normal, we started empiric antibiotics  due to elevated white blood cell count on admission,   chest x-ray negative for pneumonia Anticipated the patient has no fever and clinically stable he will discharge home tomorrow Fever has resolved, abdominal pain has improved, Patient would need lifelong anticoagulation, would need a GI consult and follow-up with GI for his portal vein thrombosis, he has a gastroenterologist at [place] Patient had EGD colonoscopy 6 months ago Hemoglobin drop from 11.7-10.6 in 2 days on the heparin drip, now hemoglobin 10.0 Could have possible sloughing office Into stance due to the thrombosis however no concerning for active ongoing GI bleeding Continue to monitor CBC   Ileus, Slowly improving  Continue MiraLAX twice a day, continue Dulcolax suppository, BM yesterday Patient was started on TPN (01/26/16-7-24/17)however this has been discontinued as the patient is tolerating soft diet     CAD, EKG without ACS changes , last cardiac catheterization with stent in 2001 , patient is cardiac pain free at this time. Continue home meds when nausea resolves 2 D echo  shows EF of 37-04%, grade 1 diastolic dysfunction Hydralazine for BP 160/90    UTI suggested by +nitrites in urine. 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 No history of Hepatitis AST/ALT 41/33,   Lipase 20    Dependent edema Patient complains of swelling in both his feet Received  Lasix 1   Hyperlipidemia Continue home statins  Type II Diabetes   Hgb A1C 8.9, hold metformin Resume glipizide    Discharge Exam:  Blood pressure 123/62, pulse 75, temperature 98.8 F (37.1 C), temperature source Oral, resp. rate 19, height 5' 11"  (1.803 m), weight 96.2 kg (212 lb), SpO2 95 %.  General exam: Appears calm and comfortable  Respiratory system: Clear to auscultation. Respiratory effort normal. Cardiovascular system: S1 & S2 heard, RRR. No JVD, murmurs, rubs, gallops or clicks. No pedal edema. Gastrointestinal system: Distended, soft and nontender. No organomegaly or masses felt. Normal bowel sounds heard. Central nervous system: Alert and oriented. No focal neurological deficits. Extremities: Symmetric 5 x 5 power., Trace edema Skin: No rashes, lesions or ulcers Psychiatry: Judgement and insight appear normal. Mood & affect appropriate.     Follow-up Information    Primary care provider. Schedule an appointment as soon as possible for a visit in 2 day(s).   Why:  Hospital follow-up          Signed: Reyne Dumas 01/30/2016, 10:19 AM        Time spent >45 mins

## 2016-01-30 NOTE — Progress Notes (Signed)
ANTICOAGULATION CONSULT NOTE  Pharmacy Consult for warfarin and enoxaparin Indication: Portal Vein Thrombosis  Labs:  Recent Labs  01/27/16 1500  01/28/16 0610 01/29/16 0425 01/30/16 0505  HGB  --   < > 10.6* 10.0* 10.0*  HCT  --   --  31.9* 30.5* 30.1*  PLT  --   --  143* 143* 158  LABPROT  --   --  19.8* 18.7* 19.6*  INR  --   --  1.68* 1.55* 1.66  HEPARINUNFRC 0.65  --  0.51  --   --   CREATININE  --   --  0.63 0.68 0.71  < > = values in this interval not displayed.  Estimated Creatinine Clearance: 107.5 mL/min (by C-G formula based on SCr of 0.8 mg/dL).  Assessment: 65 y/o M with portal vein thrombosis on Lovenox and warfarin. Hgb stable today, melena reported 7/24, MD aware. Follow CBC closely. INR up slightly today. Transitioned to PO diet, eating most of his meals per RN. Will repeat higher dose today x 1 to try to get INR up.   Goal of Therapy:  Monitor platelets by anticoagulation protocol: Yes  INR 2-3   Plan:  -Continue enoxaparin 1 mg/kg (90 mg) q 12 hr  -Give warfarin 9 mg PO x1 dose -Monitor daily INR, CBC, clinical course, s/sx of bleed, PO intake, DDI  Thank you for allowing [place] to participate in this patients care. Jens Som, PharmD Pager: [phone] 01/30/2016 10:21 AM

## 2017-03-19 ENCOUNTER — Encounter (HOSPITAL_COMMUNITY): Payer: Self-pay | Admitting: Emergency Medicine

## 2017-03-19 ENCOUNTER — Emergency Department (HOSPITAL_COMMUNITY): Payer: Non-veteran care

## 2017-03-19 ENCOUNTER — Inpatient Hospital Stay (HOSPITAL_COMMUNITY)
Admission: EM | Admit: 2017-03-19 | Discharge: 2017-03-24 | DRG: 247 | Disposition: A | Discharge status: Home / Self Care | Payer: Non-veteran care | Attending: Family Medicine | Admitting: Family Medicine

## 2017-03-19 DIAGNOSIS — I81 Portal vein thrombosis: Secondary | ICD-10-CM

## 2017-03-19 DIAGNOSIS — Z86718 Personal history of other venous thrombosis and embolism: Secondary | ICD-10-CM

## 2017-03-19 DIAGNOSIS — I10 Essential (primary) hypertension: Secondary | ICD-10-CM

## 2017-03-19 DIAGNOSIS — E119 Type 2 diabetes mellitus without complications: Secondary | ICD-10-CM

## 2017-03-19 DIAGNOSIS — Z7982 Long term (current) use of aspirin: Secondary | ICD-10-CM

## 2017-03-19 DIAGNOSIS — K7469 Other cirrhosis of liver: Secondary | ICD-10-CM | POA: Diagnosis not present

## 2017-03-19 DIAGNOSIS — I2510 Atherosclerotic heart disease of native coronary artery without angina pectoris: Secondary | ICD-10-CM | POA: Diagnosis not present

## 2017-03-19 DIAGNOSIS — Z85528 Personal history of other malignant neoplasm of kidney: Secondary | ICD-10-CM

## 2017-03-19 DIAGNOSIS — Z888 Allergy status to other drugs, medicaments and biological substances status: Secondary | ICD-10-CM

## 2017-03-19 DIAGNOSIS — K7581 Nonalcoholic steatohepatitis (NASH): Secondary | ICD-10-CM | POA: Diagnosis present

## 2017-03-19 DIAGNOSIS — R079 Chest pain, unspecified: Secondary | ICD-10-CM | POA: Diagnosis not present

## 2017-03-19 DIAGNOSIS — I25110 Atherosclerotic heart disease of native coronary artery with unstable angina pectoris: Secondary | ICD-10-CM | POA: Diagnosis not present

## 2017-03-19 DIAGNOSIS — I2583 Coronary atherosclerosis due to lipid rich plaque: Secondary | ICD-10-CM

## 2017-03-19 DIAGNOSIS — Z905 Acquired absence of kidney: Secondary | ICD-10-CM

## 2017-03-19 DIAGNOSIS — Z955 Presence of coronary angioplasty implant and graft: Secondary | ICD-10-CM

## 2017-03-19 DIAGNOSIS — K7460 Unspecified cirrhosis of liver: Secondary | ICD-10-CM | POA: Diagnosis present

## 2017-03-19 DIAGNOSIS — Z7984 Long term (current) use of oral hypoglycemic drugs: Secondary | ICD-10-CM

## 2017-03-19 DIAGNOSIS — Z8249 Family history of ischemic heart disease and other diseases of the circulatory system: Secondary | ICD-10-CM

## 2017-03-19 DIAGNOSIS — Z880 Allergy status to penicillin: Secondary | ICD-10-CM

## 2017-03-19 DIAGNOSIS — Z79899 Other long term (current) drug therapy: Secondary | ICD-10-CM

## 2017-03-19 DIAGNOSIS — Z7901 Long term (current) use of anticoagulants: Secondary | ICD-10-CM

## 2017-03-19 DIAGNOSIS — E7800 Pure hypercholesterolemia, unspecified: Secondary | ICD-10-CM | POA: Diagnosis present

## 2017-03-19 DIAGNOSIS — I25119 Atherosclerotic heart disease of native coronary artery with unspecified angina pectoris: Secondary | ICD-10-CM

## 2017-03-19 HISTORY — DX: Liver disease, unspecified: K76.9

## 2017-03-19 HISTORY — DX: Atherosclerotic heart disease of native coronary artery without angina pectoris: I25.10

## 2017-03-19 LAB — BASIC METABOLIC PANEL
Anion gap: 8 (ref 5–15)
BUN: 14 mg/dL (ref 6–20)
CHLORIDE: 106 mmol/L (ref 101–111)
CO2: 23 mmol/L (ref 22–32)
Calcium: 8.8 mg/dL — ABNORMAL LOW (ref 8.9–10.3)
Creatinine, Ser: 0.75 mg/dL (ref 0.61–1.24)
GFR calc Af Amer: >60 mL/min (ref >60)
GFR calc non Af Amer: >60 mL/min (ref >60)
GLUCOSE: 133 mg/dL — AB (ref 65–99)
POTASSIUM: 3.9 mmol/L (ref 3.5–5.1)
Sodium: 137 mmol/L (ref 135–145)

## 2017-03-19 LAB — CBC
HCT: 34.5 % — ABNORMAL LOW (ref 39.0–52.0)
HEMOGLOBIN: 11.1 g/dL — AB (ref 13.0–17.0)
MCH: 27.9 pg (ref 26.0–34.0)
MCHC: 32.2 g/dL (ref 30.0–36.0)
MCV: 86.7 fL (ref 78.0–100.0)
Platelets: 120 10*3/uL — ABNORMAL LOW (ref 150–400)
RBC: 3.98 MIL/uL — AB (ref 4.22–5.81)
RDW: 14.6 % (ref 11.5–15.5)
WBC: 5.1 10*3/uL (ref 4.0–10.5)

## 2017-03-19 LAB — PROTIME-INR
INR: 2.95
PROTHROMBIN TIME: 30.5 s — AB (ref 11.4–15.2)

## 2017-03-19 LAB — I-STAT TROPONIN, ED
Troponin i, poc: 0 ng/mL (ref 0.00–0.08)
Troponin i, poc: 0.01 ng/mL (ref 0.00–0.08)

## 2017-03-19 NOTE — ED Provider Notes (Signed)
[facility] DEPT Provider Note   CSN: [id number] Arrival date & time: 03/19/17  1710     History   Chief Complaint Chief Complaint  Patient presents with  . Chest Pain    HPI Jerry [name] is a 66 y.o. male.  He presents for evaluation of chest discomfort described as pressure, and associated with a pressure sensation in his head.  Because of the discomfort he took a nitroglycerin which helped and it resolved the pain.  The pain started while he was moving some heavy boxes.  He has not used nitroglycerin and more than 1 year.  His pain returned and he took a second nitroglycerin.  EMS was at his home, and treated him with an additional nitroglycerin, and 4 baby aspirin.  When seen in the emergency department, he was pain-free.  He had some nausea earlier while he had the pain but that has resolved.  He has been generally weak over the last 1-2 days, but otherwise able to do his usual activities.  He is taking his usual medications.  He denies recent fever, chills, cough, change in bowel or urinary habits.  He states that he has previously had a cardiac stent, and has an additional "70% blockage."  Stenting was in 2010.  He gets cardiac care at the [place].  There are no other known modifying factors.  HPI  Past Medical History:  Diagnosis Date  . Diabetes mellitus without complication ([place])   . Hypercholesteremia   . Hypertension   . Primary cancer of kidney or ureter     Patient Active Problem List   Diagnosis Date Noted  . Chest pain 03/19/2017  . Portal vein thrombosis 01/21/2016  . Diabetes mellitus ([place]) 01/21/2016  . CAD (coronary artery disease) 01/21/2016  . Essential hypertension     Past Surgical History:  Procedure Laterality Date  . SHOULDER SURGERY    . STENT PLACEMENT VASCULAR ([place])         Home Medications    Prior to Admission medications   Medication Sig Start Date End Date Taking? Authorizing Provider  acetaminophen (TYLENOL) 325 MG tablet  Take 975 mg by mouth 3 (three) times daily as needed for mild pain or fever.   Yes [provider]  alprostadil (MUSE) 1000 MCG pellet 1,000 mcg by Transurethral route See admin instructions. Every 96 hours as needed for erection   Yes [provider]  aspirin 81 MG chewable tablet Chew 81 mg by mouth daily.   Yes [provider]  carvedilol (COREG) 6.25 MG tablet Take 6.25 mg by mouth every morning.    Yes [provider]  finasteride (PROSCAR) 5 MG tablet Take 5 mg by mouth daily.   Yes [provider]  gabapentin (NEURONTIN) 300 MG capsule Take 300 mg by mouth 2 (two) times daily.   Yes [provider]  glipiZIDE (GLUCOTROL) 5 MG tablet Take 1 tablet (5 mg total) by mouth 2 (two) times daily before a meal. 01/30/16  Yes Reyne Dumas, MD  isosorbide dinitrate (ISORDIL) 30 MG tablet Take 30-90 mg by mouth See admin instructions. 75m in the morning and 381min the evening   Yes [provider]  lisinopril (PRINIVIL,ZESTRIL) 5 MG tablet Take 2.5 mg by mouth daily.   Yes [provider]  magnesium citrate SOLN Take 296 mLs (1 Bottle total) by mouth once as needed for severe constipation. 01/30/16  Yes AbReyne DumasMD  magnesium oxide (MAG-OX) 400 MG tablet Take 400  mg by mouth every evening.    Yes [provider]  metFORMIN (GLUCOPHAGE) 1000 MG tablet Take 1,000 mg by mouth 2 (two) times daily with a meal.   Yes [provider]  nitroGLYCERIN (NITROSTAT) 0.4 MG SL tablet Place 0.4 mg under the tongue every 5 (five) minutes as needed for chest pain.   Yes [provider]  omeprazole (PRILOSEC) 20 MG capsule Take 20 mg by mouth every evening.   Yes [provider]  rifaximin (XIFAXAN) 550 MG TABS tablet Take 550 mg by mouth 2 (two) times daily.   Yes [provider]  rosuvastatin (CRESTOR) 10 MG tablet Take 10 mg by mouth daily.    Yes [provider]  senna-docusate  (SENOKOT-S) 8.6-50 MG tablet Take 1 tablet by mouth at bedtime as needed for mild constipation. 01/30/16  Yes Reyne Dumas, MD  simethicone (MYLICON) 80 MG chewable tablet Chew 160 mg by mouth 2 (two) times daily.    Yes [provider]  warfarin (COUMADIN) 10 MG tablet Take 1 tablet (10 mg total) by mouth one time only at 6 PM. Patient taking differently: Take 10-12.5 mg by mouth See admin instructions. 46m every day in evening, except on Thursdays 12.539m7/26/17 03/19/17 Yes AbReyne DumasMD  enoxaparin (LOVENOX) 150 MG/ML injection Inject 0.59 mLs (90 mg total) into the skin every 12 (twelve) hours. 01/30/16 01/31/16  AbReyne DumasMD  feeding supplement, GLUCERNA SHAKE, (GLUCERNA SHAKE) LIQD Take 237 mLs by mouth 2 (two) times daily between meals. Patient not taking: Reported on 03/19/2017 01/30/16   AbReyne DumasMD  polyethylene glycol (MIRALAX / GLYCOLAX) packet Take 17 g by mouth 2 (two) times daily. Patient not taking: Reported on 03/19/2017 01/30/16   AbReyne DumasMD    Family History Family History  Problem Relation Age of Onset  . Stroke Mother   . Heart attack Mother   . Stroke Father   . Heart attack Father     Social History Social History  Substance Use Topics  . Smoking status: Never Smoker  . Smokeless tobacco: Never Used  . Alcohol use No     Allergies   Metoprolol; Penicillins; and Terazosin   Review of Systems Review of Systems  All other systems reviewed and are negative.    Physical Exam Updated Vital Signs BP (!) 146/70   Pulse 63   Resp 18   Wt 93 kg (205 lb)   SpO2 97%   BMI 28.59 kg/m   Physical Exam  Constitutional: He is oriented to person, place, and time. He appears well-developed and well-nourished. No distress.  HENT:  Head: Normocephalic and atraumatic.  Right Ear: External ear normal.  Left Ear: External ear normal.  Eyes: Pupils are equal, round, and reactive to light. Conjunctivae and EOM are normal.  Neck: Normal  range of motion and phonation normal. Neck supple.  Cardiovascular: Normal rate, regular rhythm and normal heart sounds.   Pulmonary/Chest: Effort normal and breath sounds normal. He exhibits no bony tenderness.  Abdominal: Soft. There is no tenderness.  Musculoskeletal: Normal range of motion.  Neurological: He is alert and oriented to person, place, and time. No cranial nerve deficit or sensory deficit. He exhibits normal muscle tone. Coordination normal.  Skin: Skin is warm, dry and intact.  Psychiatric: He has a normal mood and affect. His behavior is normal. Judgment and thought content normal.  Nursing note and vitals reviewed.    ED Treatments / Results  Labs (all labs  ordered are listed, but only abnormal results are displayed) Labs Reviewed  BASIC METABOLIC PANEL - Abnormal; Notable for the following:       Result Value   Glucose, Bld 133 (*)    Calcium 8.8 (*)    All other components within normal limits  CBC - Abnormal; Notable for the following:    RBC 3.98 (*)    Hemoglobin 11.1 (*)    HCT 34.5 (*)    Platelets 120 (*)    All other components within normal limits  PROTIME-INR - Abnormal; Notable for the following:    Prothrombin Time 30.5 (*)    All other components within normal limits  CBG MONITORING, ED  I-STAT TROPONIN, ED  I-STAT TROPONIN, ED    EKG  EKG Interpretation  Date/Time:  Thursday March 19 2017 17:26:00 EDT Ventricular Rate:  66 PR Interval:    QRS Duration: 123 QT Interval:  402 QTC Calculation: 422 R Axis:   -11 Text Interpretation:  Sinus rhythm Nonspecific intraventricular conduction delay since last tracing no significant change Confirmed by Daleen Bo [phone]) on 03/19/2017 5:44:32 PM       Radiology Dg Chest Portable 1 View  Result Date: 03/19/2017 CLINICAL DATA:  Intermittent chest pain EXAM: PORTABLE CHEST 1 VIEW COMPARISON:  01/29/2016 FINDINGS: Borderline cardiomegaly, accentuated by portable technique. Mild  interstitial crowding at the bases. There is no edema, consolidation, effusion, or pneumothorax. IMPRESSION: No evidence of active disease. Electronically Signed   By: Monte Fantasia M.D.   On: 03/19/2017 19:28    Procedures Procedures (including critical care time)  Medications Ordered in ED Medications - No data to display   Initial Impression / Assessment and Plan / ED Course  I have reviewed the triage vital signs and the nursing notes.  Pertinent labs & imaging results that were available during my care of the patient were reviewed by me and considered in my medical decision making (see chart for details).  Clinical Course as of Mar 19 2329  Thu Mar 19, 2017  1916 Patient has clinical history and exam consistent with unstable angina, currently pain-free.  Will check troponin, and determine care plan at that point.  [EW]    Clinical Course User Index [EW] Daleen Bo, MD     Patient Vitals for the past 24 hrs:  BP Pulse Resp SpO2 Weight  03/19/17 2030 (!) 146/70 63 18 97 % -  03/19/17 2000 (!) 146/71 63 20 97 % -  03/19/17 1930 (!) 142/70 63 19 97 % -  03/19/17 1845 - (!) 59 18 97 % -  03/19/17 1830 140/65 63 18 98 % -  03/19/17 1815 - 66 17 98 % -  03/19/17 1800 (!) 142/71 70 18 98 % -  03/19/17 1736 - - - - 93 kg (205 lb)  03/19/17 1730 (!) 142/69 61 19 98 % -  03/19/17 1729 - - - 98 % -    9:09 PM Reevaluation with update and discussion. After initial assessment and treatment, an updated evaluation reveals no change in clinical status.  He remains chest pain-free.Daleen Bo L   9:10 PM-Consult complete with hospitalist. Patient case explained and discussed.  He agrees to admit patient for further evaluation and treatment. Call ended at 21: 15     Final Clinical Impressions(s) / ED Diagnoses   Final diagnoses:  Chest pain, unspecified type    Nonspecific chest pain with coronary history.  No clear evidence for ACS, PE or pneumonia.  Preceding  known  coronary disease with stenting.  Moderate risk and will require admission for trending troponin, and consider provocative testing versus catheterization.  ]Nursing Notes Reviewed/ Care Coordinated Applicable Imaging Reviewed Interpretation of Laboratory Data incorporated into ED treatment  Plan: Admit  New Prescriptions New Prescriptions   No medications on file     Daleen Bo, MD 03/19/17 [phone]

## 2017-03-19 NOTE — ED Triage Notes (Signed)
Pt in from home via [facility] with c/o intermittent cp since this morning that began while pt was lifting boxes. No apparent distress currently, 3/10 pain. A&OX4, had some nausea and paleness on scene

## 2017-03-20 ENCOUNTER — Encounter (HOSPITAL_COMMUNITY): Payer: Self-pay

## 2017-03-20 DIAGNOSIS — Z888 Allergy status to other drugs, medicaments and biological substances status: Secondary | ICD-10-CM | POA: Diagnosis not present

## 2017-03-20 DIAGNOSIS — Z86718 Personal history of other venous thrombosis and embolism: Secondary | ICD-10-CM | POA: Diagnosis not present

## 2017-03-20 DIAGNOSIS — I2510 Atherosclerotic heart disease of native coronary artery without angina pectoris: Secondary | ICD-10-CM | POA: Diagnosis not present

## 2017-03-20 DIAGNOSIS — Z905 Acquired absence of kidney: Secondary | ICD-10-CM | POA: Diagnosis not present

## 2017-03-20 DIAGNOSIS — Z7901 Long term (current) use of anticoagulants: Secondary | ICD-10-CM | POA: Diagnosis not present

## 2017-03-20 DIAGNOSIS — Z955 Presence of coronary angioplasty implant and graft: Secondary | ICD-10-CM | POA: Diagnosis not present

## 2017-03-20 DIAGNOSIS — E7800 Pure hypercholesterolemia, unspecified: Secondary | ICD-10-CM | POA: Diagnosis present

## 2017-03-20 DIAGNOSIS — K7469 Other cirrhosis of liver: Secondary | ICD-10-CM | POA: Diagnosis present

## 2017-03-20 DIAGNOSIS — I81 Portal vein thrombosis: Secondary | ICD-10-CM | POA: Diagnosis not present

## 2017-03-20 DIAGNOSIS — R079 Chest pain, unspecified: Secondary | ICD-10-CM | POA: Diagnosis present

## 2017-03-20 DIAGNOSIS — Z7984 Long term (current) use of oral hypoglycemic drugs: Secondary | ICD-10-CM | POA: Diagnosis not present

## 2017-03-20 DIAGNOSIS — Z7982 Long term (current) use of aspirin: Secondary | ICD-10-CM | POA: Diagnosis not present

## 2017-03-20 DIAGNOSIS — E119 Type 2 diabetes mellitus without complications: Secondary | ICD-10-CM | POA: Diagnosis present

## 2017-03-20 DIAGNOSIS — Z85528 Personal history of other malignant neoplasm of kidney: Secondary | ICD-10-CM | POA: Diagnosis not present

## 2017-03-20 DIAGNOSIS — Z8249 Family history of ischemic heart disease and other diseases of the circulatory system: Secondary | ICD-10-CM | POA: Diagnosis not present

## 2017-03-20 DIAGNOSIS — Z79899 Other long term (current) drug therapy: Secondary | ICD-10-CM | POA: Diagnosis not present

## 2017-03-20 DIAGNOSIS — I10 Essential (primary) hypertension: Secondary | ICD-10-CM | POA: Diagnosis present

## 2017-03-20 DIAGNOSIS — I214 Non-ST elevation (NSTEMI) myocardial infarction: Secondary | ICD-10-CM

## 2017-03-20 DIAGNOSIS — Z880 Allergy status to penicillin: Secondary | ICD-10-CM | POA: Diagnosis not present

## 2017-03-20 DIAGNOSIS — I25110 Atherosclerotic heart disease of native coronary artery with unstable angina pectoris: Secondary | ICD-10-CM | POA: Diagnosis present

## 2017-03-20 LAB — GLUCOSE, CAPILLARY
GLUCOSE-CAPILLARY: 185 mg/dL — AB (ref 65–99)
Glucose-Capillary: 183 mg/dL — ABNORMAL HIGH (ref 65–99)
Glucose-Capillary: 177 mg/dL — ABNORMAL HIGH (ref 65–99)

## 2017-03-20 LAB — CBG MONITORING, ED: GLUCOSE-CAPILLARY: 120 mg/dL — AB (ref 65–99)

## 2017-03-20 LAB — LIPID PANEL
CHOL/HDL RATIO: 5.2 ratio
Cholesterol: 134 mg/dL (ref 0–200)
HDL: 26 mg/dL — ABNORMAL LOW (ref >40)
LDL CALC: 71 mg/dL (ref 0–99)
Triglycerides: 185 mg/dL — ABNORMAL HIGH (ref <150)
VLDL: 37 mg/dL (ref 0–40)

## 2017-03-20 LAB — PROTIME-INR
INR: 2.5
Prothrombin Time: 26.8 seconds — ABNORMAL HIGH (ref 11.4–15.2)

## 2017-03-20 LAB — TROPONIN I
TROPONIN I: 0.08 ng/mL — AB (ref <0.03)
Troponin I: 0.08 ng/mL (ref <0.03)

## 2017-03-20 MED ORDER — INSULIN ASPART 100 UNIT/ML ~~LOC~~ SOLN
0.0000 [IU] | Freq: Four times a day (QID) | SUBCUTANEOUS | Status: DC
  Administered 2017-03-20: 3 [IU] via SUBCUTANEOUS

## 2017-03-20 MED ORDER — FINASTERIDE 5 MG PO TABS
5.0000 mg | ORAL_TABLET | Freq: Every day | ORAL | Status: DC
  Administered 2017-03-20 – 2017-03-24 (×5): 5 mg via ORAL
  Filled 2017-03-20 (×5): qty 1

## 2017-03-20 MED ORDER — ASPIRIN 81 MG PO CHEW
81.0000 mg | CHEWABLE_TABLET | Freq: Every day | ORAL | Status: DC
  Administered 2017-03-20 – 2017-03-22 (×3): 81 mg via ORAL
  Filled 2017-03-20 (×4): qty 1

## 2017-03-20 MED ORDER — LISINOPRIL 5 MG PO TABS
2.5000 mg | ORAL_TABLET | Freq: Every day | ORAL | Status: DC
  Administered 2017-03-20 – 2017-03-24 (×5): 2.5 mg via ORAL
  Filled 2017-03-20 (×5): qty 1

## 2017-03-20 MED ORDER — PANTOPRAZOLE SODIUM 40 MG PO TBEC
40.0000 mg | DELAYED_RELEASE_TABLET | Freq: Every day | ORAL | Status: DC
  Administered 2017-03-20 – 2017-03-24 (×5): 40 mg via ORAL
  Filled 2017-03-20 (×5): qty 1

## 2017-03-20 MED ORDER — WARFARIN - PHARMACIST DOSING INPATIENT
Freq: Every day | Status: DC
Start: 2017-03-20 — End: 2017-03-20

## 2017-03-20 MED ORDER — ROSUVASTATIN CALCIUM 10 MG PO TABS
10.0000 mg | ORAL_TABLET | Freq: Every day | ORAL | Status: DC
  Administered 2017-03-20 – 2017-03-23 (×4): 10 mg via ORAL
  Filled 2017-03-20 (×5): qty 1

## 2017-03-20 MED ORDER — ISOSORBIDE DINITRATE 30 MG PO TABS
90.0000 mg | ORAL_TABLET | Freq: Every day | ORAL | Status: DC
  Filled 2017-03-20: qty 3

## 2017-03-20 MED ORDER — ACETAMINOPHEN 325 MG PO TABS: 650.0000 mg | ORAL_TABLET | ORAL | Status: DC | PRN

## 2017-03-20 MED ORDER — WARFARIN SODIUM 10 MG PO TABS
10.0000 mg | ORAL_TABLET | ORAL | Status: AC
  Administered 2017-03-20: 10 mg via ORAL
  Filled 2017-03-20: qty 1

## 2017-03-20 MED ORDER — ONDANSETRON HCL 4 MG/2ML IJ SOLN: 4.0000 mg | Freq: Four times a day (QID) | INTRAMUSCULAR | Status: DC | PRN

## 2017-03-20 MED ORDER — RIFAXIMIN 550 MG PO TABS
550.0000 mg | ORAL_TABLET | Freq: Two times a day (BID) | ORAL | Status: DC
  Administered 2017-03-20 – 2017-03-24 (×10): 550 mg via ORAL
  Filled 2017-03-20 (×11): qty 1

## 2017-03-20 MED ORDER — GABAPENTIN 300 MG PO CAPS
300.0000 mg | ORAL_CAPSULE | Freq: Two times a day (BID) | ORAL | Status: DC
  Administered 2017-03-20 – 2017-03-24 (×9): 300 mg via ORAL
  Filled 2017-03-20 (×9): qty 1

## 2017-03-20 MED ORDER — INSULIN ASPART 100 UNIT/ML ~~LOC~~ SOLN
0.0000 [IU] | Freq: Three times a day (TID) | SUBCUTANEOUS | Status: DC
  Administered 2017-03-20: 3 [IU] via SUBCUTANEOUS
  Administered 2017-03-21: 5 [IU] via SUBCUTANEOUS
  Administered 2017-03-21: 3 [IU] via SUBCUTANEOUS
  Administered 2017-03-21: 5 [IU] via SUBCUTANEOUS
  Administered 2017-03-22 (×2): 3 [IU] via SUBCUTANEOUS
  Administered 2017-03-22: 2 [IU] via SUBCUTANEOUS
  Administered 2017-03-23: 3 [IU] via SUBCUTANEOUS
  Administered 2017-03-23: 12:00:00 2 [IU] via SUBCUTANEOUS
  Administered 2017-03-24: 08:00:00 3 [IU] via SUBCUTANEOUS

## 2017-03-20 MED ORDER — ISOSORBIDE DINITRATE 30 MG PO TABS: 30.0000 mg | ORAL_TABLET | Freq: Every day | ORAL | Status: DC

## 2017-03-20 MED ORDER — NITROGLYCERIN 0.4 MG SL SUBL: 0.4000 mg | SUBLINGUAL_TABLET | SUBLINGUAL | Status: DC | PRN

## 2017-03-20 MED ORDER — WARFARIN SODIUM 10 MG PO TABS: 10.0000 mg | ORAL_TABLET | Freq: Once | ORAL | Status: DC

## 2017-03-20 MED ORDER — GI COCKTAIL ~~LOC~~: 30.0000 mL | Freq: Four times a day (QID) | ORAL | Status: DC | PRN

## 2017-03-20 MED ORDER — ISOSORBIDE DINITRATE 30 MG PO TABS: 30.0000 mg | ORAL_TABLET | ORAL | Status: DC

## 2017-03-20 NOTE — Progress Notes (Signed)
PROGRESS NOTE  Jerry [name]  NGE:[id number]  DOB: 03/29/1951  DOA: 03/19/2017 PCP: Deberah Pelton, MD   Brief Admission Hx: Jerry [name] is a 66 y.o. 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  MDM/Assessment & Plan:    1. Chest pain / Unstable Angina The patient has HEART score of 6. Angina is typical.  Other potential causes of chest pain (PE, dissection, pancreatitis, pneumonia/effusion, pericarditis) are doubted.  Cardiology is planning intervention when the INR is down and planning to start IV heparin infusion.  See notes.    2. Hypertension and coronary disease secondary prevention: -Continue Imdur, lisinopril, statin  3. History of portal vein thrombosis:  -Holding warfarin per cardiology service and will start heparin infusion.   4. Cirrhosis:  -Continue rifaximin  5. Diabetes:  -Hold metformin and glipizide -SSI ordered as needed  CBG (last 3)   Recent Labs  03/20/17 0607 03/20/17 1150  GLUCAP 120* 185*    6. Other medications:  -Continue finasteride, gabapentin, PPI  DVT prophylaxis: warfarin Code Status: FULL  Family Communication: bedside Disposition Plan: TBD Consults called: Cardiology  Admission status: Telemetry  Subjective: Pt says that he is starting to feel a little better this morning.  He denies SOB.    Objective: Vitals:   03/20/17 0530 03/20/17 0600 03/20/17 0700 03/20/17 0815  BP: (!) 130/57 (!) 141/75 (!) 154/71 135/68  Pulse: (!) 57 (!) 57 (!) 56 (!) 58  Resp: 17 19 16 20   Temp:    98.7 F (37.1 C)  TempSrc:    Oral  SpO2: 97% 98% 96% 97%  Weight:    90.7 kg (200 lb)  Height:    5' 11"  (1.803 m)   No intake or output data in the 24 hours ending 03/20/17 1250 Filed Weights   03/19/17 1736 03/20/17 0815  Weight: 93  kg (205 lb) 90.7 kg (200 lb)     REVIEW OF SYSTEMS  As per history otherwise all reviewed and reported negative  Exam:  General exam: chronically ill appearing.  NAD. Cooperative.  Respiratory system: Clear. No increased work of breathing. Cardiovascular system: S1 & S2 heard.   Gastrointestinal system: Abdomen is soft and nontender. Normal bowel sounds heard. Central nervous system: Alert and oriented. No focal neurological deficits. Extremities: no cyanosis seen.   Data Reviewed: Basic Metabolic Panel:  Recent Labs Lab 03/19/17 1940  NA 137  K 3.9  CL 106  CO2 23  GLUCOSE 133*  BUN 14  CREATININE 0.75  CALCIUM 8.8*   Liver Function Tests: No results for input(s): AST, ALT, ALKPHOS, BILITOT, PROT, ALBUMIN in the last 168 hours. No results for input(s): LIPASE, AMYLASE in the last 168 hours. No results for input(s): AMMONIA in the last 168 hours. CBC:  Recent Labs Lab 03/19/17 1940  WBC 5.1  HGB 11.1*  HCT 34.5*  MCV 86.7  PLT 120*   Cardiac Enzymes:  Recent Labs Lab 03/20/17 0419 03/20/17 1031  TROPONINI 0.08* 0.08*   CBG (last 3)   Recent Labs  03/20/17 0607 03/20/17 1150  GLUCAP 120* 185*   No results found for this or any previous visit (from the past 240 hour(s)).   Studies: Dg Chest Portable 1 View  Result Date: 03/19/2017 CLINICAL DATA:  Intermittent chest pain EXAM: PORTABLE CHEST  1 VIEW COMPARISON:  01/29/2016 FINDINGS: Borderline cardiomegaly, accentuated by portable technique. Mild interstitial crowding at the bases. There is no edema, consolidation, effusion, or pneumothorax. IMPRESSION: No evidence of active disease. Electronically Signed   By: Monte Fantasia M.D.   On: 03/19/2017 19:28   Scheduled Meds: . aspirin  81 mg Oral Daily  . finasteride  5 mg Oral Daily  . gabapentin  300 mg Oral BID  . insulin aspart  0-15 Units Subcutaneous Q6H  . lisinopril  2.5 mg Oral Daily  . pantoprazole  40 mg Oral Daily  . rifaximin  550 mg  Oral BID  . rosuvastatin  10 mg Oral Daily   Continuous Infusions:  Principal Problem:   Chest pain Active Problems:   Portal vein thrombosis   Type 2 diabetes mellitus without complication, without long-term current use of insulin ([facility])   Coronary artery disease due to lipid rich plaque   Essential hypertension   Other cirrhosis of liver ([place])  Time spent:   Irwin Brakeman, MD, FAAFP [facility] Pager [phone]  If 7PM-7AM, please contact night-coverage [email or web address] Password [email or web address] 03/20/2017, 12:50 PM    LOS: 0 days

## 2017-03-20 NOTE — Progress Notes (Addendum)
ANTICOAGULATION CONSULT NOTE  Pharmacy Consult:  Coumadin Indication:  History of VTE  Allergies  Allergen Reactions  . Metoprolol Other (See Comments)    bradycardia  . Penicillins Swelling    Swelling at injection site Has patient had a PCN reaction causing immediate rash, facial/tongue/throat swelling, SOB or lightheadedness with hypotension: No Has patient had a PCN reaction causing severe rash involving mucus membranes or skin necrosis: No Has patient had a PCN reaction that required hospitalization No Has patient had a PCN reaction occurring within the last 10 years: No If all of the above answers are "NO", then may proceed with Cephalosporin use.  . Terazosin Other (See Comments)    Orthostatic hypotension    Patient Measurements: Weight: 205 lb (93 kg)  Vital Signs: BP: 154/71 (09/14 0700) Pulse Rate: 56 (09/14 0700)  Labs:  Recent Labs  03/19/17 1940 03/19/17 1947 03/20/17 0419 03/20/17 0610  HGB 11.1*  --   --   --   HCT 34.5*  --   --   --   PLT 120*  --   --   --   LABPROT  --  30.5*  --  26.8*  INR  --  2.95  --  2.50  CREATININE 0.75  --   --   --   TROPONINI  --   --  0.08*  --     CrCl cannot be calculated (Unknown ideal weight.).  Assessment: 43 YOM presented with chest pain.  Pharmacy consulted to continue Coumadin from PTA for history of portal vein thrombosis.  No missed doses recently and he last took Coumadin on 03/18/17.  Patient's INR is therapeutic at 2.5. No bleeding noted.  Home Coumadin dose: 45m daily except 12.571mon Thurs  Goal of Therapy:  INR 2-3  Plan:  Warfarin 1051mO x 1 tonight Daily INR  RacSalome ArntharmD, BCPS 03/20/2017 8:12 AM  Addendum: Transitioning warfarin to heparin in anticipation of cardiac cath on Monday. No further coumadin and will start IV heparin when INR is <2.   RacSalome ArntharmD, BCPS 03/20/2017 10:24 AM

## 2017-03-20 NOTE — Progress Notes (Signed)
ANTICOAGULATION CONSULT NOTE - Initial Consult  Pharmacy Consult:  Coumadin Indication:  History of VTE  Allergies  Allergen Reactions  . Metoprolol Other (See Comments)    bradycardia  . Penicillins Swelling    Swelling at injection site Has patient had a PCN reaction causing immediate rash, facial/tongue/throat swelling, SOB or lightheadedness with hypotension: No Has patient had a PCN reaction causing severe rash involving mucus membranes or skin necrosis: No Has patient had a PCN reaction that required hospitalization No Has patient had a PCN reaction occurring within the last 10 years: No If all of the above answers are "NO", then may proceed with Cephalosporin use.  . Terazosin Other (See Comments)    Orthostatic hypotension    Patient Measurements: Weight: 205 lb (93 kg)  Vital Signs: BP: 129/55 (09/14 0230) Pulse Rate: 62 (09/14 0230)  Labs:  Recent Labs  09/13/Jerry 1940 09/13/Jerry 1947  HGB 11.1*  --   HCT 34.5*  --   PLT 120*  --   LABPROT  --  30.5*  INR  --  2.95  CREATININE 0.75  --     CrCl cannot be calculated (Unknown ideal weight.).   Medical History: Past Medical History:  Diagnosis Date  . Diabetes mellitus without complication ([place])   . Hypercholesteremia   . Hypertension   . Primary cancer of kidney or ureter       Assessment: Jerry [name] presented with chest pain.  Pharmacy consulted to continue Coumadin from PTA for history of portal vein thrombosis.  No missed doses recently and he last took Coumadin on 9/12/Jerry.  Patient's INR is therapeutic and toward the high end of normal.  No bleeding documented.  Home Coumadin dose: 25m daily except 12.550mon Thurs   Goal of Therapy:  INR 2-3    Plan:  Coumadin 1049mO now (for 9/13 dose) Daily PT / INR   Journiee Feldkamp D. DanMina MarbleharmD, BCPS Pager:  [phone], 2:53 AM

## 2017-03-20 NOTE — Consult Note (Signed)
Cardiology Consultation:   Patient ID: AVYON HERENDEEN; [id number]; [birth year]   Admit date: 03/19/2017 Date of Consult: 03/20/2017  Primary Care Provider: Deberah Pelton, MD Primary Cardiologist: [facility]   Patient Profile:   Jerry [name] is a 66 y.o. male with a hx of CAD with prior stenting in 2010 @ [place], Portal vein thrombosis on warfarin, cirrhosis, DM, HTN, renal cancer s/p partial L nephrectomy in 2001 who is being seen today for the evaluation of chest pain and elevated troponin  at the request of Dr. Wynetta Emery.   Prior cath 2004 & 2007 done at [facility]. Had stenting done at [place] in 2010. Also had 70% other blockage that treated medically. Unable to provide further information.   Cath 2007 Nonobstructive coronary artery disease with 50% narrowing in  the proximal LAD, 50% narrowing in the first and second diagonal branches,  irregularities in the circumflex artery, and 30% narrowing in the mid right  coronary artery with normal LV function.  History of Present Illness:   Mr. Dumont was in usual state of health up until a few weeks ago when he noticed fatigue with exertion. Yesterday he had an episode of substernal chest pressure with a burning sensation while mowing small boxes. Associated with shortness of breath, nausea, diaphoresis and pressure on his head. He took sublingual nitroglycerin x 1 with resolution of symptoms. However, seem to recur with minimal exertion leading to EMS called. Symptoms subsided after sublingual nitroglycerin x 2 and aspirin 325 mg. Admitted for observation. No recurrent pain since here. His symptoms is similar to prior angina when he had stent placement.   POC troponin 0.01. Troponin 0.08. His coumadin resumed yesterday. INR of 2.5 today. CXR without acute findings.   EKG:  The EKG was personally reviewed and demonstrates:  SR at rate of 66 bpm. No acute changes.  Telemetry:  Telemetry was personally reviewed and demonstrates:  Sinus rhythm at rate  of 60s  Past Medical History:  Diagnosis Date  . Diabetes mellitus without complication ([place])   . Hypercholesteremia   . Hypertension   . Primary cancer of kidney or ureter     Past Surgical History:  Procedure Laterality Date  . SHOULDER SURGERY    . STENT PLACEMENT VASCULAR ([place] HX)         Inpatient Medications: Scheduled Meds: . aspirin  81 mg Oral Daily  . finasteride  5 mg Oral Daily  . gabapentin  300 mg Oral BID  . insulin aspart  0-15 Units Subcutaneous Q6H  . lisinopril  2.5 mg Oral Daily  . pantoprazole  40 mg Oral Daily  . rifaximin  550 mg Oral BID  . rosuvastatin  10 mg Oral Daily  . warfarin  10 mg Oral ONCE-1800  . Warfarin - Pharmacist Dosing Inpatient   Does not apply q1800   Continuous Infusions:  PRN Meds: acetaminophen, gi cocktail, nitroGLYCERIN, ondansetron (ZOFRAN) IV  Allergies:    Allergies  Allergen Reactions  . Metoprolol Other (See Comments)    bradycardia  . Penicillins Swelling    Swelling at injection site Has patient had a PCN reaction causing immediate rash, facial/tongue/throat swelling, SOB or lightheadedness with hypotension: No Has patient had a PCN reaction causing severe rash involving mucus membranes or skin necrosis: No Has patient had a PCN reaction that required hospitalization No Has patient had a PCN reaction occurring within the last 10 years: No If all of the above answers are "NO", then may proceed with Cephalosporin  use.  . Terazosin Other (See Comments)    Orthostatic hypotension    Social History:   Social History   Social History  . Marital status: Divorced    Spouse name: N/A  . Number of children: N/A  . Years of education: N/A   Occupational History  . Not on file.   Social History Main Topics  . Smoking status: Never Smoker  . Smokeless tobacco: Never Used  . Alcohol use No  . Drug use: No  . Sexual activity: Not on file   Other Topics Concern  . Not on file   Social History Narrative    . No narrative on file    Family History:    Family History  Problem Relation Age of Onset  . Stroke Mother   . Heart attack Mother   . Stroke Father   . Heart attack Father      ROS:  Please see the history of present illness.  ROS All other ROS reviewed and negative.     Physical Exam/Data:   Vitals:   03/20/17 0530 03/20/17 0600 03/20/17 0700 03/20/17 0815  BP: (!) 130/57 (!) 141/75 (!) 154/71 135/68  Pulse: (!) 57 (!) 57 (!) 56 (!) 58  Resp: 17 19 16 20   Temp:    98.7 F (37.1 C)  TempSrc:    Oral  SpO2: 97% 98% 96% 97%  Weight:    200 lb (90.7 kg)  Height:    5' 11"  (1.803 m)   No intake or output data in the 24 hours ending 03/20/17 0949 Filed Weights   03/19/17 1736 03/20/17 0815  Weight: 205 lb (93 kg) 200 lb (90.7 kg)   Body mass index is 27.89 kg/m.  General:  Well nourished, well developed, in no acute distress HEENT: normal Lymph: no adenopathy Neck: no JVD Endocrine:  No thryomegaly Vascular: No carotid bruits; FA pulses 2+ bilaterally without bruits  Cardiac:  normal S1, S2; RRR; no murmur  Lungs:  clear to auscultation bilaterally, no wheezing, rhonchi or rales  Abd: soft, nontender, no hepatomegaly  Ext: no edema Musculoskeletal:  No deformities, BUE and BLE strength normal and equal Skin: warm and dry  Neuro:  CNs 2-12 intact, no focal abnormalities noted Psych:  Normal affect    Relevant CV Studies: Echo 01/22/16 Study Conclusions  - Left ventricle: The cavity size was normal. Systolic function was   normal. The estimated ejection fraction was in the range of 60%   to 65%. Wall motion was normal; there were no regional wall   motion abnormalities. There was an increased relative   contribution of atrial contraction to ventricular filling.   Doppler parameters are consistent with abnormal left ventricular   relaxation (grade 1 diastolic dysfunction). - Aortic valve: Trileaflet; normal thickness, mildly calcified   leaflets. There was  mild regurgitation. - Left atrium: The atrium was mildly dilated.  Laboratory Data:  Chemistry Recent Labs Lab 03/19/17 1940  NA 137  K 3.9  CL 106  CO2 23  GLUCOSE 133*  BUN 14  CREATININE 0.75  CALCIUM 8.8*  GFRNONAA >60  GFRAA >60  ANIONGAP 8    No results for input(s): PROT, ALBUMIN, AST, ALT, ALKPHOS, BILITOT in the last 168 hours. Hematology Recent Labs Lab 03/19/17 1940  WBC 5.1  RBC 3.98*  HGB 11.1*  HCT 34.5*  MCV 86.7  MCH 27.9  MCHC 32.2  RDW 14.6  PLT 120*   Cardiac Enzymes Recent Labs Lab 03/20/17 0419  TROPONINI 0.08*    Recent Labs Lab 03/19/17 1944 03/19/17 2252  TROPIPOC 0.00 0.01    BNPNo results for input(s): BNP, PROBNP in the last 168 hours.  DDimer No results for input(s): DDIMER in the last 168 hours.  Radiology/Studies:  Dg Chest Portable 1 View  Result Date: 03/19/2017 CLINICAL DATA:  Intermittent chest pain EXAM: PORTABLE CHEST 1 VIEW COMPARISON:  01/29/2016 FINDINGS: Borderline cardiomegaly, accentuated by portable technique. Mild interstitial crowding at the bases. There is no edema, consolidation, effusion, or pneumothorax. IMPRESSION: No evidence of active disease. Electronically Signed   By: Monte Fantasia M.D.   On: 03/19/2017 19:28    Assessment and Plan:   1. CAD/NSTEMI: Chest with prior hx of CAD s/p [facility] @ [facility]  In [facility] - His symptoms are similar to prior angina. Chest pain free since admit. EKG without ischemic changes. Troponin I of 0.08. Continue cycle troponin. Continue ASA and Crestor. INR of 2.25 today. Cath probably on Monday. Start IV heparin when INR less than 2.  2. HTN - Stable on current medication  3. HLD -  Continue statin. Check Lipid profile in AM.    For questions or updates, please contact [place] Please consult [email or web address] for contact info under Cardiology/STEMI.   Jarrett Soho, PA  03/20/2017 9:49 AM   I have personally seen and examined this patient with Robbie Lis, PA I agree with the assessment and plan as outlined above. He is known to have CAD with prior stent in ? Vessel in 2010 in [place]. Reports other moderate coronary disease at that time. He is on coumadin for portal vein thrombosis/cirrhosis. Chest pain c/w unstable angina. Troponin is elevated.  My exam:  General: Well developed, well nourished, NAD  HEENT: OP clear, mucus membranes moist  SKIN: warm, dry. No rashes. Neuro: No focal deficits  Musculoskeletal: Muscle strength 5/5 all ext  Psychiatric: Mood and affect normal  Neck: No JVD, no carotid bruits, no thyromegaly, no lymphadenopathy.  Lungs:Clear bilaterally, no wheezes, rhonci, crackles Cardiovascular: Regular rate and rhythm. No murmurs, gallops or rubs. Abdomen:Soft. Bowel sounds present. Non-tender.  Extremities: No lower extremity edema. Pulses are 2 + in the bilateral DP/PT. I have personally reviewed the EKG which shows sinus rhythm. NO ischemic changes.  I have personally reviewed his labs.  Plan: NSTEMI: plan to hold coumadin and start heparin drip when INR below 2. Cardiac cath when INR less than 1.7, likely Monday. He is having no chest pain. Continue home meds.   Lauree Chandler 03/20/2017  10:36 AM

## 2017-03-20 NOTE — H&P (Signed)
History and Physical  Patient Name: Jerry [name]     KXF:[id number]    DOB: 03/17/1951    DOA: 03/19/2017 PCP: Deberah Pelton, MD   Patient coming from: Home     Chief Complaint: Chest pain  HPI: Jerry [name] is a 66 y.o. 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  The patient was in his normal state of health until the past few days he has been helping his fiance move up from [place], and noticed he was much more fatigued than usual with the moving.  Then today he was moving boxes and started to have a burning SSCP and pressure as well as headache. He sat down to the nitroglycerin and pain when away, but then came back so his fiance may then take another nitroglycerin and called EMS. He had nausea was pale and diaphoretic, but en route to the emergency room he was given aspirin 324 and his symptoms resolved.  ED course: -Heart rate 61, respirations pulse ox normal, blood pressure 142/60 -Initial ECG showed sinus bradycardiaand troponin was negative. -Na 137, K 3.9, Cr 0.75, WBC 5.1, Hgb 11.1 -INR 2.95 -Chest x-ray clear -TRH was asked to admit for observation, serial troponins and risk stratification.     He had an episode of chest pain in 2004, for which he had catheterization that showed only 50% LAD lesion, treated medically. He had another episode in 2007, repeat catheterization showed no process, and he was again treated medically. He believes that he had PCI somewhere in 2010 and was told at that time that he had "another 70% blockage", but I have no records of this.  He also had a portal vein thrombosis one year ago that was fairly extensive and is on lifelong anticoagulation. He follows with GI at the [place].  His imaging in our system suggest cirrhosis, but he calls this "liver  sclerosis", and he claims not to know why he is on rifaximin.   Review of Systems:  Review of Systems  Constitutional: Positive for diaphoresis and malaise/fatigue. Negative for chills and fever.  Respiratory: Negative for cough, hemoptysis, sputum production, shortness of breath and wheezing.   Cardiovascular: Positive for chest pain and palpitations. Negative for orthopnea, claudication, leg swelling and PND.  Gastrointestinal: Positive for nausea.  Neurological: Positive for headaches.  All other systems reviewed and are negative.    Past Medical History:  Diagnosis Date  . Diabetes mellitus without complication ([place])   . Hypercholesteremia   . Hypertension   . Primary cancer of kidney or ureter     Past Surgical History:  Procedure Laterality Date  . SHOULDER SURGERY    . STENT PLACEMENT VASCULAR ([place] HX)      Social History: Patient lives alone, soon with his fiance.  Patient walks unassisted. He is not a smoker. 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  Allergies  Allergen Reactions  . Metoprolol Other (See Comments)    bradycardia  . Penicillins Swelling    Swelling at injection site Has patient had a PCN reaction causing immediate rash, facial/tongue/throat swelling, SOB or lightheadedness with hypotension: No Has patient had a PCN reaction causing severe rash involving mucus membranes or  skin necrosis: No Has patient had a PCN reaction that required hospitalization No Has patient had a PCN reaction occurring within the last 10 years: No If all of the above answers are "NO", then may proceed with Cephalosporin use.  . Terazosin Other (See Comments)    Orthostatic hypotension    Family history: family history includes Heart attack in his father and mother; Stroke in his father and mother.  Prior to Admission medications   Medication Sig Start Date End Date Taking?  Authorizing Provider  acetaminophen (TYLENOL) 325 MG tablet Take 975 mg by mouth 3 (three) times daily as needed for mild pain or fever.   Yes [provider]  alprostadil (MUSE) 1000 MCG pellet 1,000 mcg by Transurethral route See admin instructions. Every 96 hours as needed for erection   Yes [provider]  aspirin 81 MG chewable tablet Chew 81 mg by mouth daily.   Yes [provider]  carvedilol (COREG) 6.25 MG tablet Take 6.25 mg by mouth every morning.    Yes [provider]  finasteride (PROSCAR) 5 MG tablet Take 5 mg by mouth daily.   Yes [provider]  gabapentin (NEURONTIN) 300 MG capsule Take 300 mg by mouth 2 (two) times daily.   Yes [provider]  glipiZIDE (GLUCOTROL) 5 MG tablet Take 1 tablet (5 mg total) by mouth 2 (two) times daily before a meal. 01/30/16  Yes Reyne Dumas, MD  isosorbide dinitrate (ISORDIL) 30 MG tablet Take 30-90 mg by mouth See admin instructions. 68m in the morning and 328min the evening   Yes [provider]  lisinopril (PRINIVIL,ZESTRIL) 5 MG tablet Take 2.5 mg by mouth daily.   Yes [provider]  magnesium citrate SOLN Take 296 mLs (1 Bottle total) by mouth once as needed for severe constipation. 01/30/16  Yes AbReyne DumasMD  magnesium oxide (MAG-OX) 400 MG tablet Take 400 mg by mouth every evening.    Yes [provider]  metFORMIN (GLUCOPHAGE) 1000 MG tablet Take 1,000 mg by mouth 2 (two) times daily with a meal.   Yes [provider]  nitroGLYCERIN (NITROSTAT) 0.4 MG SL tablet Place 0.4 mg under the tongue every 5 (five) minutes as needed for chest pain.   Yes [provider]  omeprazole (PRILOSEC) 20 MG capsule Take 20 mg by mouth every evening.   Yes [provider]  rifaximin (XIFAXAN) 550 MG TABS tablet Take 550 mg by mouth 2 (two) times daily.   Yes [provider]  rosuvastatin (CRESTOR) 10 MG tablet Take 10 mg by mouth  daily.    Yes [provider]  senna-docusate (SENOKOT-S) 8.6-50 MG tablet Take 1 tablet by mouth at bedtime as needed for mild constipation. 01/30/16  Yes AbReyne DumasMD  simethicone (MYLICON) 80 MG chewable tablet Chew 160 mg by mouth 2 (two) times daily.    Yes [provider]  warfarin (COUMADIN) 10 MG tablet Take 1 tablet (10 mg total) by mouth one time only at 6 PM. Patient taking differently: Take 10-12.5 mg by mouth See admin instructions. 1068mvery day in evening, except on Thursdays 12.5mg64m26/17 03/19/17 Yes AbroReyne Dumas  enoxaparin (LOVENOX) 150 MG/ML injection Inject 0.59 mLs (90 mg total) into the skin every 12 (twelve) hours. 01/30/16 01/31/16  AbroReyne Dumas       Physical Exam: BP (!) 149/75   Pulse 67   Resp 20   Wt 93 kg (205 lb)  SpO2 94%   BMI 28.59 kg/m  General appearance: Well-developed, adult male, alert and in no acute distress.   Eyes: Anicteric, conjunctiva pink, lids and lashes normal.     ENT: No nasal deformity, discharge, or epistaxis.  OP moist without lesions.   Skin: Warm and dry.   Cardiac: RRR, nl S1-S2, no murmurs appreciated.  Capillary refill is brisk.  JVP normal.  No LE edema.  Radial and DP pulses 2+ and symmetric.  No carotid bruits. Respiratory: Normal respiratory rate and rhythm.  CTAB without rales or wheezes. GI: Abdomen soft without rigidity.  No TTP. No ascites, distension.   MSK: No deformities or effusions.   Pain NOT reproduced with palpation of precordium.  No pain with arm movement. Neuro: Sensorium intact and responding to questions, attention normal.  Speech is fluent. Responses somewhat slowed.  Cranial nerves normal.  Moves all extremities equally and with normal coordination.    Psych: Behavior appropriate.  Affect blunted.  No evidence of aural or visual hallucinations or delusions.       Labs on Admission:  The metabolic panel shows normal left her lites and renal function. The complete blood  count shows mild anemia, no leukocytosis, mild thrombocytopenia. The initial troponin is negative troponins. INR therapeutic.  Radiological Exams on Admission: Personally reviewed chest x-ray shows no focal opacities or edema: Dg Chest Portable 1 View  Result Date: 03/19/2017 CLINICAL DATA:  Intermittent chest pain EXAM: PORTABLE CHEST 1 VIEW COMPARISON:  01/29/2016 FINDINGS: Borderline cardiomegaly, accentuated by portable technique. Mild interstitial crowding at the bases. There is no edema, consolidation, effusion, or pneumothorax. IMPRESSION: No evidence of active disease. Electronically Signed   By: Monte Fantasia M.D.   On: 03/19/2017 19:28    EKG: Independently reviewed. Sinus bradycardia and no change.  Echocardiogram 2017: Report reviewed EF 60-65% Grade 1 DD    Assessment/Plan  1. Chest pain: This is new.  The patient has HEART score of 6. Angina is typical.  Other potential causes of chest pain (PE, dissection, pancreatitis, pneumonia/effusion, pericarditis) are doubted.  We have been asked to admit the patient for observation and etiology consultation with Cardiology tomorrow.  -Serial troponins are ordered -Telemetry -Consult to cardiology, appreciate recommendations -He is already on warfarin, heparin was held -Aspirin given   2. Hypertension and coronary disease secondary prevention: -Continue Imdur, lisinopril, statin  3. History of portal vein thrombosis:  -Continue warfarin  4. Cirrhosis:  -Continue rifaximin  5. Diabetes:  -Hold metformin and glipizide -SSI every 6 hours  6. Other medications:  -Continue finasteride, gabapentin, PPI      DVT prophylaxis: N/A Diet: NPO after 4am for anticipated stress testing Code Status: FULL  Family Communication: Fiance at bedside  Disposition Plan: Anticipate overnight observation for arrhythmia on telemetry, serial troponins and subsequent risk stratification by Cardiology.  If testing negative, home  after. Consults called: Cardiology via inbasket Admission status: Telemetry   Medical decision making: Patient seen at 10:15 PM on 03/19/2017.  The patient was discussed with Dr. Eulis Foster. What exists of the patient's chart was reviewed in depth.  Clinical condition: stable.      Edwin Dada [facility] Pager [phone]

## 2017-03-21 DIAGNOSIS — R079 Chest pain, unspecified: Secondary | ICD-10-CM

## 2017-03-21 LAB — GLUCOSE, CAPILLARY
GLUCOSE-CAPILLARY: 160 mg/dL — AB (ref 65–99)
GLUCOSE-CAPILLARY: 211 mg/dL — AB (ref 65–99)
Glucose-Capillary: 232 mg/dL — ABNORMAL HIGH (ref 65–99)
Glucose-Capillary: 162 mg/dL — ABNORMAL HIGH (ref 65–99)

## 2017-03-21 LAB — CBC
HEMATOCRIT: 37.9 % — AB (ref 39.0–52.0)
HEMOGLOBIN: 12.2 g/dL — AB (ref 13.0–17.0)
MCH: 27.8 pg (ref 26.0–34.0)
MCHC: 32.2 g/dL (ref 30.0–36.0)
MCV: 86.3 fL (ref 78.0–100.0)
Platelets: 120 10*3/uL — ABNORMAL LOW (ref 150–400)
RBC: 4.39 MIL/uL (ref 4.22–5.81)
RDW: 14.6 % (ref 11.5–15.5)
WBC: 5.1 10*3/uL (ref 4.0–10.5)

## 2017-03-21 LAB — COMPREHENSIVE METABOLIC PANEL
ALBUMIN: 3.2 g/dL — AB (ref 3.5–5.0)
ALK PHOS: 72 U/L (ref 38–126)
ALT: 39 U/L (ref 17–63)
ANION GAP: 4 — AB (ref 5–15)
AST: 44 U/L — ABNORMAL HIGH (ref 15–41)
BUN: 11 mg/dL (ref 6–20)
CO2: 27 mmol/L (ref 22–32)
Calcium: 9.1 mg/dL (ref 8.9–10.3)
Chloride: 107 mmol/L (ref 101–111)
Creatinine, Ser: 0.83 mg/dL (ref 0.61–1.24)
Glucose, Bld: 152 mg/dL — ABNORMAL HIGH (ref 65–99)
POTASSIUM: 4.2 mmol/L (ref 3.5–5.1)
SODIUM: 138 mmol/L (ref 135–145)
TOTAL PROTEIN: 6.9 g/dL (ref 6.5–8.1)
Total Bilirubin: 0.8 mg/dL (ref 0.3–1.2)

## 2017-03-21 LAB — PROTIME-INR
INR: 2.21
INR: 2
Prothrombin Time: 24.4 seconds — ABNORMAL HIGH (ref 11.4–15.2)
Prothrombin Time: 22.5 seconds — ABNORMAL HIGH (ref 11.4–15.2)

## 2017-03-21 LAB — LIPID PANEL
Cholesterol: 129 mg/dL (ref 0–200)
HDL: 27 mg/dL — ABNORMAL LOW (ref >40)
LDL Cholesterol: 75 mg/dL (ref 0–99)
Total CHOL/HDL Ratio: 4.8 RATIO
Triglycerides: 134 mg/dL (ref <150)
VLDL: 27 mg/dL (ref 0–40)

## 2017-03-21 MED ORDER — HEPARIN (PORCINE) IN NACL 100-0.45 UNIT/ML-% IJ SOLN
1550.0000 [IU]/h | INTRAMUSCULAR | Status: DC
  Administered 2017-03-22: 1550 [IU]/h via INTRAVENOUS
  Administered 2017-03-22: 1450 [IU]/h via INTRAVENOUS
  Filled 2017-03-21 (×2): qty 250

## 2017-03-21 MED ORDER — INSULIN GLARGINE 100 UNIT/ML ~~LOC~~ SOLN
15.0000 [IU] | Freq: Every day | SUBCUTANEOUS | Status: DC
  Administered 2017-03-21 – 2017-03-23 (×3): 15 [IU] via SUBCUTANEOUS
  Filled 2017-03-21 (×3): qty 0.15

## 2017-03-21 MED ORDER — INSULIN ASPART 100 UNIT/ML ~~LOC~~ SOLN
6.0000 [IU] | Freq: Three times a day (TID) | SUBCUTANEOUS | Status: DC
  Administered 2017-03-21 – 2017-03-22 (×2): 6 [IU] via SUBCUTANEOUS

## 2017-03-21 NOTE — Progress Notes (Signed)
PROGRESS NOTE  Jerry [name]  CZY:[id number]  DOB: December 20, 1950  DOA: 03/19/2017 PCP: Deberah Pelton, MD  Brief Admission Hx: Jerry [name] is a 66 y.o. 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  MDM/Assessment & Plan:   1. Chest pain / Unstable Angina The patient has HEART score of 6. Angina is typical.  Other potential causes of chest pain (PE, dissection, pancreatitis, pneumonia/effusion, pericarditis) are doubted.  Cardiology is planning intervention when the INR is down and planning to start IV heparin infusion.  Planning to recheck INR this evening and possibly start heparin at that time if INR<2.     2. Hypertension and coronary disease secondary prevention: -Continue Imdur, lisinopril, statin  3. History of portal vein thrombosis:  -Holding warfarin per cardiology service and will start heparin infusion.   4. Cirrhosis:  -Continue rifaximin  5. Diabetes:  -Hold metformin and glipizide -SSI ordered as needed  CBG (last 3)   Recent Labs  03/20/17 1628 03/20/17 2111 03/21/17 0742  GLUCAP 183* 177* 160*    6. Other medications:  -Continue finasteride, gabapentin, PPI  DVT prophylaxis: warfarin Code Status: FULL  Family Communication: bedside Disposition Plan: TBD Consults called: Cardiology  Admission status: Telemetry  Subjective: Pt denies any further chest pain or SOB symptoms.     Objective: Vitals:   03/20/17 1950 03/21/17 0007 03/21/17 0422 03/21/17 0900  BP: (!) 144/66 (!) 136/48 130/64 121/68  Pulse: 64 (!) 57 60 66  Resp: 18 16 17 20   Temp: 98.2 F (36.8 C) 98.6 F (37 C) 98.1 F (36.7 C) 98.3 F (36.8 C)  TempSrc: Oral Oral Oral Oral  SpO2: 98% 98% 98% 98%  Weight:   89.7 kg (197 lb 12.8 oz)   Height:        Intake/Output  Summary (Last 24 hours) at 03/21/17 1220 Last data filed at 03/21/17 0600  Gross per 24 hour  Intake              480 ml  Output             1750 ml  Net            -1270 ml   Filed Weights   03/19/17 1736 03/20/17 0815 03/21/17 0422  Weight: 93 kg (205 lb) 90.7 kg (200 lb) 89.7 kg (197 lb 12.8 oz)     REVIEW OF SYSTEMS  As per history otherwise all reviewed and reported negative  Exam:  General exam: chronically ill appearing.  NAD. Cooperative.  Respiratory system: Clear. No increased work of breathing. Cardiovascular system: S1 & S2 heard.   Gastrointestinal system: Abdomen is soft and nontender. Normal bowel sounds heard. Central nervous system: Alert and oriented. No focal neurological deficits. Extremities: no cyanosis seen.   Data Reviewed: Basic Metabolic Panel:  Recent Labs Lab 03/19/17 1940 03/21/17 0628  NA 137 138  K 3.9 4.2  CL 106 107  CO2 23 27  GLUCOSE 133* 152*  BUN 14 11  CREATININE 0.75 0.83  CALCIUM 8.8* 9.1   Liver Function Tests:  Recent Labs Lab 03/21/17 0628  AST 44*  ALT 39  ALKPHOS 72  BILITOT 0.8  PROT 6.9  ALBUMIN 3.2*   No results for input(s): LIPASE, AMYLASE in the last 168 hours. No results for input(s):  AMMONIA in the last 168 hours. CBC:  Recent Labs Lab 03/19/17 1940 03/21/17 0628  WBC 5.1 5.1  HGB 11.1* 12.2*  HCT 34.5* 37.9*  MCV 86.7 86.3  PLT 120* 120*   Cardiac Enzymes:  Recent Labs Lab 03/20/17 0419 03/20/17 1031  TROPONINI 0.08* 0.08*   CBG (last 3)   Recent Labs  03/20/17 1628 03/20/17 2111 03/21/17 0742  GLUCAP 183* 177* 160*   No results found for this or any previous visit (from the past 240 hour(s)).   Studies: Dg Chest Portable 1 View  Result Date: 03/19/2017 CLINICAL DATA:  Intermittent chest pain EXAM: PORTABLE CHEST 1 VIEW COMPARISON:  01/29/2016 FINDINGS: Borderline cardiomegaly, accentuated by portable technique. Mild interstitial crowding at the bases. There is no edema,  consolidation, effusion, or pneumothorax. IMPRESSION: No evidence of active disease. Electronically Signed   By: Monte Fantasia M.D.   On: 03/19/2017 19:28   Scheduled Meds: . aspirin  81 mg Oral Daily  . finasteride  5 mg Oral Daily  . gabapentin  300 mg Oral BID  . insulin aspart  0-15 Units Subcutaneous TID WC  . lisinopril  2.5 mg Oral Daily  . pantoprazole  40 mg Oral Daily  . rifaximin  550 mg Oral BID  . rosuvastatin  10 mg Oral Daily   Continuous Infusions:  Principal Problem:   Chest pain Active Problems:   Portal vein thrombosis   Type 2 diabetes mellitus without complication, without long-term current use of insulin ([facility])   Coronary artery disease due to lipid rich plaque   Essential hypertension   Other cirrhosis of liver ([place])  Time spent:   Irwin Brakeman, MD, FAAFP [facility] Pager [phone]  If 7PM-7AM, please contact night-coverage [email or web address] Password [email or web address] 03/21/2017, 12:20 PM    LOS: 1 day

## 2017-03-21 NOTE — Progress Notes (Addendum)
ANTICOAGULATION CONSULT NOTE  Pharmacy Consult:  Coumadin Indication:  History of VTE  Allergies  Allergen Reactions  . Metoprolol Other (See Comments)    bradycardia  . Penicillins Swelling    Swelling at injection site Has patient had a PCN reaction causing immediate rash, facial/tongue/throat swelling, SOB or lightheadedness with hypotension: No Has patient had a PCN reaction causing severe rash involving mucus membranes or skin necrosis: No Has patient had a PCN reaction that required hospitalization No Has patient had a PCN reaction occurring within the last 10 years: No If all of the above answers are "NO", then may proceed with Cephalosporin use.  . Terazosin Other (See Comments)    Orthostatic hypotension    Patient Measurements: Height: 5' 11"  (180.3 cm) Weight: 197 lb 12.8 oz (89.7 kg) IBW/kg (Calculated) : 75.3  Vital Signs: Temp: 98.3 F (36.8 C) (09/15 0900) Temp Source: Oral (09/15 0900) BP: 121/68 (09/15 0900) Pulse Rate: 66 (09/15 0900)  Labs:  Recent Labs  03/19/17 1940 03/19/17 1947 03/20/17 0419 03/20/17 0610 03/20/17 1031 03/21/17 0628  HGB 11.1*  --   --   --   --  12.2*  HCT 34.5*  --   --   --   --  37.9*  PLT 120*  --   --   --   --  120*  LABPROT  --  30.5*  --  26.8*  --  24.4*  INR  --  2.95  --  2.50  --  2.21  CREATININE 0.75  --   --   --   --  0.83  TROPONINI  --   --  0.08*  --  0.08*  --     Estimated Creatinine Clearance: 92 mL/min (by C-G formula based on SCr of 0.83 mg/dL).  Assessment: 43 YOM presented with chest pain.  Pharmacy consulted to hold warfarin and transition to heparin infusion once INR<2 for possible cardiac cath on Monday per notes. Patient is on warfarin for history of portal vein thrombosis.  No missed doses recently. Last recorded warfarin dose was on 9/14.  Patient's INR is therapeutic today at 2.21. H/H and platelets remain stable. No signs/symptoms of bleeding noted in chart.   Home Coumadin dose: 58m  daily except 12.568mon Thurs  Goal of Therapy:  INR 2-3  Plan:  Continue to hold warfarin  Repeat INR tonight at 20NashuaPharmD Clinical Pharmacist  Phone: [phone] Repeat INR came back at 2. INR is steadily decreasing. Will start heparin infusion tonight at 2200. Will monitor daily heparin levels and CBC.  KiDoylene CanardPharmD Clinical Pharmacist

## 2017-03-21 NOTE — Progress Notes (Signed)
Dr Camillia Herter note reviewed, plans for cath once INR is 1.7 or less. Coumadin is on hold, start heparin once INR <2. Continue medical therapy with ASA, lisinopril, crestor. He has beta blocker allergy. Mild flat troponin. No additional recs today, please call with questions.    Carlyle Dolly MD

## 2017-03-22 ENCOUNTER — Encounter (HOSPITAL_COMMUNITY): Payer: Self-pay | Admitting: *Deleted

## 2017-03-22 LAB — BASIC METABOLIC PANEL
Anion gap: 6 (ref 5–15)
BUN: 12 mg/dL (ref 6–20)
CHLORIDE: 106 mmol/L (ref 101–111)
CO2: 26 mmol/L (ref 22–32)
Calcium: 8.9 mg/dL (ref 8.9–10.3)
Creatinine, Ser: 0.83 mg/dL (ref 0.61–1.24)
GFR calc non Af Amer: >60 mL/min (ref >60)
Glucose, Bld: 171 mg/dL — ABNORMAL HIGH (ref 65–99)
POTASSIUM: 3.7 mmol/L (ref 3.5–5.1)
SODIUM: 138 mmol/L (ref 135–145)

## 2017-03-22 LAB — GLUCOSE, CAPILLARY
GLUCOSE-CAPILLARY: 159 mg/dL — AB (ref 65–99)
GLUCOSE-CAPILLARY: 137 mg/dL — AB (ref 65–99)
Glucose-Capillary: 177 mg/dL — ABNORMAL HIGH (ref 65–99)
Glucose-Capillary: 218 mg/dL — ABNORMAL HIGH (ref 65–99)

## 2017-03-22 LAB — CBC
HCT: 35.8 % — ABNORMAL LOW (ref 39.0–52.0)
HEMOGLOBIN: 11.8 g/dL — AB (ref 13.0–17.0)
MCH: 28.5 pg (ref 26.0–34.0)
MCHC: 33 g/dL (ref 30.0–36.0)
MCV: 86.5 fL (ref 78.0–100.0)
PLATELETS: 129 10*3/uL — AB (ref 150–400)
RBC: 4.14 MIL/uL — AB (ref 4.22–5.81)
RDW: 14.7 % (ref 11.5–15.5)
WBC: 5.4 10*3/uL (ref 4.0–10.5)

## 2017-03-22 LAB — PROTIME-INR
INR: 1.71
PROTHROMBIN TIME: 19.9 s — AB (ref 11.4–15.2)

## 2017-03-22 LAB — HEPARIN LEVEL (UNFRACTIONATED): HEPARIN UNFRACTIONATED: 0.28 [IU]/mL — AB (ref 0.30–0.70)

## 2017-03-22 MED ORDER — INSULIN ASPART 100 UNIT/ML ~~LOC~~ SOLN
5.0000 [IU] | Freq: Three times a day (TID) | SUBCUTANEOUS | Status: DC
  Administered 2017-03-22 – 2017-03-24 (×3): 5 [IU] via SUBCUTANEOUS

## 2017-03-22 NOTE — Progress Notes (Signed)
ANTICOAGULATION CONSULT NOTE  Pharmacy Consult:  Coumadin Indication:  History of VTE  Allergies  Allergen Reactions  . Metoprolol Other (See Comments)    bradycardia  . Penicillins Swelling    Swelling at injection site Has patient had a PCN reaction causing immediate rash, facial/tongue/throat swelling, SOB or lightheadedness with hypotension: No Has patient had a PCN reaction causing severe rash involving mucus membranes or skin necrosis: No Has patient had a PCN reaction that required hospitalization No Has patient had a PCN reaction occurring within the last 10 years: No If all of the above answers are "NO", then may proceed with Cephalosporin use.  . Terazosin Other (See Comments)    Orthostatic hypotension    Patient Measurements: Height: 5' 11"  (180.3 cm) Weight: 197 lb 9.6 oz (89.6 kg) IBW/kg (Calculated) : 75.3  Vital Signs: Temp: 98.5 F (36.9 C) (09/16 0537) Temp Source: Oral (09/16 0537) BP: 120/62 (09/16 0537) Pulse Rate: 62 (09/16 0537)  Labs:  Recent Labs  03/19/17 1940  03/20/17 0419  03/20/17 1031 03/21/17 0628 03/21/17 1935 03/22/17 0445 03/22/17 1144  HGB 11.1*  --   --   --   --  12.2*  --  11.8*  --   HCT 34.5*  --   --   --   --  37.9*  --  35.8*  --   PLT 120*  --   --   --   --  120*  --  129*  --   LABPROT  --   < >  --   < >  --  24.4* 22.5*  --  19.9*  INR  --   < >  --   < >  --  2.21 2.00  --  1.71  HEPARINUNFRC  --   --   --   --   --   --   --   --  0.28*  CREATININE 0.75  --   --   --   --  0.83  --  0.83  --   TROPONINI  --   --  0.08*  --  0.08*  --   --   --   --   < > = values in this interval not displayed.  Estimated Creatinine Clearance: 92 mL/min (by C-G formula based on SCr of 0.83 mg/dL).  Assessment: 11 YOM presented with chest pain.  Pharmacy consulted to hold warfarin and transition to heparin infusion once INR<2 for possible cardiac cath on Monday per notes. Patient is on warfarin for history of portal vein  thrombosis.  No missed doses recently. Last recorded warfarin dose was on 9/14.  Patient's INR is 1.71 today. H/H and platelets remain stable. No signs/symptoms of bleeding noted in chart.   Heparin level came back slightly below goal range at 0.28 today at 1450 units/hr. Infusion was started at 0315 on 9/16.   Home Coumadin dose: 23m daily except 12.523mon Thurs  Goal of Therapy:  INR 2-3  Heparin level 0.3-0.7  Plan:  Continue to hold warfarin  Increase heparin infusion to 1550 units/hr Daily CBC and HL  KiDoylene CanardPharmD Clinical Pharmacist  Phone: [phone]

## 2017-03-22 NOTE — Progress Notes (Signed)
PROGRESS NOTE  Jerry [name]  TWS:[id number]  DOB: August 16, 1950  DOA: 03/19/2017 PCP: Deberah Pelton, MD  Brief Admission Hx: CHRSITOPHER [name] is a 66 y.o. 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  MDM/Assessment & Plan:   1. Chest pain / Unstable Angina The patient has HEART score of 6. Angina is typical.  Other potential causes of chest pain (PE, dissection, pancreatitis, pneumonia/effusion, pericarditis) are doubted.  Cardiology is planning intervention when the INR is down and pharmacy has started the heparin infusion.  2. Hypertension and coronary disease secondary prevention: -Continue Imdur, lisinopril, statin -Blood pressure is controlled and stable.  3. History of portal vein thrombosis:  -Holding warfarin per cardiology service and heparin infusion has been started.   4. Cirrhosis:  -Continue rifaximin  5. Diabetes:  -Hold metformin and glipizide -Started Lantus and prandial NovoLog with close glucose monitoring -SSI ordered as needed  CBG (last 3)   Recent Labs  03/21/17 1221 03/21/17 1621 03/21/17 2105  GLUCAP 211* 232* 162*    6. Other medications:  -Continue finasteride, gabapentin, PPI  DVT prophylaxis: warfarin Code Status: FULL  Family Communication: bedside Disposition Plan: TBD Consults called: Cardiology  Admission status: Telemetry  Subjective: Pt denies any further chest pain or SOB symptoms.     Objective: Vitals:   03/21/17 1300 03/21/17 1929 03/22/17 0315 03/22/17 0537  BP: 134/69 (!) 152/77  120/62  Pulse: 68 71  62  Resp: 20 18  18   Temp: 98.4 F (36.9 C) 98.6 F (37 C)  98.5 F (36.9 C)  TempSrc: Oral Oral  Oral  SpO2: 97% 98%  98%  Weight:   89.6 kg (197 lb 9.6 oz)   Height:        Intake/Output Summary (Last 24  hours) at 03/22/17 0017 Last data filed at 03/22/17 0600  Gross per 24 hour  Intake           959.88 ml  Output             2775 ml  Net         -1815.12 ml   Filed Weights   03/20/17 0815 03/21/17 0422 03/22/17 0315  Weight: 90.7 kg (200 lb) 89.7 kg (197 lb 12.8 oz) 89.6 kg (197 lb 9.6 oz)     REVIEW OF SYSTEMS  As per history otherwise all reviewed and reported negative  Exam:  General exam: chronically ill appearing.  NAD. Cooperative.  Respiratory system: Clear. No increased work of breathing. Cardiovascular system: S1 & S2 heard.   Gastrointestinal system: Abdomen is soft and nontender. Normal bowel sounds heard. Central nervous system: Alert and oriented. No focal neurological deficits. Extremities: no cyanosis seen.   Data Reviewed: Basic Metabolic Panel:  Recent Labs Lab 03/19/17 1940 03/21/17 0628 03/22/17 0445  NA 137 138 138  K 3.9 4.2 3.7  CL 106 107 106  CO2 23 27 26   GLUCOSE 133* 152* 171*  BUN 14 11 12   CREATININE 0.75 0.83 0.83  CALCIUM 8.8* 9.1 8.9   Liver Function Tests:  Recent Labs Lab 03/21/17 0628  AST 44*  ALT 39  ALKPHOS 72  BILITOT 0.8  PROT 6.9  ALBUMIN 3.2*   No results for input(s): LIPASE, AMYLASE in the last 168 hours. No results for input(s): AMMONIA in  the last 168 hours. CBC:  Recent Labs Lab 03/19/17 1940 03/21/17 0628 03/22/17 0445  WBC 5.1 5.1 5.4  HGB 11.1* 12.2* 11.8*  HCT 34.5* 37.9* 35.8*  MCV 86.7 86.3 86.5  PLT 120* 120* 129*   Cardiac Enzymes:  Recent Labs Lab 03/20/17 0419 03/20/17 1031  TROPONINI 0.08* 0.08*   CBG (last 3)   Recent Labs  03/21/17 1221 03/21/17 1621 03/21/17 2105  GLUCAP 211* 232* 162*   No results found for this or any previous visit (from the past 240 hour(s)).   Studies: No results found. Scheduled Meds: . aspirin  81 mg Oral Daily  . finasteride  5 mg Oral Daily  . gabapentin  300 mg Oral BID  . insulin aspart  0-15 Units Subcutaneous TID WC  . insulin  aspart  6 Units Subcutaneous TID WC  . insulin glargine  15 Units Subcutaneous QHS  . lisinopril  2.5 mg Oral Daily  . pantoprazole  40 mg Oral Daily  . rifaximin  550 mg Oral BID  . rosuvastatin  10 mg Oral Daily   Continuous Infusions: . heparin 1,450 Units/hr (03/22/17 0315)    Principal Problem:   Chest pain Active Problems:   Portal vein thrombosis   Type 2 diabetes mellitus without complication, without long-term current use of insulin (HCC)   Coronary artery disease due to lipid rich plaque   Essential hypertension   Other cirrhosis of liver ([place])  Time spent:   Irwin Brakeman, MD, FAAFP [facility] Pager [phone]  If 7PM-7AM, please contact night-coverage [email or web address] Password [email or web address] 03/22/2017, 7:22 AM    LOS: 2 days

## 2017-03-23 ENCOUNTER — Encounter (HOSPITAL_COMMUNITY)
Admission: EM | Disposition: A | Discharge status: Home / Self Care | Payer: Self-pay | Source: Home / Self Care | Attending: Family Medicine

## 2017-03-23 ENCOUNTER — Encounter (HOSPITAL_COMMUNITY): Payer: Self-pay | Admitting: General Practice

## 2017-03-23 DIAGNOSIS — I25110 Atherosclerotic heart disease of native coronary artery with unstable angina pectoris: Principal | ICD-10-CM

## 2017-03-23 DIAGNOSIS — I25119 Atherosclerotic heart disease of native coronary artery with unspecified angina pectoris: Secondary | ICD-10-CM

## 2017-03-23 HISTORY — PX: CORONARY STENT INTERVENTION: CATH118234

## 2017-03-23 HISTORY — PX: LEFT HEART CATH AND CORONARY ANGIOGRAPHY: CATH118249

## 2017-03-23 LAB — PROTIME-INR
INR: 1.48
Prothrombin Time: 17.8 seconds — ABNORMAL HIGH (ref 11.4–15.2)

## 2017-03-23 LAB — BASIC METABOLIC PANEL
Anion gap: 7 (ref 5–15)
BUN: 10 mg/dL (ref 6–20)
CO2: 27 mmol/L (ref 22–32)
CREATININE: 0.86 mg/dL (ref 0.61–1.24)
Calcium: 9.2 mg/dL (ref 8.9–10.3)
Chloride: 104 mmol/L (ref 101–111)
GFR calc Af Amer: >60 mL/min (ref >60)
GLUCOSE: 183 mg/dL — AB (ref 65–99)
POTASSIUM: 3.7 mmol/L (ref 3.5–5.1)
Sodium: 138 mmol/L (ref 135–145)

## 2017-03-23 LAB — GLUCOSE, CAPILLARY
GLUCOSE-CAPILLARY: 174 mg/dL — AB (ref 65–99)
GLUCOSE-CAPILLARY: 168 mg/dL — AB (ref 65–99)
GLUCOSE-CAPILLARY: 170 mg/dL — AB (ref 65–99)
Glucose-Capillary: 140 mg/dL — ABNORMAL HIGH (ref 65–99)
Glucose-Capillary: 170 mg/dL — ABNORMAL HIGH (ref 65–99)

## 2017-03-23 LAB — CBC
HCT: 39.7 % (ref 39.0–52.0)
Hemoglobin: 12.8 g/dL — ABNORMAL LOW (ref 13.0–17.0)
MCH: 28.1 pg (ref 26.0–34.0)
MCHC: 32.2 g/dL (ref 30.0–36.0)
MCV: 87.1 fL (ref 78.0–100.0)
PLATELETS: 137 10*3/uL — AB (ref 150–400)
RBC: 4.56 MIL/uL (ref 4.22–5.81)
RDW: 15 % (ref 11.5–15.5)
WBC: 5.2 10*3/uL (ref 4.0–10.5)

## 2017-03-23 LAB — POCT ACTIVATED CLOTTING TIME: Activated Clotting Time: 472 seconds

## 2017-03-23 LAB — HEPARIN LEVEL (UNFRACTIONATED): Heparin Unfractionated: 0.47 IU/mL (ref 0.30–0.70)

## 2017-03-23 SURGERY — LEFT HEART CATH AND CORONARY ANGIOGRAPHY
Anesthesia: LOCAL

## 2017-03-23 MED ORDER — NITROGLYCERIN 1 MG/10 ML FOR IR/CATH LAB
INTRA_ARTERIAL | Status: DC | PRN
  Administered 2017-03-23: 200 ug via INTRACORONARY

## 2017-03-23 MED ORDER — ACETAMINOPHEN 325 MG PO TABS: 650.0000 mg | ORAL_TABLET | ORAL | Status: DC | PRN

## 2017-03-23 MED ORDER — SODIUM CHLORIDE 0.9 % IV SOLN
INTRAVENOUS | Status: AC
  Administered 2017-03-23: 10:00:00 via INTRAVENOUS

## 2017-03-23 MED ORDER — IOPAMIDOL (ISOVUE-370) INJECTION 76%
INTRAVENOUS | Status: DC | PRN
Start: 2017-03-23 — End: 2017-03-23
  Administered 2017-03-23: 175 mL via INTRAVENOUS

## 2017-03-23 MED ORDER — NITROGLYCERIN 1 MG/10 ML FOR IR/CATH LAB
INTRA_ARTERIAL | Status: AC
  Filled 2017-03-23: qty 10

## 2017-03-23 MED ORDER — BIVALIRUDIN TRIFLUOROACETATE 250 MG IV SOLR
INTRAVENOUS | Status: AC
  Filled 2017-03-23: qty 250

## 2017-03-23 MED ORDER — SODIUM CHLORIDE 0.9 % IV SOLN: 1.7500 mg/kg/h | INTRAVENOUS | Status: AC

## 2017-03-23 MED ORDER — MIDAZOLAM HCL 2 MG/2ML IJ SOLN
INTRAMUSCULAR | Status: AC
  Filled 2017-03-23: qty 2

## 2017-03-23 MED ORDER — HEPARIN (PORCINE) IN NACL 2-0.9 UNIT/ML-% IJ SOLN
INTRAMUSCULAR | Status: AC
  Filled 2017-03-23: qty 1000

## 2017-03-23 MED ORDER — SODIUM CHLORIDE 0.9% FLUSH: 3.0000 mL | Freq: Two times a day (BID) | INTRAVENOUS | Status: DC

## 2017-03-23 MED ORDER — FUROSEMIDE 10 MG/ML IJ SOLN: 80.0000 mg | Freq: Once | INTRAMUSCULAR | Status: DC

## 2017-03-23 MED ORDER — MIDAZOLAM HCL 2 MG/2ML IJ SOLN
INTRAMUSCULAR | Status: DC | PRN
  Administered 2017-03-23: 2 mg via INTRAVENOUS
  Administered 2017-03-23: 1 mg via INTRAVENOUS

## 2017-03-23 MED ORDER — BIVALIRUDIN BOLUS VIA INFUSION - CUPID
INTRAVENOUS | Status: DC | PRN
  Administered 2017-03-23: 66.975 mg via INTRAVENOUS

## 2017-03-23 MED ORDER — FAMOTIDINE IN NACL 20-0.9 MG/50ML-% IV SOLN
INTRAVENOUS | Status: AC
  Filled 2017-03-23: qty 50

## 2017-03-23 MED ORDER — SODIUM CHLORIDE 0.9 % WEIGHT BASED INFUSION
1.0000 mL/kg/h | INTRAVENOUS | Status: DC
  Administered 2017-03-23: 1 mL/kg/h via INTRAVENOUS

## 2017-03-23 MED ORDER — FENTANYL CITRATE (PF) 100 MCG/2ML IJ SOLN
INTRAMUSCULAR | Status: AC
  Filled 2017-03-23: qty 2

## 2017-03-23 MED ORDER — HEPARIN (PORCINE) IN NACL 2-0.9 UNIT/ML-% IJ SOLN
INTRAMUSCULAR | Status: AC | PRN
  Administered 2017-03-23: 1000 mL

## 2017-03-23 MED ORDER — LABETALOL HCL 5 MG/ML IV SOLN: 10.0000 mg | INTRAVENOUS | Status: AC | PRN

## 2017-03-23 MED ORDER — CLOPIDOGREL BISULFATE 75 MG PO TABS
75.0000 mg | ORAL_TABLET | Freq: Every day | ORAL | Status: DC
  Administered 2017-03-24: 09:00:00 75 mg via ORAL
  Filled 2017-03-23: qty 1

## 2017-03-23 MED ORDER — WARFARIN SODIUM 5 MG PO TABS
10.0000 mg | ORAL_TABLET | Freq: Once | ORAL | Status: AC
  Administered 2017-03-23: 10 mg via ORAL
  Filled 2017-03-23: qty 2

## 2017-03-23 MED ORDER — HEPARIN (PORCINE) IN NACL 100-0.45 UNIT/ML-% IJ SOLN: 1550.0000 [IU]/h | INTRAMUSCULAR | Status: DC

## 2017-03-23 MED ORDER — LIDOCAINE HCL (PF) 1 % IJ SOLN
INTRAMUSCULAR | Status: DC | PRN
  Administered 2017-03-23: 15 mL

## 2017-03-23 MED ORDER — ASPIRIN 81 MG PO CHEW
81.0000 mg | CHEWABLE_TABLET | Freq: Every day | ORAL | Status: DC
  Administered 2017-03-24: 81 mg via ORAL
  Filled 2017-03-23 (×2): qty 1

## 2017-03-23 MED ORDER — HYDRALAZINE HCL 20 MG/ML IJ SOLN: 5.0000 mg | INTRAMUSCULAR | Status: AC | PRN

## 2017-03-23 MED ORDER — ASPIRIN 81 MG PO CHEW
81.0000 mg | CHEWABLE_TABLET | ORAL | Status: AC
  Administered 2017-03-23: 81 mg via ORAL

## 2017-03-23 MED ORDER — WARFARIN - PHARMACIST DOSING INPATIENT: Freq: Every day | Status: DC

## 2017-03-23 MED ORDER — HEPARIN (PORCINE) IN NACL 100-0.45 UNIT/ML-% IJ SOLN
1650.0000 [IU]/h | INTRAMUSCULAR | Status: DC
  Administered 2017-03-23: 20:00:00 1550 [IU]/h via INTRAVENOUS
  Filled 2017-03-23: qty 250

## 2017-03-23 MED ORDER — PRAMIPEXOLE DIHYDROCHLORIDE 0.125 MG PO TABS
0.1250 mg | ORAL_TABLET | Freq: Every evening | ORAL | Status: DC | PRN
  Administered 2017-03-23: 15:00:00 0.125 mg via ORAL
  Filled 2017-03-23 (×2): qty 1

## 2017-03-23 MED ORDER — IOPAMIDOL (ISOVUE-370) INJECTION 76%
INTRAVENOUS | Status: AC
  Filled 2017-03-23: qty 100

## 2017-03-23 MED ORDER — FAMOTIDINE IN NACL 20-0.9 MG/50ML-% IV SOLN
INTRAVENOUS | Status: AC | PRN
  Administered 2017-03-23: 20 mg via INTRAVENOUS

## 2017-03-23 MED ORDER — LIDOCAINE HCL (PF) 1 % IJ SOLN
INTRAMUSCULAR | Status: AC
  Filled 2017-03-23: qty 30

## 2017-03-23 MED ORDER — SODIUM CHLORIDE 0.9% FLUSH: 3.0000 mL | INTRAVENOUS | Status: DC | PRN

## 2017-03-23 MED ORDER — FENTANYL CITRATE (PF) 100 MCG/2ML IJ SOLN
INTRAMUSCULAR | Status: DC | PRN
  Administered 2017-03-23 (×2): 25 ug via INTRAVENOUS
  Administered 2017-03-23: 50 ug via INTRAVENOUS

## 2017-03-23 MED ORDER — CLOPIDOGREL BISULFATE 300 MG PO TABS
ORAL_TABLET | ORAL | Status: DC | PRN
  Administered 2017-03-23: 600 mg via ORAL

## 2017-03-23 MED ORDER — SODIUM CHLORIDE 0.9% FLUSH
3.0000 mL | Freq: Two times a day (BID) | INTRAVENOUS | Status: DC
  Administered 2017-03-23: 22:00:00 3 mL via INTRAVENOUS

## 2017-03-23 MED ORDER — ONDANSETRON HCL 4 MG/2ML IJ SOLN: 4.0000 mg | Freq: Four times a day (QID) | INTRAMUSCULAR | Status: DC | PRN

## 2017-03-23 MED ORDER — SODIUM CHLORIDE 0.9 % IV SOLN: 250.0000 mL | INTRAVENOUS | Status: DC | PRN

## 2017-03-23 MED ORDER — CLOPIDOGREL BISULFATE 300 MG PO TABS
ORAL_TABLET | ORAL | Status: AC
  Filled 2017-03-23: qty 2

## 2017-03-23 MED ORDER — SODIUM CHLORIDE 0.9 % IV SOLN
INTRAVENOUS | Status: AC | PRN
  Administered 2017-03-23 (×2): 1.75 mg/kg/h via INTRAVENOUS

## 2017-03-23 SURGICAL SUPPLY — 19 items
BALLN SAPPHIRE 2.5X20 (BALLOONS) ×2
BALLN SAPPHIRE ~~LOC~~ 3.25X12 (BALLOONS) ×1 IMPLANT
BALLOON SAPPHIRE 2.5X20 (BALLOONS) IMPLANT
CATH INFINITI MULTIPACK ST 5F (CATHETERS) ×1 IMPLANT
CATH LAUNCHER 6FR EBU3.5 (CATHETERS) ×1 IMPLANT
COVER PRB 48X5XTLSCP FOLD TPE (BAG) IMPLANT
COVER PROBE 5X48 (BAG) ×2
KIT ENCORE 26 ADVANTAGE (KITS) ×1 IMPLANT
KIT HEART LEFT (KITS) ×2 IMPLANT
PACK CARDIAC CATHETERIZATION (CUSTOM PROCEDURE TRAY) ×2 IMPLANT
SHEATH PINNACLE 5F 10CM (SHEATH) ×1 IMPLANT
SHEATH PINNACLE 6F 10CM (SHEATH) ×1 IMPLANT
STENT SYNERGY DES 2.75X28 (Permanent Stent) ×1 IMPLANT
SYR MEDRAD MARK V 150ML (SYRINGE) ×2 IMPLANT
TRANSDUCER W/STOPCOCK (MISCELLANEOUS) ×2 IMPLANT
TUBING CIL FLEX 10 FLL-RA (TUBING) ×2 IMPLANT
VALVE GUARDIAN II ~~LOC~~ HEMO (MISCELLANEOUS) ×1 IMPLANT
WIRE ASAHI PROWATER 180CM (WIRE) ×1 IMPLANT
WIRE EMERALD 3MM-J .035X150CM (WIRE) ×1 IMPLANT

## 2017-03-23 NOTE — Interval H&P Note (Signed)
Cath Lab Visit (complete for each Cath Lab visit)  Clinical Evaluation Leading to the Procedure:   ACS: Yes.    Non-ACS:    Anginal Classification: CCS IV  Anti-ischemic medical therapy: Minimal Therapy (1 class of medications)  Non-Invasive Test Results: No non-invasive testing performed  Prior CABG: No previous CABG      History and Physical Interval Note:  03/23/2017 7:33 AM  Jerry [name]  has presented today for surgery, with the diagnosis of unstable angina  The various methods of treatment have been discussed with the patient and family. After consideration of risks, benefits and other options for treatment, the patient has consented to  Procedure(s): LEFT HEART CATH AND CORONARY ANGIOGRAPHY (N/A) as a surgical intervention .  The patient's history has been reviewed, patient examined, no change in status, stable for surgery.  I have reviewed the patient's chart and labs.  Questions were answered to the patient's satisfaction.     Larae Grooms

## 2017-03-23 NOTE — Progress Notes (Signed)
ANTICOAGULATION CONSULT NOTE  Pharmacy Consult:  heparin Indication:  History of VTE  Allergies  Allergen Reactions  . Metoprolol Other (See Comments)    bradycardia  . Penicillins Swelling    Swelling at injection site Has patient had a PCN reaction causing immediate rash, facial/tongue/throat swelling, SOB or lightheadedness with hypotension: No Has patient had a PCN reaction causing severe rash involving mucus membranes or skin necrosis: No Has patient had a PCN reaction that required hospitalization No Has patient had a PCN reaction occurring within the last 10 years: No If all of the above answers are "NO", then may proceed with Cephalosporin use.  . Terazosin Other (See Comments)    Orthostatic hypotension    Patient Measurements: Height: 5' 11"  (180.3 cm) Weight: 196 lb 12.8 oz (89.3 kg) IBW/kg (Calculated) : 75.3  Vital Signs: Temp: 97.6 F (36.4 C) (09/17 1000) Temp Source: Oral (09/17 1000) BP: 123/59 (09/17 1000) Pulse Rate: 58 (09/17 1000)  Labs:  Recent Labs  03/21/17 0628 03/21/17 1935 03/22/17 0445 03/22/17 1144 03/23/17 0402  HGB 12.2*  --  11.8*  --  12.8*  HCT 37.9*  --  35.8*  --  39.7  PLT 120*  --  129*  --  137*  LABPROT 24.4* 22.5*  --  19.9* 17.8*  INR 2.21 2.00  --  1.71 1.48  HEPARINUNFRC  --   --   --  0.28* 0.47  CREATININE 0.83  --  0.83  --  0.86    Estimated Creatinine Clearance: 88.8 mL/min (by C-G formula based on SCr of 0.86 mg/dL).  Assessment: 66 year old male presented with chest pain. Patient is on warfarin for history of portal vein thrombosis. Last recorded warfarin dose was on 9/14. Pt had a cardiac cath today which required a DES to the mid LAD. Pt will be on asa, plavix and warfarin for 30 days then will just be on warfarin/plavix for at least 6 months. Warfarin will be resumed tonight and will be bridged with heparin. The sheath was pulled around 1400 today.   Home warfarin dose: 75m daily except 12.552mon  Thurs  Goal of Therapy:  INR 2-3  Heparin level 0.3-0.7  Plan:  -Resume heparin at 1550 units/hr at 2000 -First level with am labs -Daily HL, CBC, INR -Warfarin 10 mg po x1 -Discontinue heparin when INR therapeutic    [place] BetterPharmD, BCPS Clinical Pharmacist 03/23/2017 10:48 AM

## 2017-03-23 NOTE — Progress Notes (Signed)
Site area: right groin  Site Prior to Removal:  Level 0  Pressure Applied For 20  MINUTES    Minutes Beginning at 1400  Manual:   Yes.    Patient Status During Pull:  stable  Post Pull Groin Site:  Level 0  Post Pull Instructions Given:  Yes.    Post Pull Pulses Present:  Yes.    Dressing Applied:  Yes.    Comments:

## 2017-03-23 NOTE — Care Management Note (Signed)
Case Management Note  Patient Details  Name: Jerry [name] MRN: [id number] Date of Birth: December 10, 1950  Subjective/Objective:    From home alone, pta indep,  S/p coronary stent intervention, will be on plavix and asa.                  Action/Plan: NCM will follow for dc needs.   Expected Discharge Date:  03/22/17               Expected Discharge Plan:  Home/Self Care  In-House Referral:     Discharge planning Services  CM Consult  Post Acute Care Choice:    Choice offered to:     DME Arranged:    DME Agency:     HH Arranged:    HH Agency:     Status of Service:  Completed, signed off  If discussed at [employer] of Stay Meetings, dates discussed:    Additional Comments:  Zenon Mayo, RN 03/23/2017, 12:48 PM

## 2017-03-23 NOTE — Progress Notes (Signed)
PROGRESS NOTE  Jerry [name]  UTM:[id number]  DOB: [birth year]  DOA: 03/19/2017 PCP: Deberah Pelton, MD  Brief Admission Hx: Jerry [name] is a 66 y.o. 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  MDM/Assessment & Plan:   1. Chest pain / Unstable Angina / CAD The patient has HEART score of 6. Angina is typical.  Other potential causes of chest pain (PE, dissection, pancreatitis, pneumonia/effusion, pericarditis) are doubted.  Cardiology is planning intervention when the INR is down and planning to start IV heparin infusion.  Planning to recheck INR this evening and possibly start heparin at that time if INR<2.   Pt had a cardiac cath today and stent was placed. Per cardiology can restart warfarin this evening.  Continue plavix and aspirin for 1 month and continue warfarin and plavix for at least 6 months.  Stop aspirin after 30 days.  Cath results:   Patent circumflex stent.  Prox LAD lesion, 30 %stenosed.  Mid RCA lesion, 25 %stenosed.  Ost RCA to Prox RCA lesion, 20 %stenosed.  The left ventricular systolic function is normal.  LV end diastolic pressure is normal.  The left ventricular ejection fraction is 55-65% by visual estimate.  There is no aortic valve stenosis.  Mid LAD lesion, 75 %stenosed.  A STENT SYNERGY DES 2.75X28 drug eluting stent was successfully placed, postdilated to 3.25 mm.  Post intervention, there is a 0% residual stenosis.  2. Hypertension and coronary disease secondary prevention: -Continue Imdur, lisinopril, statin  3. History of portal vein thrombosis:  -can restart warfarin this evening per cardiology service.    4. Cirrhosis:  -Continue rifaximin  5. Diabetes:  -Hold metformin and glipizide -SSI ordered as needed  CBG (last 3)    Recent Labs  03/23/17 0358 03/23/17 0647 03/23/17 1132  GLUCAP 174* 168* 140*    6. Other medications:  -Continue finasteride, gabapentin, PPI  7.  RLS - trial of mirapex prn RLS symptoms  DVT prophylaxis: warfarin Code Status: FULL  Family Communication: bedside Disposition Plan: TBD Consults called: Cardiology  Admission status: Telemetry  Subjective: Pt seen immediately after cath, resting flat in bed  Objective: Vitals:   03/23/17 1030 03/23/17 1100 03/23/17 1130 03/23/17 1200  BP: (!) 114/57 (!) 133/59 133/65 125/64  Pulse: (!) 57 65 (!) 56 (!) 57  Resp: 14 17 14 15   Temp:  (!) 97.5 F (36.4 C)    TempSrc:  Oral    SpO2: 97% 98% 98% 99%  Weight:      Height:        Intake/Output Summary (Last 24 hours) at 03/23/17 1321 Last data filed at 03/23/17 1119  Gross per 24 hour  Intake           361.48 ml  Output             1850 ml  Net         -1488.52 ml   Filed Weights   03/21/17 0422 03/22/17 0315 03/23/17 0602  Weight: 89.7 kg (197 lb 12.8 oz) 89.6 kg (197 lb 9.6 oz) 89.3 kg (196 lb 12.8 oz)     REVIEW OF SYSTEMS  As per history otherwise all reviewed and reported negative  Exam:  General exam: chronically ill appearing.  NAD. Cooperative.  Respiratory system: Clear. No increased  work of breathing. Cardiovascular system: S1 & S2 heard.   Gastrointestinal system: Abdomen is soft and nontender. Normal bowel sounds heard. Central nervous system: Alert and oriented. No focal neurological deficits. Extremities: no cyanosis seen.   Data Reviewed: Basic Metabolic Panel:  Recent Labs Lab 03/19/17 1940 03/21/17 0628 03/22/17 0445 03/23/17 0402  NA 137 138 138 138  K 3.9 4.2 3.7 3.7  CL 106 107 106 104  CO2 23 27 26 27   GLUCOSE 133* 152* 171* 183*  BUN 14 11 12 10   CREATININE 0.75 0.83 0.83 0.86  CALCIUM 8.8* 9.1 8.9 9.2   Liver Function Tests:  Recent Labs Lab 03/21/17 0628  AST 44*  ALT 39  ALKPHOS 72  BILITOT 0.8  PROT 6.9   ALBUMIN 3.2*   No results for input(s): LIPASE, AMYLASE in the last 168 hours. No results for input(s): AMMONIA in the last 168 hours. CBC:  Recent Labs Lab 03/19/17 1940 03/21/17 0628 03/22/17 0445 03/23/17 0402  WBC 5.1 5.1 5.4 5.2  HGB 11.1* 12.2* 11.8* 12.8*  HCT 34.5* 37.9* 35.8* 39.7  MCV 86.7 86.3 86.5 87.1  PLT 120* 120* 129* 137*   Cardiac Enzymes:  Recent Labs Lab 03/20/17 0419 03/20/17 1031  TROPONINI 0.08* 0.08*   CBG (last 3)   Recent Labs  03/23/17 0358 03/23/17 0647 03/23/17 1132  GLUCAP 174* 168* 140*   No results found for this or any previous visit (from the past 240 hour(s)).   Studies: No results found. Scheduled Meds: . aspirin  81 mg Oral Daily  . [START ON 03/24/2017] clopidogrel  75 mg Oral Q breakfast  . finasteride  5 mg Oral Daily  . gabapentin  300 mg Oral BID  . insulin aspart  0-15 Units Subcutaneous TID WC  . insulin aspart  5 Units Subcutaneous TID WC  . insulin glargine  15 Units Subcutaneous QHS  . lisinopril  2.5 mg Oral Daily  . pantoprazole  40 mg Oral Daily  . rifaximin  550 mg Oral BID  . rosuvastatin  10 mg Oral Daily  . sodium chloride flush  3 mL Intravenous Q12H   Continuous Infusions: . sodium chloride 100 mL/hr at 03/23/17 0930  . sodium chloride      Principal Problem:   Chest pain Active Problems:   Portal vein thrombosis   Type 2 diabetes mellitus without complication, without long-term current use of insulin (HCC)   Coronary artery disease due to lipid rich plaque   Essential hypertension   Other cirrhosis of liver (HCC)   Coronary artery disease involving native coronary artery of native heart with unstable angina pectoris ([place])  Time spent:   Irwin Brakeman, MD, FAAFP [facility] Pager [phone]  If 7PM-7AM, please contact night-coverage [email or web address] Password [email or web address] 03/23/2017, 1:21 PM    LOS: 3 days

## 2017-03-23 NOTE — H&P (View-Only) (Signed)
Dr Camillia Herter note reviewed, plans for cath once INR is 1.7 or less. Coumadin is on hold, start heparin once INR <2. Continue medical therapy with ASA, lisinopril, crestor. He has beta blocker allergy. Mild flat troponin. No additional recs today, please call with questions.    Carlyle Dolly MD

## 2017-03-24 LAB — CBC
HEMATOCRIT: 34.3 % — AB (ref 39.0–52.0)
HEMOGLOBIN: 11.3 g/dL — AB (ref 13.0–17.0)
MCH: 28.4 pg (ref 26.0–34.0)
MCHC: 32.9 g/dL (ref 30.0–36.0)
MCV: 86.2 fL (ref 78.0–100.0)
Platelets: 119 10*3/uL — ABNORMAL LOW (ref 150–400)
RBC: 3.98 MIL/uL — ABNORMAL LOW (ref 4.22–5.81)
RDW: 15 % (ref 11.5–15.5)
WBC: 5.9 10*3/uL (ref 4.0–10.5)

## 2017-03-24 LAB — BASIC METABOLIC PANEL
ANION GAP: 5 (ref 5–15)
BUN: 10 mg/dL (ref 6–20)
CO2: 26 mmol/L (ref 22–32)
Calcium: 9 mg/dL (ref 8.9–10.3)
Chloride: 106 mmol/L (ref 101–111)
Creatinine, Ser: 0.92 mg/dL (ref 0.61–1.24)
GLUCOSE: 166 mg/dL — AB (ref 65–99)
POTASSIUM: 3.7 mmol/L (ref 3.5–5.1)
Sodium: 137 mmol/L (ref 135–145)

## 2017-03-24 LAB — GLUCOSE, CAPILLARY
GLUCOSE-CAPILLARY: 251 mg/dL — AB (ref 65–99)
Glucose-Capillary: 153 mg/dL — ABNORMAL HIGH (ref 65–99)

## 2017-03-24 LAB — HEPARIN LEVEL (UNFRACTIONATED): HEPARIN UNFRACTIONATED: 0.27 [IU]/mL — AB (ref 0.30–0.70)

## 2017-03-24 LAB — PROTIME-INR
INR: 1.44
Prothrombin Time: 17.4 seconds — ABNORMAL HIGH (ref 11.4–15.2)

## 2017-03-24 MED ORDER — WARFARIN SODIUM 10 MG PO TABS: 10.0000 mg | ORAL_TABLET | ORAL | Status: DC

## 2017-03-24 MED ORDER — WARFARIN SODIUM 7.5 MG PO TABS: 15.0000 mg | ORAL_TABLET | Freq: Once | ORAL | Status: DC

## 2017-03-24 MED ORDER — CLOPIDOGREL BISULFATE 75 MG PO TABS: 75.0000 mg | ORAL_TABLET | Freq: Every day | ORAL | 1 refills | Status: AC

## 2017-03-24 MED ORDER — ROSUVASTATIN CALCIUM 20 MG PO TABS: 20.0000 mg | ORAL_TABLET | Freq: Every day | ORAL | 0 refills | Status: DC

## 2017-03-24 MED ORDER — WARFARIN SODIUM 5 MG PO TABS: 10.0000 mg | ORAL_TABLET | Freq: Once | ORAL | Status: DC

## 2017-03-24 MED ORDER — PANTOPRAZOLE SODIUM 40 MG PO TBEC: 40.0000 mg | DELAYED_RELEASE_TABLET | Freq: Every day | ORAL | 0 refills | Status: DC

## 2017-03-24 NOTE — Discharge Summary (Addendum)
Physician Discharge Summary  COE ANGELOS NGE:[id number] DOB: August 08, 1950 DOA: 03/19/2017  PCP: Deberah Pelton, MD  Admit date: 03/19/2017 Discharge date: 03/24/2017  Admitted From: Home  Disposition: Home   Recommendations for Outpatient Follow-up:  1. Follow up with PCP in 1 weeks 2. Follow up with cardiologist at [facility] in 2 weeks 3. Please obtain BMP/CBC in one week 4. Take aspirin with plavix for 1 month then stop Aspirin.  Take plavix for 1 year  Discharge Condition: STABLE   CODE STATUS: FULL    Brief Hospitalization Summary: Please see all hospital notes, images, labs for full details of the hospitalization. HPI: Jerry [name] is a 66 y.o. 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  The patient was in his normal state of health until the past few days he has been helping his fiance move up from [place], and noticed he was much more fatigued than usual with the moving.  Then today he was moving boxes and started to have a burning SSCP and pressure as well as headache. He sat down to the nitroglycerin and pain when away, but then came back so his fiance may then take another nitroglycerin and called EMS. He had nausea was pale and diaphoretic, but en route to the emergency room he was given aspirin 324 and his symptoms resolved.  ED course: -Heart rate 61, respirations pulse ox normal, blood pressure 142/60 -Initial ECG showed sinus bradycardiaand troponin was negative. -Na 137, K 3.9, Cr 0.75, WBC 5.1, Hgb 11.1 -INR 2.95 -Chest x-ray clear -TRH was asked to admit for observation, serial troponins and risk stratification.  Brief Admission Hx: Jerry Chacko McGowanis a 66 y.o.malewith a past medical history significant for CAD s/p PCI in 2010 at the [place], cirrhosis cryptogenic,  with HE and portal vein thrombosis on warfarin, NIDDM, HTN renal cancer s/p partial L nephrectomy in 2001who presents with chest pain.  MDM/Assessment & Plan:   1. Chest pain / Unstable Angina / CAD The patient has HEART score of 6. Angina is typical. Other potential causes of chest pain (PE, dissection, pancreatitis, pneumonia/effusion, pericarditis) are doubted.  Cardiology is planning intervention when the INR is down and planning to start IV heparin infusion.  Planning to recheck INR this evening and possibly start heparin at that time if INR<2.   Pt had a cardiac cath today and stent was placed. Per cardiology can restart warfarin this evening.  Continue plavix and aspirin for 1 month and continue warfarin and plavix for at least 6 months.  Stop aspirin after 30 days.  Crestor increased to 20 mg daily.  Cath results:   Patent circumflex stent.  Prox LAD lesion, 30 %stenosed.  Mid RCA lesion, 25 %stenosed.  Ost RCA to Prox RCA lesion, 20 %stenosed.  The left ventricular systolic function is normal.  LV end diastolic pressure is normal.  The left ventricular ejection fraction is 55-65% by visual estimate.  There is no aortic valve stenosis.  Mid LAD lesion, 75 %stenosed.  A STENT SYNERGY DES [id number] drug eluting stent was successfully placed, postdilated to 3.25 mm.  Post intervention, there is a 0% residual stenosis.  2. Hypertension and coronary disease secondary prevention: -Continue Imdur, lisinopril, statin  3. History of portal vein thrombosis: -restart warfarin per cardiology service without bridging.  4. Cirrhosis: -Continue rifaximin  5. Diabetes: -resume home metformin and glipizide  CBG (last 3)   Recent Labs (last 2 labs)    Recent Labs  03/23/17 0358 03/23/17 0647 03/23/17 1132  GLUCAP 174* 168* 140*      6. Other medications: -Continue finasteride, gabapentin, PPI  7.  RLS - follow up with PCP at [facility]  DVT  prophylaxis:warfarin Code Status:FULL Family Communication:bedside Disposition Plan:TBD Consults called:Cardiology  Admission status:Telemetry  Discharge Diagnoses:  Principal Problem:   Chest pain Active Problems:   Portal vein thrombosis   Type 2 diabetes mellitus without complication, without long-term current use of insulin (HCC)   Coronary artery disease due to lipid rich plaque   Essential hypertension   Other cirrhosis of liver (HCC)   Coronary artery disease involving native coronary artery of native heart with unstable angina pectoris [facility])   Discharge Instructions: Discharge Instructions    Call MD for:  difficulty breathing, headache or visual disturbances    Complete by:  As directed    Call MD for:  extreme fatigue    Complete by:  As directed    Call MD for:  hives    Complete by:  As directed    Call MD for:  persistant dizziness or light-headedness    Complete by:  As directed    Call MD for:  persistant nausea and vomiting    Complete by:  As directed    Call MD for:  redness, tenderness, or signs of infection (pain, swelling, redness, odor or green/yellow discharge around incision site)    Complete by:  As directed    Call MD for:  severe uncontrolled pain    Complete by:  As directed    Call MD for:  temperature >100.4    Complete by:  As directed    Increase activity slowly    Complete by:  As directed      Allergies as of 03/24/2017      Reactions   Metoprolol Other (See Comments)   bradycardia   Penicillins Swelling   Swelling at injection site Has patient had a PCN reaction causing immediate rash, facial/tongue/throat swelling, SOB or lightheadedness with hypotension: No Has patient had a PCN reaction causing severe rash involving mucus membranes or skin necrosis: No Has patient had a PCN reaction that required hospitalization No Has patient had a PCN reaction occurring within the last 10 years: No If all of the above answers are "NO",  then may proceed with Cephalosporin use.   Terazosin Other (See Comments)   Orthostatic hypotension      Medication List    STOP taking these medications   carvedilol 6.25 MG tablet Commonly known as:  COREG   enoxaparin 150 MG/ML injection Commonly known as:  LOVENOX   omeprazole 20 MG capsule Commonly known as:  PRILOSEC     TAKE these medications   acetaminophen 325 MG tablet Commonly known as:  TYLENOL Take 975 mg by mouth 3 (three) times daily as needed for mild pain or fever.   alprostadil 1000 MCG pellet Commonly known as:  MUSE 1,000 mcg by Transurethral route See admin instructions. Every 96 hours as needed for erection   aspirin 81 MG chewable tablet Chew 81 mg by mouth daily.   clopidogrel 75 MG tablet Commonly known as:  PLAVIX Take 1 tablet (75 mg total) by mouth daily with breakfast.   finasteride 5 MG tablet Commonly known as:  PROSCAR Take 5 mg by mouth daily.  gabapentin 300 MG capsule Commonly known as:  NEURONTIN Take 300 mg by mouth 2 (two) times daily.   glipiZIDE 5 MG tablet Commonly known as:  GLUCOTROL Take 1 tablet (5 mg total) by mouth 2 (two) times daily before a meal.   isosorbide dinitrate 30 MG tablet Commonly known as:  ISORDIL Take 30-90 mg by mouth See admin instructions. 79m in the morning and 340min the evening   lisinopril 5 MG tablet Commonly known as:  PRINIVIL,ZESTRIL Take 2.5 mg by mouth daily.   magnesium citrate Soln Take 296 mLs (1 Bottle total) by mouth once as needed for severe constipation.   magnesium oxide 400 MG tablet Commonly known as:  MAG-OX Take 400 mg by mouth every evening.   metFORMIN 1000 MG tablet Commonly known as:  GLUCOPHAGE Take 1,000 mg by mouth 2 (two) times daily with a meal.   nitroGLYCERIN 0.4 MG SL tablet Commonly known as:  NITROSTAT Place 0.4 mg under the tongue every 5 (five) minutes as needed for chest pain.   pantoprazole 40 MG tablet Commonly known as:  PROTONIX Take 1  tablet (40 mg total) by mouth daily.   rifaximin 550 MG Tabs tablet Commonly known as:  XIFAXAN Take 550 mg by mouth 2 (two) times daily.   rosuvastatin 20 MG tablet Commonly known as:  CRESTOR Take 1 tablet (20 mg total) by mouth daily. What changed:  medication strength  how much to take   senna-docusate 8.6-50 MG tablet Commonly known as:  Senokot-S Take 1 tablet by mouth at bedtime as needed for mild constipation.   simethicone 80 MG chewable tablet Commonly known as:  MYLICON Chew 16416g by mouth 2 (two) times daily.   warfarin 10 MG tablet Commonly known as:  COUMADIN Take 1-1.5 tablets (10-15 mg total) by mouth See admin instructions. 1032mvery day in evening, except on Thursdays 12.5mg56mat changed:  how much to take  when to take this  additional instructions            Discharge Care Instructions        Start     Ordered   03/25/17 0000  clopidogrel (PLAVIX) 75 MG tablet  Daily with breakfast     03/24/17 1037   03/24/17 0000  rosuvastatin (CRESTOR) 20 MG tablet  Daily     03/24/17 1037   03/24/17 0000  warfarin (COUMADIN) 10 MG tablet  See admin instructions     03/24/17 1037   03/24/17 0000  Increase activity slowly     03/24/17 1037   03/24/17 0000  Call MD for:  temperature >100.4     03/24/17 1037   03/24/17 0000  Call MD for:  persistant nausea and vomiting     03/24/17 1037   03/24/17 0000  Call MD for:  severe uncontrolled pain     03/24/17 1037   03/24/17 0000  Call MD for:  redness, tenderness, or signs of infection (pain, swelling, redness, odor or green/yellow discharge around incision site)     03/24/17 1037   03/24/17 0000  Call MD for:  difficulty breathing, headache or visual disturbances     03/24/17 1037   03/24/17 0000  Call MD for:  persistant dizziness or light-headedness     03/24/17 1037   03/24/17 0000  Call MD for:  hives     03/24/17 1037   03/24/17 0000  Call MD for:  extreme fatigue     03/24/17 1037   03/24/17  0000  pantoprazole (PROTONIX) 40 MG tablet  Daily     03/24/17 1059     Follow-up Information    Deberah Pelton, MD. Schedule an appointment as soon as possible for a visit in 1 week(s).   Specialty:  Internal Medicine Why:  Hospital Follow Up  Contact information: [address] [place] [address] [phone]        Cardiologist at [facility]. Schedule an appointment as soon as possible for a visit in 2 week(s).   Why:  Hospital Follow Up          Allergies  Allergen Reactions  . Metoprolol Other (See Comments)    bradycardia  . Penicillins Swelling    Swelling at injection site Has patient had a PCN reaction causing immediate rash, facial/tongue/throat swelling, SOB or lightheadedness with hypotension: No Has patient had a PCN reaction causing severe rash involving mucus membranes or skin necrosis: No Has patient had a PCN reaction that required hospitalization No Has patient had a PCN reaction occurring within the last 10 years: No If all of the above answers are "NO", then may proceed with Cephalosporin use.  . Terazosin Other (See Comments)    Orthostatic hypotension   Current Discharge Medication List    START taking these medications   Details  clopidogrel (PLAVIX) 75 MG tablet Take 1 tablet (75 mg total) by mouth daily with breakfast. Qty: 30 tablet, Refills: 1    pantoprazole (PROTONIX) 40 MG tablet Take 1 tablet (40 mg total) by mouth daily. Qty: 30 tablet, Refills: 0      CONTINUE these medications which have CHANGED   Details  rosuvastatin (CRESTOR) 20 MG tablet Take 1 tablet (20 mg total) by mouth daily. Qty: 30 tablet, Refills: 0    warfarin (COUMADIN) 10 MG tablet Take 1-1.5 tablets (10-15 mg total) by mouth See admin instructions. 35m every day in evening, except on Thursdays 12.581m     CONTINUE these medications which have NOT CHANGED   Details  acetaminophen (TYLENOL) 325 MG tablet Take 975 mg by mouth 3 (three) times daily as needed for  mild pain or fever.    alprostadil (MUSE) 1000 MCG pellet 1,000 mcg by Transurethral route See admin instructions. Every 96 hours as needed for erection    aspirin 81 MG chewable tablet Chew 81 mg by mouth daily.    finasteride (PROSCAR) 5 MG tablet Take 5 mg by mouth daily.    gabapentin (NEURONTIN) 300 MG capsule Take 300 mg by mouth 2 (two) times daily.    glipiZIDE (GLUCOTROL) 5 MG tablet Take 1 tablet (5 mg total) by mouth 2 (two) times daily before a meal. Qty: 60 tablet, Refills: 1    isosorbide dinitrate (ISORDIL) 30 MG tablet Take 30-90 mg by mouth See admin instructions. 9035mn the morning and 25m50m the evening    lisinopril (PRINIVIL,ZESTRIL) 5 MG tablet Take 2.5 mg by mouth daily.    magnesium citrate SOLN Take 296 mLs (1 Bottle total) by mouth once as needed for severe constipation. Qty: 195 mL, Refills: 1    magnesium oxide (MAG-OX) 400 MG tablet Take 400 mg by mouth every evening.     metFORMIN (GLUCOPHAGE) 1000 MG tablet Take 1,000 mg by mouth 2 (two) times daily with a meal.    nitroGLYCERIN (NITROSTAT) 0.4 MG SL tablet Place 0.4 mg under the tongue every 5 (five) minutes as needed for chest pain.    rifaximin (XIFAXAN) 550 MG TABS tablet Take 550  mg by mouth 2 (two) times daily.    senna-docusate (SENOKOT-S) 8.6-50 MG tablet Take 1 tablet by mouth at bedtime as needed for mild constipation. Qty: 60 tablet, Refills: 1    simethicone (MYLICON) 80 MG chewable tablet Chew 160 mg by mouth 2 (two) times daily.       STOP taking these medications     carvedilol (COREG) 6.25 MG tablet      omeprazole (PRILOSEC) 20 MG capsule      enoxaparin (LOVENOX) 150 MG/ML injection        Procedures/Studies: Dg Chest Portable 1 View  Result Date: 03/19/2017 CLINICAL DATA:  Intermittent chest pain EXAM: PORTABLE CHEST 1 VIEW COMPARISON:  01/29/2016 FINDINGS: Borderline cardiomegaly, accentuated by portable technique. Mild interstitial crowding at the bases. There is no  edema, consolidation, effusion, or pneumothorax. IMPRESSION: No evidence of active disease. Electronically Signed   By: Monte Fantasia M.D.   On: 03/19/2017 19:28     Subjective: Pt says he feels much better and ready to go home.  Cardiology says he can go home today.   Discharge Exam: Vitals:   03/23/17 2047 03/24/17 0718  BP: 135/70 132/70  Pulse: 68 64  Resp: 20 17  Temp: 98.7 F (37.1 C) 98.4 F (36.9 C)  SpO2: 97% 98%   Vitals:   03/23/17 1630 03/23/17 1700 03/23/17 2047 03/24/17 0718  BP: 133/68 125/70 135/70 132/70  Pulse: 65 67 68 64  Resp: 18 20 20 17   Temp:   98.7 F (37.1 C) 98.4 F (36.9 C)  TempSrc:   Oral Oral  SpO2: 98% 97% 97% 98%  Weight:    87 kg (191 lb 12.8 oz)  Height:       General: Pt is alert, awake, not in acute distress Cardiovascular: RRR, S1/S2 +, no rubs, no gallops Respiratory: CTA bilaterally, no wheezing, no rhonchi Abdominal: Soft, NT, ND, bowel sounds + Extremities: no edema, no cyanosis   The results of significant diagnostics from this hospitalization (including imaging, microbiology, ancillary and laboratory) are listed below for reference.     Microbiology: No results found for this or any previous visit (from the past 240 hour(s)).   Labs: BNP (last 3 results) No results for input(s): BNP in the last 8760 hours. Basic Metabolic Panel:  Recent Labs Lab 03/19/17 1940 03/21/17 0628 03/22/17 0445 03/23/17 0402 03/24/17 0238  NA 137 138 138 138 137  K 3.9 4.2 3.7 3.7 3.7  CL 106 107 106 104 106  CO2 23 27 26 27 26   GLUCOSE 133* 152* 171* 183* 166*  BUN 14 11 12 10 10   CREATININE 0.75 0.83 0.83 0.86 0.92  CALCIUM 8.8* 9.1 8.9 9.2 9.0   Liver Function Tests:  Recent Labs Lab 03/21/17 0628  AST 44*  ALT 39  ALKPHOS 72  BILITOT 0.8  PROT 6.9  ALBUMIN 3.2*   No results for input(s): LIPASE, AMYLASE in the last 168 hours. No results for input(s): AMMONIA in the last 168 hours. CBC:  Recent Labs Lab  03/19/17 1940 03/21/17 0628 03/22/17 0445 03/23/17 0402 03/24/17 0238  WBC 5.1 5.1 5.4 5.2 5.9  HGB 11.1* 12.2* 11.8* 12.8* 11.3*  HCT 34.5* 37.9* 35.8* 39.7 34.3*  MCV 86.7 86.3 86.5 87.1 86.2  PLT 120* 120* 129* 137* 119*   Cardiac Enzymes:  Recent Labs Lab 03/20/17 0419 03/20/17 1031  TROPONINI 0.08* 0.08*   BNP: Invalid input(s): POCBNP CBG:  Recent Labs Lab 03/23/17 0647 03/23/17 1132 03/23/17 1616 03/23/17  2103 03/24/17 0717  GLUCAP 168* 140* 170* 170* 153*   D-Dimer No results for input(s): DDIMER in the last 72 hours. Hgb A1c No results for input(s): HGBA1C in the last 72 hours. Lipid Profile No results for input(s): CHOL, HDL, LDLCALC, TRIG, CHOLHDL, LDLDIRECT in the last 72 hours. Thyroid function studies No results for input(s): TSH, T4TOTAL, T3FREE, THYROIDAB in the last 72 hours.  Invalid input(s): FREET3 Anemia work up No results for input(s): VITAMINB12, FOLATE, FERRITIN, TIBC, IRON, RETICCTPCT in the last 72 hours. Urinalysis    Component Value Date/Time   COLORURINE AMBER (A) 01/21/2016 0316   APPEARANCEUR CLOUDY (A) 01/21/2016 0316   LABSPEC 1.027 01/21/2016 0316   PHURINE 5.0 01/21/2016 0316   GLUCOSEU NEGATIVE 01/21/2016 0316   HGBUR TRACE (A) 01/21/2016 0316   BILIRUBINUR NEGATIVE 01/21/2016 0316   KETONESUR NEGATIVE 01/21/2016 0316   PROTEINUR NEGATIVE 01/21/2016 0316   NITRITE POSITIVE (A) 01/21/2016 0316   LEUKOCYTESUR NEGATIVE 01/21/2016 0316   Sepsis Labs Invalid input(s): PROCALCITONIN,  WBC,  LACTICIDVEN Microbiology No results found for this or any previous visit (from the past 240 hour(s)).  Time coordinating discharge: 33 minutes  SIGNED:  Irwin Brakeman, MD  [facility] 03/24/2017, 11:00 AM Pager [phone]  If 7PM-7AM, please contact night-coverage [email or web address] Password [email or web address]

## 2017-03-24 NOTE — Progress Notes (Signed)
CARDIAC REHAB PHASE I   Pt ambulated approximately 500 ft independently, no complaints. Completed MI/stent education.  Reviewed risk factors, MI book, anti-platelet therapy, stent card (unable to locate, RN aware), activity restrictions, ntg, exercise, heart healthy and diabetes diet handouts and phase 2 cardiac rehab. Pt verbalized understanding. Pt agrees to phase 2 cardiac rehab referral, pt's primary cardiologist is with [facility], will send referral card to [place]. Pt up ad lib in room, awaiting discharge.  [phone] Lenna Sciara, RN, BSN 03/24/2017 10:20 AM

## 2017-03-24 NOTE — Care Management Important Message (Signed)
Important Message  Patient Details  Name: Jerry [name] MRN: [id number] Date of Birth: [birth year]   Medicare Important Message Given:  Yes    [name] [address] 03/24/2017, 11:20 AM

## 2017-03-24 NOTE — Discharge Instructions (Signed)
Take Aspirin and Plavix for one month and then stop Aspirin.  Continue Plavix for one year.  Follow with Primary MD  Deberah Pelton, MD  and other consultant's as instructed your Hospitalist MD  Please get a complete blood count and chemistry panel checked by your Primary MD at your next visit, and again as instructed by your Primary MD.  Get Medicines reviewed and adjusted: Please take all your medications with you for your next visit with your Primary MD  Laboratory/radiological data: Please request your Primary MD to go over all hospital tests and procedure/radiological results at the follow up, please ask your Primary MD to get all Hospital records sent to his/her office.  In some cases, they will be blood work, cultures and biopsy results pending at the time of your discharge. Please request that your primary care M.D. follows up on these results.  Also Note the following: If you experience worsening of your admission symptoms, develop shortness of breath, life threatening emergency, suicidal or homicidal thoughts you must seek medical attention immediately by calling 911 or calling your MD immediately  if symptoms less severe.  You must read complete instructions/literature along with all the possible adverse reactions/side effects for all the Medicines you take and that have been prescribed to you. Take any new Medicines after you have completely understood and accpet all the possible adverse reactions/side effects.   Do not drive when taking Pain medications or sleeping medications (Benzodaizepines)  Do not take more than prescribed Pain, Sleep and Anxiety Medications. It is not advisable to combine anxiety,sleep and pain medications without talking with your primary care practitioner  Special Instructions: If you have smoked or chewed Tobacco  in the last 2 yrs please stop smoking, stop any regular Alcohol  and or any Recreational drug use.  Wear Seat belts while driving.  Please  note: You were cared for by a hospitalist during your hospital stay. Once you are discharged, your primary care physician will handle any further medical issues. Please note that NO REFILLS for any discharge medications will be authorized once you are discharged, as it is imperative that you return to your primary care physician (or establish a relationship with a primary care physician if you do not have one) for your post hospital discharge needs so that they can reassess your need for medications and monitor your lab values.

## 2017-03-24 NOTE — Progress Notes (Signed)
Progress Note  Patient Name: Jerry [name] Date of Encounter: 03/24/2017  Primary Cardiologist: [facility]  Subjective   Feeling well this morning. No chest pain.   Inpatient Medications    Scheduled Meds: . aspirin  81 mg Oral Daily  . clopidogrel  75 mg Oral Q breakfast  . finasteride  5 mg Oral Daily  . gabapentin  300 mg Oral BID  . insulin aspart  0-15 Units Subcutaneous TID WC  . insulin aspart  5 Units Subcutaneous TID WC  . insulin glargine  15 Units Subcutaneous QHS  . lisinopril  2.5 mg Oral Daily  . pantoprazole  40 mg Oral Daily  . rifaximin  550 mg Oral BID  . rosuvastatin  10 mg Oral Daily  . sodium chloride flush  3 mL Intravenous Q12H  . warfarin  15 mg Oral ONCE-1800  . Warfarin - Pharmacist Dosing Inpatient   Does not apply q1800   Continuous Infusions: . sodium chloride 10 mL/hr at 03/23/17 2015  . heparin 1,650 Units/hr (03/24/17 0418)   PRN Meds: sodium chloride, acetaminophen, acetaminophen, gi cocktail, nitroGLYCERIN, ondansetron (ZOFRAN) IV, ondansetron (ZOFRAN) IV, pramipexole, sodium chloride flush   Vital Signs    Vitals:   03/23/17 1630 03/23/17 1700 03/23/17 2047 03/24/17 0718  BP: 133/68 125/70 135/70 132/70  Pulse: 65 67 68 64  Resp: 18 20 20 17   Temp:   98.7 F (37.1 C) 98.4 F (36.9 C)  TempSrc:   Oral Oral  SpO2: 98% 97% 97% 98%  Weight:    191 lb 12.8 oz (87 kg)  Height:        Intake/Output Summary (Last 24 hours) at 03/24/17 0750 Last data filed at 03/24/17 0720  Gross per 24 hour  Intake           1468.6 ml  Output             2475 ml  Net          -1006.4 ml   Filed Weights   03/22/17 0315 03/23/17 0602 03/24/17 0718  Weight: 197 lb 9.6 oz (89.6 kg) 196 lb 12.8 oz (89.3 kg) 191 lb 12.8 oz (87 kg)    Telemetry    SR - Personally Reviewed  ECG    SR - Personally Reviewed  Physical Exam   General: Well developed, well nourished, male appearing in no acute distress. Head: Normocephalic,  atraumatic.  Neck: Supple without bruits, JVD. Lungs:  Resp regular and unlabored, CTA. Heart: RRR, S1, S2, no S3, S4, or murmur; no rub. Abdomen: Soft, non-tender, non-distended with normoactive bowel sounds. No hepatomegaly. No rebound/guarding. No obvious abdominal masses. Extremities: No clubbing, cyanosis, edema. Distal pedal pulses are 2+ bilaterally. Right femoral cath site stable.  Neuro: Alert and oriented X 3. Moves all extremities spontaneously. Psych: Normal affect.  Labs    Chemistry Recent Labs Lab 03/21/17 0628 03/22/17 0445 03/23/17 0402 03/24/17 0238  NA 138 138 138 137  K 4.2 3.7 3.7 3.7  CL 107 106 104 106  CO2 27 26 27 26   GLUCOSE 152* 171* 183* 166*  BUN 11 12 10 10   CREATININE 0.83 0.83 0.86 0.92  CALCIUM 9.1 8.9 9.2 9.0  PROT 6.9  --   --   --   ALBUMIN 3.2*  --   --   --   AST 44*  --   --   --   ALT 39  --   --   --  ALKPHOS 72  --   --   --   BILITOT 0.8  --   --   --   GFRNONAA >60 >60 >60 >60  GFRAA >60 >60 >60 >60  ANIONGAP 4* 6 7 5      Hematology Recent Labs Lab 03/22/17 0445 03/23/17 0402 03/24/17 0238  WBC 5.4 5.2 5.9  RBC 4.14* 4.56 3.98*  HGB 11.8* 12.8* 11.3*  HCT 35.8* 39.7 34.3*  MCV 86.5 87.1 86.2  MCH 28.5 28.1 28.4  MCHC 33.0 32.2 32.9  RDW 14.7 15.0 15.0  PLT 129* 137* 119*    Cardiac Enzymes Recent Labs Lab 03/20/17 0419 03/20/17 1031  TROPONINI 0.08* 0.08*    Recent Labs Lab 03/19/17 1944 03/19/17 2252  TROPIPOC 0.00 0.01     BNPNo results for input(s): BNP, PROBNP in the last 168 hours.   DDimer No results for input(s): DDIMER in the last 168 hours.    Radiology    No results found.  Cardiac Studies   Cath: 03/23/17  Conclusion     Patent circumflex stent.  Prox LAD lesion, 30 %stenosed.  Mid RCA lesion, 25 %stenosed.  Ost RCA to Prox RCA lesion, 20 %stenosed.  The left ventricular systolic function is normal.  LV end diastolic pressure is normal.  The left ventricular  ejection fraction is 55-65% by visual estimate.  There is no aortic valve stenosis.  Mid LAD lesion, 75 %stenosed.  A STENT SYNERGY DES 2.75X28 drug eluting stent was successfully placed, postdilated to 3.25 mm.  Post intervention, there is a 0% residual stenosis.   COntinue aspirin and Plavix for 30 days.  Warfarin can be restarted tonight.  Stop aspirin after 30 days.  Continue warfarin and clopidogrel going forward, ideally for at least 6 months.    Synergy stent was chosen so that antiplatelet therapy duration could be shortened if needed, since he is on warfarin.  Sheath to be removed 2 hours after angiomax finishes.    Patient Profile     66 y.o. male with PMH of CAD with prior stenting in 2010 @ [place], Portal vein thrombosis on warfarin, cirrhosis, DM, HTN, renal cancer s/p partial L nephrectomy in 2001 who presented with chest pain and positive troponin.   Assessment & Plan    1. CAD/STEMI: Underwent cardiac cath yesterday noted above with PCI/DES x1 to mLAD lesion. Plan for triple therapy with ASA/plavix/warfarin for one month, then stopping ASA with Plavix continued with warfarin. Post cath labs are stable.    2. HTN: stable with current therapy. No BB 2/2 to bradycardia  3. HL: on Crestor 35m, LDL 75 this admission. Recommend increasing to at least 254mdaily.   4. Hx of portal vein thrombosis: Warfarin resumed.    Signed, LiReino BellisNP  03/24/2017, 7:50 AM  Pager # [phone] Patient seen and examined and history reviewed. Agree with above findings and plan. Patient is doing well this am. No chest pain or SOB. Lungs are clear. No gallop. Cath site looks good. Ecg is normal. INR 1.44.  His IV heparin was resumed last night. He has portal vein thrombosis > 1 year ago. I don't think he needs heparin bridging. We can just resume Coumadin without bridging. He will be on ASA and Plavix for one month and then stop ASA. Hopefully can keep on Plavix for one year. Agree  with increased Crestor dose to 20 mg daily. [facility] OK for  discharge from our standpoint.   Jowel Waltner [place], [place] 03/24/2017 8:28 AM     For questions or updates, please contact [place] Please consult [email or web address] for contact info under Cardiology/STEMI. Daytime calls, contact the Day Call APP (6a-8a) or assigned team (Teams A-D) provider (7:30a - 5p). All other daytime calls (7:30-5p), contact the Card Master @ [phone].   Nighttime calls, contact the assigned APP (5p-8p) or MD (6:30p-8p). Overnight calls (8p-6a), contact the on call Fellow @ [phone].

## 2017-03-24 NOTE — Progress Notes (Signed)
ANTICOAGULATION CONSULT NOTE Pharmacy Consult: warfarin Indication:  History of VTE  Allergies  Allergen Reactions  . Metoprolol Other (See Comments)    bradycardia  . Penicillins Swelling    Swelling at injection site Has patient had a PCN reaction causing immediate rash, facial/tongue/throat swelling, SOB or lightheadedness with hypotension: No Has patient had a PCN reaction causing severe rash involving mucus membranes or skin necrosis: No Has patient had a PCN reaction that required hospitalization No Has patient had a PCN reaction occurring within the last 10 years: No If all of the above answers are "NO", then may proceed with Cephalosporin use.  . Terazosin Other (See Comments)    Orthostatic hypotension    Patient Measurements: Height: 5' 11"  (180.3 cm) Weight: 191 lb 12.8 oz (87 kg) IBW/kg (Calculated) : 75.3  Vital Signs: Temp: 98.4 F (36.9 C) (09/18 0718) Temp Source: Oral (09/18 0718) BP: 132/70 (09/18 0718) Pulse Rate: 64 (09/18 0718)  Labs:  Recent Labs  03/22/17 0445 03/22/17 1144 03/23/17 0402 03/24/17 0238  HGB 11.8*  --  12.8* 11.3*  HCT 35.8*  --  39.7 34.3*  PLT 129*  --  137* 119*  LABPROT  --  19.9* 17.8* 17.4*  INR  --  1.71 1.48 1.44  HEPARINUNFRC  --  0.28* 0.47 0.27*  CREATININE 0.83  --  0.86 0.92    Assessment: 66 y.o. male s/p cath with DES to LAD. Pt is on warfarin prior to admission for hx of a portal vein thrombosis over a year ago. Not planning to bridge in this patient. Also on plavix and aspirin. hgb 11.3, plts 119- slightly low at baseline.  PTA warfarin: 12.5 mg qThurs, 10 mg all other days  Goal of Therapy:  INR 2-3    Plan:  -Warfarin 10 mg po x1 -Daily INR

## 2017-03-24 NOTE — Progress Notes (Signed)
ANTICOAGULATION CONSULT NOTE Pharmacy Consult:  Heparin/Coumadin Indication:  History of VTE  Allergies  Allergen Reactions  . Metoprolol Other (See Comments)    bradycardia  . Penicillins Swelling    Swelling at injection site Has patient had a PCN reaction causing immediate rash, facial/tongue/throat swelling, SOB or lightheadedness with hypotension: No Has patient had a PCN reaction causing severe rash involving mucus membranes or skin necrosis: No Has patient had a PCN reaction that required hospitalization No Has patient had a PCN reaction occurring within the last 10 years: No If all of the above answers are "NO", then may proceed with Cephalosporin use.  . Terazosin Other (See Comments)    Orthostatic hypotension    Patient Measurements: Height: 5' 11"  (180.3 cm) Weight: 196 lb 12.8 oz (89.3 kg) IBW/kg (Calculated) : 75.3  Vital Signs: Temp: 98.7 F (37.1 C) (09/17 2047) Temp Source: Oral (09/17 2047) BP: 135/70 (09/17 2047) Pulse Rate: 68 (09/17 2047)  Labs:  Recent Labs  03/22/17 0445 03/22/17 1144 03/23/17 0402 03/24/17 0238  HGB 11.8*  --  12.8* 11.3*  HCT 35.8*  --  39.7 34.3*  PLT 129*  --  137* PENDING  LABPROT  --  19.9* 17.8* 17.4*  INR  --  1.71 1.48 1.44  HEPARINUNFRC  --  0.28* 0.47 0.27*  CREATININE 0.83  --  0.86 0.92    Estimated Creatinine Clearance: 83 mL/min (by C-G formula based on SCr of 0.92 mg/dL).  Assessment: 66 y.o. male with h/o VTE s/p cath for anticoagulation Goal of Therapy:  INR 2-3  Heparin level 0.3-0.7  Plan:  -Increase Heparin 1650 units/hr Check heparin level in 8 hours.  Coumadin 15 mg tonight  Phillis Knack, PharmD, BCPS  03/24/2017 4:06 AM

## 2017-03-27 ENCOUNTER — Observation Stay (HOSPITAL_COMMUNITY)
Admission: EM | Admit: 2017-03-27 | Discharge: 2017-03-28 | Disposition: A | Discharge status: Home / Self Care | Payer: Non-veteran care | Attending: Internal Medicine | Admitting: Internal Medicine

## 2017-03-27 ENCOUNTER — Encounter (HOSPITAL_COMMUNITY): Payer: Self-pay | Admitting: Emergency Medicine

## 2017-03-27 ENCOUNTER — Emergency Department (HOSPITAL_COMMUNITY): Payer: Non-veteran care

## 2017-03-27 DIAGNOSIS — I25110 Atherosclerotic heart disease of native coronary artery with unstable angina pectoris: Secondary | ICD-10-CM | POA: Diagnosis not present

## 2017-03-27 DIAGNOSIS — E7800 Pure hypercholesterolemia, unspecified: Secondary | ICD-10-CM | POA: Insufficient documentation

## 2017-03-27 DIAGNOSIS — I81 Portal vein thrombosis: Secondary | ICD-10-CM | POA: Insufficient documentation

## 2017-03-27 DIAGNOSIS — I25119 Atherosclerotic heart disease of native coronary artery with unspecified angina pectoris: Secondary | ICD-10-CM | POA: Diagnosis not present

## 2017-03-27 DIAGNOSIS — K7460 Unspecified cirrhosis of liver: Secondary | ICD-10-CM | POA: Insufficient documentation

## 2017-03-27 DIAGNOSIS — I700 Atherosclerosis of aorta: Secondary | ICD-10-CM | POA: Insufficient documentation

## 2017-03-27 DIAGNOSIS — I2510 Atherosclerotic heart disease of native coronary artery without angina pectoris: Secondary | ICD-10-CM | POA: Diagnosis not present

## 2017-03-27 DIAGNOSIS — K922 Gastrointestinal hemorrhage, unspecified: Secondary | ICD-10-CM | POA: Diagnosis not present

## 2017-03-27 DIAGNOSIS — Z905 Acquired absence of kidney: Secondary | ICD-10-CM | POA: Insufficient documentation

## 2017-03-27 DIAGNOSIS — R079 Chest pain, unspecified: Secondary | ICD-10-CM | POA: Diagnosis not present

## 2017-03-27 DIAGNOSIS — I110 Hypertensive heart disease with heart failure: Secondary | ICD-10-CM | POA: Insufficient documentation

## 2017-03-27 DIAGNOSIS — Z7984 Long term (current) use of oral hypoglycemic drugs: Secondary | ICD-10-CM | POA: Insufficient documentation

## 2017-03-27 DIAGNOSIS — Z7901 Long term (current) use of anticoagulants: Secondary | ICD-10-CM | POA: Insufficient documentation

## 2017-03-27 DIAGNOSIS — I5032 Chronic diastolic (congestive) heart failure: Secondary | ICD-10-CM | POA: Diagnosis not present

## 2017-03-27 DIAGNOSIS — Z880 Allergy status to penicillin: Secondary | ICD-10-CM | POA: Diagnosis not present

## 2017-03-27 DIAGNOSIS — Z7982 Long term (current) use of aspirin: Secondary | ICD-10-CM | POA: Insufficient documentation

## 2017-03-27 DIAGNOSIS — E119 Type 2 diabetes mellitus without complications: Secondary | ICD-10-CM | POA: Insufficient documentation

## 2017-03-27 DIAGNOSIS — R072 Precordial pain: Secondary | ICD-10-CM

## 2017-03-27 DIAGNOSIS — Z955 Presence of coronary angioplasty implant and graft: Secondary | ICD-10-CM | POA: Diagnosis not present

## 2017-03-27 DIAGNOSIS — Z85528 Personal history of other malignant neoplasm of kidney: Secondary | ICD-10-CM | POA: Diagnosis not present

## 2017-03-27 DIAGNOSIS — Z79899 Other long term (current) drug therapy: Secondary | ICD-10-CM | POA: Insufficient documentation

## 2017-03-27 DIAGNOSIS — I252 Old myocardial infarction: Secondary | ICD-10-CM | POA: Insufficient documentation

## 2017-03-27 LAB — CBC
HCT: 34.8 % — ABNORMAL LOW (ref 39.0–52.0)
HEMATOCRIT: 34.3 % — AB (ref 39.0–52.0)
HEMOGLOBIN: 11.2 g/dL — AB (ref 13.0–17.0)
HEMOGLOBIN: 11.3 g/dL — AB (ref 13.0–17.0)
MCH: 28.8 pg (ref 26.0–34.0)
MCH: 28.4 pg (ref 26.0–34.0)
MCHC: 32.7 g/dL (ref 30.0–36.0)
MCHC: 32.5 g/dL (ref 30.0–36.0)
MCV: 88.2 fL (ref 78.0–100.0)
MCV: 87.4 fL (ref 78.0–100.0)
Platelets: 150 10*3/uL (ref 150–400)
Platelets: 142 10*3/uL — ABNORMAL LOW (ref 150–400)
RBC: 3.89 MIL/uL — AB (ref 4.22–5.81)
RBC: 3.98 MIL/uL — AB (ref 4.22–5.81)
RDW: 15.7 % — ABNORMAL HIGH (ref 11.5–15.5)
RDW: 15.1 % (ref 11.5–15.5)
WBC: 5.3 10*3/uL (ref 4.0–10.5)
WBC: 5 10*3/uL (ref 4.0–10.5)

## 2017-03-27 LAB — BASIC METABOLIC PANEL
ANION GAP: 5 (ref 5–15)
BUN: 15 mg/dL (ref 6–20)
CALCIUM: 9 mg/dL (ref 8.9–10.3)
CHLORIDE: 107 mmol/L (ref 101–111)
CO2: 24 mmol/L (ref 22–32)
Creatinine, Ser: 0.93 mg/dL (ref 0.61–1.24)
GFR calc Af Amer: >60 mL/min (ref >60)
GFR calc non Af Amer: >60 mL/min (ref >60)
GLUCOSE: 136 mg/dL — AB (ref 65–99)
POTASSIUM: 4.2 mmol/L (ref 3.5–5.1)
Sodium: 136 mmol/L (ref 135–145)

## 2017-03-27 LAB — POC OCCULT BLOOD, ED: FECAL OCCULT BLD: POSITIVE — AB

## 2017-03-27 LAB — GLUCOSE, CAPILLARY: Glucose-Capillary: 118 mg/dL — ABNORMAL HIGH (ref 65–99)

## 2017-03-27 LAB — PROTIME-INR
INR: 1.61
Prothrombin Time: 19 seconds — ABNORMAL HIGH (ref 11.4–15.2)

## 2017-03-27 LAB — TROPONIN I: Troponin I: 0.08 ng/mL (ref <0.03)

## 2017-03-27 LAB — I-STAT TROPONIN, ED: Troponin i, poc: 0.01 ng/mL (ref 0.00–0.08)

## 2017-03-27 LAB — CBG MONITORING, ED: GLUCOSE-CAPILLARY: 101 mg/dL — AB (ref 65–99)

## 2017-03-27 LAB — MRSA PCR SCREENING: MRSA by PCR: NEGATIVE

## 2017-03-27 MED ORDER — CLOPIDOGREL BISULFATE 75 MG PO TABS
75.0000 mg | ORAL_TABLET | Freq: Every day | ORAL | Status: DC
  Administered 2017-03-28: 75 mg via ORAL
  Filled 2017-03-27: qty 1

## 2017-03-27 MED ORDER — INSULIN ASPART 100 UNIT/ML ~~LOC~~ SOLN
0.0000 [IU] | Freq: Three times a day (TID) | SUBCUTANEOUS | Status: DC
  Administered 2017-03-28: 3 [IU] via SUBCUTANEOUS

## 2017-03-27 MED ORDER — PANTOPRAZOLE SODIUM 40 MG IV SOLR
40.0000 mg | Freq: Two times a day (BID) | INTRAVENOUS | Status: DC
  Administered 2017-03-28: 40 mg via INTRAVENOUS
  Filled 2017-03-27: qty 40

## 2017-03-27 MED ORDER — ISOSORBIDE DINITRATE 10 MG PO TABS
90.0000 mg | ORAL_TABLET | Freq: Every day | ORAL | Status: DC
  Administered 2017-03-28: 90 mg via ORAL
  Filled 2017-03-27: qty 9

## 2017-03-27 MED ORDER — RIFAXIMIN 550 MG PO TABS
550.0000 mg | ORAL_TABLET | Freq: Two times a day (BID) | ORAL | Status: DC
  Administered 2017-03-27 – 2017-03-28 (×2): 550 mg via ORAL
  Filled 2017-03-27 (×3): qty 1

## 2017-03-27 MED ORDER — LISINOPRIL 2.5 MG PO TABS
2.5000 mg | ORAL_TABLET | Freq: Every day | ORAL | Status: DC
  Administered 2017-03-27 – 2017-03-28 (×2): 2.5 mg via ORAL
  Filled 2017-03-27 (×2): qty 1

## 2017-03-27 MED ORDER — PANTOPRAZOLE SODIUM 40 MG IV SOLR
40.0000 mg | Freq: Once | INTRAVENOUS | Status: AC
  Administered 2017-03-27: 40 mg via INTRAVENOUS
  Filled 2017-03-27: qty 40

## 2017-03-27 MED ORDER — GABAPENTIN 300 MG PO CAPS
300.0000 mg | ORAL_CAPSULE | Freq: Two times a day (BID) | ORAL | Status: DC
  Administered 2017-03-27 – 2017-03-28 (×2): 300 mg via ORAL
  Filled 2017-03-27 (×2): qty 1

## 2017-03-27 MED ORDER — SODIUM CHLORIDE 0.9 % IV SOLN
INTRAVENOUS | Status: DC
  Administered 2017-03-27: 75 mL/h via INTRAVENOUS
  Administered 2017-03-28: 10:00:00 via INTRAVENOUS

## 2017-03-27 MED ORDER — ISOSORBIDE DINITRATE 10 MG PO TABS
30.0000 mg | ORAL_TABLET | Freq: Every evening | ORAL | Status: DC
  Administered 2017-03-27: 30 mg via ORAL
  Filled 2017-03-27: qty 1

## 2017-03-27 MED ORDER — ACETAMINOPHEN 325 MG PO TABS
975.0000 mg | ORAL_TABLET | Freq: Three times a day (TID) | ORAL | Status: DC | PRN
  Administered 2017-03-27: 975 mg via ORAL
  Filled 2017-03-27: qty 3

## 2017-03-27 MED ORDER — NITROGLYCERIN 0.4 MG SL SUBL: 0.4000 mg | SUBLINGUAL_TABLET | SUBLINGUAL | Status: DC | PRN

## 2017-03-27 MED ORDER — NITROGLYCERIN 0.2 MG/HR TD PT24
0.2000 mg | MEDICATED_PATCH | Freq: Every day | TRANSDERMAL | Status: DC
  Administered 2017-03-27: 0.2 mg via TRANSDERMAL
  Filled 2017-03-27: qty 1

## 2017-03-27 MED ORDER — ROSUVASTATIN CALCIUM 20 MG PO TABS
20.0000 mg | ORAL_TABLET | Freq: Every day | ORAL | Status: DC
  Administered 2017-03-27 – 2017-03-28 (×2): 20 mg via ORAL
  Filled 2017-03-27: qty 2
  Filled 2017-03-27: qty 1

## 2017-03-27 MED ORDER — FINASTERIDE 5 MG PO TABS
5.0000 mg | ORAL_TABLET | Freq: Every day | ORAL | Status: DC
  Administered 2017-03-27 – 2017-03-28 (×2): 5 mg via ORAL
  Filled 2017-03-27 (×2): qty 1

## 2017-03-27 MED ORDER — ASPIRIN 81 MG PO CHEW
81.0000 mg | CHEWABLE_TABLET | Freq: Every day | ORAL | Status: DC
  Administered 2017-03-27 – 2017-03-28 (×2): 81 mg via ORAL
  Filled 2017-03-27 (×2): qty 1

## 2017-03-27 NOTE — ED Provider Notes (Signed)
  Face-to-face evaluation   History: Patient complains of left-sided chest pain radiating to arm.  Pain started during the night, he took nitro and it improved.  Took a second and a third nitro with EMS.  Since resolution of the pain, it has not returned as a constant discomfort however he has had some "fluttering chest pain," since then.  Physical exam: Alert, cooperative.  He is lucid.  No respiratory distress.  Medical screening examination/treatment/procedure(s) were conducted as a shared visit with non-physician practitioner(s) and myself.  I personally evaluated the patient during the encounter   Daleen Bo, MD 03/27/17 1550

## 2017-03-27 NOTE — Consult Note (Signed)
Cardiology Consultation:   Patient ID: Jerry [name]; [id number]; [birth year]   Admit date: 03/27/2017 Date of Consult: 03/27/2017  Primary Care Provider: Deberah Pelton, MD Primary Cardiologist: [facility]   Patient Profile:   Jerry [name] is a 66 y.o. male with a hx of PMH ofCAD with prior stenting in 2010 @ [place], Portal vein thrombosis on warfarin, cirrhosis, DM, HTN, renal cancer s/p partial L nephrectomy in 2001 and recently admitted 3/13-9/18 NSTEMI s/p PCI/DES x1 to mLAD lesion (discharged on Triple therapy with ASA/plavix/warfarin and plan to stop ASA in 1 month and hopefully continue to Plavix for one year; not on BB due to Bradycardia) who is being seen today for the evaluation of chest pain at the request of Dr. Eulis Foster  History of Present Illness:   Jerry [name] had intermittent chest pressure since discharge. Gradually worsen and infensified. Last night had dark stool. Fatigue and tired. This morning he woke up with not feeling well. Than had L sided chest pressure with radiating to arm. He took nitro x 2 prior to EMS arrival without improvement. His pain resolved after ASA 21m x 3 and 3rd nitro by EMS. He continued to have "intermittent chest discomfort since here".   EKG showed sinus rhythm at rate of 67 bpm, no acute changes - personally reviewed. POC troponin negative. Positive stool guaiac x 2. HGb 11.2 (at discharge 11.3). INR 1.61. CXR without active disease.   Past Medical History:  Diagnosis Date  . Coronary artery disease   . Diabetes mellitus without complication ([place]   . Hypercholesteremia   . Hypertension   . Liver lesion   . Primary cancer of kidney or ureter    KIDNEY CANCER    Past Surgical History:  Procedure Laterality Date  . CORONARY STENT INTERVENTION  03/23/2017   STENT SYNERGY DES 2.75X28 drug eluting stent was successfully placed  . CORONARY STENT INTERVENTION N/A 03/23/2017   Procedure: CORONARY STENT INTERVENTION;  Surgeon: VJettie Booze MD;  [facility];  Service: Cardiovascular;  Laterality: N/A;  . LEFT HEART CATH AND CORONARY ANGIOGRAPHY N/A 03/23/2017   Procedure: LEFT HEART CATH AND CORONARY ANGIOGRAPHY;  Surgeon: VJettie Booze MD;  [facility];  Service: Cardiovascular;  Laterality: N/A;  . PARTIAL NEPHRECTOMY Left   . SHOULDER SURGERY    . STENT PLACEMENT VASCULAR ([place])       Home Medications:  Prior to Admission medications   Medication Sig Start Date End Date Taking? Authorizing Provider  acetaminophen (TYLENOL) 325 MG tablet Take 975 mg by mouth 3 (three) times daily as needed for mild pain or fever.    [provider]  alprostadil (MUSE) 1000 MCG pellet 1,000 mcg by Transurethral route See admin instructions. Every 96 hours as needed for erection    [provider]  aspirin 81 MG chewable tablet Chew 81 mg by mouth daily.    [provider]  clopidogrel (PLAVIX) 75 MG tablet Take 1 tablet (75 mg total) by mouth daily with breakfast. 03/25/17 04/24/17  Johnson, Clanford L, MD  finasteride (PROSCAR) 5 MG tablet Take 5 mg by mouth daily.    [provider]  gabapentin (NEURONTIN) 300 MG capsule Take 300 mg by mouth 2 (two) times daily.    [provider]  glipiZIDE (GLUCOTROL) 5 MG tablet Take 1 tablet (5 mg total) by mouth 2 (two) times daily before a meal. 01/30/16   AReyne Dumas MD  isosorbide  dinitrate (ISORDIL) 30 MG tablet Take 30-90 mg by mouth See admin instructions. 4m in the morning and 355min the evening    [provider]  lisinopril (PRINIVIL,ZESTRIL) 5 MG tablet Take 2.5 mg by mouth daily.    [provider]  magnesium citrate SOLN Take 296 mLs (1 Bottle total) by mouth once as needed for severe constipation. 01/30/16   AbReyne DumasMD  magnesium oxide (MAG-OX) 400 MG tablet Take 400 mg by mouth every evening.     [provider]  metFORMIN (GLUCOPHAGE) 1000 MG tablet Take  1,000 mg by mouth 2 (two) times daily with a meal.    [provider]  nitroGLYCERIN (NITROSTAT) 0.4 MG SL tablet Place 0.4 mg under the tongue every 5 (five) minutes as needed for chest pain.    [provider]  pantoprazole (PROTONIX) 40 MG tablet Take 1 tablet (40 mg total) by mouth daily. 03/24/17 04/23/17  Johnson, Clanford L, MD  rifaximin (XIFAXAN) 550 MG TABS tablet Take 550 mg by mouth 2 (two) times daily.    [provider]  rosuvastatin (CRESTOR) 20 MG tablet Take 1 tablet (20 mg total) by mouth daily. 03/24/17 04/23/17  Johnson, Clanford L, MD  senna-docusate (SENOKOT-S) 8.6-50 MG tablet Take 1 tablet by mouth at bedtime as needed for mild constipation. 01/30/16   AbReyne DumasMD  simethicone (MYLICON) 80 MG chewable tablet Chew 160 mg by mouth 2 (two) times daily.     [provider]  warfarin (COUMADIN) 10 MG tablet Take 1-1.5 tablets (10-15 mg total) by mouth See admin instructions. 1074mvery day in evening, except on Thursdays 12.5mg51m18/18   JohnMurlean Iba    Inpatient Medications:  Not currently scheduled  Allergies:    Allergies  Allergen Reactions  . Metoprolol Other (See Comments)    bradycardia  . Penicillins Swelling    Swelling at injection site Has patient had a PCN reaction causing immediate rash, facial/tongue/throat swelling, SOB or lightheadedness with hypotension: No Has patient had a PCN reaction causing severe rash involving mucus membranes or skin necrosis: No Has patient had a PCN reaction that required hospitalization No Has patient had a PCN reaction occurring within the last 10 years: No If all of the above answers are "NO", then may proceed with Cephalosporin use.  . Terazosin Other (See Comments)    Orthostatic hypotension    Social History:   Social History   Social History  . Marital status: Divorced    Spouse name: N/A  . Number of children: N/A  . Years of education: N/A   Occupational  History  . Not on file.   Social History Main Topics  . Smoking status: Never Smoker  . Smokeless tobacco: Never Used  . Alcohol use No  . Drug use: No  . Sexual activity: Not on file   Other Topics Concern  . Not on file   Social History Narrative  . No narrative on file    Family History:    Family History  Problem Relation Age of Onset  . Stroke Mother   . Heart attack Mother   . Stroke Father   . Heart attack Father      ROS:  Please see the history of present illness.  ROS All other ROS reviewed and negative.     Physical Exam/Data:   Vitals:   03/27/17 1130 03/27/17 1145 03/27/17 1200 03/27/17 1215  BP: (!) 141/81 (!) 163/77 (!) 142/74 132/76  Pulse: 67 68 62 63  Resp: (!) 24 (!) 22 15 17   Temp:      TempSrc:      SpO2: 99% 99% 97% 97%  Weight:      Height:       No intake or output data in the 24 hours ending 03/27/17 1257 Filed Weights   03/27/17 1003  Weight: 191 lb (86.6 kg)   Body mass index is 26.64 kg/m.  General:  Well nourished, well developed, in no acute distress HEENT: normal Lymph: no adenopathy Neck: no JVD Endocrine:  No thryomegaly Vascular: No carotid bruits; FA pulses 2+ bilaterally without bruits  Cardiac:  normal S1, S2; RRR; no murmur  Lungs:  clear to auscultation bilaterally, no wheezing, rhonchi or rales  Abd: soft, nontender, no hepatomegaly  Ext: no edema Musculoskeletal:  No deformities, BUE and BLE strength normal and equal Skin: warm and dry  Neuro:  CNs 2-12 intact, no focal abnormalities noted Psych:  Normal affect     Relevant CV Studies: CORONARY STENT INTERVENTION  LEFT HEART CATH AND CORONARY ANGIOGRAPHY  Conclusion     Patent circumflex stent.  Prox LAD lesion, 30 %stenosed.  Mid RCA lesion, 25 %stenosed.  Ost RCA to Prox RCA lesion, 20 %stenosed.  The left ventricular systolic function is normal.  LV end diastolic pressure is normal.  The left ventricular ejection fraction is 55-65% by  visual estimate.  There is no aortic valve stenosis.  Mid LAD lesion, 75 %stenosed.  A STENT SYNERGY DES 2.75X28 drug eluting stent was successfully placed, postdilated to 3.25 mm.  Post intervention, there is a 0% residual stenosis.   COntinue aspirin and Plavix for 30 days.  Warfarin can be restarted tonight.  Stop aspirin after 30 days.  Continue warfarin and clopidogrel going forward, ideally for at least 6 months.    Synergy stent was chosen so that antiplatelet therapy duration could be shortened if needed, since he is on warfarin.  Sheath to be removed 2 hours after angiomax finishes.     Laboratory Data:  Chemistry Recent Labs Lab 03/23/17 0402 03/24/17 0238 03/27/17 1020  NA 138 137 136  K 3.7 3.7 4.2  CL 104 106 107  CO2 27 26 24   GLUCOSE 183* 166* 136*  BUN 10 10 15   CREATININE 0.86 0.92 0.93  CALCIUM 9.2 9.0 9.0  GFRNONAA >60 >60 >60  GFRAA >60 >60 >60  ANIONGAP 7 5 5      Recent Labs Lab 03/21/17 0628  PROT 6.9  ALBUMIN 3.2*  AST 44*  ALT 39  ALKPHOS 72  BILITOT 0.8   Hematology Recent Labs Lab 03/23/17 0402 03/24/17 0238 03/27/17 1020  WBC 5.2 5.9 5.3  RBC 4.56 3.98* 3.89*  HGB 12.8* 11.3* 11.2*  HCT 39.7 34.3* 34.3*  MCV 87.1 86.2 88.2  MCH 28.1 28.4 28.8  MCHC 32.2 32.9 32.7  RDW 15.0 15.0 15.7*  PLT 137* 119* 150   Cardiac EnzymesNo results for input(s): TROPONINI in the last 168 hours.  Recent Labs Lab 03/27/17 1018  TROPIPOC 0.01     Radiology/Studies:  Dg Chest 2 View  Result Date: 03/27/2017 CLINICAL DATA:  Left-sided chest pain radiating to the left arm. Recent cardiac stent placement. EXAM: CHEST  2 VIEW COMPARISON:  03/19/2017 FINDINGS: Artifact overlies the chest. Heart size is normal. Aortic atherosclerosis again demonstrated. The pulmonary vascularity is normal. Lungs are clear. No effusions. Ordinary degenerative changes affect the spine. IMPRESSION: No active disease.  Aortic atherosclerosis. Electronically  Signed   By: Nelson Chimes M.D.   On: 03/27/2017 10:54    Assessment and Plan:   1. Unstable angina - HX of CAD s/p Cx stent in 2010 @ [place] to [place] 03/23/17 - He was discharged on triple therapy. Now presenting with symptoms concerning for [place]. Responsive to nitro however pain reoccurs. EKG without acute changes. POC troponin negative.  -? If his pain from GI bleed. Continue DAPT with ASA and Plavix. Hold Coumadin until GI work up complete. Likely needs EGD+/-colonoscope. Recommended GI evaluation. Cycle troponin. Likely hood of stent thrombosis is less given normal EKG and cardiac marker.   2. Positive stool guaiac - Per admitting team  3. Hx portal vein thrombosis - Currently on triple therapy with ASA, Plavix and warfarin. INR 1.61. As above.    For questions or updates, please contact [place] Please consult [email or web address] for contact info under Cardiology/STEMI.   Jerry Soho, PA  03/27/2017 12:57 PM   Patient seen and examined and history reviewed. Agree with above findings and plan. 66 yo WM recently DC on 9/18 following stenting of LAD with DES. Patient DC on ASA and Plavix. Coumadin resumed for history of portal vein thrombosis > one year ago. Yesterday did not feel well and noted "twinges" of pain in chest. Had a dark black stool last pm and again today. Continued to have some twinges of chest pain and later pressure. Sense of unease. Some nausea. No NSAID use. On exam he is in no distress No JVD. Lungs clear. CV RRR without gallop or murmur Abd soft NT no masses.  Stools are heme positive. Hgb 11.2 unchanged from DC.  Ecg is normal.  Troponin is normal  Impression: 1. UGI bleed. Patient on DAPT with new DES stent in LAD. Coumadin resumed but not yet therapeutic. On Protonix. No NSAIDs. Needs GI evaluation for Upper endoscopy. Will need to continue DAPT for now. Monitor CBC. Hold coumadin until GI evaluation complete. 2. CAD s/p recent stent  of LAD on 9/17. No other obstructive disease. Very unlikely that symptoms are anginal. Stent thrombosis would be much more obvious with acute MI. Suspect all symptoms related to GI bleed. 3. History of cirrhosis. Patient reports 2 "spots" on liver followed at [place]. Planned MRI in October 4. History of portal vein thrombosis.  Zarra Geffert [place], [place] 03/27/2017 1:35 PM

## 2017-03-27 NOTE — ED Triage Notes (Signed)
Patient  presents today with complaints of left sided chest pain radiating to left arm. Patient reports he had a stent placed Monday. Patient reports he has also notice dark, black stools x2 nights. Patient reports he is in blood thinners. Patient was given 3 nitro and 324 ASA.

## 2017-03-27 NOTE — Consult Note (Signed)
Referring Provider: ED PA Primary Care Physician:  Deberah Pelton, MD Primary Gastroenterologist:  Althia Forts  Reason for Consultation:  Possible upper GI bleed  HPI: Jerry [name] is a 66 y.o. male with past medical history of coronary artery disease, history of cirrhosis, history of portal vein thrombosis on Coumadin who was recently discharged from hospital after being treated for NSTEMI with PCI and stent placement on 03/24/2017. Patient was subsequently discharged on aspirin and Plavix along with Coumadin. He came into EGD today again with the left-sided chest pain along with radiation of pain to the left arm. Patient was complaining of black stool since yesterday and GI is consulted for further evaluation.  Patient seen and examined at bedside in ED. According to patient he was having dizziness as well as intermittent chest pain for last 2 days. He noticed black color stool yesterday last night as well as had 1 episode of black color stool this morning. Denied any abdominal pain, nausea or vomiting. Denied any bright red blood per rectum.    history [facility]. Had EGD and colonoscopy around 1 year ago which was unremarkable according to patient. He has follow-up scheduled with GI/Hepatology on Oct 5th.   Past Medical History:  Diagnosis Date  . Coronary artery disease   . Diabetes mellitus without complication ([place])   . Hypercholesteremia   . Hypertension   . Liver lesion   . Primary cancer of kidney or ureter    KIDNEY CANCER    Past Surgical History:  Procedure Laterality Date  . CORONARY STENT INTERVENTION  03/23/2017   STENT SYNERGY DES 2.75X28 drug eluting stent was successfully placed  . CORONARY STENT INTERVENTION N/A 03/23/2017   Procedure: CORONARY STENT INTERVENTION;  Surgeon: Jettie Booze, MD;  Location: [place] [facility];  Service: Cardiovascular;  Laterality: N/A;  . LEFT HEART CATH AND CORONARY ANGIOGRAPHY N/A 03/23/2017   Procedure: LEFT HEART CATH AND CORONARY ANGIOGRAPHY;  Surgeon: Jettie Booze, MD;  Location: [place] [facility];  Service: Cardiovascular;  Laterality: N/A;  . PARTIAL NEPHRECTOMY Left   . SHOULDER SURGERY    . STENT PLACEMENT VASCULAR ([place] HX)      Prior to Admission medications   Medication Sig Start Date End Date Taking? Authorizing Provider  acetaminophen (TYLENOL) 325 MG tablet Take 975 mg by mouth 3 (three) times daily as needed for mild pain or fever.   Yes [provider]  alprostadil (MUSE) 1000 MCG pellet 1,000 mcg by Transurethral route See admin instructions. Every 96 hours as needed for erection   Yes [provider]  aspirin 81 MG chewable tablet Chew 81 mg by mouth daily.   Yes [provider]  clopidogrel (PLAVIX) 75 MG tablet Take 1 tablet (75 mg total) by mouth daily with breakfast. 03/25/17 04/24/17 Yes Johnson, Clanford L, MD  finasteride (PROSCAR) 5 MG tablet Take 5 mg by mouth daily.   Yes [provider]  gabapentin (NEURONTIN) 300 MG capsule Take 300 mg by mouth 2 (two) times daily.   Yes [provider]  glipiZIDE (GLUCOTROL) 5 MG tablet Take 1 tablet (5 mg total) by mouth 2 (two) times daily before a meal. 01/30/16  Yes Reyne Dumas, MD  isosorbide dinitrate (ISORDIL) 30 MG tablet Take 30-90 mg by mouth See admin instructions. 31m in the morning and 351min the evening   Yes [provider]  lisinopril (PRINIVIL,ZESTRIL) 5 MG tablet Take 2.5 mg by mouth daily.  Yes [provider]  magnesium citrate SOLN Take 296 mLs (1 Bottle total) by mouth once as needed for severe constipation. 01/30/16  Yes Reyne Dumas, MD  magnesium oxide (MAG-OX) 400 MG tablet Take 400 mg by mouth every evening.    Yes [provider]  metFORMIN (GLUCOPHAGE) 1000 MG tablet Take 1,000 mg by mouth 2 (two) times daily with a meal.   Yes [provider]  nitroGLYCERIN (NITROSTAT) 0.4 MG SL tablet Place 0.4 mg  under the tongue every 5 (five) minutes as needed for chest pain.   Yes [provider]  pantoprazole (PROTONIX) 40 MG tablet Take 1 tablet (40 mg total) by mouth daily. 03/24/17 04/23/17 Yes Johnson, Clanford L, MD  rifaximin (XIFAXAN) 550 MG TABS tablet Take 550 mg by mouth 2 (two) times daily.   Yes [provider]  rosuvastatin (CRESTOR) 20 MG tablet Take 1 tablet (20 mg total) by mouth daily. 03/24/17 04/23/17 Yes Johnson, Clanford L, MD  senna-docusate (SENOKOT-S) 8.6-50 MG tablet Take 1 tablet by mouth at bedtime as needed for mild constipation. 01/30/16  Yes Reyne Dumas, MD  simethicone (MYLICON) 80 MG chewable tablet Chew 160 mg by mouth 2 (two) times daily.    Yes [provider]  warfarin (COUMADIN) 10 MG tablet Take 1-1.5 tablets (10-15 mg total) by mouth See admin instructions. 65m every day in evening, except on Thursdays 12.584m9/18/18  Yes Johnson, Clanford L, MD    Scheduled Meds: Continuous Infusions: PRN Meds:.  Allergies as of 03/27/2017 - Review Complete 03/27/2017  Allergen Reaction Noted  . Metoprolol Other (See Comments) 03/19/2017  . Penicillins Swelling 01/21/2016  . Terazosin Other (See Comments) 03/19/2017    Family History  Problem Relation Age of Onset  . Stroke Mother   . Heart attack Mother   . Stroke Father   . Heart attack Father     Social History   Social History  . Marital status: Divorced    Spouse name: N/A  . Number of children: N/A  . Years of education: N/A   Occupational History  . Not on file.   Social History Main Topics  . Smoking status: Never Smoker  . Smokeless tobacco: Never Used  . Alcohol use No  . Drug use: No  . Sexual activity: Not on file   Other Topics Concern  . Not on file   Social History Narrative  . No narrative on file    Review of Systems: Review of Systems  Constitutional: Positive for malaise/fatigue. Negative for chills, fever and weight loss.  HENT: Negative for  hearing loss and tinnitus.   Eyes: Negative for blurred vision and double vision.  Respiratory: Negative for cough, hemoptysis and sputum production.   Cardiovascular: Positive for chest pain. Negative for palpitations and orthopnea.  Gastrointestinal: Positive for melena. Negative for abdominal pain, constipation, diarrhea, heartburn, nausea and vomiting.  Genitourinary: Negative for dysuria and urgency.  Musculoskeletal: Positive for neck pain. Negative for myalgias.  Skin: Negative for itching and rash.  Neurological: Positive for dizziness. Negative for seizures, loss of consciousness and weakness.  Endo/Heme/Allergies: Bruises/bleeds easily.  Psychiatric/Behavioral: Negative for hallucinations and suicidal ideas.    Physical Exam: Vital signs: Vitals:   03/27/17 1305 03/27/17 1315  BP: 140/74 136/71  Pulse: 64 (!) 59  Resp: 17 15  Temp:    SpO2: 98% 98%     Physical Exam  Constitutional: He is oriented to person, place, and time. He appears well-developed and well-nourished. No  distress.  HENT:  Head: Normocephalic and atraumatic.  Mouth/Throat: No oropharyngeal exudate.  Eyes: EOM are normal. Left eye exhibits no discharge.  Neck: Normal range of motion. Neck supple. No thyromegaly present.  Cardiovascular: Normal rate, regular rhythm and normal heart sounds.   Pulmonary/Chest: Effort normal and breath sounds normal. No respiratory distress.  Abdominal: Soft. Bowel sounds are normal. He exhibits no distension. There is no tenderness. There is no guarding.  Musculoskeletal: Normal range of motion. He exhibits no edema.  Neurological: He is alert and oriented to person, place, and time.  Skin: Skin is warm. No erythema.  Psychiatric: He has a normal mood and affect. His behavior is normal. Thought content normal.   GI:  Lab Results:  Recent Labs  03/27/17 1020  WBC 5.3  HGB 11.2*  HCT 34.3*  PLT 150   BMET  Recent Labs  03/27/17 1020  NA 136  K 4.2  CL 107   CO2 24  GLUCOSE 136*  BUN 15  CREATININE 0.93  CALCIUM 9.0   LFT No results for input(s): PROT, ALBUMIN, AST, ALT, ALKPHOS, BILITOT, BILIDIR, IBILI in the last 72 hours. PT/INR  Recent Labs  03/27/17 1020  LABPROT 19.0*  INR 1.61     Studies/Results: Dg Chest 2 View  Result Date: 03/27/2017 CLINICAL DATA:  Left-sided chest pain radiating to the left arm. Recent cardiac stent placement. EXAM: CHEST  2 VIEW COMPARISON:  03/19/2017 FINDINGS: Artifact overlies the chest. Heart size is normal. Aortic atherosclerosis again demonstrated. The pulmonary vascularity is normal. Lungs are clear. No effusions. Ordinary degenerative changes affect the spine. IMPRESSION: No active disease.  Aortic atherosclerosis. Electronically Signed   By: Nelson Chimes M.D.   On: 03/27/2017 10:54    Impression/Plan: - Two  episode of melena in setting of aspirin, Plavix and Coumadin use. - Recent NSTEMI - S/P PCI with stent placement on 03/24/2017. - History of portal vein thrombosis. Was on Coumadin , currently on hold - H/O Cirrhosis. Etiology unknown. [facility]   Recommendations -------------------------- - Patient has no further bleeding episodes. His hemoglobin is relatively stable. His BUN is normal. - He had normal EGD and colonoscopy around 1 year ago at [facility].  - Recommend conservative management for now. Start IV twice a day PPI. Okay to have full liquid diet from GI standpoint. - His Coumadin is on hold now. If he continues to have further bleeding episodes or drop in hemoglobin, he may need  diagnostic EGD while on aspirin and Plavix - GI will follow.    LOS: 0 days   Otis Brace  MD, FACP 03/27/2017, 2:32 PM  Pager [phone] If no answer or after 5 PM call [phone]

## 2017-03-27 NOTE — ED Notes (Signed)
Patient returned from xray.

## 2017-03-27 NOTE — H&P (Signed)
History and Physical:    Jerry [name]   HQI:[id number] DOB: [birth year] DOA: 03/27/2017  Referring MD/provider: PA Rondel Oh PCP: Deberah Pelton, MD   Patient coming from: Home  Chief Complaint: SSCP and dark stools   History of Present Illness:   Jerry [name] is an 66 y.o. male PMH ofCAD Status post recent NSTEMI last week with PCI/DES 1 placed 4 days ago and discharged on DUAPT and warfarin, portal vein thrombosis on warfarin, cirrhosis, DM, HTN, renal cancer s/p partial L nephrectomy in 2001 who now presents with chest pain and dark stools. Patient states he felt well until last night when he noted onset of substernal chest pressure which initially only lasted several seconds but then progressed to last a few minutes at a time. By the time patient called EMS patient had had substernal chest pressure associated with nausea and diaphoresis for 40 minutes. Patient took nitroglycerin 2 without improvement of discomfort but notes it did resolve after third nitroglycerin by EMS. Patient states he has had 2 recurrences of this chest pressure which lasted only a few seconds and resolve spontaneously since he has been in the ED.  Also of note patient states he had black stools last night and this morning. He denies any abdominal pain or diarrhea. Patient does admit to feeling dizzy when he first stands up however this resolves with ambulation. Patient denies any shortness of breath, orthopnea or PND. Patient denies any nausea or vomiting. Patient denies any hematemesis.  ED Course:  The patient was treated with nitrogen and oxygen. Patient had taken his carvedilol earlier today. EKG was without any acute changes and point-of-care troponin was negative. Of note patient stool was noted to be guaiac positive 2 although H&H are stable compared to 2 days ago. Cardiology was consult did and they recommended admission for possible unstable angina, however patient is unable to receive heparin due  to concern for GI bleed. Cardiology recommended continuing with dual antiplatelet therapy with aspirin and Plavix but to hold Coumadin until GI workup is complete. They note that the likelihood of in-stent thrombosis is low given normal EKG and negative troponin.  ROS:   ROS   Review of Systems: General: No fever, chills, weight changes Skin: No rashes, lesions, wounds Eyes: no discharge, redness, pain HENT: no ear pain, hearing loss, drainage, tinnitus Endocrine: no heat/cold intolerance, no polyuria Respiratory:No shortness of breath, hemoptysis GI: No nausea, vomiting, diarrhea, constipation GU: No dysuria, increased frequency CNS: No numbness, dizziness, headache Musculoskeletal: No back pain, joint pain Blood/lymphatics: No easy bruising, bleeding Mood/affect: No anxiety/depression    Past Medical History:   Past Medical History:  Diagnosis Date  . Coronary artery disease   . Diabetes mellitus without complication ([place])   . Hypercholesteremia   . Hypertension   . Liver lesion   . Primary cancer of kidney or ureter    KIDNEY CANCER    Past Surgical History:   Past Surgical History:  Procedure Laterality Date  . CORONARY STENT INTERVENTION  03/23/2017   STENT SYNERGY DES 2.75X28 drug eluting stent was successfully placed  . CORONARY STENT INTERVENTION N/A 03/23/2017   Procedure: CORONARY STENT INTERVENTION;  Surgeon: Jettie Booze, MD;  Location: [place] [facility];  Service: Cardiovascular;  Laterality: N/A;  . LEFT HEART CATH AND CORONARY ANGIOGRAPHY N/A 03/23/2017   Procedure: LEFT HEART CATH AND CORONARY ANGIOGRAPHY;  Surgeon: Jettie Booze, MD;  Location: [place] [facility];  Service: Cardiovascular;  Laterality: N/A;  .  PARTIAL NEPHRECTOMY Left   . SHOULDER SURGERY    . STENT PLACEMENT VASCULAR ([place] HX)      Social History:   Social History   Social History  . Marital status: Divorced    Spouse name: N/A  . Number of children: N/A  .  Years of education: N/A   Occupational History  . Not on file.   Social History Main Topics  . Smoking status: Never Smoker  . Smokeless tobacco: Never Used  . Alcohol use No  . Drug use: No  . Sexual activity: Not on file   Other Topics Concern  . Not on file   Social History Narrative  . No narrative on file    Allergies   Metoprolol; Penicillins; and Terazosin  Family history:   Family History  Problem Relation Age of Onset  . Stroke Mother   . Heart attack Mother   . Stroke Father   . Heart attack Father     Current Medications:   Prior to Admission medications   Medication Sig Start Date End Date Taking? Authorizing Provider  acetaminophen (TYLENOL) 325 MG tablet Take 975 mg by mouth 3 (three) times daily as needed for mild pain or fever.   Yes [provider]  alprostadil (MUSE) 1000 MCG pellet 1,000 mcg by Transurethral route See admin instructions. Every 96 hours as needed for erection   Yes [provider]  aspirin 81 MG chewable tablet Chew 81 mg by mouth daily.   Yes [provider]  clopidogrel (PLAVIX) 75 MG tablet Take 1 tablet (75 mg total) by mouth daily with breakfast. 03/25/17 04/24/17 Yes Johnson, Clanford L, MD  finasteride (PROSCAR) 5 MG tablet Take 5 mg by mouth daily.   Yes [provider]  gabapentin (NEURONTIN) 300 MG capsule Take 300 mg by mouth 2 (two) times daily.   Yes [provider]  glipiZIDE (GLUCOTROL) 5 MG tablet Take 1 tablet (5 mg total) by mouth 2 (two) times daily before a meal. 01/30/16  Yes Reyne Dumas, MD  isosorbide dinitrate (ISORDIL) 30 MG tablet Take 30-90 mg by mouth See admin instructions. 75m in the morning and 389min the evening   Yes [provider]  lisinopril (PRINIVIL,ZESTRIL) 5 MG tablet Take 2.5 mg by mouth daily.   Yes [provider]  magnesium citrate SOLN Take 296 mLs (1 Bottle total) by mouth once as needed for severe constipation. 01/30/16  Yes  AbReyne DumasMD  magnesium oxide (MAG-OX) 400 MG tablet Take 400 mg by mouth every evening.    Yes [provider]  metFORMIN (GLUCOPHAGE) 1000 MG tablet Take 1,000 mg by mouth 2 (two) times daily with a meal.   Yes [provider]  nitroGLYCERIN (NITROSTAT) 0.4 MG SL tablet Place 0.4 mg under the tongue every 5 (five) minutes as needed for chest pain.   Yes [provider]  pantoprazole (PROTONIX) 40 MG tablet Take 1 tablet (40 mg total) by mouth daily. 03/24/17 04/23/17 Yes Johnson, Clanford L, MD  rifaximin (XIFAXAN) 550 MG TABS tablet Take 550 mg by mouth 2 (two) times daily.   Yes [provider]  rosuvastatin (CRESTOR) 20 MG tablet Take 1 tablet (20 mg total) by mouth daily. 03/24/17 04/23/17 Yes Johnson, Clanford L, MD  senna-docusate (SENOKOT-S) 8.6-50 MG tablet Take 1 tablet by mouth at bedtime as needed for mild constipation. 01/30/16  Yes AbReyne DumasMD  simethicone (MYLICON) 80 MG chewable tablet Chew 160 mg by mouth 2 (  two) times daily.    Yes [provider]  warfarin (COUMADIN) 10 MG tablet Take 1-1.5 tablets (10-15 mg total) by mouth See admin instructions. 88m every day in evening, except on Thursdays 12.56m9/18/18  Yes JoMurlean IbaMD    Physical Exam:   Vitals:   03/27/17 1245 03/27/17 1300 03/27/17 1305 03/27/17 1315  BP: (!) 151/72 (!) 145/73 140/74 136/71  Pulse: (!) 59 60 64 (!) 59  Resp: 17 13 17 15   Temp:      TempSrc:      SpO2: 98% 98% 98% 98%  Weight:      Height:         Physical Exam: Blood pressure 136/71, pulse (!) 59, temperature 98.1 F (36.7 C), temperature source Oral, resp. rate 15, height 5' 11"  (1.803 m), weight 86.6 kg (191 lb), SpO2 98 %. Gen: Anxious appearing man lying in bed flat in no distress with supportive wife at bedside. Eyes: Sclerae anicteric. Conjunctiva mildly injected. Neck: Supple, no jugular venous distention. Chest: Moderately good air entry bilaterally with no  adventitious sounds.  CV: Distant, regular, no audible murmurs. Abdomen: NABS, soft, nondistended, nontender. No tenderness to light or deep palpation. No rebound, no guarding. Liver not palpable, no nodularity noted. Extremities: No edema.  Skin: Warm and dry. No rashes, lesions or wounds. Neuro: Alert and oriented times 3; grossly nonfocal. Psych: Patient is cooperative, logical and coherent with appropriate mood and affect.  Data Review:    Labs: Basic Metabolic Panel:  Recent Labs Lab 03/21/17 0628 03/22/17 0445 03/23/17 0402 03/24/17 0238 03/27/17 1020  NA 138 138 138 137 136  K 4.2 3.7 3.7 3.7 4.2  CL 107 106 104 106 107  CO2 27 26 27 26 24   GLUCOSE 152* 171* 183* 166* 136*  BUN 11 12 10 10 15   CREATININE 0.83 0.83 0.86 0.92 0.93  CALCIUM 9.1 8.9 9.2 9.0 9.0   Liver Function Tests:  Recent Labs Lab 03/21/17 0628  AST 44*  ALT 39  ALKPHOS 72  BILITOT 0.8  PROT 6.9  ALBUMIN 3.2*   No results for input(s): LIPASE, AMYLASE in the last 168 hours. No results for input(s): AMMONIA in the last 168 hours. CBC:  Recent Labs Lab 03/21/17 0628 03/22/17 0445 03/23/17 0402 03/24/17 0238 03/27/17 1020  WBC 5.1 5.4 5.2 5.9 5.3  HGB 12.2* 11.8* 12.8* 11.3* 11.2*  HCT 37.9* 35.8* 39.7 34.3* 34.3*  MCV 86.3 86.5 87.1 86.2 88.2  PLT 120* 129* 137* 119* 150   Cardiac Enzymes: No results for input(s): CKTOTAL, CKMB, CKMBINDEX, TROPONINI in the last 168 hours.  BNP (last 3 results) No results for input(s): PROBNP in the last 8760 hours. CBG:  Recent Labs Lab 03/23/17 1132 03/23/17 1616 03/23/17 2103 03/24/17 0717 03/24/17 1131  GLUCAP 140* 170* 170* 153* 251*    Urinalysis    Component Value Date/Time   COLORURINE AMBER (A) 01/21/2016 0316   APPEARANCEUR CLOUDY (A) 01/21/2016 0316   LABSPEC 1.027 01/21/2016 0316   PHURINE 5.0 01/21/2016 0316   GLUCOSEU NEGATIVE 01/21/2016 0316   HGBUR TRACE (A) 01/21/2016 0316   BILIRUBINUR NEGATIVE 01/21/2016 0316     KETONESUR NEGATIVE 01/21/2016 0316   PROTEINUR NEGATIVE 01/21/2016 0316   NITRITE POSITIVE (A) 01/21/2016 0316   LEUKOCYTESUR NEGATIVE 01/21/2016 0316      Radiographic Studies: Dg Chest 2 View  Result Date: 03/27/2017 CLINICAL DATA:  Left-sided chest pain radiating to the left arm. Recent cardiac stent placement. EXAM: CHEST  2 VIEW COMPARISON:  03/19/2017 FINDINGS: Artifact overlies the chest. Heart size is normal. Aortic atherosclerosis again demonstrated. The pulmonary vascularity is normal. Lungs are clear. No effusions. Ordinary degenerative changes affect the spine. IMPRESSION: No active disease.  Aortic atherosclerosis. Electronically Signed   By: Nelson Chimes M.D.   On: 03/27/2017 10:54    EKG: Independently reviewed. EKG showed sinus rhythm at rate of 67 bpm, no acute changes.  Assessment/Plan:   Active Problems:   Chest pain due to CAD  CP Patient with typical chest pain in a setting of known coronary artery disease and recent stent 3 days ago. Reassuringly EKG is negative and first troponin is also negative despite sustained 40 minutes of chest pain 6 hours ago. Patient is having twinges of substernal chest pressure. I will add nitroglycerin patch to his home medications only for the first 24 hours. Thereafter he can continue his Isordil. Would have low threshold for starting nitro drip if chest pain recurs despite the Nitropatch.  We are unfortunately unable to start heparin due to concern for GI bleed.  Admitted to stepdown unit and trend troponins every 6 hours and repeat EKG in a.m with close hemodynamic monitoring. Continue carvedilol, lisinopril and statin.  GI BLEED Patient with brown guaiac positive stool, he noted that it was black last night and this morning. Patient is hemodynamically stable so far and H&H are stable from discharge 2 days ago. Will hold warfarin but continue with dual antiplatelet therapy as recommended by cardiology. Given  anticoagulation, patient is at high risk for significant GI bleed which was explained to him. However given that his DES is only 81 days old, it would be unwise to stop his dual antiplatelet therapy at this time. Patient is aware that this is a potentially precarious situation. Will follow CBC every 6 hours. Follow vital signs very closely, type and screen is active. GI consultation has been requested. Continue IV Protonix as started in the ED.  DM Hold metformin and glipizide  SSI ac and hs   HTN Continue carvedilol and lisinopril.  PORTAL VEIN THROMBOSIS Warfarin being held as above.  BPH Continue finasteride  CIRRHOSIS Patient appears to be compensated without evidence of ascites or edema.         Other information:   DVT prophylaxis: SCD ordered Code Status: Full code. Family Communication: Patient's wife was at bedside throughout encounter  Disposition Plan: Home Consults called: GI and cardiology Admission status: Observation   The medical decision making on this patient was of high complexity and the patient is at high risk for clinical deterioration, therefore this is a level 3 visit.  Dewaine Oats Derek Jack [facility] Pager [phone] Cell: [phone]   If 7PM-7AM, please contact night-coverage [email or web address] Password [email or web address] 03/27/2017, 2:21 PM

## 2017-03-27 NOTE — ED Provider Notes (Signed)
[facility] DEPT Provider Note   CSN: [id number] Arrival date & time: 03/27/17  4656     History   Chief Complaint Chief Complaint  Patient presents with  . Chest Pain  . Blood In Stools    HPI Jerry [name] is a 66 y.o. male.  Patients with known coronary artery disease, status post 2 stents, most recent stent 03/23/2017, on chronic Coumadin therapy for portal vein thrombosis, now on Plavix and aspirin -- presents with recurrent left-sided chest pressure. Episodes have occurred at rest over the past 24 hours. They last for several minutes at a time. No radiation of pain. Pain has been more frequent this morning prompting ED visit. NTG and ASA by EMS. Associated shortness of breath. No diaphoresis or vomiting. Last night and this morning patient had an episode of dark stool which concerned him for blood. No lightheadedness, syncope, fatigue.no abdominal pain. No urinary symptoms or blood in urine. The onset of this condition was acute. The course is constant. Aggravating factors: none. Alleviating factors: none.        Past Medical History:  Diagnosis Date  . Coronary artery disease   . Diabetes mellitus without complication ([place])   . Hypercholesteremia   . Hypertension   . Liver lesion   . Primary cancer of kidney or ureter    KIDNEY CANCER    Patient Active Problem List   Diagnosis Date Noted  . Coronary artery disease involving native coronary artery of native heart with unstable angina pectoris ([place])   . Chest pain 03/19/2017  . Other cirrhosis of liver ([place]) 03/19/2017  . Portal vein thrombosis 01/21/2016  . Type 2 diabetes mellitus without complication, without long-term current use of insulin ([place]) 01/21/2016  . Coronary artery disease due to lipid rich plaque 01/21/2016  . Essential hypertension     Past Surgical History:  Procedure Laterality Date  . CORONARY STENT INTERVENTION  03/23/2017   STENT SYNERGY DES 2.75X28 drug eluting stent was successfully  placed  . CORONARY STENT INTERVENTION N/A 03/23/2017   Procedure: CORONARY STENT INTERVENTION;  Surgeon: Jettie Booze, MD;  Location: [facility];  Service: Cardiovascular;  Laterality: N/A;  . LEFT HEART CATH AND CORONARY ANGIOGRAPHY N/A 03/23/2017   Procedure: LEFT HEART CATH AND CORONARY ANGIOGRAPHY;  Surgeon: Jettie Booze, MD;  Location: [facility];  Service: Cardiovascular;  Laterality: N/A;  . PARTIAL NEPHRECTOMY Left   . SHOULDER SURGERY    . STENT PLACEMENT VASCULAR ([place] [facility]X)         Home Medications    Prior to Admission medications   Medication Sig Start Date End Date Taking? Authorizing Provider  acetaminophen (TYLENOL) 325 MG tablet Take 975 mg by mouth 3 (three) times daily as needed for mild pain or fever.    [provider]  alprostadil (MUSE) 1000 MCG pellet 1,000 mcg by Transurethral route See admin instructions. Every 96 hours as needed for erection    [provider]  aspirin 81 MG chewable tablet Chew 81 mg by mouth daily.    [provider]  clopidogrel (PLAVIX) 75 MG tablet Take 1 tablet (75 mg total) by mouth daily with breakfast. 03/25/17 04/24/17  Johnson, Clanford L, MD  finasteride (PROSCAR) 5 MG tablet Take 5 mg by mouth daily.    [provider]  gabapentin (NEURONTIN) 300 MG capsule Take 300 mg by mouth 2 (two) times daily.    [provider]  glipiZIDE (GLUCOTROL) 5 MG tablet Take  1 tablet (5 mg total) by mouth 2 (two) times daily before a meal. 01/30/16   Reyne Dumas, MD  isosorbide dinitrate (ISORDIL) 30 MG tablet Take 30-90 mg by mouth See admin instructions. 55m in the morning and 322min the evening    [provider]  lisinopril (PRINIVIL,ZESTRIL) 5 MG tablet Take 2.5 mg by mouth daily.    [provider]  magnesium citrate SOLN Take 296 mLs (1 Bottle total) by mouth once as needed for severe constipation. 01/30/16   AbReyne DumasMD  magnesium oxide (MAG-OX)  400 MG tablet Take 400 mg by mouth every evening.     [provider]  metFORMIN (GLUCOPHAGE) 1000 MG tablet Take 1,000 mg by mouth 2 (two) times daily with a meal.    [provider]  nitroGLYCERIN (NITROSTAT) 0.4 MG SL tablet Place 0.4 mg under the tongue every 5 (five) minutes as needed for chest pain.    [provider]  pantoprazole (PROTONIX) 40 MG tablet Take 1 tablet (40 mg total) by mouth daily. 03/24/17 04/23/17  Johnson, Clanford L, MD  rifaximin (XIFAXAN) 550 MG TABS tablet Take 550 mg by mouth 2 (two) times daily.    [provider]  rosuvastatin (CRESTOR) 20 MG tablet Take 1 tablet (20 mg total) by mouth daily. 03/24/17 04/23/17  Johnson, Clanford L, MD  senna-docusate (SENOKOT-S) 8.6-50 MG tablet Take 1 tablet by mouth at bedtime as needed for mild constipation. 01/30/16   AbReyne DumasMD  simethicone (MYLICON) 80 MG chewable tablet Chew 160 mg by mouth 2 (two) times daily.     [provider]  warfarin (COUMADIN) 10 MG tablet Take 1-1.5 tablets (10-15 mg total) by mouth See admin instructions. 1083mvery day in evening, except on Thursdays 12.5mg73m18/18   JohnMurlean Iba    Family History Family History  Problem Relation Age of Onset  . Stroke Mother   . Heart attack Mother   . Stroke Father   . Heart attack Father     Social History Social History  Substance Use Topics  . Smoking status: Never Smoker  . Smokeless tobacco: Never Used  . Alcohol use No     Allergies   Metoprolol; Penicillins; and Terazosin   Review of Systems Review of Systems  Constitutional: Negative for diaphoresis and fever.  Eyes: Negative for redness.  Respiratory: Negative for cough and shortness of breath.   Cardiovascular: Positive for chest pain. Negative for palpitations and leg swelling.  Gastrointestinal: Positive for blood in stool. Negative for abdominal pain, nausea and vomiting.  Genitourinary: Negative for dysuria.    Musculoskeletal: Negative for back pain and neck pain.  Skin: Negative for rash.  Neurological: Negative for syncope and light-headedness.  Psychiatric/Behavioral: The patient is not nervous/anxious.      Physical Exam Updated Vital Signs BP (!) 148/86   Pulse (!) 58   Temp 98.1 F (36.7 C) (Oral)   Resp 17   Ht 5' 11"  (1.803 m)   Wt 86.6 kg (191 lb)   SpO2 97%   BMI 26.64 kg/m   Physical Exam  Constitutional: He appears well-developed and well-nourished.  HENT:  Head: Normocephalic and atraumatic.  Mouth/Throat: Oropharynx is clear and moist.  Eyes: Conjunctivae are normal. Right eye exhibits no discharge. Left eye exhibits no discharge.  Neck: Normal range of motion. Neck supple.  Cardiovascular: Normal rate, regular rhythm and normal heart sounds.   No murmur heard. Pulmonary/Chest: Effort normal and breath sounds normal.  No respiratory distress. He has no wheezes. He has no rales.  Abdominal: Soft. There is no tenderness.  Genitourinary: Rectal exam shows external hemorrhoid (nonthrombosed, no bleeding). Rectal exam shows no internal hemorrhoid and guaiac negative stool. Prostate is not tender.  Genitourinary Comments: Brown stool  Neurological: He is alert.  Skin: Skin is warm and dry.  Psychiatric: He has a normal mood and affect.  Nursing note and vitals reviewed.    ED Treatments / Results  Labs (all labs ordered are listed, but only abnormal results are displayed) Labs Reviewed  BASIC METABOLIC PANEL - Abnormal; Notable for the following:       Result Value   Glucose, Bld 136 (*)    All other components within normal limits  CBC - Abnormal; Notable for the following:    RBC 3.89 (*)    Hemoglobin 11.2 (*)    HCT 34.3 (*)    RDW 15.7 (*)    All other components within normal limits  PROTIME-INR - Abnormal; Notable for the following:    Prothrombin Time 19.0 (*)    All other components within normal limits  POC OCCULT BLOOD, ED - Abnormal; Notable for  the following:    Fecal Occult Bld POSITIVE (*)    All other components within normal limits  I-STAT TROPONIN, ED    EKG  EKG Interpretation  Date/Time:  Friday March 27 2017 10:01:27 EDT Ventricular Rate:  67 PR Interval:    QRS Duration: 124 QT Interval:  423 QTC Calculation: 447 R Axis:   -8 Text Interpretation:  Sinus rhythm Nonspecific intraventricular conduction delay since last tracing no significant change Confirmed by Daleen Bo [phone]) on 03/27/2017 10:31:56 AM       Radiology Dg Chest 2 View  Result Date: 03/27/2017 CLINICAL DATA:  Left-sided chest pain radiating to the left arm. Recent cardiac stent placement. EXAM: CHEST  2 VIEW COMPARISON:  03/19/2017 FINDINGS: Artifact overlies the chest. Heart size is normal. Aortic atherosclerosis again demonstrated. The pulmonary vascularity is normal. Lungs are clear. No effusions. Ordinary degenerative changes affect the spine. IMPRESSION: No active disease.  Aortic atherosclerosis. Electronically Signed   By: Nelson Chimes M.D.   On: 03/27/2017 10:54    Procedures Procedures (including critical care time)  Medications Ordered in ED Medications - No data to display   Initial Impression / Assessment and Plan / ED Course  I have reviewed the triage vital signs and the nursing notes.  Pertinent labs & imaging results that were available during my care of the patient were reviewed by me and considered in my medical decision making (see chart for details).     Patient seen and examined. Work-up initiated. Medications ordered.   Vital signs reviewed and are as follows: BP (!) 163/77   Pulse 68   Temp 98.1 F (36.7 C) (Oral)   Resp (!) 22   Ht 5' 11"  (1.803 m)   Wt 86.6 kg (191 lb)   SpO2 99%   BMI 26.64 kg/m   Cards has been consulted. Requesting admit for rule-out and eval UGI bleed. Pt updated.   Spoke with Dr. Jamse Arn who will admit.   Will request consult from GI.   2:25 PM Requested consult from  [facility] who will see patient.   2:49 PM GI has seen. Pt can have liquids. No scope today.   Final Clinical Impressions(s) / ED Diagnoses   Final diagnoses:  Precordial chest pain  Upper GI bleeding   Admit. Pt  stable.   New Prescriptions New Prescriptions   No medications on file     Carlisle Cater, Hershal Coria 03/27/17 1426    Carlisle Cater, PA-C 03/27/17 1449    Daleen Bo, MD 03/27/17 1550

## 2017-03-27 NOTE — ED Notes (Signed)
Patient and family was given update. Patient verbalized understanding.

## 2017-03-27 NOTE — ED Notes (Signed)
Cardiology at bedside,

## 2017-03-27 NOTE — ED Notes (Addendum)
Next lab draw for troponin is @ 0130.

## 2017-03-27 NOTE — Progress Notes (Signed)
CRITICAL VALUE ALERT  Critical Value:  0.08 Troponin   Date & Time Notied: 03/27/17  2110  Provider Notified:  On-call Lynch  Orders Received/Actions taken: - No new order received will continue to monitor

## 2017-03-27 NOTE — ED Notes (Signed)
PA at bedside,

## 2017-03-27 NOTE — ED Notes (Signed)
PA student at bedside.

## 2017-03-28 ENCOUNTER — Encounter (HOSPITAL_COMMUNITY): Payer: Self-pay

## 2017-03-28 DIAGNOSIS — Z955 Presence of coronary angioplasty implant and graft: Secondary | ICD-10-CM | POA: Diagnosis not present

## 2017-03-28 DIAGNOSIS — E118 Type 2 diabetes mellitus with unspecified complications: Secondary | ICD-10-CM

## 2017-03-28 DIAGNOSIS — I10 Essential (primary) hypertension: Secondary | ICD-10-CM

## 2017-03-28 DIAGNOSIS — I81 Portal vein thrombosis: Secondary | ICD-10-CM

## 2017-03-28 DIAGNOSIS — Z7901 Long term (current) use of anticoagulants: Secondary | ICD-10-CM

## 2017-03-28 DIAGNOSIS — I110 Hypertensive heart disease with heart failure: Secondary | ICD-10-CM | POA: Diagnosis not present

## 2017-03-28 DIAGNOSIS — E1165 Type 2 diabetes mellitus with hyperglycemia: Secondary | ICD-10-CM

## 2017-03-28 DIAGNOSIS — K922 Gastrointestinal hemorrhage, unspecified: Secondary | ICD-10-CM | POA: Diagnosis not present

## 2017-03-28 DIAGNOSIS — R079 Chest pain, unspecified: Secondary | ICD-10-CM | POA: Diagnosis not present

## 2017-03-28 DIAGNOSIS — I5032 Chronic diastolic (congestive) heart failure: Secondary | ICD-10-CM | POA: Diagnosis not present

## 2017-03-28 DIAGNOSIS — I25110 Atherosclerotic heart disease of native coronary artery with unstable angina pectoris: Secondary | ICD-10-CM | POA: Diagnosis not present

## 2017-03-28 DIAGNOSIS — I2510 Atherosclerotic heart disease of native coronary artery without angina pectoris: Secondary | ICD-10-CM | POA: Diagnosis not present

## 2017-03-28 DIAGNOSIS — I200 Unstable angina: Secondary | ICD-10-CM | POA: Diagnosis not present

## 2017-03-28 DIAGNOSIS — K7469 Other cirrhosis of liver: Secondary | ICD-10-CM

## 2017-03-28 LAB — BASIC METABOLIC PANEL
ANION GAP: 6 (ref 5–15)
BUN: 10 mg/dL (ref 6–20)
CHLORIDE: 107 mmol/L (ref 101–111)
CO2: 22 mmol/L (ref 22–32)
Calcium: 9.1 mg/dL (ref 8.9–10.3)
Creatinine, Ser: 0.84 mg/dL (ref 0.61–1.24)
GFR calc non Af Amer: >60 mL/min (ref >60)
Glucose, Bld: 140 mg/dL — ABNORMAL HIGH (ref 65–99)
Potassium: 4.3 mmol/L (ref 3.5–5.1)
SODIUM: 135 mmol/L (ref 135–145)

## 2017-03-28 LAB — MAGNESIUM: MAGNESIUM: 1.7 mg/dL (ref 1.7–2.4)

## 2017-03-28 LAB — TROPONIN I
TROPONIN I: 0.08 ng/mL — AB (ref <0.03)
TROPONIN I: 0.08 ng/mL — AB (ref <0.03)

## 2017-03-28 LAB — CBC
HCT: 32.9 % — ABNORMAL LOW (ref 39.0–52.0)
HEMATOCRIT: 36.3 % — AB (ref 39.0–52.0)
HEMOGLOBIN: 11.9 g/dL — AB (ref 13.0–17.0)
Hemoglobin: 10.6 g/dL — ABNORMAL LOW (ref 13.0–17.0)
MCH: 28.1 pg (ref 26.0–34.0)
MCH: 28.5 pg (ref 26.0–34.0)
MCHC: 32.2 g/dL (ref 30.0–36.0)
MCHC: 32.8 g/dL (ref 30.0–36.0)
MCV: 87.3 fL (ref 78.0–100.0)
MCV: 87.1 fL (ref 78.0–100.0)
PLATELETS: 151 10*3/uL (ref 150–400)
Platelets: 148 10*3/uL — ABNORMAL LOW (ref 150–400)
RBC: 3.77 MIL/uL — ABNORMAL LOW (ref 4.22–5.81)
RBC: 4.17 MIL/uL — AB (ref 4.22–5.81)
RDW: 15.3 % (ref 11.5–15.5)
RDW: 15.1 % (ref 11.5–15.5)
WBC: 5.7 10*3/uL (ref 4.0–10.5)
WBC: 5.7 10*3/uL (ref 4.0–10.5)

## 2017-03-28 LAB — GLUCOSE, CAPILLARY
GLUCOSE-CAPILLARY: 120 mg/dL — AB (ref 65–99)
GLUCOSE-CAPILLARY: 224 mg/dL — AB (ref 65–99)

## 2017-03-28 MED ORDER — ISOSORBIDE DINITRATE 30 MG PO TABS: 90.0000 mg | ORAL_TABLET | Freq: Every day | ORAL | 0 refills | Status: DC

## 2017-03-28 MED ORDER — PANTOPRAZOLE SODIUM 40 MG PO TBEC: 40.0000 mg | DELAYED_RELEASE_TABLET | Freq: Every day | ORAL | 0 refills | Status: DC

## 2017-03-28 MED ORDER — ISOSORBIDE DINITRATE 30 MG PO TABS: ORAL_TABLET | ORAL | 0 refills | Status: DC

## 2017-03-28 NOTE — Progress Notes (Signed)
Progress Note  Patient Name: Jerry [name] Date of Encounter: 03/28/2017  Primary Cardiologist: Jerry [name] well.  Denies chest pain or shortness of breath with ambulation.  No recurrent melena.   Inpatient Medications    Scheduled Meds: . aspirin  81 mg Oral Daily  . clopidogrel  75 mg Oral Q breakfast  . finasteride  5 mg Oral Daily  . gabapentin  300 mg Oral BID  . insulin aspart  0-9 Units Subcutaneous TID WC  . isosorbide dinitrate  30 mg Oral QPM  . isosorbide dinitrate  90 mg Oral Daily  . lisinopril  2.5 mg Oral Daily  . nitroGLYCERIN  0.2 mg Transdermal Daily  . pantoprazole (PROTONIX) IV  40 mg Intravenous Q12H  . rifaximin  550 mg Oral BID  . rosuvastatin  20 mg Oral Daily   Continuous Infusions: . sodium chloride 75 mL/hr at 03/28/17 0930   PRN Meds: acetaminophen, nitroGLYCERIN   Vital Signs    Vitals:   03/27/17 2200 03/27/17 2300 03/28/17 0338 03/28/17 0900  BP: 122/78 122/65 (!) 106/49 127/79  Pulse:   (!) 57 69  Resp: 18  17   Temp: 99.2 F (37.3 C) 99.1 F (37.3 C) 99.1 F (37.3 C) (!) 97.3 F (36.3 C)  TempSrc: Oral Oral Oral Axillary  SpO2: 98%  97%   Weight:      Height:        Intake/Output Summary (Last 24 hours) at 03/28/17 1100 Last data filed at 03/28/17 0400  Gross per 24 hour  Intake           1015.5 ml  Output             2150 ml  Net          -1134.5 ml   Filed Weights   03/27/17 1003 03/27/17 2000  Weight: 86.6 kg (191 lb) 89.1 kg (196 lb 6.9 oz)    Telemetry    Sinus rhythm.  PVCs - Personally Reviewed  ECG    Sinus rhythm.  Rate 61 bpm.  - Personally Reviewed  Physical Exam   GEN: No acute distress.   Neck: No JVD Cardiac: RRR, no murmurs, rubs, or gallops.  Respiratory: Clear to auscultation bilaterally. GI: Soft, nontender, non-distended  MS: No edema; No deformity. Ext: Varicose veins Neuro:  Nonfocal  Psych: Normal affect    Labs    Chemistry Recent Labs Lab  03/24/17 0238 03/27/17 1020 03/28/17 0918  NA 137 136 135  K 3.7 4.2 4.3  CL 106 107 107  CO2 26 24 22   GLUCOSE 166* 136* 140*  BUN 10 15 10   CREATININE 0.92 0.93 0.84  CALCIUM 9.0 9.0 9.1  GFRNONAA >60 >60 >60  GFRAA >60 >60 >60  ANIONGAP 5 5 6      Hematology Recent Labs Lab 03/27/17 1924 03/28/17 0205 03/28/17 0918  WBC 5.0 5.7 5.7  RBC 3.98* 3.77* 4.17*  HGB 11.3* 10.6* 11.9*  HCT 34.8* 32.9* 36.3*  MCV 87.4 87.3 87.1  MCH 28.4 28.1 28.5  MCHC 32.5 32.2 32.8  RDW 15.1 15.3 15.1  PLT 142* 151 148*    Cardiac Enzymes Recent Labs Lab 03/27/17 1924 03/28/17 0205 03/28/17 0918  TROPONINI 0.08* 0.08* 0.08*    Recent Labs Lab 03/27/17 1018  TROPIPOC 0.01     BNPNo results for input(s): BNP, PROBNP in the last 168 hours.   DDimer No results for input(s): DDIMER in the last 168  hours.   Radiology    Dg Chest 2 View  Result Date: 03/27/2017 CLINICAL DATA:  Left-sided chest pain radiating to the left arm. Recent cardiac stent placement. EXAM: CHEST  2 VIEW COMPARISON:  03/19/2017 FINDINGS: Artifact overlies the chest. Heart size is normal. Aortic atherosclerosis again demonstrated. The pulmonary vascularity is normal. Lungs are clear. No effusions. Ordinary degenerative changes affect the spine. IMPRESSION: No active disease.  Aortic atherosclerosis. Electronically Signed   By: Jerry [name] M.D.   On: 03/27/2017 10:54    Cardiac Studies   LHC 03/23/17:  Patent circumflex stent.  Prox LAD lesion, 30 %stenosed.  Mid RCA lesion, 25 %stenosed.  Ost RCA to Prox RCA lesion, 20 %stenosed.  The left ventricular systolic function is normal.  LV end diastolic pressure is normal.  The left ventricular ejection fraction is 55-65% by visual estimate.  There is no aortic valve stenosis.  Mid LAD lesion, 75 %stenosed.  A STENT SYNERGY DES 2.75X28 drug eluting stent was successfully placed, postdilated to 3.25 mm.  Post intervention, there is a 0% residual  stenosis.     Patient Profile     66 y.o. male with CAD s/p PCI 2010 and NSTEMI with DES PCI 03/23/17, cirrhosis, portal vein thrombosis on warfarin, DM, HTN, renal cancer s/p partial L nephrectomy, admitted with CP and GI bleed.   Assessment & Plan    # Unstable angina: # s/p DES 03/23/17:   Troponin flat at 0.08. He is chest pain free. We will take off his nitro patch and let him walk.  If he continues to be stable, OK for discharge home.  He must continue aspirin and clopidogel. Continue rosuvastatin and Imdur.  # GI Bleed: Stable.  No recurrent bleed and h/h is climbing.  Jerry [name] has been evaluated by GI and his [facility] team was OK with holding warfarin.  However he chooses to continue.  Continue aspirin and clopidogrel given fresh stent.  He will follow up with outpatient GI.   # Hypertension:  BP stable.  Continue home lisinopril.    For questions or updates, please contact Jerry [name] Please consult [email or web address] for contact info under Cardiology/STEMI.      Signed, Jerry Latch, MD  03/28/2017, 11:00 AM

## 2017-03-28 NOTE — Discharge Summary (Signed)
Physician Discharge Summary  Jerry [name]:[id number] DOB: [birth year] DOA: 03/27/2017  PCP: Deberah Pelton, MD  Admit date: 03/27/2017 Discharge date: 03/28/2017  Time spent: 35 minutes  Recommendations for Outpatient Follow-up:  Chronic Diastolic CHF/Chest pain -0/35 Echocardiogram; EF 60-65%.-Grade 1 diastolic dysfunction   Unstable angina    Recent Labs Lab 03/27/17 1924 03/28/17 0205 03/28/17 0918  TROPONINI 0.08* 0.08* 0.08*  -Per cardiology He must continue aspirin and clopidogel. Continue rosuvastatin and Imdur. -Isordil 90 mg 1000 daily -Isordil 30 mg 1800 evening -Follow up in 2 weeks with Dr. Skeet Latch cardiology chronic diastolic CHF, chest pain  Essential HTN -See  unstable angina    GI bleed -Discontinue warfarin  -Follow up with hepatology team at the [facility]   DM type II uncontrolled with complications -5/97/4163 Hemoglobin A1c= 8.9 --Patient on 2 PO medications with uncontrolled diabetes. PCP to obtain new hemoglobin A1c, lipid panel, adjust medications. Given patient's cardiac condition recommended starting on insulin.   PORTAL VEIN THROMBOSIS -Warfarin discontinued per GI -[facility] be [facility], however patient had chosen to continue but now will BE held secondary GI bleed. Marland Kitchen   BPH -Continue finasteride   Liver CIRRHOSIS without Ascites -Patient appears to be compensated without evidence of ascites or edema.      Discharge Diagnoses:  Active Problems:   Chest pain due to CAD   Discharge Condition: Stable  Diet recommendation: Heart healthy  Filed Weights   03/27/17 1003 03/27/17 2000  Weight: 191 lb (86.6 kg) 196 lb 6.9 oz (89.1 kg)    History of present illness:  66 y.o. male PMHx CAD ---> NSTEMI last week S/P PCI/DES 1 placed 4 days ago and discharged on DUAPT, HTN , Portal Vein Thrombosis on warfarin, Liver Cirrhosis, DM2,  Renal Cancer S/P partial Left Nephrectomy in 2001. HLD    Presents with chest pain and dark stools. Patient states he felt well until last night when he noted onset of substernal chest pressure which initially only lasted several seconds but then progressed to last a few minutes at a time. By the time patient called EMS patient had had substernal chest pressure associated with nausea and diaphoresis for 40 minutes. Patient took nitroglycerin 2 without improvement of discomfort but notes it did resolve after third nitroglycerin by EMS. Patient states he has had 2 recurrences of this chest pressure which lasted only a few seconds and resolve spontaneously since he has been in the ED.   Also of note patient states he had black stools last night and this morning. He denies any abdominal pain or diarrhea. Patient does admit to feeling dizzy when he first stands up however this resolves with ambulation. Patient denies any shortness of breath, orthopnea or PND. Patient denies any nausea or vomiting. Patient denies any hematemesis.   ED Course:  The patient was treated with nitrogen and oxygen. Patient had taken his carvedilol earlier today. EKG was without any acute changes and point-of-care troponin was negative. Of note patient stool was noted to be guaiac positive 2 although H&H are stable compared to 2 days ago. Cardiology was consult did and they recommended admission for possible unstable angina, however patient is unable to receive heparin due to concern for GI bleed. Cardiology recommended continuing with dual antiplatelet therapy with aspirin and Plavix but to hold Coumadin until GI workup is complete. They note that the likelihood of in-stent thrombosis is low given normal EKG and negative troponin.  Consultations: Cardiology GI    Discharge Exam: Vitals:   03/27/17 2300 03/28/17 0338 03/28/17 0900 03/28/17 1200  BP: 122/65 (!) 106/49 127/79 110/62  Pulse:  (!) 57 69   Resp:  17    Temp: 99.1 F (37.3 C) 99.1 F (37.3 C) (!) 97.3 F (36.3  C) 98.2 F (36.8 C)  TempSrc: Oral Oral Axillary Oral  SpO2:  97%    Weight:      Height:        General: A/O 4, negative acute respiratory distress. Cardiovascular: Regular with a rate, negative murmurs rubs or gallops, normal S1/S2 Respiratory: clear to auscultation bilateral, negative wheezes, negative crackles    Discharge Instructions   Allergies as of 03/28/2017      Reactions   Metoprolol Other (See Comments)   bradycardia   Penicillins Swelling   Swelling at injection site Has patient had a PCN reaction causing immediate rash, facial/tongue/throat swelling, SOB or lightheadedness with hypotension: No Has patient had a PCN reaction causing severe rash involving mucus membranes or skin necrosis: No Has patient had a PCN reaction that required hospitalization No Has patient had a PCN reaction occurring within the last 10 years: No If all of the above answers are "NO", then may proceed with Cephalosporin use.   Terazosin Other (See Comments)   Orthostatic hypotension      Medication List    STOP taking these medications   warfarin 10 MG tablet Commonly known as:  COUMADIN     TAKE these medications   acetaminophen 325 MG tablet Commonly known as:  TYLENOL Take 975 mg by mouth 3 (three) times daily as needed for mild pain or fever.   alprostadil 1000 MCG pellet Commonly known as:  MUSE 1,000 mcg by Transurethral route See admin instructions. Every 96 hours as needed for erection   aspirin 81 MG chewable tablet Chew 81 mg by mouth daily.   clopidogrel 75 MG tablet Commonly known as:  PLAVIX Take 1 tablet (75 mg total) by mouth daily with breakfast.   finasteride 5 MG tablet Commonly known as:  PROSCAR Take 5 mg by mouth daily.   gabapentin 300 MG capsule Commonly known as:  NEURONTIN Take 300 mg by mouth 2 (two) times daily.   glipiZIDE 5 MG tablet Commonly known as:  GLUCOTROL Take 1 tablet (5 mg total) by mouth 2 (two) times daily before a meal.    isosorbide dinitrate 30 MG tablet Commonly known as:  ISORDIL 90 mg 1000 daily What changed:  how much to take  how to take this  when to take this  additional instructions   isosorbide dinitrate 30 MG tablet Commonly known as:  ISORDIL 30 mg at 1800 in the evening What changed:  You were already taking a medication with the same name, and this prescription was added. Make sure you understand how and when to take each.   lisinopril 5 MG tablet Commonly known as:  PRINIVIL,ZESTRIL Take 2.5 mg by mouth daily.   magnesium citrate Soln Take 296 mLs (1 Bottle total) by mouth once as needed for severe constipation.   magnesium oxide 400 MG tablet Commonly known as:  MAG-OX Take 400 mg by mouth every evening.   metFORMIN 1000 MG tablet Commonly known as:  GLUCOPHAGE Take 1,000 mg by mouth 2 (two) times daily with a meal.   nitroGLYCERIN 0.4 MG SL tablet Commonly known as:  NITROSTAT Place 0.4 mg under the tongue every 5 (five) minutes as needed for  chest pain.   pantoprazole 40 MG tablet Commonly known as:  PROTONIX Take 1 tablet (40 mg total) by mouth daily.   rifaximin 550 MG Tabs tablet Commonly known as:  XIFAXAN Take 550 mg by mouth 2 (two) times daily.   rosuvastatin 20 MG tablet Commonly known as:  CRESTOR Take 1 tablet (20 mg total) by mouth daily.   senna-docusate 8.6-50 MG tablet Commonly known as:  Senokot-S Take 1 tablet by mouth at bedtime as needed for mild constipation.   simethicone 80 MG chewable tablet Commonly known as:  MYLICON Chew 027 mg by mouth 2 (two) times daily.            Discharge Care Instructions        Start     Ordered   03/28/17 0000  isosorbide dinitrate (ISORDIL) 30 MG tablet     03/28/17 1351   03/28/17 0000  isosorbide dinitrate (ISORDIL) 30 MG tablet     03/28/17 1351   03/28/17 0000  pantoprazole (PROTONIX) 40 MG tablet  Daily     03/28/17 1545     Allergies  Allergen Reactions  . Metoprolol Other (See  Comments)    bradycardia  . Penicillins Swelling    Swelling at injection site Has patient had a PCN reaction causing immediate rash, facial/tongue/throat swelling, SOB or lightheadedness with hypotension: No Has patient had a PCN reaction causing severe rash involving mucus membranes or skin necrosis: No Has patient had a PCN reaction that required hospitalization No Has patient had a PCN reaction occurring within the last 10 years: No If all of the above answers are "NO", then may proceed with Cephalosporin use.  . Terazosin Other (See Comments)    Orthostatic hypotension      The results of significant diagnostics from this hospitalization (including imaging, microbiology, ancillary and laboratory) are listed below for reference.    Significant Diagnostic Studies: Dg Chest 2 View  Result Date: 03/27/2017 CLINICAL DATA:  Left-sided chest pain radiating to the left arm. Recent cardiac stent placement. EXAM: CHEST  2 VIEW COMPARISON:  03/19/2017 FINDINGS: Artifact overlies the chest. Heart size is normal. Aortic atherosclerosis again demonstrated. The pulmonary vascularity is normal. Lungs are clear. No effusions. Ordinary degenerative changes affect the spine. IMPRESSION: No active disease.  Aortic atherosclerosis. Electronically Signed   By: Nelson Chimes M.D.   On: 03/27/2017 10:54   Dg Chest Portable 1 View  Result Date: 03/19/2017 CLINICAL DATA:  Intermittent chest pain EXAM: PORTABLE CHEST 1 VIEW COMPARISON:  01/29/2016 FINDINGS: Borderline cardiomegaly, accentuated by portable technique. Mild interstitial crowding at the bases. There is no edema, consolidation, effusion, or pneumothorax. IMPRESSION: No evidence of active disease. Electronically Signed   By: Monte Fantasia M.D.   On: 03/19/2017 19:28    Microbiology: Recent Results (from the past 240 hour(s))  MRSA PCR Screening     Status: None   Collection Time: 03/27/17  8:30 PM  Result Value Ref Range Status   MRSA by PCR  NEGATIVE NEGATIVE Final    Comment:        The GeneXpert MRSA Assay (FDA approved for NASAL specimens only), is one component of a comprehensive MRSA colonization surveillance program. It is not intended to diagnose MRSA infection nor to guide or monitor treatment for MRSA infections.      Labs: Basic Metabolic Panel:  Recent Labs Lab 03/22/17 0445 03/23/17 0402 03/24/17 0238 03/27/17 1020 03/28/17 0918  NA 138 138 137 136 135  K 3.7 3.7  3.7 4.2 4.3  CL 106 104 106 107 107  CO2 26 27 26 24 22   GLUCOSE 171* 183* 166* 136* 140*  BUN 12 10 10 15 10   CREATININE 0.83 0.86 0.92 0.93 0.84  CALCIUM 8.9 9.2 9.0 9.0 9.1  MG  --   --   --   --  1.7   Liver Function Tests: No results for input(s): AST, ALT, ALKPHOS, BILITOT, PROT, ALBUMIN in the last 168 hours. No results for input(s): LIPASE, AMYLASE in the last 168 hours. No results for input(s): AMMONIA in the last 168 hours. CBC:  Recent Labs Lab 03/24/17 0238 03/27/17 1020 03/27/17 1924 03/28/17 0205 03/28/17 0918  WBC 5.9 5.3 5.0 5.7 5.7  HGB 11.3* 11.2* 11.3* 10.6* 11.9*  HCT 34.3* 34.3* 34.8* 32.9* 36.3*  MCV 86.2 88.2 87.4 87.3 87.1  PLT 119* 150 142* 151 148*   Cardiac Enzymes:  Recent Labs Lab 03/27/17 1924 03/28/17 0205 03/28/17 0918  TROPONINI 0.08* 0.08* 0.08*   BNP: BNP (last 3 results) No results for input(s): BNP in the last 8760 hours.  ProBNP (last 3 results) No results for input(s): PROBNP in the last 8760 hours.  CBG:  Recent Labs Lab 03/24/17 1131 03/27/17 1753 03/27/17 2152 03/28/17 0842 03/28/17 1209  GLUCAP 251* 101* 118* 120* 224*       Signed:  Dia Crawford, MD [facility] [phone] pager

## 2017-03-28 NOTE — Progress Notes (Signed)
Jerry [name] 10:09 AM  Subjective: Patient without signs of further bleeding and no bowel movement today and tolerating his diet and no complaints and his hospital computer chart reviewed and his case discussed with my partner Dr. B and his [place] doctors wanted to stop his Coumadin but he had requested to stay on it and we discussed his Plavix and aspirin and he has no other complaints  Objective: Vital signs stable afebrile no acute distress abdomen is soft nontender labs stable  Assessment: Seemingly resolved GI bleeding  Plan: Since he has a follow-up in early October with his primary GI and hepatology team at the [facility] it does not seem he requires urgent endoscopy and please call me this weekend if I could be of any further assistance otherwise he is comfortable stopping his Coumadin because supposedly his thrombosis has resolved and we will be on standby to help as needed  [facility]  Pager [phone] After 5PM or if no answer call [phone]

## 2017-03-28 NOTE — Plan of Care (Signed)
Problem: Pain Managment: Goal: General experience of comfort will improve Outcome: Progressing Patient complaining of headache earlier, given medication per [facility] and headache was not relived, on reassessment patient was asleep, will continue to monitor.

## 2017-04-01 ENCOUNTER — Telehealth (HOSPITAL_COMMUNITY): Payer: Self-pay

## 2017-04-01 NOTE — Telephone Encounter (Signed)
I called ans spoke with patient about referral we received for cardiac rehab. Patient informed that he needs to contact [facility] to get authorization to be seen here. Patient [facility] and patient stated that he has an appointment soon. Patient informed to ask for authorization to be sent to our office for cardiac rehab. Patient agreed.

## 2017-07-02 ENCOUNTER — Ambulatory Visit: Payer: Medicare Other

## 2017-07-06 ENCOUNTER — Encounter: Payer: Non-veteran care | Attending: General Practice | Admitting: *Deleted

## 2017-07-06 VITALS — Ht 71.0 in | Wt 202.0 lb

## 2017-07-06 DIAGNOSIS — Z955 Presence of coronary angioplasty implant and graft: Secondary | ICD-10-CM

## 2017-07-06 NOTE — Progress Notes (Signed)
Cardiac Individual Treatment Plan  Patient Details  Name: Jerry [name] MRN: [id number] Date of Birth: [birth year] Referring Provider:     Cardiac Rehab from 07/06/2017 in [facility] Cardiac and Pulmonary Rehab  Referring Provider  [name] MD ([place])      Initial Encounter Date:    Cardiac Rehab from 07/06/2017 in [facility] Cardiac and Pulmonary Rehab  Date  07/06/17  Referring Provider  [name] MD ([place])      Visit Diagnosis: Status post coronary artery stent placement  Patient's Home Medications on Admission:  Current Outpatient Medications:  .  acetaminophen (TYLENOL) 325 MG tablet, Take 975 mg by mouth 3 (three) times daily as needed for mild pain or fever., Disp: , Rfl:  .  aspirin 81 MG chewable tablet, Chew 81 mg by mouth daily., Disp: , Rfl:  .  carvedilol (COREG) 6.25 MG tablet, Take 6.25 mg by mouth 2 (two) times daily with a meal., Disp: , Rfl:  .  clopidogrel (PLAVIX) 75 MG tablet, Take 75 mg by mouth daily., Disp: , Rfl:  .  finasteride (PROSCAR) 5 MG tablet, Take 5 mg by mouth daily., Disp: , Rfl:  .  gabapentin (NEURONTIN) 300 MG capsule, Take 300 mg by mouth 2 (two) times daily., Disp: , Rfl:  .  glipiZIDE (GLUCOTROL) 5 MG tablet, Take 1 tablet (5 mg total) by mouth 2 (two) times daily before a meal., Disp: 60 tablet, Rfl: 1 .  isosorbide dinitrate (ISORDIL) 30 MG tablet, 90 mg 1000 daily, Disp: 90 tablet, Rfl: 0 .  isosorbide dinitrate (ISORDIL) 30 MG tablet, 30 mg at 1800 in the evening, Disp: 30 tablet, Rfl: 0 .  lisinopril (PRINIVIL,ZESTRIL) 5 MG tablet, Take 5 mg by mouth daily. , Disp: , Rfl:  .  magnesium oxide (MAG-OX) 400 MG tablet, Take 400 mg by mouth every evening. , Disp: , Rfl:  .  metFORMIN (GLUCOPHAGE) 1000 MG tablet, Take 500 mg by mouth 2 (two) times daily with a meal. , Disp: , Rfl:  .  nitroGLYCERIN (NITROSTAT) 0.4 MG SL tablet, Place 0.4 mg under the tongue every 5 (five) minutes as needed for chest pain., Disp: , Rfl:  .   omeprazole (PRILOSEC) 20 MG capsule, Take 20 mg by mouth 2 (two) times daily., Disp: , Rfl:  .  rifaximin (XIFAXAN) 550 MG TABS tablet, Take 550 mg by mouth 2 (two) times daily., Disp: , Rfl:  .  rosuvastatin (CRESTOR) 20 MG tablet, Take 1 tablet (20 mg total) by mouth daily. (Patient taking differently: Take 10 mg by mouth daily. ), Disp: 30 tablet, Rfl: 0 .  simethicone (MYLICON) 80 MG chewable tablet, Chew 160 mg by mouth 2 (two) times daily. , Disp: , Rfl:  .  alprostadil (MUSE) 1000 MCG pellet, 1,000 mcg by Transurethral route See admin instructions. Every 96 hours as needed for erection, Disp: , Rfl:  .  magnesium citrate SOLN, Take 296 mLs (1 Bottle total) by mouth once as needed for severe constipation. (Patient not taking: Reported on 07/06/2017), Disp: 195 mL, Rfl: 1 .  pantoprazole (PROTONIX) 40 MG tablet, Take 1 tablet (40 mg total) by mouth daily., Disp: 30 tablet, Rfl: 0 .  senna-docusate (SENOKOT-S) 8.6-50 MG tablet, Take 1 tablet by mouth at bedtime as needed for mild constipation. (Patient not taking: Reported on 07/06/2017), Disp: 60 tablet, Rfl: 1  Past Medical History: Past Medical History:  Diagnosis Date  . Coronary artery disease   . Diabetes mellitus without complication ([place])   .  Hypercholesteremia   . Hypertension   . Liver lesion   . Primary cancer of kidney or ureter    KIDNEY CANCER    Tobacco Use: Social History   Tobacco Use  Smoking Status Never Smoker  Smokeless Tobacco Never Used    Labs: Recent Review Flowsheet Data    Labs for ITP Cardiac and Pulmonary Rehab Latest Ref Rng & Units 01/21/2016 01/26/2016 01/28/2016 03/20/2017 03/21/2017   Cholestrol 0 - 200 mg/dL - - - 134 129   LDLCALC 0 - 99 mg/dL - - - 71 75   HDL >40 mg/dL - - - 26(L) 27(L)   Trlycerides <150 mg/dL - 94 79 185(H) 134   Hemoglobin A1c 4.8 - 5.6 % 8.9(H) - - - -       Exercise Target Goals: Date: 07/06/17  Exercise Program Goal: Individual exercise prescription set with  THRR, safety & activity barriers. Participant demonstrates ability to understand and report RPE using BORG scale, to self-measure pulse accurately, and to acknowledge the importance of the exercise prescription.  Exercise Prescription Goal: Starting with aerobic activity 30 plus minutes a day, 3 days per week for initial exercise prescription. Provide home exercise prescription and guidelines that participant acknowledges understanding prior to discharge.  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 HR  103 bpm     Max Ex. BP  130/56     2 Minute Post BP  124/64        Oxygen Initial Assessment:   Oxygen Re-Evaluation:   Oxygen Discharge (Final Oxygen Re-Evaluation):   Initial Exercise Prescription: Initial Exercise Prescription - 07/06/17 1300      Date of Initial Exercise RX and Referring Provider   Date  07/06/17    Referring Provider  [name] MD ([place])      Treadmill   MPH  2.5    Grade  1    Minutes  15    METs  3.26      Bike   Level  1    Watts  37    Minutes  15    METs  3.2      Biostep-RELP   Level  3    SPM  50    Minutes  15    METs  3      Prescription Details  Frequency (times per  week)  3    Duration  Progress to 45 minutes of aerobic exercise without signs/symptoms of physical distress      Intensity   THRR 40-80% of Max Heartrate  98-135    Ratings of Perceived Exertion  11-13    Perceived Dyspnea  0-4      Progression   Progression  Continue to progress workloads to maintain intensity without signs/symptoms of physical distress.      Resistance Training   Training Prescription  Yes    Weight  3 lbs    Reps  10-15       Perform Capillary Blood Glucose checks as needed.  Exercise Prescription Changes: Exercise Prescription Changes    Row Name 07/06/17 1300             Response to Exercise   Blood Pressure (Admit)  122/60       Blood Pressure (Exercise)  130/56       Blood Pressure (Exit)  124/64       Heart Rate (Admit)  63 bpm       Heart Rate (Exercise)  103 bpm       Heart Rate (Exit)  62 bpm       Oxygen Saturation (Admit)  98 %       Oxygen Saturation (Exit)  98 %       Rating of Perceived Exertion (Exercise)  12       Symptoms  none       Comments  walk test results          Exercise Comments:   Exercise Goals and Review: Exercise Goals    Row Name 07/06/17 1407             Exercise Goals   Increase Physical Activity  Yes       Intervention  Provide advice, education, support and counseling about physical activity/exercise needs.;Develop an individualized exercise prescription for aerobic and resistive training based on initial evaluation findings, risk stratification, comorbidities and participant's personal goals.       Expected Outcomes  Achievement of increased cardiorespiratory fitness and enhanced flexibility, muscular endurance and strength shown through measurements of functional capacity and personal statement of participant.       Increase Strength and Stamina  Yes       Intervention  Provide advice, education, support and counseling about physical activity/exercise needs.;Develop an individualized exercise  prescription for aerobic and resistive training based on initial evaluation findings, risk stratification, comorbidities and participant's personal goals.       Expected Outcomes  Achievement of increased cardiorespiratory fitness and enhanced flexibility, muscular endurance and strength shown through measurements of functional capacity and personal statement of participant.       Able to understand and use rate of perceived exertion (RPE) scale  Yes       Intervention  Provide education and explanation on how to use RPE scale       Expected Outcomes  Short Term: Able to use RPE daily in rehab to express subjective intensity level;Long Term:  Able to use RPE to guide intensity level when exercising independently       Knowledge and understanding of Target Heart Rate Range (THRR)  Yes       Intervention  Provide education and explanation of THRR including how the numbers were predicted and where they are located for reference       Expected Outcomes  Short Term: Able to state/look  up THRR;Long Term: Able to use THRR to govern intensity when exercising independently;Short Term: Able to use daily as guideline for intensity in rehab       Able to check pulse independently  Yes       Intervention  Provide education and demonstration on how to check pulse in carotid and radial arteries.;Review the importance of being able to check your own pulse for safety during independent exercise       Expected Outcomes  Short Term: Able to explain why pulse checking is important during independent exercise;Long Term: Able to check pulse independently and accurately       Understanding of Exercise Prescription  Yes       Intervention  Provide education, explanation, and written materials on patient's individual exercise prescription       Expected Outcomes  Short Term: Able to explain program exercise prescription;Long Term: Able to explain home exercise prescription to exercise independently          Exercise Goals  Re-Evaluation :   Discharge Exercise Prescription (Final Exercise Prescription Changes): Exercise Prescription Changes - 07/06/17 1300      Response to Exercise   Blood Pressure (Admit)  122/60    Blood Pressure (Exercise)  130/56    Blood Pressure (Exit)  124/64    Heart Rate (Admit)  63 bpm    Heart Rate (Exercise)  103 bpm    Heart Rate (Exit)  62 bpm    Oxygen Saturation (Admit)  98 %    Oxygen Saturation (Exit)  98 %    Rating of Perceived Exertion (Exercise)  12    Symptoms  none    Comments  walk test results       Nutrition:  Target Goals: Understanding of nutrition guidelines, daily intake of sodium <1573m, cholesterol <203m calories 30% from fat and 7% or less from saturated fats, daily to have 5 or more servings of fruits and vegetables.  Biometrics: Pre Biometrics - 07/06/17 1408      Pre Biometrics   Height  5' 11"  (1.803 m)    Weight  202 lb (91.6 kg)    Waist Circumference  41 inches    Hip Circumference  38.5 inches    Waist to Hip Ratio  1.06 %    BMI (Calculated)  28.19    Single Leg Stand  19.01 seconds        Nutrition Therapy Plan and Nutrition Goals: Nutrition Therapy & Goals - 07/06/17 1330      Nutrition Therapy   RD appointment defered  Yes      Intervention Plan   Intervention  Prescribe, educate and counsel regarding individualized specific dietary modifications aiming towards targeted core components such as weight, hypertension, lipid management, diabetes, heart failure and other comorbidities.;Nutrition handout(s) given to patient.    Expected Outcomes  Short Term Goal: Understand basic principles of dietary content, such as calories, fat, sodium, cholesterol and nutrients.;Short Term Goal: A plan has been developed with personal nutrition goals set during dietitian appointment.;Long Term Goal: Adherence to prescribed nutrition plan.       Nutrition Discharge: Rate Your Plate Scores: Nutrition Assessments - 07/06/17 1331       MEDFICTS Scores   Pre Score  35       Nutrition Goals Re-Evaluation:   Nutrition Goals Discharge (Final Nutrition Goals Re-Evaluation):   Psychosocial: Target Goals: Acknowledge presence or absence of significant depression and/or stress, maximize coping skills, provide positive support system. Participant is able  to verbalize types and ability to use techniques and skills needed for reducing stress and depression.   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 He does not like big groups, loud noises, small rooms, or feeling crowded in any way.       Family Dynamics   Good Support System?  Yes reports his wife is supportive      Screening Interventions   Interventions  Yes;Encouraged to exercise;Program counselor consult    Expected Outcomes  Short Term goal: Utilizing psychosocial counselor, staff and physician to assist with identification of specific Stressors or current issues interfering with healing process. Setting desired goal for each stressor or current issue identified.;Long Term Goal: Stressors or current issues are controlled or eliminated.;Short Term goal: Identification and review with participant of any Quality of Life or Depression concerns found by scoring the questionnaire.;Long Term goal: The participant improves quality of Life and PHQ9 Scores as seen by post scores and/or verbalization of changes       Quality of Life Scores:  Quality of Life - 07/06/17 1334      Quality of Life Scores   Health/Function Pre  20.87 %    Socioeconomic Pre  21 %    Psych/Spiritual Pre  21 %    Family Pre  21 %    GLOBAL Pre  20.94 %       PHQ-9: Recent Review Flowsheet Data    Depression  screen [facility] 2/9 07/06/2017   Decreased Interest 0   Down, Depressed, Hopeless 0   PHQ - 2 Score 0   Altered sleeping 0   Tired, decreased energy 1   Change in appetite 0   Feeling bad or failure about yourself  0   Trouble concentrating 0   Moving slowly or fidgety/restless 0   Suicidal thoughts 0   PHQ-9 Score 1   Difficult doing work/chores Somewhat difficult     Interpretation of Total Score  Total Score Depression Severity:  1-4 = Minimal depression, 5-9 = Mild depression, 10-14 = Moderate depression, 15-19 = Moderately severe depression, 20-27 = Severe depression   Psychosocial Evaluation and Intervention:   Psychosocial Re-Evaluation:   Psychosocial Discharge (Final Psychosocial Re-Evaluation):   Vocational Rehabilitation: Provide vocational rehab assistance to qualifying candidates.   Vocational Rehab Evaluation & Intervention: Vocational Rehab - 07/06/17 1341      Initial Vocational Rehab Evaluation & Intervention   Assessment shows need for Vocational Rehabilitation  No       Education: Education Goals: Education classes will be provided on a variety of topics geared toward better understanding of heart health and risk factor modification. Participant will state understanding/return demonstration of topics presented as noted by education test scores.  Learning Barriers/Preferences: Learning Barriers/Preferences - 07/06/17 1339      Learning Barriers/Preferences   Learning Barriers  Hearing    Learning Preferences  Individual Instruction       Education Topics: General Nutrition Guidelines/Fats and Fiber: -Group instruction provided by verbal, written material, models and posters to present the general guidelines for heart healthy nutrition. Gives an explanation and review of dietary fats and fiber.   Controlling Sodium/Reading Food Labels: -Group verbal and written material supporting the discussion  of sodium use in heart healthy nutrition. Review and  explanation with models, verbal and written materials for utilization of the food label.   Exercise Physiology & Risk Factors: - Group verbal and written instruction with models to review the exercise physiology of the cardiovascular system and associated critical values. Details cardiovascular disease risk factors and the goals associated with each risk factor.   Aerobic Exercise & Resistance Training: - Gives group verbal and written discussion on the health impact of inactivity. On the components of aerobic and resistive training programs and the benefits of this training and how to safely progress through these programs.   Flexibility, Balance, General Exercise Guidelines: - Provides group verbal and written instruction on the benefits of flexibility and balance training programs. Provides general exercise guidelines with specific guidelines to those with heart or lung disease. Demonstration and skill practice provided.   Stress Management: - Provides group verbal and written instruction about the health risks of elevated stress, cause of high stress, and healthy ways to reduce stress.   Depression: - Provides group verbal and written instruction on the correlation between heart/lung disease and depressed mood, treatment options, and the stigmas associated with seeking treatment.   Anatomy & Physiology of the Heart: - Group verbal and written instruction and models provide basic cardiac anatomy and physiology, with the coronary electrical and arterial systems. Review of: AMI, Angina, Valve disease, Heart Failure, Cardiac Arrhythmia, Pacemakers, and the ICD.   Cardiac Procedures: - Group verbal and written instruction to review commonly prescribed medications for heart disease. Reviews the medication, class of the drug, and side effects. Includes the steps to properly store meds and maintain the prescription regimen. (beta blockers and nitrates)   Cardiac Medications I: - Group  verbal and written instruction to review commonly prescribed medications for heart disease. Reviews the medication, class of the drug, and side effects. Includes the steps to properly store meds and maintain the prescription regimen.   Cardiac Medications II: -Group verbal and written instruction to review commonly prescribed medications for heart disease. Reviews the medication, class of the drug, and side effects. (all other drug classes)    Go Sex-Intimacy & Heart Disease, Get SMART - Goal Setting: - Group verbal and written instruction through game format to discuss heart disease and the return to sexual intimacy. Provides group verbal and written material to discuss and apply goal setting through the application of the S.M.A.R.T. Method.   Other Matters of the Heart: - Provides group verbal, written materials and models to describe Heart Failure, Angina, Valve Disease, Peripheral Artery Disease, and Diabetes in the realm of heart disease. Includes description of the disease process and treatment options available to the cardiac patient.   Exercise & Equipment Safety: - Individual verbal instruction and demonstration of equipment use and safety with use of the equipment.   Cardiac Rehab from 07/06/2017 in [facility] Cardiac and Pulmonary Rehab  Date  07/06/17  Educator  [facility]  Instruction Review Code  1- Verbalizes Understanding      Infection Prevention: - Provides verbal and written material to individual with discussion of infection control including proper hand washing and proper equipment cleaning during exercise session.   Cardiac Rehab from 07/06/2017 in [facility] Cardiac and Pulmonary Rehab  Date  07/06/17  Educator  [facility]  Instruction Review Code  1- Verbalizes Understanding      Falls Prevention: - Provides verbal and written material to individual with discussion of falls prevention and safety.   Cardiac Rehab from  07/06/2017 in [facility] Cardiac and Pulmonary Rehab  Date  07/06/17   Educator  [facility]  Instruction Review Code  1- Verbalizes Understanding      Diabetes: - Individual verbal and written instruction to review signs/symptoms of diabetes, desired ranges of glucose level fasting, after meals and with exercise. Acknowledge that pre and post exercise glucose checks will be done for 3 sessions at entry of program.   Cardiac Rehab from 07/06/2017 in [facility] Cardiac and Pulmonary Rehab  Date  07/06/17  Educator  [facility]  Instruction Review Code  1- Verbalizes Understanding      Other: -Provides group and verbal instruction on various topics (see comments)    Knowledge Questionnaire Score: Knowledge Questionnaire Score - 07/06/17 1340      Knowledge Questionnaire Score   Pre Score  26/28 correct answers reviewed with Herbie Baltimore       Core Components/Risk Factors/Patient Goals at Admission: Personal Goals and Risk Factors at Admission - 07/06/17 1329      Core Components/Risk Factors/Patient Goals on Admission   Diabetes  Yes    Intervention  Provide education about signs/symptoms and action to take for hypo/hyperglycemia.;Provide education about proper nutrition, including hydration, and aerobic/resistive exercise prescription along with prescribed medications to achieve blood glucose in normal ranges: Fasting glucose 65-99 mg/dL    Expected Outcomes  Short Term: Participant verbalizes understanding of the signs/symptoms and immediate care of hyper/hypoglycemia, proper foot care and importance of medication, aerobic/resistive exercise and nutrition plan for blood glucose control.;Long Term: Attainment of HbA1C < 7%.    Hypertension  Yes    Intervention  Provide education on lifestyle modifcations including regular physical activity/exercise, weight management, moderate sodium restriction and increased consumption of fresh fruit, vegetables, and low fat dairy, alcohol moderation, and smoking cessation.;Monitor prescription use compliance.    Expected Outcomes  Short Term:  Continued assessment and intervention until BP is < 140/66m HG in hypertensive participants. < 130/851mHG in hypertensive participants with diabetes, heart failure or chronic kidney disease.;Long Term: Maintenance of blood pressure at goal levels.    Lipids  Yes    Intervention  Provide education and support for participant on nutrition & aerobic/resistive exercise along with prescribed medications to achieve LDL <706mHDL >57m65m  Expected Outcomes  Short Term: Participant states understanding of desired cholesterol values and is compliant with medications prescribed. Participant is following exercise prescription and nutrition guidelines.;Long Term: Cholesterol controlled with medications as prescribed, with individualized exercise RX and with personalized nutrition plan. Value goals: LDL < 70mg66mL > 40 mg.    Stress  Yes RoberDavinderers from severe PTSD.     Intervention  Offer individual and/or small group education and counseling on adjustment to heart disease, stress management and health-related lifestyle change. Teach and support self-help strategies.;Refer participants experiencing significant psychosocial distress to appropriate mental health specialists for further evaluation and treatment. When possible, include family members and significant others in education/counseling sessions.    Expected Outcomes  Short Term: Participant demonstrates changes in health-related behavior, relaxation and other stress management skills, ability to obtain effective social support, and compliance with psychotropic medications if prescribed.;Long Term: Emotional wellbeing is indicated by absence of clinically significant psychosocial distress or social isolation.       Core Components/Risk Factors/Patient Goals Review:    Core Components/Risk Factors/Patient Goals at Discharge (Final Review):    ITP Comments: ITP Comments    Row Name 07/06/17 1215           ITP  Comments  Med Review completed.  Initial ITP created. Diagnosis can be found in Media tab from [place] 04/17/17          Comments: Initial ITP

## 2017-07-06 NOTE — Patient Instructions (Signed)
Patient Instructions  Patient Details  Name: Jerry [name] MRN: [id number] Date of Birth: April 28, 1951 Referring Provider:  [place]  Below are the personal goals you chose as well as exercise and nutrition goals. Our goal is to help you keep on track towards obtaining and maintaining your goals. We will be discussing your progress on these goals with you throughout the program.  Initial Exercise Prescription: Initial Exercise Prescription - 07/06/17 1300      Date of Initial Exercise RX and Referring Provider   Date  07/06/17    Referring Provider  [name] MD ([facility])      Treadmill   MPH  2.5    Grade  1    Minutes  15    METs  3.26      Bike   Level  1    Watts  37    Minutes  15    METs  3.2      Biostep-RELP   Level  3    SPM  50    Minutes  15    METs  3      Prescription Details   Frequency (times per week)  3    Duration  Progress to 45 minutes of aerobic exercise without signs/symptoms of physical distress      Intensity   THRR 40-80% of Max Heartrate  98-135    Ratings of Perceived Exertion  11-13    Perceived Dyspnea  0-4      Progression   Progression  Continue to progress workloads to maintain intensity without signs/symptoms of physical distress.      Resistance Training   Training Prescription  Yes    Weight  3 lbs    Reps  10-15       Exercise Goals: Frequency: Be able to perform aerobic exercise three times per week working toward 3-5 days per week.  Intensity: Work with a perceived exertion of 11 (fairly light) - 15 (hard) as tolerated. Follow your new exercise prescription and watch for changes in prescription as you progress with the program. Changes will be reviewed with you when they are made.  Duration: You should be able to do 30 minutes of continuous aerobic exercise in addition to a 5 minute warm-up and a 5 minute cool-down routine.  Nutrition Goals: Your personal nutrition goals will be established when you  do your nutrition analysis with the dietician.  The following are nutrition guidelines to follow: Cholesterol < 258m/day Sodium < 15042mday Fiber: Men over 50 yrs - 30 grams per day  Personal Goals: Personal Goals and Risk Factors at Admission - 07/06/17 1329      Core Components/Risk Factors/Patient Goals on Admission   Diabetes  Yes    Intervention  Provide education about signs/symptoms and action to take for hypo/hyperglycemia.;Provide education about proper nutrition, including hydration, and aerobic/resistive exercise prescription along with prescribed medications to achieve blood glucose in normal ranges: Fasting glucose 65-99 mg/dL    Expected Outcomes  Short Term: Participant verbalizes understanding of the signs/symptoms and immediate care of hyper/hypoglycemia, proper foot care and importance of medication, aerobic/resistive exercise and nutrition plan for blood glucose control.;Long Term: Attainment of HbA1C < 7%.    Hypertension  Yes    Intervention  Provide education on lifestyle modifcations including regular physical activity/exercise, weight management, moderate sodium restriction and increased consumption of fresh fruit, vegetables, and low fat dairy, alcohol moderation, and smoking cessation.;Monitor prescription use compliance.    Expected  Outcomes  Short Term: Continued assessment and intervention until BP is < 140/35m HG in hypertensive participants. < 130/844mHG in hypertensive participants with diabetes, heart failure or chronic kidney disease.;Long Term: Maintenance of blood pressure at goal levels.    Lipids  Yes    Intervention  Provide education and support for participant on nutrition & aerobic/resistive exercise along with prescribed medications to achieve LDL <7064mHDL >56m11m  Expected Outcomes  Short Term: Participant states understanding of desired cholesterol values and is compliant with medications prescribed. Participant is following exercise prescription  and nutrition guidelines.;Long Term: Cholesterol controlled with medications as prescribed, with individualized exercise RX and with personalized nutrition plan. Value goals: LDL < 70mg73mL > 40 mg.    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     Intervention  Offer individual and/or small group education and counseling on adjustment to heart disease, stress management and health-related lifestyle change. Teach and support self-help strategies.;Refer participants experiencing significant psychosocial distress to appropriate mental health specialists for further evaluation and treatment. When possible, include family members and significant others in education/counseling sessions.    Expected Outcomes  Short Term: Participant demonstrates changes in health-related behavior, relaxation and other stress management skills, ability to obtain effective social support, and compliance with psychotropic medications if prescribed.;Long Term: Emotional wellbeing is indicated by absence of clinically significant psychosocial distress or social isolation.       Tobacco Use Initial Evaluation: Social History   Tobacco Use  Smoking Status Never Smoker  Smokeless Tobacco Never Used    Exercise Goals and Review: Exercise Goals    Row Name 07/06/17 1407             Exercise Goals   Increase Physical Activity  Yes       Intervention  Provide advice, education, support and counseling about physical activity/exercise needs.;Develop an individualized exercise prescription for aerobic and resistive training based on initial evaluation findings, risk stratification, comorbidities and participant's personal goals.       Expected Outcomes  Achievement of increased cardiorespiratory fitness and enhanced flexibility, muscular endurance and strength shown through measurements of functional capacity and personal statement of participant.       Increase Strength and Stamina  Yes       Intervention  Provide advice,  education, support and counseling about physical activity/exercise needs.;Develop an individualized exercise prescription for aerobic and resistive training based on initial evaluation findings, risk stratification, comorbidities and participant's personal goals.       Expected Outcomes  Achievement of increased cardiorespiratory fitness and enhanced flexibility, muscular endurance and strength shown through measurements of functional capacity and personal statement of participant.       Able to understand and use rate of perceived exertion (RPE) scale  Yes       Intervention  Provide education and explanation on how to use RPE scale       Expected Outcomes  Short Term: Able to use RPE daily in rehab to express subjective intensity level;Long Term:  Able to use RPE to guide intensity level when exercising independently       Knowledge and understanding of Target Heart Rate Range (THRR)  Yes       Intervention  Provide education and explanation of THRR including how the numbers were predicted and where they are located for reference       Expected Outcomes  Short Term: Able to state/look up THRR;Long Term: Able to use THRR to govern intensity  when exercising independently;Short Term: Able to use daily as guideline for intensity in rehab       Able to check pulse independently  Yes       Intervention  Provide education and demonstration on how to check pulse in carotid and radial arteries.;Review the importance of being able to check your own pulse for safety during independent exercise       Expected Outcomes  Short Term: Able to explain why pulse checking is important during independent exercise;Long Term: Able to check pulse independently and accurately       Understanding of Exercise Prescription  Yes       Intervention  Provide education, explanation, and written materials on patient's individual exercise prescription       Expected Outcomes  Short Term: Able to explain program exercise  prescription;Long Term: Able to explain home exercise prescription to exercise independently          Copy of goals given to participant.

## 2017-07-08 ENCOUNTER — Encounter: Payer: Non-veteran care | Attending: Adult Health | Admitting: *Deleted

## 2017-07-08 ENCOUNTER — Encounter: Payer: Self-pay | Admitting: *Deleted

## 2017-07-08 DIAGNOSIS — R079 Chest pain, unspecified: Secondary | ICD-10-CM | POA: Insufficient documentation

## 2017-07-08 DIAGNOSIS — Z955 Presence of coronary angioplasty implant and graft: Secondary | ICD-10-CM | POA: Diagnosis present

## 2017-07-08 LAB — GLUCOSE, CAPILLARY
GLUCOSE-CAPILLARY: 145 mg/dL — AB (ref 65–99)
Glucose-Capillary: 151 mg/dL — ABNORMAL HIGH (ref 65–99)

## 2017-07-08 NOTE — Progress Notes (Signed)
Cardiac Individual Treatment Plan  Patient Details  Name: Jerry [name] MRN: [id number] Date of Birth: Oct 06, 1950 Referring Provider:     Cardiac Rehab from 07/06/2017 in [facility] Cardiac and Pulmonary Rehab  Referring Provider  [name] MD ([place])      Initial Encounter Date:    Cardiac Rehab from 07/06/2017 in [facility] Cardiac and Pulmonary Rehab  Date  07/06/17  Referring Provider  [name] MD ([place])      Visit Diagnosis: Status post coronary artery stent placement  Patient's Home Medications on Admission:  Current Outpatient Medications:  .  acetaminophen (TYLENOL) 325 MG tablet, Take 975 mg by mouth 3 (three) times daily as needed for mild pain or fever., Disp: , Rfl:  .  alprostadil (MUSE) 1000 MCG pellet, 1,000 mcg by Transurethral route See admin instructions. Every 96 hours as needed for erection, Disp: , Rfl:  .  aspirin 81 MG chewable tablet, Chew 81 mg by mouth daily., Disp: , Rfl:  .  carvedilol (COREG) 6.25 MG tablet, Take 6.25 mg by mouth 2 (two) times daily with a meal., Disp: , Rfl:  .  clopidogrel (PLAVIX) 75 MG tablet, Take 75 mg by mouth daily., Disp: , Rfl:  .  finasteride (PROSCAR) 5 MG tablet, Take 5 mg by mouth daily., Disp: , Rfl:  .  gabapentin (NEURONTIN) 300 MG capsule, Take 300 mg by mouth 2 (two) times daily., Disp: , Rfl:  .  glipiZIDE (GLUCOTROL) 5 MG tablet, Take 1 tablet (5 mg total) by mouth 2 (two) times daily before a meal., Disp: 60 tablet, Rfl: 1 .  isosorbide dinitrate (ISORDIL) 30 MG tablet, 90 mg 1000 daily, Disp: 90 tablet, Rfl: 0 .  isosorbide dinitrate (ISORDIL) 30 MG tablet, 30 mg at 1800 in the evening, Disp: 30 tablet, Rfl: 0 .  lisinopril (PRINIVIL,ZESTRIL) 5 MG tablet, Take 5 mg by mouth daily. , Disp: , Rfl:  .  magnesium citrate SOLN, Take 296 mLs (1 Bottle total) by mouth once as needed for severe constipation. (Patient not taking: Reported on 07/06/2017), Disp: 195 mL, Rfl: 1 .  magnesium oxide (MAG-OX) 400  MG tablet, Take 400 mg by mouth every evening. , Disp: , Rfl:  .  metFORMIN (GLUCOPHAGE) 1000 MG tablet, Take 500 mg by mouth 2 (two) times daily with a meal. , Disp: , Rfl:  .  nitroGLYCERIN (NITROSTAT) 0.4 MG SL tablet, Place 0.4 mg under the tongue every 5 (five) minutes as needed for chest pain., Disp: , Rfl:  .  omeprazole (PRILOSEC) 20 MG capsule, Take 20 mg by mouth 2 (two) times daily., Disp: , Rfl:  .  pantoprazole (PROTONIX) 40 MG tablet, Take 1 tablet (40 mg total) by mouth daily., Disp: 30 tablet, Rfl: 0 .  rifaximin (XIFAXAN) 550 MG TABS tablet, Take 550 mg by mouth 2 (two) times daily., Disp: , Rfl:  .  rosuvastatin (CRESTOR) 20 MG tablet, Take 1 tablet (20 mg total) by mouth daily. (Patient taking differently: Take 10 mg by mouth daily. ), Disp: 30 tablet, Rfl: 0 .  senna-docusate (SENOKOT-S) 8.6-50 MG tablet, Take 1 tablet by mouth at bedtime as needed for mild constipation. (Patient not taking: Reported on 07/06/2017), Disp: 60 tablet, Rfl: 1 .  simethicone (MYLICON) 80 MG chewable tablet, Chew 160 mg by mouth 2 (two) times daily. , Disp: , Rfl:   Past Medical History: Past Medical History:  Diagnosis Date  . Coronary artery disease   . Diabetes mellitus without complication ([place])   .  Hypercholesteremia   . Hypertension   . Liver lesion   . Primary cancer of kidney or ureter    KIDNEY CANCER    Tobacco Use: Social History   Tobacco Use  Smoking Status Never Smoker  Smokeless Tobacco Never Used    Labs: Recent Review Flowsheet Data    Labs for ITP Cardiac and Pulmonary Rehab Latest Ref Rng & Units 01/21/2016 01/26/2016 01/28/2016 03/20/2017 03/21/2017   Cholestrol 0 - 200 mg/dL - - - 134 129   LDLCALC 0 - 99 mg/dL - - - 71 75   HDL >40 mg/dL - - - 26(L) 27(L)   Trlycerides <150 mg/dL - 94 79 185(H) 134   Hemoglobin A1c 4.8 - 5.6 % 8.9(H) - - - -       Exercise Target Goals:    Exercise Program Goal: Individual exercise prescription set with THRR, safety &  activity barriers. Participant demonstrates ability to understand and report RPE using BORG scale, to self-measure pulse accurately, and to acknowledge the importance of the exercise prescription.  Exercise Prescription Goal: Starting with aerobic activity 30 plus minutes a day, 3 days per week for initial exercise prescription. Provide home exercise prescription and guidelines that participant acknowledges understanding prior to discharge.  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 HR  103 bpm     Max Ex. BP  130/56     2 Minute Post BP  124/64        Oxygen Initial Assessment:   Oxygen Re-Evaluation:   Oxygen Discharge (Final Oxygen Re-Evaluation):   Initial Exercise Prescription: Initial Exercise Prescription - 07/06/17 1300      Date of Initial Exercise RX and Referring Provider   Date  07/06/17    Referring Provider  [name] MD ([place])      Treadmill   MPH  2.5    Grade  1    Minutes  15    METs  3.26      Bike   Level  1    Watts  37    Minutes  15    METs  3.2      Biostep-RELP   Level  3    SPM  50    Minutes  15    METs  3      Prescription Details  Frequency (times per week)  3     Duration  Progress to 45 minutes of aerobic exercise without signs/symptoms of physical distress      Intensity   THRR 40-80% of Max Heartrate  98-135    Ratings of Perceived Exertion  11-13    Perceived Dyspnea  0-4      Progression   Progression  Continue to progress workloads to maintain intensity without signs/symptoms of physical distress.      Resistance Training   Training Prescription  Yes    Weight  3 lbs    Reps  10-15       Perform Capillary Blood Glucose checks as needed.  Exercise Prescription Changes: Exercise Prescription Changes    Row Name 07/06/17 1300             Response to Exercise   Blood Pressure (Admit)  122/60       Blood Pressure (Exercise)  130/56       Blood Pressure (Exit)  124/64       Heart Rate (Admit)  63 bpm       Heart Rate (Exercise)  103 bpm       Heart Rate (Exit)  62 bpm       Oxygen Saturation (Admit)  98 %       Oxygen Saturation (Exit)  98 %       Rating of Perceived Exertion (Exercise)  12       Symptoms  none       Comments  walk test results          Exercise Comments:   Exercise Goals and Review: Exercise Goals    Row Name 07/06/17 1407             Exercise Goals   Increase Physical Activity  Yes       Intervention  Provide advice, education, support and counseling about physical activity/exercise needs.;Develop an individualized exercise prescription for aerobic and resistive training based on initial evaluation findings, risk stratification, comorbidities and participant's personal goals.       Expected Outcomes  Achievement of increased cardiorespiratory fitness and enhanced flexibility, muscular endurance and strength shown through measurements of functional capacity and personal statement of participant.       Increase Strength and Stamina  Yes       Intervention  Provide advice, education, support and counseling about physical activity/exercise needs.;Develop an individualized exercise prescription for  aerobic and resistive training based on initial evaluation findings, risk stratification, comorbidities and participant's personal goals.       Expected Outcomes  Achievement of increased cardiorespiratory fitness and enhanced flexibility, muscular endurance and strength shown through measurements of functional capacity and personal statement of participant.       Able to understand and use rate of perceived exertion (RPE) scale  Yes       Intervention  Provide education and explanation on how to use RPE scale       Expected Outcomes  Short Term: Able to use RPE daily in rehab to express subjective intensity level;Long Term:  Able to use RPE to guide intensity level when exercising independently       Knowledge and understanding of Target Heart Rate Range (THRR)  Yes       Intervention  Provide education and explanation of THRR including how the numbers were predicted and where they are located for reference       Expected Outcomes  Short Term: Able to state/look  up THRR;Long Term: Able to use THRR to govern intensity when exercising independently;Short Term: Able to use daily as guideline for intensity in rehab       Able to check pulse independently  Yes       Intervention  Provide education and demonstration on how to check pulse in carotid and radial arteries.;Review the importance of being able to check your own pulse for safety during independent exercise       Expected Outcomes  Short Term: Able to explain why pulse checking is important during independent exercise;Long Term: Able to check pulse independently and accurately       Understanding of Exercise Prescription  Yes       Intervention  Provide education, explanation, and written materials on patient's individual exercise prescription       Expected Outcomes  Short Term: Able to explain program exercise prescription;Long Term: Able to explain home exercise prescription to exercise independently          Exercise Goals Re-Evaluation  :   Discharge Exercise Prescription (Final Exercise Prescription Changes): Exercise Prescription Changes - 07/06/17 1300      Response to Exercise   Blood Pressure (Admit)  122/60    Blood Pressure (Exercise)  130/56    Blood Pressure (Exit)  124/64    Heart Rate (Admit)  63 bpm    Heart Rate (Exercise)  103 bpm    Heart Rate (Exit)  62 bpm    Oxygen Saturation (Admit)  98 %    Oxygen Saturation (Exit)  98 %    Rating of Perceived Exertion (Exercise)  12    Symptoms  none    Comments  walk test results       Nutrition:  Target Goals: Understanding of nutrition guidelines, daily intake of sodium <158m, cholesterol <2063m calories 30% from fat and 7% or less from saturated fats, daily to have 5 or more servings of fruits and vegetables.  Biometrics: Pre Biometrics - 07/06/17 1408      Pre Biometrics   Height  5' 11"  (1.803 m)    Weight  202 lb (91.6 kg)    Waist Circumference  41 inches    Hip Circumference  38.5 inches    Waist to Hip Ratio  1.06 %    BMI (Calculated)  28.19    Single Leg Stand  19.01 seconds        Nutrition Therapy Plan and Nutrition Goals: Nutrition Therapy & Goals - 07/06/17 1330      Nutrition Therapy   RD appointment defered  Yes      Intervention Plan   Intervention  Prescribe, educate and counsel regarding individualized specific dietary modifications aiming towards targeted core components such as weight, hypertension, lipid management, diabetes, heart failure and other comorbidities.;Nutrition handout(s) given to patient.    Expected Outcomes  Short Term Goal: Understand basic principles of dietary content, such as calories, fat, sodium, cholesterol and nutrients.;Short Term Goal: A plan has been developed with personal nutrition goals set during dietitian appointment.;Long Term Goal: Adherence to prescribed nutrition plan.       Nutrition Discharge: Rate Your Plate Scores: Nutrition Assessments - 07/06/17 1331      MEDFICTS Scores    Pre Score  35       Nutrition Goals Re-Evaluation:   Nutrition Goals Discharge (Final Nutrition Goals Re-Evaluation):   Psychosocial: Target Goals: Acknowledge presence or absence of significant depression and/or stress, maximize coping skills, provide positive support system. Participant is able  to verbalize types and ability to use techniques and skills needed for reducing stress and depression.   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 He does not like big groups, loud noises, small rooms, or feeling crowded in any way.       Family Dynamics   Good Support System?  Yes reports his wife is supportive      Screening Interventions   Interventions  Yes;Encouraged to exercise;Program counselor consult    Expected Outcomes  Short Term goal: Utilizing psychosocial counselor, staff and physician to assist with identification of specific Stressors or current issues interfering with healing process. Setting desired goal for each stressor or current issue identified.;Long Term Goal: Stressors or current issues are controlled or eliminated.;Short Term goal: Identification and review with participant of any Quality of Life or Depression concerns found by scoring the questionnaire.;Long Term goal: The participant improves quality of Life and PHQ9 Scores as seen by post scores and/or verbalization of changes       Quality of Life Scores:  Quality of Life - 07/06/17 1334      Quality of Life Scores   Health/Function Pre  20.87 %    Socioeconomic Pre  21 %    Psych/Spiritual Pre  21 %    Family Pre  21 %    GLOBAL Pre  20.94 %       PHQ-9: Recent Review Flowsheet Data    Depression screen [facility] 2/9  07/06/2017   Decreased Interest 0   Down, Depressed, Hopeless 0   PHQ - 2 Score 0   Altered sleeping 0   Tired, decreased energy 1   Change in appetite 0   Feeling bad or failure about yourself  0   Trouble concentrating 0   Moving slowly or fidgety/restless 0   Suicidal thoughts 0   PHQ-9 Score 1   Difficult doing work/chores Somewhat difficult     Interpretation of Total Score  Total Score Depression Severity:  1-4 = Minimal depression, 5-9 = Mild depression, 10-14 = Moderate depression, 15-19 = Moderately severe depression, 20-27 = Severe depression   Psychosocial Evaluation and Intervention:   Psychosocial Re-Evaluation:   Psychosocial Discharge (Final Psychosocial Re-Evaluation):   Vocational Rehabilitation: Provide vocational rehab assistance to qualifying candidates.   Vocational Rehab Evaluation & Intervention: Vocational Rehab - 07/06/17 1341      Initial Vocational Rehab Evaluation & Intervention   Assessment shows need for Vocational Rehabilitation  No       Education: Education Goals: Education classes will be provided on a variety of topics geared toward better understanding of heart health and risk factor modification. Participant will state understanding/return demonstration of topics presented as noted by education test scores.  Learning Barriers/Preferences: Learning Barriers/Preferences - 07/06/17 1339      Learning Barriers/Preferences   Learning Barriers  Hearing    Learning Preferences  Individual Instruction       Education Topics: General Nutrition Guidelines/Fats and Fiber: -Group instruction provided by verbal, written material, models and posters to present the general guidelines for heart healthy nutrition. Gives an explanation and review of dietary fats and fiber.   Controlling Sodium/Reading Food Labels: -Group verbal and written material supporting the discussion  of sodium use in heart healthy nutrition. Review and explanation  with models, verbal and written materials for utilization of the food label.   Exercise Physiology & Risk Factors: - Group verbal and written instruction with models to review the exercise physiology of the cardiovascular system and associated critical values. Details cardiovascular disease risk factors and the goals associated with each risk factor.   Aerobic Exercise & Resistance Training: - Gives group verbal and written discussion on the health impact of inactivity. On the components of aerobic and resistive training programs and the benefits of this training and how to safely progress through these programs.   Flexibility, Balance, General Exercise Guidelines: - Provides group verbal and written instruction on the benefits of flexibility and balance training programs. Provides general exercise guidelines with specific guidelines to those with heart or lung disease. Demonstration and skill practice provided.   Stress Management: - Provides group verbal and written instruction about the health risks of elevated stress, cause of high stress, and healthy ways to reduce stress.   Depression: - Provides group verbal and written instruction on the correlation between heart/lung disease and depressed mood, treatment options, and the stigmas associated with seeking treatment.   Anatomy & Physiology of the Heart: - Group verbal and written instruction and models provide basic cardiac anatomy and physiology, with the coronary electrical and arterial systems. Review of: AMI, Angina, Valve disease, Heart Failure, Cardiac Arrhythmia, Pacemakers, and the ICD.   Cardiac Procedures: - Group verbal and written instruction to review commonly prescribed medications for heart disease. Reviews the medication, class of the drug, and side effects. Includes the steps to properly store meds and maintain the prescription regimen. (beta blockers and nitrates)   Cardiac Medications I: - Group verbal and  written instruction to review commonly prescribed medications for heart disease. Reviews the medication, class of the drug, and side effects. Includes the steps to properly store meds and maintain the prescription regimen.   Cardiac Medications II: -Group verbal and written instruction to review commonly prescribed medications for heart disease. Reviews the medication, class of the drug, and side effects. (all other drug classes)    Go Sex-Intimacy & Heart Disease, Get SMART - Goal Setting: - Group verbal and written instruction through game format to discuss heart disease and the return to sexual intimacy. Provides group verbal and written material to discuss and apply goal setting through the application of the S.M.A.R.T. Method.   Other Matters of the Heart: - Provides group verbal, written materials and models to describe Heart Failure, Angina, Valve Disease, Peripheral Artery Disease, and Diabetes in the realm of heart disease. Includes description of the disease process and treatment options available to the cardiac patient.   Exercise & Equipment Safety: - Individual verbal instruction and demonstration of equipment use and safety with use of the equipment.   Cardiac Rehab from 07/06/2017 in [facility] Cardiac and Pulmonary Rehab  Date  07/06/17  Educator  [facility]  Instruction Review Code  1- Verbalizes Understanding      Infection Prevention: - Provides verbal and written material to individual with discussion of infection control including proper hand washing and proper equipment cleaning during exercise session.   Cardiac Rehab from 07/06/2017 in [facility] Cardiac and Pulmonary Rehab  Date  07/06/17  Educator  [facility]  Instruction Review Code  1- Verbalizes Understanding      Falls Prevention: - Provides verbal and written material to individual with discussion of falls prevention and safety.   Cardiac Rehab from  07/06/2017 in [facility] Cardiac and Pulmonary Rehab  Date  07/06/17  Educator  [facility]   Instruction Review Code  1- Verbalizes Understanding      Diabetes: - Individual verbal and written instruction to review signs/symptoms of diabetes, desired ranges of glucose level fasting, after meals and with exercise. Acknowledge that pre and post exercise glucose checks will be done for 3 sessions at entry of program.   Cardiac Rehab from 07/06/2017 in [facility] Cardiac and Pulmonary Rehab  Date  07/06/17  Educator  [facility]  Instruction Review Code  1- Verbalizes Understanding      Other: -Provides group and verbal instruction on various topics (see comments)    Knowledge Questionnaire Score: Knowledge Questionnaire Score - 07/06/17 1340      Knowledge Questionnaire Score   Pre Score  26/28 correct answers reviewed with Herbie Baltimore       Core Components/Risk Factors/Patient Goals at Admission: Personal Goals and Risk Factors at Admission - 07/06/17 1329      Core Components/Risk Factors/Patient Goals on Admission   Diabetes  Yes    Intervention  Provide education about signs/symptoms and action to take for hypo/hyperglycemia.;Provide education about proper nutrition, including hydration, and aerobic/resistive exercise prescription along with prescribed medications to achieve blood glucose in normal ranges: Fasting glucose 65-99 mg/dL    Expected Outcomes  Short Term: Participant verbalizes understanding of the signs/symptoms and immediate care of hyper/hypoglycemia, proper foot care and importance of medication, aerobic/resistive exercise and nutrition plan for blood glucose control.;Long Term: Attainment of HbA1C < 7%.    Hypertension  Yes    Intervention  Provide education on lifestyle modifcations including regular physical activity/exercise, weight management, moderate sodium restriction and increased consumption of fresh fruit, vegetables, and low fat dairy, alcohol moderation, and smoking cessation.;Monitor prescription use compliance.    Expected Outcomes  Short Term: Continued  assessment and intervention until BP is < 140/24m HG in hypertensive participants. < 130/875mHG in hypertensive participants with diabetes, heart failure or chronic kidney disease.;Long Term: Maintenance of blood pressure at goal levels.    Lipids  Yes    Intervention  Provide education and support for participant on nutrition & aerobic/resistive exercise along with prescribed medications to achieve LDL <7049mHDL >81m25m  Expected Outcomes  Short Term: Participant states understanding of desired cholesterol values and is compliant with medications prescribed. Participant is following exercise prescription and nutrition guidelines.;Long Term: Cholesterol controlled with medications as prescribed, with individualized exercise RX and with personalized nutrition plan. Value goals: LDL < 70mg36mL > 40 mg.    Stress  Yes RoberArinzeers from severe PTSD.     Intervention  Offer individual and/or small group education and counseling on adjustment to heart disease, stress management and health-related lifestyle change. Teach and support self-help strategies.;Refer participants experiencing significant psychosocial distress to appropriate mental health specialists for further evaluation and treatment. When possible, include family members and significant others in education/counseling sessions.    Expected Outcomes  Short Term: Participant demonstrates changes in health-related behavior, relaxation and other stress management skills, ability to obtain effective social support, and compliance with psychotropic medications if prescribed.;Long Term: Emotional wellbeing is indicated by absence of clinically significant psychosocial distress or social isolation.       Core Components/Risk Factors/Patient Goals Review:    Core Components/Risk Factors/Patient Goals at Discharge (Final Review):    ITP Comments: ITP Comments    Row Name 07/06/17 1215 07/08/17 1042         ITP  Comments  Med Review completed.  Initial ITP created. Diagnosis can be found in Media tab from [place] 04/17/17  30 day review. Continue with ITP unless directed changes per Medical Director review.  New to program         Comments:

## 2017-07-08 NOTE — Progress Notes (Signed)
Daily Session Note  Patient Details  Name: Jerry [name] MRN: [id number] Date of Birth: December 14, 1950 Referring Provider:     Cardiac Rehab from 07/06/2017 in [facility] Cardiac and Pulmonary Rehab  Referring Provider  [name] MD ([place])      Encounter Date: 07/08/2017  Check In: Session Check In - 07/08/17 1655      Check-In   Location  [facility]-Cardiac & Pulmonary Rehab    Staff Present  Renita Papa, RN Vickki Hearing, BA, ACSM CEP, Exercise Physiologist;Carroll Enterkin, RN, BSN    Supervising physician immediately available to respond to emergencies  See telemetry face sheet for immediately available ER MD    Medication changes reported      No    Fall or balance concerns reported     No    Tobacco Cessation  No Change never smoked    Warm-up and Cool-down  Performed on first and last piece of equipment    Resistance Training Performed  Yes    VAD Patient?  No      Pain Assessment   Currently in Pain?  No/denies          Social History   Tobacco Use  Smoking Status Never Smoker  Smokeless Tobacco Never Used    Goals Met:  Independence with exercise equipment Exercise tolerated well No report of cardiac concerns or symptoms Strength training completed today  Goals Unmet:  Not Applicable  Comments: First full day of exercise!  Patient was oriented to gym and equipment including functions, settings, policies, and procedures.  Patient's individual exercise prescription and treatment plan were reviewed.  All starting workloads were established based on the results of the 6 minute walk test done at initial orientation visit.  The plan for exercise progression was also introduced and progression will be customized based on patient's performance and goals.    Dr. Emily Filbert is Medical Director for [place] and [facility].

## 2017-07-09 DIAGNOSIS — R079 Chest pain, unspecified: Secondary | ICD-10-CM | POA: Diagnosis not present

## 2017-07-09 DIAGNOSIS — Z955 Presence of coronary angioplasty implant and graft: Secondary | ICD-10-CM

## 2017-07-09 LAB — GLUCOSE, CAPILLARY
Glucose-Capillary: 118 mg/dL — ABNORMAL HIGH (ref 65–99)
Glucose-Capillary: 64 mg/dL — ABNORMAL LOW (ref 65–99)
Glucose-Capillary: 66 mg/dL (ref 65–99)

## 2017-07-09 NOTE — Progress Notes (Signed)
Daily Session Note  Patient Details  Name: QUAME SPRATLIN MRN: [id number] Date of Birth: [birth year] Referring Provider:     Cardiac Rehab from 07/06/2017 in [facility] Cardiac and Pulmonary Rehab  Referring Provider  [name] MD ([place])      Encounter Date: 07/09/2017  Check In: Session Check In - 07/09/17 1621      Check-In   Location  [facility]    Staff Present  Justin Mend RCP,RRT,BSRT;Meredith Sherryll Burger, RN Vickki Hearing, BA, ACSM CEP, Exercise Physiologist    Supervising physician immediately available to respond to emergencies  See telemetry face sheet for immediately available ER MD    Medication changes reported      No    Fall or balance concerns reported     No    Warm-up and Cool-down  Performed on first and last piece of equipment    Resistance Training Performed  Yes    VAD Patient?  No      Pain Assessment   Currently in Pain?  No/denies          Social History   Tobacco Use  Smoking Status Never Smoker  Smokeless Tobacco Never Used    Goals Met:  Independence with exercise equipment Exercise tolerated well No report of cardiac concerns or symptoms Strength training completed today  Goals Unmet:  Not Applicable  Comments: Pt able to follow exercise prescription today without complaint.  Will continue to monitor for progression.   Dr. Emily Filbert is Medical Director for [place] and [facility].

## 2017-07-15 DIAGNOSIS — Z955 Presence of coronary angioplasty implant and graft: Secondary | ICD-10-CM

## 2017-07-15 DIAGNOSIS — R079 Chest pain, unspecified: Secondary | ICD-10-CM | POA: Diagnosis not present

## 2017-07-15 LAB — GLUCOSE, CAPILLARY
GLUCOSE-CAPILLARY: 153 mg/dL — AB (ref 65–99)
Glucose-Capillary: 89 mg/dL (ref 65–99)

## 2017-07-15 NOTE — Progress Notes (Signed)
Daily Session Note  Patient Details  Name: RAD GRAMLING MRN: [id number] Date of Birth: 05-08-1951 Referring Provider:     Cardiac Rehab from 07/06/2017 in [facility] Cardiac and Pulmonary Rehab  Referring Provider  [name] MD ([place])      Encounter Date: 07/15/2017  Check In: Session Check In - 07/15/17 1733      Check-In   Location  [facility]Cardiac [facility] Re[facility]    Staff Present  Renita Papa, RN Vickki Hearing, BA, ACSM CEP, Exercise Physiologist;Carroll Enterkin, RN, BSN    Supervising physician immediately available to respond to emergencies  See telemetry face sheet for immediately available ER MD    Medication changes reported      No    Fall or balance concerns reported     No    Warm-up and Cool-down  Performed on first and last piece of equipment    Resistance Training Performed  Yes    VAD Patient?  No      Pain Assessment   Currently in Pain?  No/denies    Multiple Pain Sites  No          Social History   Tobacco Use  Smoking Status Never Smoker  Smokeless Tobacco Never Used    Goals Met:  Independence with exercise equipment Exercise tolerated well No report of cardiac concerns or symptoms Strength training completed today  Goals Unmet:  Not Applicable  Comments: Pt able to follow exercise prescription today without complaint.  Will continue to monitor for progression.    Dr. Emily Filbert is Medical Director for [place] and [facility].

## 2017-07-16 DIAGNOSIS — R079 Chest pain, unspecified: Secondary | ICD-10-CM | POA: Diagnosis not present

## 2017-07-16 DIAGNOSIS — Z955 Presence of coronary angioplasty implant and graft: Secondary | ICD-10-CM

## 2017-07-16 LAB — GLUCOSE, CAPILLARY
GLUCOSE-CAPILLARY: 81 mg/dL (ref 65–99)
Glucose-Capillary: 134 mg/dL — ABNORMAL HIGH (ref 65–99)

## 2017-07-16 NOTE — Progress Notes (Signed)
Daily Session Note  Patient Details  Name: DWYNE HASEGAWA MRN: [id number] Date of Birth: 10/18/1950 Referring Provider:     Cardiac Rehab from 07/06/2017 in [facility] Cardiac and Pulmonary Rehab  Referring Provider  [name] MD ([place])      Encounter Date: 07/16/2017  Check In: Session Check In - 07/16/17 1645      Check-In   Location  [facility] Re[facility]    Staff Present  Renita Papa, RN Moises Blood, BS, ACSM CEP, Exercise Physiologist;Jacquiline Zurcher Flavia Shipper    Supervising physician immediately available to respond to emergencies  See telemetry face sheet for immediately available ER MD    Medication changes reported      No    Fall or balance concerns reported     No    Warm-up and Cool-down  Performed on first and last piece of equipment    Resistance Training Performed  Yes    VAD Patient?  No      Pain Assessment   Currently in Pain?  No/denies    Multiple Pain Sites  No          Social History   Tobacco Use  Smoking Status Never Smoker  Smokeless Tobacco Never Used    Goals Met:  Independence with exercise equipment Exercise tolerated well No report of cardiac concerns or symptoms Strength training completed today  Goals Unmet:  Not Applicable  Comments: Pt able to follow exercise prescription today without complaint.  Will continue to monitor for progression.   Dr. Emily Filbert is Medical Director for [place] and [facility].

## 2017-07-22 ENCOUNTER — Encounter: Payer: Non-veteran care | Admitting: *Deleted

## 2017-07-22 DIAGNOSIS — Z955 Presence of coronary angioplasty implant and graft: Secondary | ICD-10-CM

## 2017-07-22 DIAGNOSIS — R079 Chest pain, unspecified: Secondary | ICD-10-CM | POA: Diagnosis not present

## 2017-07-22 NOTE — Progress Notes (Signed)
Daily Session Note  Patient Details  Name: Jerry [name] MRN: [id number] Date of Birth: 02-18-1951 Referring Provider:     Cardiac Rehab from 07/06/2017 in [facility] Cardiac and Pulmonary Rehab  Referring Provider  [name] MD ([place])      Encounter Date: 07/22/2017  Check In: Session Check In - 07/22/17 1700      Check-In   Location  [facility]    Staff Present  Renita Papa, RN Vickki Hearing, BA, ACSM CEP, Exercise Physiologist;Carroll Enterkin, RN, BSN    Supervising physician immediately available to respond to emergencies  See telemetry face sheet for immediately available ER MD    Medication changes reported      No    Fall or balance concerns reported     No    Warm-up and Cool-down  Performed on first and last piece of equipment    Resistance Training Performed  Yes    VAD Patient?  No      Pain Assessment   Currently in Pain?  No/denies          Social History   Tobacco Use  Smoking Status Never Smoker  Smokeless Tobacco Never Used    Goals Met:  Independence with exercise equipment Exercise tolerated well Personal goals reviewed No report of cardiac concerns or symptoms Strength training completed today  Goals Unmet:  Not Applicable  Comments: Pt able to follow exercise prescription today without complaint.  Will continue to monitor for progression.    Dr. Emily Filbert is Medical Director for [place] and [facility].

## 2017-07-29 DIAGNOSIS — R079 Chest pain, unspecified: Secondary | ICD-10-CM | POA: Diagnosis not present

## 2017-07-29 DIAGNOSIS — Z955 Presence of coronary angioplasty implant and graft: Secondary | ICD-10-CM

## 2017-07-29 NOTE — Progress Notes (Signed)
Daily Session Note  Patient Details  Name: Jerry [name] MRN: [id number] Date of Birth: May 17, 1951 Referring Provider:     Cardiac Rehab from 07/06/2017 in [facility] Cardiac and Pulmonary Rehab  Referring Provider  [name] MD ([place])      Encounter Date: 07/29/2017  Check In: Session Check In - 07/29/17 1636      Check-In   Location  [facility]    Staff Present  Renita Papa, RN BSN;Mandi Ballard, BS, [place];Nada Maclachlan, BA, ACSM CEP, Exercise Physiologist    Supervising physician immediately available to respond to emergencies  See telemetry face sheet for immediately available ER MD    Medication changes reported      No    Fall or balance concerns reported     No    Warm-up and Cool-down  Performed on first and last piece of equipment    Resistance Training Performed  Yes    VAD Patient?  No      Pain Assessment   Currently in Pain?  No/denies    Multiple Pain Sites  No          Social History   Tobacco Use  Smoking Status Never Smoker  Smokeless Tobacco Never Used    Goals Met:  Independence with exercise equipment Exercise tolerated well No report of cardiac concerns or symptoms Strength training completed today  Goals Unmet:  Not Applicable  Comments: Pt able to follow exercise prescription today without complaint.  Will continue to monitor for progression.    Dr. Emily Filbert is Medical Director for [place] and [facility].

## 2017-07-30 DIAGNOSIS — Z955 Presence of coronary angioplasty implant and graft: Secondary | ICD-10-CM

## 2017-07-30 DIAGNOSIS — R079 Chest pain, unspecified: Secondary | ICD-10-CM | POA: Diagnosis not present

## 2017-07-30 NOTE — Progress Notes (Signed)
Daily Session Note  Patient Details  Name: Jerry [name] MRN: [id number] Date of Birth: August 19, 1950 Referring Provider:     Cardiac Rehab from 07/06/2017 in [facility] Cardiac and Pulmonary Rehab  Referring Provider  [name] MD ([place])      Encounter Date: 07/30/2017  Check In: Session Check In - 07/30/17 1634      Check-In   Location  [facility]    Staff Present  Renita Papa, RN BSN;Shyheem Whitham [address] [place], [place], ACSM CEP, Exercise Physiologist    Supervising physician immediately available to respond to emergencies  See telemetry face sheet for immediately available ER MD    Medication changes reported      No    Fall or balance concerns reported     No    Warm-up and Cool-down  Performed on first and last piece of equipment    Resistance Training Performed  Yes    VAD Patient?  No      Pain Assessment   Currently in Pain?  No/denies        Exercise Prescription Changes - 07/30/17 1000      Response to Exercise   Blood Pressure (Admit)  112/60    Blood Pressure (Exercise)  138/62    Blood Pressure (Exit)  122/58    Heart Rate (Admit)  73 bpm    Heart Rate (Exercise)  126 bpm    Heart Rate (Exit)  94 bpm    Rating of Perceived Exertion (Exercise)  13    Symptoms  none    Duration  Progress to 45 minutes of aerobic exercise without signs/symptoms of physical distress    Intensity  THRR unchanged      Progression   Progression  Continue to progress workloads to maintain intensity without signs/symptoms of physical distress.    Average METs  4.2      Resistance Training   Training Prescription  Yes    Weight  10 lb    Reps  10-15      Interval Training   Interval Training  No      Bike   Level  1    Watts  73    Minutes  15    METs  4.45      Biostep-RELP   Level  6    SPM  50    Minutes  15    METs  4       Social History   Tobacco Use  Smoking Status Never Smoker  Smokeless Tobacco Never Used     Goals Met:  Independence with exercise equipment Exercise tolerated well No report of cardiac concerns or symptoms Strength training completed today  Goals Unmet:  Not Applicable  Comments: Pt able to follow exercise prescription today without complaint.  Will continue to monitor for progression.   Dr. Emily Filbert is Medical Director for [place] and [facility].

## 2017-08-03 ENCOUNTER — Encounter: Payer: Non-veteran care | Admitting: *Deleted

## 2017-08-03 DIAGNOSIS — Z955 Presence of coronary angioplasty implant and graft: Secondary | ICD-10-CM

## 2017-08-03 DIAGNOSIS — R079 Chest pain, unspecified: Secondary | ICD-10-CM | POA: Diagnosis not present

## 2017-08-03 NOTE — Progress Notes (Signed)
Daily Session Note  Patient Details  Name: Jerry [name] MRN: [id number] Date of Birth: 1951-04-17 Referring Provider:     Cardiac Rehab from 07/06/2017 in [facility] Cardiac and Pulmonary Rehab  Referring Provider  [name] MD ([place])      Encounter Date: 08/03/2017  Check In: Session Check In - 08/03/17 1634      Check-In   Location  [facility] Re[facility]    Staff Present  Earlean Shawl, BS, ACSM CEP, Exercise Physiologist;Amanda Oletta Darter, BA, ACSM CEP, Exercise Physiologist;Carroll Enterkin, RN, BSN    Supervising physician immediately available to respond to emergencies  See telemetry face sheet for immediately available ER MD    Medication changes reported      No    Fall or balance concerns reported     No    Warm-up and Cool-down  Performed on first and last piece of equipment    Resistance Training Performed  Yes    VAD Patient?  No      Pain Assessment   Currently in Pain?  No/denies    Multiple Pain Sites  No          Social History   Tobacco Use  Smoking Status Never Smoker  Smokeless Tobacco Never Used    Goals Met:  Independence with exercise equipment Exercise tolerated well No report of cardiac concerns or symptoms Strength training completed today  Goals Unmet:  Not Applicable  Comments: Pt able to follow exercise prescription today without complaint.  Will continue to monitor for progression.    Dr. Emily Filbert is Medical Director for [place] and [facility].

## 2017-08-05 ENCOUNTER — Encounter: Payer: Self-pay | Admitting: *Deleted

## 2017-08-05 DIAGNOSIS — Z955 Presence of coronary angioplasty implant and graft: Secondary | ICD-10-CM

## 2017-08-05 NOTE — Progress Notes (Signed)
Cardiac Individual Treatment Plan  Patient Details  Name: Jerry [name] MRN: [id number] Date of Birth: [birth year] Referring Provider:     Cardiac Rehab from 07/06/2017 in [facility] Cardiac and Pulmonary Rehab  Referring Provider  [name] MD ([place])      Initial Encounter Date:    Cardiac Rehab from 07/06/2017 in [facility] Cardiac and Pulmonary Rehab  Date  07/06/17  Referring Provider  [name] MD ([place])      Visit Diagnosis: Status post coronary artery stent placement  Patient's Home Medications on Admission:  Current Outpatient Medications:  .  acetaminophen (TYLENOL) 325 MG tablet, Take 975 mg by mouth 3 (three) times daily as needed for mild pain or fever., Disp: , Rfl:  .  alprostadil (MUSE) 1000 MCG pellet, 1,000 mcg by Transurethral route See admin instructions. Every 96 hours as needed for erection, Disp: , Rfl:  .  aspirin 81 MG chewable tablet, Chew 81 mg by mouth daily., Disp: , Rfl:  .  carvedilol (COREG) 6.25 MG tablet, Take 6.25 mg by mouth 2 (two) times daily with a meal., Disp: , Rfl:  .  clopidogrel (PLAVIX) 75 MG tablet, Take 75 mg by mouth daily., Disp: , Rfl:  .  finasteride (PROSCAR) 5 MG tablet, Take 5 mg by mouth daily., Disp: , Rfl:  .  gabapentin (NEURONTIN) 300 MG capsule, Take 300 mg by mouth 2 (two) times daily., Disp: , Rfl:  .  glipiZIDE (GLUCOTROL) 5 MG tablet, Take 1 tablet (5 mg total) by mouth 2 (two) times daily before a meal., Disp: 60 tablet, Rfl: 1 .  isosorbide dinitrate (ISORDIL) 30 MG tablet, 90 mg 1000 daily, Disp: 90 tablet, Rfl: 0 .  isosorbide dinitrate (ISORDIL) 30 MG tablet, 30 mg at 1800 in the evening, Disp: 30 tablet, Rfl: 0 .  lisinopril (PRINIVIL,ZESTRIL) 5 MG tablet, Take 5 mg by mouth daily. , Disp: , Rfl:  .  magnesium citrate SOLN, Take 296 mLs (1 Bottle total) by mouth once as needed for severe constipation. (Patient not taking: Reported on 07/06/2017), Disp: 195 mL, Rfl: 1 .  magnesium oxide (MAG-OX) 400  MG tablet, Take 400 mg by mouth every evening. , Disp: , Rfl:  .  metFORMIN (GLUCOPHAGE) 1000 MG tablet, Take 500 mg by mouth 2 (two) times daily with a meal. , Disp: , Rfl:  .  nitroGLYCERIN (NITROSTAT) 0.4 MG SL tablet, Place 0.4 mg under the tongue every 5 (five) minutes as needed for chest pain., Disp: , Rfl:  .  omeprazole (PRILOSEC) 20 MG capsule, Take 20 mg by mouth 2 (two) times daily., Disp: , Rfl:  .  pantoprazole (PROTONIX) 40 MG tablet, Take 1 tablet (40 mg total) by mouth daily., Disp: 30 tablet, Rfl: 0 .  rifaximin (XIFAXAN) 550 MG TABS tablet, Take 550 mg by mouth 2 (two) times daily., Disp: , Rfl:  .  rosuvastatin (CRESTOR) 20 MG tablet, Take 1 tablet (20 mg total) by mouth daily. (Patient taking differently: Take 10 mg by mouth daily. ), Disp: 30 tablet, Rfl: 0 .  senna-docusate (SENOKOT-S) 8.6-50 MG tablet, Take 1 tablet by mouth at bedtime as needed for mild constipation. (Patient not taking: Reported on 07/06/2017), Disp: 60 tablet, Rfl: 1 .  simethicone (MYLICON) 80 MG chewable tablet, Chew 160 mg by mouth 2 (two) times daily. , Disp: , Rfl:   Past Medical History: Past Medical History:  Diagnosis Date  . Coronary artery disease   . Diabetes mellitus without complication ([place])   .  Hypercholesteremia   . Hypertension   . Liver lesion   . Primary cancer of kidney or ureter    KIDNEY CANCER    Tobacco Use: Social History   Tobacco Use  Smoking Status Never Smoker  Smokeless Tobacco Never Used    Labs: Recent Review Flowsheet Data    Labs for ITP Cardiac and Pulmonary Rehab Latest Ref Rng & Units 01/21/2016 01/26/2016 01/28/2016 03/20/2017 03/21/2017   Cholestrol 0 - 200 mg/dL - - - 134 129   LDLCALC 0 - 99 mg/dL - - - 71 75   HDL >40 mg/dL - - - 26(L) 27(L)   Trlycerides <150 mg/dL - 94 79 185(H) 134   Hemoglobin A1c 4.8 - 5.6 % 8.9(H) - - - -       Exercise Target Goals:    Exercise Program Goal: Individual exercise prescription set using results from  initial 6 min walk test and THRR while considering  patient's activity barriers and safety.   Exercise Prescription Goal: Initial exercise prescription builds to 30-45 minutes a day of aerobic activity, 2-3 days per week.  Home exercise guidelines will be given to patient during program as part of exercise prescription that the participant will acknowledge.  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 HR  103 bpm     Max Ex. BP  130/56     2 Minute Post BP  124/64        Oxygen Initial Assessment:   Oxygen Re-Evaluation:   Oxygen Discharge (Final Oxygen Re-Evaluation):   Initial Exercise Prescription: Initial Exercise Prescription - 07/06/17 1300      Date of Initial Exercise RX and Referring Provider   Date  07/06/17    Referring Provider  [name] MD ([place])      Treadmill   MPH  2.5    Grade  1    Minutes  15    METs  3.26      Bike   Level  1    Watts  37    Minutes  15    METs  3.2      Biostep-RELP   Level  3    SPM  50    Minutes  15    METs  3      Prescription Details   Frequency (times  per week)  3    Duration  Progress to 45 minutes of aerobic exercise without signs/symptoms of physical  distress      Intensity   THRR 40-80% of Max Heartrate  98-135    Ratings of Perceived Exertion  11-13    Perceived Dyspnea  0-4      Progression   Progression  Continue to progress workloads to maintain intensity without signs/symptoms of physical distress.      Resistance Training   Training Prescription  Yes    Weight  3 lbs    Reps  10-15       Perform Capillary Blood Glucose checks as needed.  Exercise Prescription Changes: Exercise Prescription Changes    Row Name 07/06/17 1300 07/30/17 1000           Response to Exercise   Blood Pressure (Admit)  122/60  112/60      Blood Pressure (Exercise)  130/56  138/62      Blood Pressure (Exit)  124/64  122/58      Heart Rate (Admit)  63 bpm  73 bpm      Heart Rate (Exercise)  103 bpm  126 bpm      Heart Rate (Exit)  62 bpm  94 bpm      Oxygen Saturation (Admit)  98 %  -      Oxygen Saturation (Exit)  98 %  -      Rating of Perceived Exertion (Exercise)  12  13      Symptoms  none  none      Comments  walk test results  -      Duration  -  Progress to 45 minutes of aerobic exercise without signs/symptoms of physical distress      Intensity  -  THRR unchanged        Progression   Progression  -  Continue to progress workloads to maintain intensity without signs/symptoms of physical distress.      Average METs  -  4.2        Resistance Training   Training Prescription  -  Yes      Weight  -  10 lb      Reps  -  10-15        Interval Training   Interval Training  -  No        Bike   Level  -  1      Watts  -  73      Minutes  -  15      METs  -  4.45        Biostep-RELP   Level  -  6      SPM  -  50      Minutes  -  15      METs  -  4         Exercise Comments: Exercise Comments    Row Name 07/08/17 1656           Exercise Comments  First full day of exercise!  Patient was oriented to gym and equipment including functions, settings, policies, and procedures.  Patient's individual exercise  prescription and treatment plan were reviewed.  All starting workloads were established based on the results of the 6 minute walk test done at initial orientation visit.  The plan for exercise progression was also introduced and progression will be customized based on patient's performance and goals  Exercise Goals and Review: Exercise Goals    Row Name 07/06/17 1407             Exercise Goals   Increase Physical Activity  Yes       Intervention  Provide advice, education, support and counseling about physical activity/exercise needs.;Develop an individualized exercise prescription for aerobic and resistive training based on initial evaluation findings, risk stratification, comorbidities and participant's personal goals.       Expected Outcomes  Achievement of increased cardiorespiratory fitness and enhanced flexibility, muscular endurance and strength shown through measurements of functional capacity and personal statement of participant.       Increase Strength and Stamina  Yes       Intervention  Provide advice, education, support and counseling about physical activity/exercise needs.;Develop an individualized exercise prescription for aerobic and resistive training based on initial evaluation findings, risk stratification, comorbidities and participant's personal goals.       Expected Outcomes  Achievement of increased cardiorespiratory fitness and enhanced flexibility, muscular endurance and strength shown through measurements of functional capacity and personal statement of participant.       Able to understand and use rate of perceived exertion (RPE) scale  Yes       Intervention  Provide education and explanation on how to use RPE scale       Expected Outcomes  Short Term: Able to use RPE daily in rehab to express subjective intensity level;Long Term:  Able to use RPE to guide intensity level when exercising independently       Knowledge and understanding of Target Heart Rate  Range (THRR)  Yes       Intervention  Provide education and explanation of THRR including how the numbers were predicted and where they are located for reference       Expected Outcomes  Short Term: Able to state/look up THRR;Long Term: Able to use THRR to govern intensity when exercising independently;Short Term: Able to use daily as guideline for intensity in rehab       Able to check pulse independently  Yes       Intervention  Provide education and demonstration on how to check pulse in carotid and radial arteries.;Review the importance of being able to check your own pulse for safety during independent exercise       Expected Outcomes  Short Term: Able to explain why pulse checking is important during independent exercise;Long Term: Able to check pulse independently and accurately       Understanding of Exercise Prescription  Yes       Intervention  Provide education, explanation, and written materials on patient's individual exercise prescription       Expected Outcomes  Short Term: Able to explain program exercise prescription;Long Term: Able to explain home exercise prescription to exercise independently          Exercise Goals Re-Evaluation : Exercise Goals Re-Evaluation    Row Name 07/08/17 1656 07/30/17 1021           Exercise Goal Re-Evaluation   Exercise Goals Review  Increase Physical Activity;Able to understand and use rate of perceived exertion (RPE) scale;Knowledge and understanding of Target Heart Rate Range (THRR);Increase Strength and Stamina;Understanding of Exercise Prescription  Increase Physical Activity;Increase Strength and Stamina;Able to understand and use rate of perceived exertion (RPE) scale;Knowledge and understanding of Target Heart Rate Range (THRR)      Comments  Reviewed RPE scale, THR and program prescription with pt today.  Pt voiced understanding and  was given a copy of goals to take home.   Rahshawn is progressing well with exercise.  He has increased levels  on all machines and uses 10 lb weights for strength work.        Expected Outcomes  Short: Use RPE daily to regulate intensity.  Long: Follow program prescription in THR.  Short - Vickey will attend HT 3 days per week  Long - Rollen will see continual improvement in MET levels         Discharge Exercise Prescription (Final Exercise Prescription Changes): Exercise Prescription Changes - 07/30/17 1000      Response to Exercise   Blood Pressure (Admit)  112/60    Blood Pressure (Exercise)  138/62    Blood Pressure (Exit)  122/58    Heart Rate (Admit)  73 bpm    Heart Rate (Exercise)  126 bpm    Heart Rate (Exit)  94 bpm    Rating of Perceived Exertion (Exercise)  13    Symptoms  none    Duration  Progress to 45 minutes of aerobic exercise without signs/symptoms of physical distress    Intensity  THRR unchanged      Progression   Progression  Continue to progress workloads to maintain intensity without signs/symptoms of physical distress.    Average METs  4.2      Resistance Training   Training Prescription  Yes    Weight  10 lb    Reps  10-15      Interval Training   Interval Training  No      Bike   Level  1    Watts  73    Minutes  15    METs  4.45      Biostep-RELP   Level  6    SPM  50    Minutes  15    METs  4       Nutrition:  Target Goals: Understanding of nutrition guidelines, daily intake of sodium <158m, cholesterol <2068m calories 30% from fat and 7% or less from saturated fats, daily to have 5 or more servings of fruits and vegetables.  Biometrics: Pre Biometrics - 07/06/17 1408      Pre Biometrics   Height  _0  (1.803 m)    Weight  202 lb (91.6 kg)    Waist Circumference  41 inches    Hip Circumference  38.5 inches    Waist to Hip Ratio  1.06 %    BMI (Calculated)  28.19    Single Leg Stand  19.01 seconds        Nutrition Therapy Plan and Nutrition Goals: Nutrition Therapy & Goals - 07/06/17 1330      Nutrition Therapy   RD  appointment deferred  Yes      Intervention Plan   Intervention  Prescribe, educate and counsel regarding individualized specific dietary modifications aiming towards targeted core components such as weight, hypertension, lipid management, diabetes, heart failure and other comorbidities.;Nutrition handout(s) given to patient.    Expected Outcomes  Short Term Goal: Understand basic principles of dietary content, such as calories, fat, sodium, cholesterol and nutrients.;Short Term Goal: A plan has been developed with personal nutrition goals set during dietitian appointment.;Long Term Goal: Adherence to prescribed nutrition plan.       Nutrition Assessments: Nutrition Assessments - 07/06/17 1331      MEDFICTS Scores   Pre Score  35       Nutrition Goals Re-Evaluation: Nutrition Goals Re-Evaluation  [place] Name 07/22/17 1706             Goals   Current Weight  202 lb (91.6 kg)       Nutrition Goal  Follow nutrition guidelines related to his diagnosis by coming to class and learning how to apply eating habits to his daily lifestyle. He is still deferring the RD appointment.        Comment  Yonis has been changing his eating habits for a while now. He has lost weight independently and is happy how he has changed his diet.        Expected Outcome  Short: come to the Nutrition education offered in [facility]. Long: incorporate the things he learns in class to his already improving eating habits.           Nutrition Goals Discharge (Final Nutrition Goals Re-Evaluation): Nutrition Goals Re-Evaluation - 07/22/17 1706      Goals   Current Weight  202 lb (91.6 kg)    Nutrition Goal  Follow nutrition guidelines related to his diagnosis by coming to class and learning how to apply eating habits to his daily lifestyle. He is still deferring the RD appointment.     Comment  Tyrelle has been changing his eating habits for a while now. He has lost weight independently and is happy how he has  changed his diet.     Expected Outcome  Short: come to the Nutrition education offered in [facility]. Long: incorporate the things he learns in class to his already improving eating habits.        Psychosocial: Target Goals: Acknowledge presence or absence of significant depression and/or stress, maximize coping skills, provide positive support system. Participant is able to verbalize types and ability to use techniques and skills needed for reducing stress and depression.   Initial Review & Psychosocial Screening: Initial Psych Review & Screening - 07/06/17 1331      Initial Review   Current issues with  History of Depression;Current Anxiety/Panic;Current Stress Concerns    Source of Stress Concerns  Poor Coping Skills    Comments  Estes suffers from serious PTSD which causes struggles daily. He does not like big groups, loud noises, small rooms, or feeling crowded in any way.       Family Dynamics   Good Support System?  Yes reports his wife is supportive      Screening Interventions   Interventions  Yes;Encouraged to exercise;Program counselor consult    Expected Outcomes  Short Term goal: Utilizing psychosocial counselor, staff and physician to assist with identification of specific Stressors or current issues interfering with healing process. Setting desired goal for each stressor or current issue identified.;Long Term Goal: Stressors or current issues are controlled or eliminated.;Short Term goal: Identification and review with participant of any Quality of Life or Depression concerns found by scoring the questionnaire.;Long Term goal: The participant improves quality of Life and PHQ9 Scores as seen by post scores and/or verbalization of changes       Quality of Life Scores:  Quality of Life - 07/06/17 1334      Quality of Life Scores   Health/Function Pre  20.87 %    Socioeconomic Pre  21 %    Psych/Spiritual Pre  21 %    Family Pre  21 %    GLOBAL Pre  20.94 %      Scores  of 19 and below usually indicate a poorer quality of life in these areas.  A difference  of  2-3 points is a clinically meaningful difference.  A difference of 2-3 points in the total score of the Quality of Life Index has been associated with significant improvement in overall quality of life, self-image, physical symptoms, and general health in studies assessing change in quality of life.  PHQ-9: Recent Review Flowsheet Data    Depression screen [facility] 2/9 07/06/2017   Decreased Interest 0   Down, Depressed, Hopeless 0   PHQ - 2 Score 0   Altered sleeping 0   Tired, decreased energy 1   Change in appetite 0   Feeling bad or failure about yourself  0   Trouble concentrating 0   Moving slowly or fidgety/restless 0   Suicidal thoughts 0   PHQ-9 Score 1   Difficult doing work/chores Somewhat difficult     Interpretation of Total Score  Total Score Depression Severity:  1-4 = Minimal depression, 5-9 = Mild depression, 10-14 = Moderate depression, 15-19 = Moderately severe depression, 20-27 = Severe depression   Psychosocial Evaluation and Intervention: Psychosocial Evaluation - 07/08/17 1702      Psychosocial Evaluation & Interventions   Interventions  Encouraged to exercise with the program and follow exercise prescription;Relaxation education;Stress management education    Comments  Counselor met with Mr. Showers Schuenemann) today for initial psychosocial evaluation.  He is a 67 year old who had his 2nd stent since 2010 inserted this past July.  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  Hason's support system is very limited with a spouse of approximately 6 weeks (married in November 2018).  He also has other health issues with diabetes and he lost his hearing in the [place] war.  Aveer reports sleeping 4-6 hours typically per night and has a good appetite.  He has struggled with PTSD for quite some time and has typically resorted to isolation to take care of his anxiety  symptoms as a result.  He is not currently on any medications to treat this and states he is not interested in seeing anyone for treatment.  Ibrahem says his mood is generally stable as long as he can stay away from others.  He experienced some stress and anxiety around being here today and was able to express that to counselor, who encouraged him to let staff know if we can help make this less stressful for him in any way.  Marce has goals to increase his stamina and strength to get back to chopping wood again.  Staff will follow and counselor informed when would be available if needed.    Expected Outcomes  Igor will exercise consistently to achieve his stated goals.  He will inform staff of ways to help reduce his anxiety in order to be in an enclosed room with the other cardiac rehab patients and staff.  Kavari will practice his coping strategies of positioning himself in the room in such a way as to reduce his anxiety and stress.  Amon will practice deep breathing as needed.  Counselor and staff will follow up on [place] throughout the course of this program.      Continue Psychosocial Services   Follow up required by staff       Psychosocial Re-Evaluation: Psychosocial Re-Evaluation    [place] Name 07/22/17 1710             Psychosocial Re-Evaluation   Current issues with  Current Stress Concerns       Comments  Adolf's PTSD is still a concern,  but he reports it becoming easier to come to class and is actually enjoying himself. He expresses his main stressor currently is his wife's health. She suffers from Pulmonary Fibrosis and is constantly in and out of doctor visits or procedures. He states he has to drive her everywhere and take care of the house because of her exhaustion.        Expected Outcomes  Short: to learn ways to cope with his wife's diagnosis by coming to education days and to keep taking time for himself to come exercise. Long: Continue to take time to work on his health post  [place].        Interventions  Relaxation education;Encouraged to attend Cardiac Rehabilitation for the exercise;Stress management education          Psychosocial Discharge (Final Psychosocial Re-Evaluation): Psychosocial Re-Evaluation - 07/22/17 1710      Psychosocial Re-Evaluation   Current issues with  Current Stress Concerns    Comments  Shafter's PTSD is still a concern, but he reports it becoming easier to come to class and is actually enjoying himself. He expresses his main stressor currently is his wife's health. She suffers from Pulmonary Fibrosis and is constantly in and out of doctor visits or procedures. He states he has to drive her everywhere and take care of the house because of her exhaustion.     Expected Outcomes  Short: to learn ways to cope with his wife's diagnosis by coming to education days and to keep taking time for himself to come exercise. Long: Continue to take time to work on his health post [place].     Interventions  Relaxation education;Encouraged to attend Cardiac Rehabilitation for the exercise;Stress management education       Vocational Rehabilitation: Provide vocational rehab assistance to qualifying candidates.   Vocational Rehab Evaluation & Intervention: Vocational Rehab - 07/06/17 1341      Initial Vocational Rehab Evaluation & Intervention   Assessment shows need for Vocational Rehabilitation  No       Education: Education Goals: Education classes will be provided on a variety of topics geared toward better understanding of heart health and risk factor modification. Participant will state understanding/return demonstration of topics presented as noted by education test scores.  Learning Barriers/Preferences: Learning Barriers/Preferences - 07/06/17 1339      Learning Barriers/Preferences   Learning Barriers  Hearing    Learning Preferences  Individual Instruction       Education Topics:  AED/CPR: - Group verbal and written  instruction with the use of models to demonstrate the basic use of the AED with the basic ABC's of resuscitation.   General Nutrition Guidelines/Fats and Fiber: -Group instruction provided by verbal, written material, models and posters to present the general guidelines for heart healthy nutrition. Gives an explanation and review of dietary fats and fiber.   Cardiac Rehab from 08/03/2017 in [facility] Cardiac and Pulmonary Rehab  Date  08/03/17  Educator  PI  Instruction Review Code  1- Verbalizes Understanding      Controlling Sodium/Reading Food Labels: -Group verbal and written material supporting the discussion of sodium use in heart healthy nutrition. Review and explanation with models, verbal and written materials for utilization of the food label.   Exercise Physiology & General Exercise Guidelines: - Group verbal and written instruction with models to review the exercise physiology of the cardiovascular system and associated critical values. Provides general exercise guidelines with specific guidelines to those with heart or lung disease.    Aerobic  Exercise & Resistance Training: - Gives group verbal and written instruction on the various components of exercise. Focuses on aerobic and resistive training programs and the benefits of this training and how to safely progress through these programs..   Cardiac Rehab from 08/03/2017 in [facility] Cardiac and Pulmonary Rehab  Date  07/08/17  Educator  AS  Instruction Review Code  1- Verbalizes Understanding      Flexibility, Balance, Mind/Body Relaxation: Provides group verbal/written instruction on the benefits of flexibility and balance training, including mind/body exercise modes such as yoga, pilates and tai chi.  Demonstration and skill practice provided.   Stress and Anxiety: - Provides group verbal and written instruction about the health risks of elevated stress and causes of high stress.  Discuss the correlation between heart/lung  disease and anxiety and treatment options. Review healthy ways to manage with stress and anxiety.   Depression: - Provides group verbal and written instruction on the correlation between heart/lung disease and depressed mood, treatment options, and the stigmas associated with seeking treatment.   Anatomy & Physiology of the Heart: - Group verbal and written instruction and models provide basic cardiac anatomy and physiology, with the coronary electrical and arterial systems. Review of Valvular disease and Heart Failure   Cardiac Procedures: - Group verbal and written instruction to review commonly prescribed medications for heart disease. Reviews the medication, class of the drug, and side effects. Includes the steps to properly store meds and maintain the prescription regimen. (beta blockers and nitrates)   Cardiac Medications I: - Group verbal and written instruction to review commonly prescribed medications for heart disease. Reviews the medication, class of the drug, and side effects. Includes the steps to properly store meds and maintain the prescription regimen.   Cardiac Rehab from 08/03/2017 in [facility] Cardiac and Pulmonary Rehab  Date  07/22/17 Marisue Humble 2]  Educator  CE  Instruction Review Code  1- Verbalizes Understanding      Cardiac Medications II: -Group verbal and written instruction to review commonly prescribed medications for heart disease. Reviews the medication, class of the drug, and side effects. (all other drug classes)   Cardiac Rehab from 08/03/2017 in [facility] Cardiac and Pulmonary Rehab  Date  07/15/17 [facility] Factors]  Educator  [employer]  Instruction Review Code  1- Verbalizes Understanding       Go Sex-Intimacy & Heart Disease, Get SMART - Goal Setting: - Group verbal and written instruction through game format to discuss heart disease and the return to sexual intimacy. Provides group verbal and written material to discuss and apply goal setting through the application of  the S.M.A.R.T. Method.   Other Matters of the Heart: - Provides group verbal, written materials and models to describe Stable Angina and Peripheral Artery. Includes description of the disease process and treatment options available to the cardiac patient.   Exercise & Equipment Safety: - Individual verbal instruction and demonstration of equipment use and safety with use of the equipment.   Cardiac Rehab from 08/03/2017 in [facility] Cardiac and Pulmonary Rehab  Date  07/06/17  Educator  [facility]  Instruction Review Code  1- Verbalizes Understanding      Infection Prevention: - Provides verbal and written material to individual with discussion of infection control including proper hand washing and proper equipment cleaning during exercise session.   Cardiac Rehab from 08/03/2017 in [facility] Cardiac and Pulmonary Rehab  Date  07/06/17  Educator  [facility]  Instruction Review Code  1- [employer]  Prevention: - Provides verbal and written material to individual with discussion of falls prevention and safety.   Cardiac Rehab from 08/03/2017 in [facility] Cardiac and Pulmonary Rehab  Date  07/06/17  Educator  [facility]  Instruction Review Code  1- Verbalizes Understanding      Diabetes: - Individual verbal and written instruction to review signs/symptoms of diabetes, desired ranges of glucose level fasting, after meals and with exercise. Acknowledge that pre and post exercise glucose checks will be done for 3 sessions at entry of program.   Cardiac Rehab from 08/03/2017 in [facility] Cardiac and Pulmonary Rehab  Date  07/06/17  Educator  [facility]  Instruction Review Code  1- Verbalizes Understanding      Know Your Numbers and Risk Factors: -Group verbal and written instruction about important numbers in your health.  Discussion of what are risk factors and how they play a role in the disease process.  Review of Cholesterol, Blood Pressure, Diabetes, and BMI and the role they play in your overall health.    Cardiac Rehab from 08/03/2017 in [facility] Cardiac and Pulmonary Rehab  Date  07/15/17 [facility] Factors]  Educator  [employer]  Instruction Review Code  1- [employer] Understanding      Sleep Hygiene: -Provides group verbal and written instruction about how sleep can affect your health.  Define sleep hygiene, discuss sleep cycles and impact of sleep habits. Review good sleep hygiene tips.    Other: -Provides group and verbal instruction on various topics (see comments)   Knowledge Questionnaire Score: Knowledge Questionnaire Score - 07/06/17 1340      Knowledge Questionnaire Score   Pre Score  26/28 correct answers reviewed with Herbie Baltimore       Core Components/Risk Factors/Patient Goals at Admission: Personal Goals and Risk Factors at Admission - 07/06/17 1329      Core Components/Risk Factors/Patient Goals on Admission   Diabetes  Yes    Intervention  Provide education about signs/symptoms and action to take for hypo/hyperglycemia.;Provide education about proper nutrition, including hydration, and aerobic/resistive exercise prescription along with prescribed medications to achieve blood glucose in normal ranges: Fasting glucose 65-99 mg/dL    Expected Outcomes  Short Term: Participant verbalizes understanding of the signs/symptoms and immediate care of hyper/hypoglycemia, proper foot care and importance of medication, aerobic/resistive exercise and nutrition plan for blood glucose control.;Long Term: Attainment of HbA1C < 7%.    Hypertension  Yes    Intervention  Provide education on lifestyle modifcations including regular physical activity/exercise, weight management, moderate sodium restriction and increased consumption of fresh fruit, vegetables, and low fat dairy, alcohol moderation, and smoking cessation.;Monitor prescription use compliance.    Expected Outcomes  Short Term: Continued assessment and intervention until BP is < 140/70m HG in hypertensive participants. < 130/838mHG in hypertensive  participants with diabetes, heart failure or chronic kidney disease.;Long Term: Maintenance of blood pressure at goal levels.    Lipids  Yes    Intervention  Provide education and support for participant on nutrition & aerobic/resistive exercise along with prescribed medications to achieve LDL <7062mHDL >82m70m  Expected Outcomes  Short Term: Participant states understanding of desired cholesterol values and is compliant with medications prescribed. Participant is following exercise prescription and nutrition guidelines.;Long Term: Cholesterol controlled with medications as prescribed, with individualized exercise RX and with personalized nutrition plan. Value goals: LDL < 70mg85mL > 40 mg.    Stress  Yes RoberBaruchers from severe PTSD.     Intervention  Offer individual and/or  small group education and counseling on adjustment to heart disease, stress management and health-related lifestyle change. Teach and support self-help strategies.;Refer participants experiencing significant psychosocial distress to appropriate mental health specialists for further evaluation and treatment. When possible, include family members and significant others in education/counseling sessions.    Expected Outcomes  Short Term: Participant demonstrates changes in health-related behavior, relaxation and other stress management skills, ability to obtain effective social support, and compliance with psychotropic medications if prescribed.;Long Term: Emotional wellbeing is indicated by absence of clinically significant psychosocial distress or social isolation.       Core Components/Risk Factors/Patient Goals Review:  Goals and Risk Factor Review    Row Name 07/22/17 1701             Core Components/Risk Factors/Patient Goals Review   Personal Goals Review  Diabetes;Stress;Hypertension;Lipids       Review  Kylyn has been able to see a change in his health since starting [facility]. His weight has gone down some,  although that isn't a primary goal for him, his CBG numbers are getting better as well. He has noticed his blood pressure has slowly become lower. His main stress factor right now is his wife who has Pulmonary Fibrosis. He is responsible for caring for her and managing the house. In relation to his PTSD, he is slowly becoming more comfortable with this environment and the program, he reports that he looks forward to coming to class now.        Expected Outcomes  Short: continue to come to [facility] and learn about his risk factors and how to change them. Long: continue with the new lifestyle changes he is making post [facility]          Core Components/Risk Factors/Patient Goals at Discharge (Final Review):  Goals and Risk Factor Review - 07/22/17 1701      Core Components/Risk Factors/Patient Goals Review   Personal Goals Review  Diabetes;Stress;Hypertension;Lipids    Review  Braelynn has been able to see a change in his health since starting [facility]. His weight has gone down some, although that isn't a primary goal for him, his CBG numbers are getting better as well. He has noticed his blood pressure has slowly become lower. His main stress factor right now is his wife who has Pulmonary Fibrosis. He is responsible for caring for her and managing the house. In relation to his PTSD, he is slowly becoming more comfortable with this environment and the program, he reports that he looks forward to coming to class now.     Expected Outcomes  Short: continue to come to [facility] and learn about his risk factors and how to change them. Long: continue with the new lifestyle changes he is making post [facility]       ITP Comments: ITP Comments    Row Name 07/06/17 1215 07/08/17 1042 07/09/17 1740 08/05/17 0637     ITP Comments  Med Review completed. Initial ITP created. Diagnosis can be found in Media tab from [place] 04/17/17  30 day review. Continue with ITP unless directed changes per Medical Director  review.  New to program  Patient states he will not be here January 14th.  30 Day review. Continue with ITP unless directed changes per Medical Director review.        Comments:

## 2017-08-06 DIAGNOSIS — R079 Chest pain, unspecified: Secondary | ICD-10-CM | POA: Diagnosis not present

## 2017-08-06 DIAGNOSIS — Z955 Presence of coronary angioplasty implant and graft: Secondary | ICD-10-CM

## 2017-08-06 NOTE — Progress Notes (Signed)
Daily Session Note  Patient Details  Name: Jerry [name] MRN: [id number] Date of Birth: 1951-03-12 Referring Provider:     Cardiac Rehab from 07/06/2017 in [facility] Cardiac and Pulmonary Rehab  Referring Provider  [name] MD ([place])      Encounter Date: 08/06/2017  Check In: Session Check In - 08/06/17 1712      Check-In   Location  [facility]-Cardiac & Pulmonary Rehab    Staff Present  Renita Papa, RN BSN;Harlyn Italiano [address] [place], [place], ACSM CEP, Exercise Physiologist    Supervising physician immediately available to respond to emergencies  See telemetry face sheet for immediately available ER MD    Medication changes reported      No    Fall or balance concerns reported     No    Warm-up and Cool-down  Performed on first and last piece of equipment    Resistance Training Performed  Yes    VAD Patient?  No      Pain Assessment   Currently in Pain?  No/denies          Social History   Tobacco Use  Smoking Status Never Smoker  Smokeless Tobacco Never Used    Goals Met:  Independence with exercise equipment Exercise tolerated well No report of cardiac concerns or symptoms Strength training completed today  Goals Unmet:  Not Applicable  Comments: Pt able to follow exercise prescription today without complaint.  Will continue to monitor for progression.   Dr. Emily Filbert is Medical Director for [place] and [facility].

## 2017-08-10 ENCOUNTER — Encounter: Payer: Non-veteran care | Attending: Adult Health | Admitting: *Deleted

## 2017-08-10 DIAGNOSIS — R079 Chest pain, unspecified: Secondary | ICD-10-CM | POA: Diagnosis present

## 2017-08-10 DIAGNOSIS — Z955 Presence of coronary angioplasty implant and graft: Secondary | ICD-10-CM | POA: Insufficient documentation

## 2017-08-10 NOTE — Progress Notes (Signed)
Daily Session Note  Patient Details  Name: Jerry [name] MRN: [id number] Date of Birth: 1950-08-27 Referring Provider:     Cardiac Rehab from 07/06/2017 in [facility] Cardiac and Pulmonary Rehab  Referring Provider  [name] MD ([place])      Encounter Date: 08/10/2017  Check In: Session Check In - 08/10/17 0803      Check-In   Location  [facility]-Cardiac & Pulmonary Rehab    Staff Present  Alberteen Sam, MA, RCEP, CCRP, Exercise Physiologist;Kelly Amedeo Plenty, BS, ACSM CEP, Exercise Physiologist;Susanne Bice, RN, BSN, CCRP    Supervising physician immediately available to respond to emergencies  See telemetry face sheet for immediately available ER MD    Medication changes reported      No    Fall or balance concerns reported     No    Warm-up and Cool-down  Performed on first and last piece of equipment    Resistance Training Performed  Yes    VAD Patient?  No      Pain Assessment   Currently in Pain?  No/denies    Multiple Pain Sites  No          Social History   Tobacco Use  Smoking Status Never Smoker  Smokeless Tobacco Never Used    Goals Met:  Independence with exercise equipment Exercise tolerated well No report of cardiac concerns or symptoms Strength training completed today  Goals Unmet:  Not Applicable  Comments: Pt able to follow exercise prescription today without complaint.  Will continue to monitor for progression.    Dr. Emily Filbert is Medical Director for [place] and [facility].

## 2017-08-12 DIAGNOSIS — R079 Chest pain, unspecified: Secondary | ICD-10-CM | POA: Diagnosis not present

## 2017-08-12 DIAGNOSIS — Z955 Presence of coronary angioplasty implant and graft: Secondary | ICD-10-CM

## 2017-08-12 NOTE — Progress Notes (Signed)
Daily Session Note  Patient Details  Name: Jerry [name] MRN: [id number] Date of Birth: Jun 15, 1951 Referring Provider:     Cardiac Rehab from 07/06/2017 in [facility] Cardiac and Pulmonary Rehab  Referring Provider  [name] MD ([place])      Encounter Date: 08/12/2017  Check In: Session Check In - 08/12/17 0738      Check-In   Location  [facility]-Cardiac & Pulmonary Rehab    Staff Present  Justin Mend RCP,RRT,BSRT;Heath Lark, RN, BSN, CCRP;Jessica Luan Pulling, MA, RCEP, CCRP, Exercise Physiologist    Supervising physician immediately available to respond to emergencies  See telemetry face sheet for immediately available ER MD    Medication changes reported      No    Fall or balance concerns reported     No    Warm-up and Cool-down  Performed on first and last piece of equipment    Resistance Training Performed  Yes    VAD Patient?  No      Pain Assessment   Currently in Pain?  No/denies          Social History   Tobacco Use  Smoking Status Never Smoker  Smokeless Tobacco Never Used    Goals Met:  Independence with exercise equipment Exercise tolerated well No report of cardiac concerns or symptoms Strength training completed today  Goals Unmet:  Not Applicable  Comments: Pt able to follow exercise prescription today without complaint.  Will continue to monitor for progression.   Dr. Emily Filbert is Medical Director for [place] and [facility].

## 2017-08-17 ENCOUNTER — Encounter: Payer: Non-veteran care | Admitting: *Deleted

## 2017-08-17 DIAGNOSIS — R079 Chest pain, unspecified: Secondary | ICD-10-CM | POA: Diagnosis not present

## 2017-08-17 DIAGNOSIS — Z955 Presence of coronary angioplasty implant and graft: Secondary | ICD-10-CM

## 2017-08-17 NOTE — Progress Notes (Signed)
Daily Session Note  Patient Details  Name: Jerry [name] MRN: [id number] Date of Birth: May 30, 1951 Referring Provider:     Cardiac Rehab from 07/06/2017 in [facility] Cardiac and Pulmonary Rehab  Referring Provider  [name] MD ([place])      Encounter Date: 08/17/2017  Check In: Session Check In - 08/17/17 0757      Check-In   Location  [facility]-Cardiac & Pulmonary Rehab    Staff Present  Alberteen Sam, MA, RCEP, CCRP, Exercise Physiologist;Kelly Amedeo Plenty, BS, ACSM CEP, Exercise Physiologist;Susanne Bice, RN, BSN, CCRP    Supervising physician immediately available to respond to emergencies  See telemetry face sheet for immediately available ER MD    Medication changes reported      No    Fall or balance concerns reported     No    Warm-up and Cool-down  Performed on first and last piece of equipment    Resistance Training Performed  Yes    VAD Patient?  No      Pain Assessment   Currently in Pain?  No/denies          Social History   Tobacco Use  Smoking Status Never Smoker  Smokeless Tobacco Never Used    Goals Met:  Independence with exercise equipment Exercise tolerated well No report of cardiac concerns or symptoms Strength training completed today  Goals Unmet:  Not Applicable  Comments: Pt able to follow exercise prescription today without complaint.  Will continue to monitor for progression.    Dr. Emily Filbert is Medical Director for [place] and [facility].

## 2017-08-19 VITALS — Ht 71.0 in | Wt 205.0 lb

## 2017-08-19 DIAGNOSIS — R079 Chest pain, unspecified: Secondary | ICD-10-CM | POA: Diagnosis not present

## 2017-08-19 DIAGNOSIS — Z955 Presence of coronary angioplasty implant and graft: Secondary | ICD-10-CM

## 2017-08-19 NOTE — Progress Notes (Signed)
Daily Session Note  Patient Details  Name: Jerry [name] MRN: [id number] Date of Birth: 01-15-1951 Referring Provider:     Cardiac Rehab from 07/06/2017 in [facility] Cardiac and Pulmonary Rehab  Referring Provider  [name] MD ([place])      Encounter Date: 08/19/2017  Check In: Session Check In - 08/19/17 0723      Check-In   Location  [facility]-Cardiac & Pulmonary Rehab    Staff Present  Justin Mend Lorre Nick, MA, RCEP, CCRP, Exercise Physiologist;Susanne Bice, RN, BSN, CCRP    Supervising physician immediately available to respond to emergencies  See telemetry face sheet for immediately available ER MD    Medication changes reported      No    Fall or balance concerns reported     No    Warm-up and Cool-down  Performed on first and last piece of equipment    Resistance Training Performed  Yes    VAD Patient?  No      Pain Assessment   Currently in Pain?  No/denies          Social History   Tobacco Use  Smoking Status Never Smoker  Smokeless Tobacco Never Used    Goals Met:  Independence with exercise equipment Exercise tolerated well No report of cardiac concerns or symptoms Strength training completed today  Goals Unmet:  Not Applicable  Comments: Pt able to follow exercise prescription today without complaint.  Will continue to monitor for progression. [place] Name 07/06/17 1347 08/19/17 0946       6 Minute Walk   Phase  Initial  Discharge    Distance  1400 feet  1800 feet    Distance % Change  -  28.6 %    Distance Feet Change  -  400 ft    Walk Time  6 minutes  6 minutes    # of Rest Breaks  0  0    MPH  2.65  3.41    METS  3.28  4.66    RPE  12  15    VO2 Peak  11.47  16.3    Symptoms  No  No    Resting HR  62 bpm  65 bpm    Resting BP  122/60  134/64    Resting Oxygen Saturation   98 %  -    Exercise Oxygen Saturation  during 6 min walk  98 %  -    Max Ex. HR  103 bpm  132 bpm    Max Ex. BP  130/56   174/52    2 Minute Post BP  124/64  -        Dr. Emily Filbert is Medical Director for [place] and [facility].

## 2017-08-19 NOTE — Patient Instructions (Signed)
Discharge Patient Instructions  Patient Details  Name: Jerry [name] MRN: [id number] Date of Birth: [birth year] Referring Provider:  [place]   Number of Visits: 36/36  Reason for Discharge:  Patient reached a stable level of exercise. Patient independent in their exercise. Patient has met program and personal goals.  Smoking History:  Social History   Tobacco Use  Smoking Status Never Smoker  Smokeless Tobacco Never Used    Diagnosis:  Status post coronary artery stent placement  Initial Exercise Prescription: Initial Exercise Prescription - 07/06/17 1300      Date of Initial Exercise RX and Referring Provider   Date  07/06/17    Referring Provider  Jerry Quint [place] ([facility])      Treadmill   MPH  2.5    Grade  1    Minutes  15    METs  3.26      Bike   Level  1    Watts  37    Minutes  15    METs  3.2      Biostep-RELP   Level  3    SPM  50    Minutes  15    METs  3      Prescription Details   Frequency (times per week)  3    Duration  Progress to 45 minutes of aerobic exercise without signs/symptoms of physical distress      Intensity   THRR 40-80% of Max Heartrate  98-135    Ratings of Perceived Exertion  11-13    Perceived Dyspnea  0-4      Progression   Progression  Continue to progress workloads to maintain intensity without signs/symptoms of physical distress.      Resistance Training   Training Prescription  Yes    Weight  3 lbs    Reps  10-15       Discharge Exercise Prescription (Final Exercise Prescription Changes): Exercise Prescription Changes - 08/12/17 1500      Response to Exercise   Blood Pressure (Admit)  136/54    Blood Pressure (Exercise)  140/54    Blood Pressure (Exit)  116/54    Heart Rate (Admit)  76 bpm    Heart Rate (Exercise)  103 bpm    Heart Rate (Exit)  70 bpm    Rating of Perceived Exertion (Exercise)  12    Symptoms  none    Duration  Continue with 45 min of aerobic exercise without  signs/symptoms of physical distress.    Intensity  THRR unchanged      Progression   Progression  Continue to progress workloads to maintain intensity without signs/symptoms of physical distress.    Average METs  4.29      Resistance Training   Training Prescription  Yes    Weight  10 lb    Reps  10-15      Interval Training   Interval Training  No      Treadmill   MPH  3    Grade  4    Minutes  15    METs  4.95      Bike   Level  9    Watts  56    Minutes  15    METs  4.1      Biostep-RELP   Level  6    Minutes  15    METs  4       Functional Capacity: 6 Minute Walk  [name] 07/06/17 1347 08/19/17 0946       6 Minute Walk   Phase  Initial  Discharge    Distance  1400 feet  1800 feet    Distance % Change  -  28.6 %    Distance Feet Change  -  400 ft    Walk Time  6 minutes  6 minutes    # of Rest Breaks  0  0    MPH  2.65  3.41    METS  3.28  4.66    RPE  12  15    VO2 Peak  11.47  16.3    Symptoms  No  No    Resting HR  62 bpm  65 bpm    Resting BP  122/60  134/64    Resting Oxygen Saturation   98 %  -    Exercise Oxygen Saturation  during 6 min walk  98 %  -    Max Ex. HR  103 bpm  132 bpm    Max Ex. BP  130/56  174/52    2 Minute Post BP  124/64  -       Quality of Life: Quality of Life - 08/19/17 0728      Quality of Life Scores   Health/Function Pre  20.87 %    Health/Function Post  26.47 %    Health/Function % Change  26.83 %    Socioeconomic Pre  21 %    Socioeconomic Post  28.93 %    Socioeconomic % Change   37.76 %    Psych/Spiritual Pre  21 %    Psych/Spiritual Post  30 %    Psych/Spiritual % Change  42.86 %    Family Pre  21 %    Family Post  27 %    Family % Change  28.57 %    GLOBAL Pre  20.94 %    GLOBAL Post  27.8 %    GLOBAL % Change  32.76 %       Personal Goals: Goals established at orientation with interventions provided to work toward goal. Personal Goals and Risk Factors at Admission - 07/06/17 1329       Core Components/Risk Factors/Patient Goals on Admission   Diabetes  Yes    Intervention  Provide education about signs/symptoms and action to take for hypo/hyperglycemia.;Provide education about proper nutrition, including hydration, and aerobic/resistive exercise prescription along with prescribed medications to achieve blood glucose in normal ranges: Fasting glucose 65-99 mg/dL    Expected Outcomes  Short Term: Participant verbalizes understanding of the signs/symptoms and immediate care of hyper/hypoglycemia, proper foot care and importance of medication, aerobic/resistive exercise and nutrition plan for blood glucose control.;Long Term: Attainment of HbA1C < 7%.    Hypertension  Yes    Intervention  Provide education on lifestyle modifcations including regular physical activity/exercise, weight management, moderate sodium restriction and increased consumption of fresh fruit, vegetables, and low fat dairy, alcohol moderation, and smoking cessation.;Monitor prescription use compliance.    Expected Outcomes  Short Term: Continued assessment and intervention until BP is < 140/81m HG in hypertensive participants. < 130/829mHG in hypertensive participants with diabetes, heart failure or chronic kidney disease.;Long Term: Maintenance of blood pressure at goal levels.    Lipids  Yes    Intervention  Provide education and support for participant on nutrition & aerobic/resistive exercise along with prescribed medications to achieve LDL <7055mHDL >32m61m  Expected Outcomes  Short  Term: Participant states understanding of desired cholesterol values and is compliant with medications prescribed. Participant is following exercise prescription and nutrition guidelines.;Long Term: Cholesterol controlled with medications as prescribed, with individualized exercise RX and with personalized nutrition plan. Value goals: LDL < 39m, HDL > 40 mg.    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     Intervention  Offer  individual and/or small group education and counseling on adjustment to heart disease, stress management and health-related lifestyle change. Teach and support self-help strategies.;Refer participants experiencing significant psychosocial distress to appropriate mental health specialists for further evaluation and treatment. When possible, include family members and significant others in education/counseling sessions.    Expected Outcomes  Short Term: Participant demonstrates changes in health-related behavior, relaxation and other stress management skills, ability to obtain effective social support, and compliance with psychotropic medications if prescribed.;Long Term: Emotional wellbeing is indicated by absence of clinically significant psychosocial distress or social isolation.        Personal Goals Discharge: Goals and Risk Factor Review - 08/19/17 0804      Core Components/Risk Factors/Patient Goals Review   Personal Goals Review  Diabetes;Stress;Hypertension;Lipids;Weight Management/Obesity    Review  RDillinhas domne well in rehab.  His weight has stayed between 202-205 lbs.  He continues to keep a close eye on his blood pressure and blood sugars at home.  He would llike to check his sugars more, but [place] not [place].  He does check his blood pressure daily.  He continues to care for his wife as well.     Expected Outcomes  Short: Graduate!  Long: Continue to work on risk factor modificiations.        Exercise Goals and Review: Exercise Goals    Row Name 07/06/17 1407             Exercise Goals   Increase Physical Activity  Yes       Intervention  Provide advice, education, support and counseling about physical activity/exercise needs.;Develop an individualized exercise prescription for aerobic and resistive training based on initial evaluation findings, risk stratification, comorbidities and participant's personal goals.       Expected Outcomes   Achievement of increased cardiorespiratory fitness and enhanced flexibility, muscular endurance and strength shown through measurements of functional capacity and personal statement of participant.       Increase Strength and Stamina  Yes       Intervention  Provide advice, education, support and counseling about physical activity/exercise needs.;Develop an individualized exercise prescription for aerobic and resistive training based on initial evaluation findings, risk stratification, comorbidities and participant's personal goals.       Expected Outcomes  Achievement of increased cardiorespiratory fitness and enhanced flexibility, muscular endurance and strength shown through measurements of functional capacity and personal statement of participant.       Able to understand and use rate of perceived exertion (RPE) scale  Yes       Intervention  Provide education and explanation on how to use RPE scale       Expected Outcomes  Short Term: Able to use RPE daily in rehab to express subjective intensity level;Long Term:  Able to use RPE to guide intensity level when exercising independently       Knowledge and understanding of Target Heart Rate Range (THRR)  Yes       Intervention  Provide education and explanation of THRR including how the numbers were predicted and where  they are located for reference       Expected Outcomes  Short Term: Able to state/look up THRR;Long Term: Able to use THRR to govern intensity when exercising independently;Short Term: Able to use daily as guideline for intensity in rehab       Able to check pulse independently  Yes       Intervention  Provide education and demonstration on how to check pulse in carotid and radial arteries.;Review the importance of being able to check your own pulse for safety during independent exercise       Expected Outcomes  Short Term: Able to explain why pulse checking is important during independent exercise;Long Term: Able to check pulse  independently and accurately       Understanding of Exercise Prescription  Yes       Intervention  Provide education, explanation, and written materials on patient's individual exercise prescription       Expected Outcomes  Short Term: Able to explain program exercise prescription;Long Term: Able to explain home exercise prescription to exercise independently          Nutrition & Weight - Outcomes: Pre Biometrics - 07/06/17 1408      Pre Biometrics   Height  5' 11"  (1.803 m)    Weight  202 lb (91.6 kg)    Waist Circumference  41 inches    Hip Circumference  38.5 inches    Waist to Hip Ratio  1.06 %    BMI (Calculated)  28.19    Single Leg Stand  19.01 seconds      Post Biometrics - 08/19/17 0947       Post  Biometrics   Height  5' 11"  (1.803 m)    Weight  205 lb (93 kg)    Hip Circumference  41 inches    BMI (Calculated)  28.6    Single Leg Stand  30 seconds       Nutrition: Nutrition Therapy & Goals - 07/06/17 1330      Nutrition Therapy   RD appointment deferred  Yes      Intervention Plan   Intervention  Prescribe, educate and counsel regarding individualized specific dietary modifications aiming towards targeted core components such as weight, hypertension, lipid management, diabetes, heart failure and other comorbidities.;Nutrition handout(s) given to patient.    Expected Outcomes  Short Term Goal: Understand basic principles of dietary content, such as calories, fat, sodium, cholesterol and nutrients.;Short Term Goal: A plan has been developed with personal nutrition goals set during dietitian appointment.;Long Term Goal: Adherence to prescribed nutrition plan.       Nutrition Discharge: Nutrition Assessments - 08/19/17 0729      MEDFICTS Scores   Pre Score  35    Post Score  79    Score Difference  44       Education Questionnaire Score: Knowledge Questionnaire Score - 08/19/17 0728      Knowledge Questionnaire Score   Pre Score  26/28    Post Score   27/28       Goals reviewed with patient; copy given to patient.

## 2017-08-21 ENCOUNTER — Encounter: Payer: Non-veteran care | Admitting: *Deleted

## 2017-08-21 DIAGNOSIS — Z955 Presence of coronary angioplasty implant and graft: Secondary | ICD-10-CM

## 2017-08-21 DIAGNOSIS — R079 Chest pain, unspecified: Secondary | ICD-10-CM | POA: Diagnosis not present

## 2017-08-21 NOTE — Progress Notes (Signed)
Cardiac Individual Treatment Plan  Patient Details  Name: Jerry [name] MRN: [id number] Date of Birth: [birth year] Referring Provider:     Cardiac Rehab from 07/06/2017 in [facility] Cardiac and Pulmonary Rehab  Referring Provider  [name] MD ([place])      Initial Encounter Date:    Cardiac Rehab from 07/06/2017 in [facility] Cardiac and Pulmonary Rehab  Date  07/06/17  Referring Provider  [name] MD ([place])      Visit Diagnosis: Status post coronary artery stent placement  Patient's Home Medications on Admission:  Current Outpatient Medications:  .  acetaminophen (TYLENOL) 325 MG tablet, Take 975 mg by mouth 3 (three) times daily as needed for mild pain or fever., Disp: , Rfl:  .  alprostadil (MUSE) 1000 MCG pellet, 1,000 mcg by Transurethral route See admin instructions. Every 96 hours as needed for erection, Disp: , Rfl:  .  aspirin 81 MG chewable tablet, Chew 81 mg by mouth daily., Disp: , Rfl:  .  carvedilol (COREG) 6.25 MG tablet, Take 6.25 mg by mouth 2 (two) times daily with a meal., Disp: , Rfl:  .  clopidogrel (PLAVIX) 75 MG tablet, Take 75 mg by mouth daily., Disp: , Rfl:  .  finasteride (PROSCAR) 5 MG tablet, Take 5 mg by mouth daily., Disp: , Rfl:  .  gabapentin (NEURONTIN) 300 MG capsule, Take 300 mg by mouth 2 (two) times daily., Disp: , Rfl:  .  glipiZIDE (GLUCOTROL) 5 MG tablet, Take 1 tablet (5 mg total) by mouth 2 (two) times daily before a meal., Disp: 60 tablet, Rfl: 1 .  isosorbide dinitrate (ISORDIL) 30 MG tablet, 90 mg 1000 daily, Disp: 90 tablet, Rfl: 0 .  isosorbide dinitrate (ISORDIL) 30 MG tablet, 30 mg at 1800 in the evening, Disp: 30 tablet, Rfl: 0 .  lisinopril (PRINIVIL,ZESTRIL) 5 MG tablet, Take 5 mg by mouth daily. , Disp: , Rfl:  .  magnesium citrate SOLN, Take 296 mLs (1 Bottle total) by mouth once as needed for severe constipation. (Patient not taking: Reported on 07/06/2017), Disp: 195 mL, Rfl: 1 .  magnesium oxide (MAG-OX) 400  MG tablet, Take 400 mg by mouth every evening. , Disp: , Rfl:  .  metFORMIN (GLUCOPHAGE) 1000 MG tablet, Take 500 mg by mouth 2 (two) times daily with a meal. , Disp: , Rfl:  .  nitroGLYCERIN (NITROSTAT) 0.4 MG SL tablet, Place 0.4 mg under the tongue every 5 (five) minutes as needed for chest pain., Disp: , Rfl:  .  omeprazole (PRILOSEC) 20 MG capsule, Take 20 mg by mouth 2 (two) times daily., Disp: , Rfl:  .  pantoprazole (PROTONIX) 40 MG tablet, Take 1 tablet (40 mg total) by mouth daily., Disp: 30 tablet, Rfl: 0 .  rifaximin (XIFAXAN) 550 MG TABS tablet, Take 550 mg by mouth 2 (two) times daily., Disp: , Rfl:  .  rosuvastatin (CRESTOR) 20 MG tablet, Take 1 tablet (20 mg total) by mouth daily. (Patient taking differently: Take 10 mg by mouth daily. ), Disp: 30 tablet, Rfl: 0 .  senna-docusate (SENOKOT-S) 8.6-50 MG tablet, Take 1 tablet by mouth at bedtime as needed for mild constipation. (Patient not taking: Reported on 07/06/2017), Disp: 60 tablet, Rfl: 1 .  simethicone (MYLICON) 80 MG chewable tablet, Chew 160 mg by mouth 2 (two) times daily. , Disp: , Rfl:   Past Medical History: Past Medical History:  Diagnosis Date  . Coronary artery disease   . Diabetes mellitus without complication ([place])   .  Hypercholesteremia   . Hypertension   . Liver lesion   . Primary cancer of kidney or ureter    KIDNEY CANCER    Tobacco Use: Social History   Tobacco Use  Smoking Status Never Smoker  Smokeless Tobacco Never Used    Labs: Recent Review Flowsheet Data    Labs for ITP Cardiac and Pulmonary Rehab Latest Ref Rng & Units 01/21/2016 01/26/2016 01/28/2016 03/20/2017 03/21/2017   Cholestrol 0 - 200 mg/dL - - - 134 129   LDLCALC 0 - 99 mg/dL - - - 71 75   HDL >40 mg/dL - - - 26(L) 27(L)   Trlycerides <150 mg/dL - 94 79 185(H) 134   Hemoglobin A1c 4.8 - 5.6 % 8.9(H) - - - -       Exercise Target Goals:    Exercise Program Goal: Individual exercise prescription set using results from  initial 6 min walk test and THRR while considering  patient's activity barriers and safety.   Exercise Prescription Goal: Initial exercise prescription builds to 30-45 minutes a day of aerobic activity, 2-3 days per week.  Home exercise guidelines will be given to patient during program as part of exercise prescription that the participant will acknowledge.  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, has had 4 surgeries since then but still has limited mobility and pain    Cardiac Risk Stratification  Moderate       6 Minute Walk: 6 Minute Walk    Row Name 07/06/17 1347 08/19/17 0946       6 Minute Walk   Phase  Initial  Discharge    Distance  1400 feet  1800 feet    Distance % Change  -  28.6 %    Distance Feet Change  -  400 ft    Walk Time  6 minutes  6 minutes    # of Rest Breaks  0  0    MPH  2.65  3.41    METS  3.28  4.66    RPE  12  15    VO2 Peak  11.47  16.3    Symptoms  No  No    Resting HR  62 bpm  65 bpm    Resting BP  122/60  134/64    Resting Oxygen Saturation   98 %  -    Exercise Oxygen Saturation  during 6 min walk  98 %  -    Max Ex. HR  103 bpm  132 bpm    Max Ex. BP  130/56  174/52    2 Minute Post BP  124/64  -       Oxygen Initial Assessment:   Oxygen Re-Evaluation:   Oxygen Discharge (Final Oxygen Re-Evaluation):   Initial Exercise Prescription: Initial Exercise Prescription - 07/06/17 1300      Date of Initial Exercise RX and Referring Provider   Date  07/06/17    Referring Provider  [name] MD ([place])      Treadmill   MPH  2.5    Grade  1    Minutes  15    METs  3.26      Bike   Level  1    Watts  37    Minutes  15    METs  3.2      Biostep-RELP   Level  3    SPM  50    Minutes   15    METs  3      Prescription Details   Frequency (times per week)  3    Duration  Progress to 45 minutes of aerobic exercise without signs/symptoms of physical distress      Intensity   THRR 40-80% of Max Heartrate  98-135    Ratings of Perceived Exertion  11-13    Perceived Dyspnea  0-4      Progression   Progression  Continue to progress workloads to maintain intensity without signs/symptoms of physical distress.      Resistance Training   Training Prescription  Yes    Weight  3 lbs    Reps  10-15       Perform Capillary Blood Glucose checks as needed.  Exercise Prescription Changes: Exercise Prescription Changes    Row Name 07/06/17 1300 07/30/17 1000 08/12/17 1500         Response to Exercise   Blood Pressure (Admit)  122/60  112/60  136/54     Blood Pressure (Exercise)  130/56  138/62  140/54     Blood Pressure (Exit)  124/64  122/58  116/54     Heart Rate (Admit)  63 bpm  73 bpm  76 bpm     Heart Rate (Exercise)  103 bpm  126 bpm  103 bpm     Heart Rate (Exit)  62 bpm  94 bpm  70 bpm     Oxygen Saturation (Admit)  98 %  -  -     Oxygen Saturation (Exit)  98 %  -  -     Rating of Perceived Exertion (Exercise)  12  13  12      Symptoms  none  none  none     Comments  walk test results  -  -     Duration  -  Progress to 45 minutes of aerobic exercise without signs/symptoms of physical distress  Continue with 45 min of aerobic exercise without signs/symptoms of physical distress.     Intensity  -  THRR unchanged  THRR unchanged       Progression   Progression  -  Continue to progress workloads to maintain intensity without signs/symptoms of physical distress.  Continue to progress workloads to maintain intensity without signs/symptoms of physical distress.     Average METs  -  4.2  4.29       Resistance Training   Training Prescription  -  Yes  Yes     Weight  -  10 lb  10 lb     Reps  -  10-15  10-15       Interval Training   Interval Training  -  No  No        Treadmill   MPH  -  -  3     Grade  -  -  4     Minutes  -  -  15     METs  -  -  4.95       Bike   Level  -  1  9     Watts  -  73  56     Minutes  -  15  15     METs  -  4.45  4.1       Biostep-RELP  Level  -  6  6     SPM  -  50  -     Minutes  -  15  15     METs  -  4  4        Exercise Comments: Exercise Comments    Row Name 07/08/17 1656 08/21/17 0933         Exercise Comments  First full day of exercise!  Patient was oriented to gym and equipment including functions, settings, policies, and procedures.  Patient's individual exercise prescription and treatment plan were reviewed.  All starting workloads were established based on the results of the 6 minute walk test done at initial orientation visit.  The plan for exercise progression was also introduced and progression will be customized based on patient's performance and goals   Jerry [name] graduated today from  rehab with 26 sessions completed.  Details of the patient's exercise prescription and what He needs to do in order to continue the prescription and progress were discussed with patient.  Patient was given a copy of prescription and goals.  Patient verbalized understanding.  Jerry [name] plans to continue to exercise by walking his trail at home.         Exercise Goals and Review: Exercise Goals    Row Name 07/06/17 1407             Exercise Goals   Increase Physical Activity  Yes       Intervention  Provide advice, education, support and counseling about physical activity/exercise needs.;Develop an individualized exercise prescription for aerobic and resistive training based on initial evaluation findings, risk stratification, comorbidities and participant's personal goals.       Expected Outcomes  Achievement of increased cardiorespiratory fitness and enhanced flexibility, muscular endurance and strength shown through measurements of functional capacity and personal statement of participant.       Increase  Strength and Stamina  Yes       Intervention  Provide advice, education, support and counseling about physical activity/exercise needs.;Develop an individualized exercise prescription for aerobic and resistive training based on initial evaluation findings, risk stratification, comorbidities and participant's personal goals.       Expected Outcomes  Achievement of increased cardiorespiratory fitness and enhanced flexibility, muscular endurance and strength shown through measurements of functional capacity and personal statement of participant.       Able to understand and use rate of perceived exertion (RPE) scale  Yes       Intervention  Provide education and explanation on how to use RPE scale       Expected Outcomes  Short Term: Able to use RPE daily in rehab to express subjective intensity level;Long Term:  Able to use RPE to guide intensity level when exercising independently       Knowledge and understanding of Target Heart Rate Range (THRR)  Yes       Intervention  Provide education and explanation of THRR including how the numbers were predicted and where they are located for reference       Expected Outcomes  Short Term: Able to state/look up THRR;Long Term: Able to use THRR to govern intensity when exercising independently;Short Term: Able to use daily as guideline for intensity in rehab       Able to check pulse independently  Yes       Intervention  Provide education and demonstration on how to check pulse in carotid and radial arteries.;Review the importance of being able to check your  own pulse for safety during independent exercise       Expected Outcomes  Short Term: Able to explain why pulse checking is important during independent exercise;Long Term: Able to check pulse independently and accurately       Understanding of Exercise Prescription  Yes       Intervention  Provide education, explanation, and written materials on patient's individual exercise prescription       Expected  Outcomes  Short Term: Able to explain program exercise prescription;Long Term: Able to explain home exercise prescription to exercise independently          Exercise Goals Re-Evaluation : Exercise Goals Re-Evaluation    Row Name 07/08/17 1656 07/30/17 1021 08/12/17 1500 08/19/17 0802       Exercise Goal Re-Evaluation   Exercise Goals Review  Increase Physical Activity;Able to understand and use rate of perceived exertion (RPE) scale;Knowledge and understanding of Target Heart Rate Range (THRR);Increase Strength and Stamina;Understanding of Exercise Prescription  Increase Physical Activity;Increase Strength and Stamina;Able to understand and use rate of perceived exertion (RPE) scale;Knowledge and understanding of Target Heart Rate Range (THRR)  Increase Physical Activity;Increase Strength and Stamina;Understanding of Exercise Prescription  Increase Physical Activity;Increase Strength and Stamina;Understanding of Exercise Prescription    Comments  Reviewed RPE scale, THR and program prescription with pt today.  Pt voiced understanding and was given a copy of goals to take home.   Jerry [name] is progressing well with exercise.  He has increased levels on all machines and uses 10 lb weights for strength work.    Jerry [name] is doing well in rehab.  He has switched to the morning class after a change in his schedule.  He seems to be doing well in class and aclimating to a new enviornment.  He is now up to 56 watts on the bike.  We will continue to monitor his progression.   Jerry [name] will be graduating early on Friday.  He is having a hard time fitting the program into his schedule while continuing to care for his wife. He plans to continue to walk at home to maintain his exercise.  He has a trail all the way around his property to walk.    Expected Outcomes  Short: Use RPE daily to regulate intensity.  Long: Follow program prescription in THR.  Short - Jerry [name] will attend HT 3 days per week  Long - Jerry [name] will see  continual improvement in MET levels  Short: Continue to try to increase workload on BioStep and treadmill.  Long: Continue to work on improving strength and stamina.   Shor: Graduation.  Long: Continue to exercise independently.        Discharge Exercise Prescription (Final Exercise Prescription Changes): Exercise Prescription Changes - 08/12/17 1500      Response to Exercise   Blood Pressure (Admit)  136/54    Blood Pressure (Exercise)  140/54    Blood Pressure (Exit)  116/54    Heart Rate (Admit)  76 bpm    Heart Rate (Exercise)  103 bpm    Heart Rate (Exit)  70 bpm    Rating of Perceived Exertion (Exercise)  12    Symptoms  none    Duration  Continue with 45 min of aerobic exercise without signs/symptoms of physical distress.    Intensity  THRR unchanged      Progression   Progression  Continue to progress workloads to maintain intensity without signs/symptoms of physical distress.    Average METs  4.29  Resistance Training   Training Prescription  Yes    Weight  10 lb    Reps  10-15      Interval Training   Interval Training  No      Treadmill   MPH  3    Grade  4    Minutes  15    METs  4.95      Bike   Level  9    Watts  56    Minutes  15    METs  4.1      Biostep-RELP   Level  6    Minutes  15    METs  4       Nutrition:  Target Goals: Understanding of nutrition guidelines, daily intake of sodium <1546m, cholesterol <2028m calories 30% from fat and 7% or less from saturated fats, daily to have 5 or more servings of fruits and vegetables.  Biometrics: Pre Biometrics - 07/06/17 1408      Pre Biometrics   Height  5' 11"  (1.803 m)    Weight  202 lb (91.6 kg)    Waist Circumference  41 inches    Hip Circumference  38.5 inches    Waist to Hip Ratio  1.06 %    BMI (Calculated)  28.19    Single Leg Stand  19.01 seconds      Post Biometrics - 08/19/17 0947       Post  Biometrics   Height  5' 11"  (1.803 m)    Weight  205 lb (93 kg)    Hip  Circumference  41 inches    BMI (Calculated)  28.6    Single Leg Stand  30 seconds       Nutrition Therapy Plan and Nutrition Goals: Nutrition Therapy & Goals - 07/06/17 1330      Nutrition Therapy   RD appointment deferred  Yes      Intervention Plan   Intervention  Prescribe, educate and counsel regarding individualized specific dietary modifications aiming towards targeted core components such as weight, hypertension, lipid management, diabetes, heart failure and other comorbidities.;Nutrition handout(s) given to patient.    Expected Outcomes  Short Term Goal: Understand basic principles of dietary content, such as calories, fat, sodium, cholesterol and nutrients.;Short Term Goal: A plan has been developed with personal nutrition goals set during dietitian appointment.;Long Term Goal: Adherence to prescribed nutrition plan.       Nutrition Assessments: Nutrition Assessments - 08/19/17 0729      MEDFICTS Scores   Pre Score  35    Post Score  79    Score Difference  44       Nutrition Goals Re-Evaluation: Nutrition Goals Re-Evaluation    Row Name 07/22/17 1706 08/19/17 0807           Goals   Current Weight  202 lb (91.6 kg)  -      Nutrition Goal  Follow nutrition guidelines related to his diagnosis by coming to class and learning how to apply eating habits to his daily lifestyle. He is still deferring the RD appointment.   Follow nutrition guidelines related to his diagnosis by coming to class and learning how to apply eating habits to his daily lifestyle. He is still deferring the RD appointment.       Comment  Jerry [name] been changing his eating habits for a while now. He has lost weight independently and is happy how he has changed his diet.   Jerry [name] to  keep a close eye on his diet.  He is very dilegent about watching his intake with his diabetes and now he has added watching sodium as well.  He felt he was already doing well with his diet and just added a few  more tweaks.      Expected Outcome  Short: come to the Nutrition education offered in [facility]. Long: incorporate the things he learns in class to his already improving eating habits.   Short: Graduate  Long: Continue to follow heart healthy and diabetic diet         Nutrition Goals Discharge (Final Nutrition Goals Re-Evaluation): Nutrition Goals Re-Evaluation - 08/19/17 0807      Goals   Nutrition Goal  Follow nutrition guidelines related to his diagnosis by coming to class and learning how to apply eating habits to his daily lifestyle. He is still deferring the RD appointment.     Comment  Jerry [name] continues to keep a close eye on his diet.  He is very dilegent about watching his intake with his diabetes and now he has added watching sodium as well.  He felt he was already doing well with his diet and just added a few more tweaks.    Expected Outcome  Short: Graduate  Long: Continue to follow heart healthy and diabetic diet       Psychosocial: Target Goals: Acknowledge presence or absence of significant depression and/or stress, maximize coping skills, provide positive support system. Participant is able to verbalize types and ability to use techniques and skills needed for reducing stress and depression.   Initial Review & Psychosocial Screening: Initial Psych Review & Screening - 07/06/17 1331      Initial Review   Current issues with  History of Depression;Current Anxiety/Panic;Current Stress Concerns    Source of Stress Concerns  Poor Coping Skills    Comments  Amyr suffers from serious PTSD which causes struggles daily. He does not like big groups, loud noises, small rooms, or feeling crowded in any way.       Family Dynamics   Good Support System?  Yes reports his wife is supportive      Screening Interventions   Interventions  Yes;Encouraged to exercise;Program counselor consult    Expected Outcomes  Short Term goal: Utilizing psychosocial counselor, staff and physician to  assist with identification of specific Stressors or current issues interfering with healing process. Setting desired goal for each stressor or current issue identified.;Long Term Goal: Stressors or current issues are controlled or eliminated.;Short Term goal: Identification and review with participant of any Quality of Life or Depression concerns found by scoring the questionnaire.;Long Term goal: The participant improves quality of Life and PHQ9 Scores as seen by post scores and/or verbalization of changes       Quality of Life Scores:  Quality of Life - 08/19/17 0728      Quality of Life Scores   Health/Function Pre  20.87 %    Health/Function Post  26.47 %    Health/Function % Change  26.83 %    Socioeconomic Pre  21 %    Socioeconomic Post  28.93 %    Socioeconomic % Change   37.76 %    Psych/Spiritual Pre  21 %    Psych/Spiritual Post  30 %    Psych/Spiritual % Change  42.86 %    Family Pre  21 %    Family Post  27 %    Family % Change  28.57 %  GLOBAL Pre  20.94 %    GLOBAL Post  27.8 %    GLOBAL % Change  32.76 %      Scores of 19 and below usually indicate a poorer quality of life in these areas.  A difference of  2-3 points is a clinically meaningful difference.  A difference of 2-3 points in the total score of the Quality of Life Index has been associated with significant improvement in overall quality of life, self-image, physical symptoms, and general health in studies assessing change in quality of life.  PHQ-9: Recent Review Flowsheet Data    Depression screen [facility] 2/9 08/19/2017 07/06/2017   Decreased Interest 0 0   Down, Depressed, Hopeless 0 0   PHQ - 2 Score 0 0   Altered sleeping 0 0   Tired, decreased energy 1 1   Change in appetite 0 0   Feeling bad or failure about yourself  0 0   Trouble concentrating 0 0   Moving slowly or fidgety/restless 0 0   Suicidal thoughts 0 0   PHQ-9 Score 1 1   Difficult doing work/chores Somewhat difficult Somewhat difficult      Interpretation of Total Score  Total Score Depression Severity:  1-4 = Minimal depression, 5-9 = Mild depression, 10-14 = Moderate depression, 15-19 = Moderately severe depression, 20-27 = Severe depression   Psychosocial Evaluation and Intervention: Psychosocial Evaluation - 08/19/17 0931      Discharge Psychosocial Assessment & Intervention   Comments  Counselor met with Jerry [name] today for a discharge evaluation.  He plans to graduate this Friday - which is a little early.  But Jerry [name] reports his spouse's health issues and caring for her are limiting his sleep and causing more stress on him currently.  He also reports having experienced a great deal of progress in his improved breathing and increased strength.  He has also enjoyed the educational components and the social aspect once he changed classes.  Jerry [name] has a large track of land that he has developed a walking track on and plans to return to walking approximately 3 miles/day up to 4 times per week to maintain his progress.  He has returned to chopping wood again and feels good about his strength and stamina to do so.  Counselor commended Nayib on his progress made and positive self care while in this program.         Psychosocial Re-Evaluation: Psychosocial Re-Evaluation    Row Name 07/22/17 1710 08/19/17 0811           Psychosocial Re-Evaluation   Current issues with  Current Stress Concerns  Current Stress Concerns      Comments  Jerry [name]'s PTSD is still a concern, but he reports it becoming easier to come to class and is actually enjoying himself. He expresses his main stressor currently is his wife's health. She suffers from Pulmonary Fibrosis and is constantly in and out of doctor visits or procedures. He states he has to drive her everywhere and take care of the house because of her exhaustion.   Jerry [name] continues to suffer from his PTSD but he has gotten better at coming to class.  He even switched classes and was still open  to talking to his new classmates.  He made some friends while in class and enjoyed coming.  He has decided to graduate early as he is having a hard time getting here and caring for his wife and coming to class was adding to his stress.  He plans to graduate on Friday.  We talked about the importance of making sure he takes time for himself and continuing to exercise to help manage his PTSD.        Expected Outcomes  Short: to learn ways to cope with his wife's diagnosis by coming to education days and to keep taking time for himself to come exercise. Long: Continue to take time to work on his health post Jerry [name].   Short: Graduate  Long: Continue to care for self while caring for his wife.       Interventions  Relaxation education;Encouraged to attend Cardiac Rehabilitation for the exercise;Stress management education  -      Comments  -  Elin suffers from serious PTSD which causes struggles daily. He does not like big groups, loud noises, small rooms, or feeling crowded in any way.         Initial Review   Source of Stress Concerns  -  Poor Coping Skills         Psychosocial Discharge (Final Psychosocial Re-Evaluation): Psychosocial Re-Evaluation - 08/19/17 0811      Psychosocial Re-Evaluation   Current issues with  Current Stress Concerns    Comments  Tzion continues to suffer from his PTSD but he has gotten better at coming to class.  He even switched classes and was still open to talking to his new classmates.  He made some friends while in class and enjoyed coming.  He has decided to graduate early as he is having a hard time getting here and caring for his wife and coming to class was adding to his stress.  He plans to graduate on Friday.  We talked about the importance of making sure he takes time for himself and continuing to exercise to help manage his PTSD.      Expected Outcomes  Short: Graduate  Long: Continue to care for self while caring for his wife.     Comments  Jerry [name] suffers  from serious PTSD which causes struggles daily. He does not like big groups, loud noises, small rooms, or feeling crowded in any way.       Initial Review   Source of Stress Concerns  Poor Coping Skills       Vocational Rehabilitation: Provide vocational rehab assistance to qualifying candidates.   Vocational Rehab Evaluation & Intervention: Vocational Rehab - 07/06/17 1341      Initial Vocational Rehab Evaluation & Intervention   Assessment shows need for Vocational Rehabilitation  No       Education: Education Goals: Education classes will be provided on a variety of topics geared toward better understanding of heart health and risk factor modification. Participant will state understanding/return demonstration of topics presented as noted by education test scores.  Learning Barriers/Preferences: Learning Barriers/Preferences - 07/06/17 1339      Learning Barriers/Preferences   Learning Barriers  Hearing    Learning Preferences  Individual Instruction       Education Topics:  AED/CPR: - Group verbal and written instruction with the use of models to demonstrate the basic use of the AED with the basic ABC's of resuscitation.   Cardiac Rehab from 08/19/2017 in [facility] Cardiac and Pulmonary Rehab  Date  08/17/17  Educator  SB  Instruction Review Code  1- Verbalizes Understanding      General Nutrition Guidelines/Fats and Fiber: -Group instruction provided by verbal, written material, models and posters to present the general guidelines for heart healthy nutrition. Gives an explanation and review  of dietary fats and fiber.   Card[facility] Rehab from 08/19/2017 in [facility] Cardiac and Pulmonary Rehab  Date  08/03/17  Educator  PI  Instruction Review Code  1- Verbalizes Understanding      Controlling Sodium/Reading Food Labels: -Group verbal and written material supporting the discussion of sodium use in heart healthy nutrition. Review and explanation with models, verbal and written  materials for utilization of the food label.   Cardiac Rehab from 08/19/2017 in [facility] Cardiac and Pulmonary Rehab  Date  08/10/17  Educator  PI  Instruction Review Code  1- Verbalizes Understanding      Exercise Physiology & General Exercise Guidelines: - Group verbal and written instruction with models to review the exercise physiology of the cardiovascular system and associated critical values. Provides general exercise guidelines with specific guidelines to those with heart or lung disease.    Aerobic Exercise & Resistance Training: - Gives group verbal and written instruction on the various components of exercise. Focuses on aerobic and resistive training programs and the benefits of this training and how to safely progress through these programs..   Cardiac Rehab from 08/19/2017 in [facility] Cardiac and Pulmonary Rehab  Date  07/08/17  Educator  AS  Instruction Review Code  1- Verbalizes Understanding      Flexibility, Balance, Mind/Body Relaxation: Provides group verbal/written instruction on the benefits of flexibility and balance training, including mind/body exercise modes such as yoga, pilates and tai chi.  Demonstration and skill practice provided.   Stress and Anxiety: - Provides group verbal and written instruction about the health risks of elevated stress and causes of high stress.  Discuss the correlation between heart/lung disease and anxiety and treatment options. Review healthy ways to manage with stress and anxiety.   Depression: - Provides group verbal and written instruction on the correlation between heart/lung disease and depressed mood, treatment options, and the stigmas associated with seeking treatment.   Anatomy & Physiology of the Heart: - Group verbal and written instruction and models provide basic cardiac anatomy and physiology, with the coronary electrical and arterial systems. Review of Valvular disease and Heart Failure   Cardiac Procedures: - Group  verbal and written instruction to review commonly prescribed medications for heart disease. Reviews the medication, class of the drug, and side effects. Includes the steps to properly store meds and maintain the prescription regimen. (beta blockers and nitrates)   Cardiac Medications I: - Group verbal and written instruction to review commonly prescribed medications for heart disease. Reviews the medication, class of the drug, and side effects. Includes the steps to properly store meds and maintain the prescription regimen.   Cardiac Rehab from 08/19/2017 in [facility] Cardiac and Pulmonary Rehab  Date  07/22/17 Marisue Humble 2]  Educator  CE  Instruction Review Code  1- Verbalizes Understanding      Cardiac Medications II: -Group verbal and written instruction to review commonly prescribed medications for heart disease. Reviews the medication, class of the drug, and side effects. (all other drug classes)   Cardiac Rehab from 08/19/2017 in [facility] Cardiac and Pulmonary Rehab  Date  07/15/17 [facility] Factors]  Educator  [employer]  Instruction Review Code  1- Verbalizes Understanding       Go Sex-Intimacy & Heart Disease, Get SMART - Goal Setting: - Group verbal and written instruction through game format to discuss heart disease and the return to sexual intimacy. Provides group verbal and written material to discuss and apply goal setting through the application of the S.M.A.R.T. Method.  Other Matters of the Heart: - Provides group verbal, written materials and models to describe Stable Angina and Peripheral Artery. Includes description of the disease process and treatment options available to the cardiac patient.   Exercise & Equipment Safety: - Individual verbal instruction and demonstration of equipment use and safety with use of the equipment.   Cardiac Rehab from 08/19/2017 in [facility] Cardiac and Pulmonary Rehab  Date  07/06/17  Educator  [facility]  Instruction Review Code  1- Verbalizes Understanding       Infection Prevention: - Provides verbal and written material to individual with discussion of infection control including proper hand washing and proper equipment cleaning during exercise session.   Cardiac Rehab from 08/19/2017 in [facility] Cardiac and Pulmonary Rehab  Date  07/06/17  Educator  [facility]  Instruction Review Code  1- Verbalizes Understanding      Falls Prevention: - Provides verbal and written material to individual with discussion of falls prevention and safety.   Cardiac Rehab from 08/19/2017 in [facility] Cardiac and Pulmonary Rehab  Date  07/06/17  Educator  [facility]  Instruction Review Code  1- Verbalizes Understanding      Diabetes: - Individual verbal and written instruction to review signs/symptoms of diabetes, desired ranges of glucose level fasting, after meals and with exercise. Acknowledge that pre and post exercise glucose checks will be done for 3 sessions at entry of program.   Cardiac Rehab from 08/19/2017 in [facility] Cardiac and Pulmonary Rehab  Date  07/06/17  Educator  [facility]  Instruction Review Code  1- Verbalizes Understanding      Know Your Numbers and Risk Factors: -Group verbal and written instruction about important numbers in your health.  Discussion of what are risk factors and how they play a role in the disease process.  Review of Cholesterol, Blood Pressure, Diabetes, and BMI and the role they play in your overall health.   Cardiac Rehab from 08/19/2017 in [facility] Cardiac and Pulmonary Rehab  Date  07/15/17 [facility] Factors]  Educator  [employer]  Instruction Review Code  1- [employer] Understanding      Sleep Hygiene: -Provides group verbal and written instruction about how sleep can affect your health.  Define sleep hygiene, discuss sleep cycles and impact of sleep habits. Review good sleep hygiene tips.    Cardiac Rehab from 08/19/2017 in [facility] Cardiac and Pulmonary Rehab  Date  08/12/17  Educator  [facility]  Instruction Review Code  1- Verbalizes Understanding       Other: -Provides group and verbal instruction on various topics (see comments)   Knowledge Questionnaire Score: Knowledge Questionnaire Score - 08/19/17 0728      Knowledge Questionnaire Score   Pre Score  26/28    Post Score  27/28       Core Components/Risk Factors/Patient Goals at Admission: Personal Goals and Risk Factors at Admission - 07/06/17 1329      Core Components/Risk Factors/Patient Goals on Admission   Diabetes  Yes    Intervention  Provide education about signs/symptoms and action to take for hypo/hyperglycemia.;Provide education about proper nutrition, including hydration, and aerobic/resistive exercise prescription along with prescribed medications to achieve blood glucose in normal ranges: Fasting glucose 65-99 mg/dL    Expected Outcomes  Short Term: Participant verbalizes understanding of the signs/symptoms and immediate care of hyper/hypoglycemia, proper foot care and importance of medication, aerobic/resistive exercise and nutrition plan for blood glucose control.;Long Term: Attainment of HbA1C < 7%.    Hypertension  Yes    Intervention  Provide education on lifestyle modifcations including regular physical activity/exercise, weight management, moderate sodium restriction and increased consumption of fresh fruit, vegetables, and low fat dairy, alcohol moderation, and smoking cessation.;Monitor prescription use compliance.    Expected Outcomes  Short Term: Continued assessment and intervention until BP is < 140/77m HG in hypertensive participants. < 130/870mHG in hypertensive participants with diabetes, heart failure or chronic kidney disease.;Long Term: Maintenance of blood pressure at goal levels.    Lipids  Yes    Intervention  Provide education and support for participant on nutrition & aerobic/resistive exercise along with prescribed medications to achieve LDL <7057mHDL >53m75m  Expected Outcomes  Short Term: Participant states understanding of desired  cholesterol values and is compliant with medications prescribed. Participant is following exercise prescription and nutrition guidelines.;Long Term: Cholesterol controlled with medications as prescribed, with individualized exercise RX and with personalized nutrition plan. Value goals: LDL < 70mg74mL > 40 mg.    Stress  Yes Jerry [name] from severe PTSD.     Intervention  Offer individual and/or small group education and counseling on adjustment to heart disease, stress management and health-related lifestyle change. Teach and support self-help strategies.;Refer participants experiencing significant psychosocial distress to appropriate mental health specialists for further evaluation and treatment. When possible, include family members and significant others in education/counseling sessions.    Expected Outcomes  Short Term: Participant demonstrates changes in health-related behavior, relaxation and other stress management skills, ability to obtain effective social support, and compliance with psychotropic medications if prescribed.;Long Term: Emotional wellbeing is indicated by absence of clinically significant psychosocial distress or social isolation.       Core Components/Risk Factors/Patient Goals Review:  Goals and Risk Factor Review    Row Name 07/22/17 1701 08/19/17 0804           Core Components/Risk Factors/Patient Goals Review   Personal Goals Review  Diabetes;Stress;Hypertension;Lipids  Diabetes;Stress;Hypertension;Lipids;Weight Management/Obesity      Review  RoberWendelbeen able to see a change in his health since starting [facility]. His weight has gone down some, although that isn't a primary goal for him, his CBG numbers are getting better as well. He has noticed his blood pressure has slowly become lower. His main stress factor right now is his wife who has Pulmonary Fibrosis. He is responsible for caring for her and managing the house. In relation to his PTSD, he is slowly  becoming more comfortable with this environment and the program, he reports that he looks forward to coming to class now.   Jerry [name] well in rehab.  His weight has stayed between 202-205 lbs.  He continues to keep a close eye on his blood pressure and blood sugars at home.  He would llike to check his sugars more, but [place] [facility] [place]  He does check his blood pressure daily.  He continues to care for his wife as well.       Expected Outcomes  [facility]. Long: continue with the new lifestyle changes he is making post [facility]  Short: Graduate!  Long: Continue to work on risk factor modificiations.          Core Components/Risk Factors/Patient Goals at Discharge (Final Review):  Goals and Risk Factor Review - 08/19/17 0804      Core Components/Risk Factors/Patient Goals Review   Personal Goals Review  Diabetes;Stress;Hypertension;Lipids;Weight  Management/Obesity    Review  Jerry [name] has domne well in rehab.  His weight has stayed between 202-205 lbs.  He continues to keep a close eye on his blood pressure and blood sugars at home.  He would llike to check his sugars more, but does not get enough strips from the [place] to check daily.  He does check his blood pressure daily.  He continues to care for his wife as well.     Expected Outcomes  Short: Graduate!  Long: Continue to work on risk factor modificiations.        ITP Comments: ITP Comments    Row Name 07/06/17 1215 07/08/17 1042 07/09/17 1740 08/05/17 0637 08/17/17 0758   ITP Comments  Med Review completed. Initial ITP created. Diagnosis can be found in Media tab from [place] 04/17/17  30 day review. Continue with ITP unless directed changes per Medical Director review.  New to program  Patient states he will not be here January 14th.  30 Day review. Continue with ITP unless directed changes per Medical Director review.   Jerry [name]  requested an early graduation.  He is planning to graduate on Friday of this week.  He was given homework today and we will do his walk test on Wednesday to graduate on Friday.    Jerry [name] Name 08/21/17 0930           ITP Comments  Discharge ITP sent and signed by Dr. Sabra Heck.  Discharge Summary routed to PCP and cardiologist.          Comments: Discharge ITP

## 2017-08-21 NOTE — Progress Notes (Signed)
Daily Session Note  Patient Details  Name: Jerry [name] MRN: [id number] Date of Birth: September 20, 1950 Referring Provider:     Cardiac Rehab from 07/06/2017 in [facility] Cardiac and Pulmonary Rehab  Referring Provider  [name] MD ([place])      Encounter Date: 08/21/2017  Check In: Session Check In - 08/21/17 0930      Check-In   Location  [facility]-Cardiac & Pulmonary Rehab    Staff Present  Alberteen Sam, MA, RCEP, [place], Exercise Physiologist;Meredith Sherryll Burger, RN Vickki Hearing, BA, ACSM CEP, Exercise Physiologist    Supervising physician immediately available to respond to emergencies  See telemetry face sheet for immediately available ER MD    Medication changes reported      No    Fall or balance concerns reported     No    Warm-up and Cool-down  Performed on first and last piece of equipment    Resistance Training Performed  Yes    VAD Patient?  No      Pain Assessment   Currently in Pain?  No/denies          Social History   Tobacco Use  Smoking Status Never Smoker  Smokeless Tobacco Never Used    Goals Met:  Independence with exercise equipment Exercise tolerated well Personal goals reviewed No report of cardiac concerns or symptoms Strength training completed today  Goals Unmet:  Not Applicable  Comments: Pt able to follow exercise prescription today without complaint.  Will continue to monitor for progression.  Jerry [name] graduated today from  rehab with 26 sessions completed.  Details of the patient's exercise prescription and what He needs to do in order to continue the prescription and progress were discussed with patient.  Patient was given a copy of prescription and goals.  Patient verbalized understanding.  Jerry [name] plans to continue to exercise by walking his trail at home.    Dr. Emily Filbert is Medical Director for [place] and [facility].

## 2017-08-21 NOTE — Progress Notes (Signed)
Discharge Progress Report  Patient Details  Name: Jerry [name] MRN: [id number] Date of Birth: 21-Nov-1950 Referring Provider:     Cardiac Rehab from 07/06/2017 in [facility]  Referring Provider  [name] MD ([place])       Number of Visits: 26/36  Reason for Discharge:  Patient reached a stable level of exercise. Patient independent in their exercise. Patient has met program and personal goals. Early Exit:  Personal  Smoking History:  Social History   Tobacco Use  Smoking Status Never Smoker  Smokeless Tobacco Never Used    Diagnosis:  Status post coronary artery stent placement  ADL UCSD:   Initial Exercise Prescription: Initial Exercise Prescription - 07/06/17 1300      Date of Initial Exercise RX and Referring Provider   Date  07/06/17    Referring Provider  [name] ([facility])      Treadmill   MPH  2.5    Grade  1    Minutes  15    METs  3.26      Bike   Level  1    Watts  37    Minutes  15    METs  3.2      Biostep-RELP   Level  3    SPM  50    Minutes  15    METs  3      Prescription Details   Frequency (times per week)  3    Duration  Progress to 45 minutes of aerobic exercise without signs/symptoms of physical distress      Intensity   THRR 40-80% of Max Heartrate  98-135    Ratings of Perceived Exertion  11-13    Perceived Dyspnea  0-4      Progression   Progression  Continue to progress workloads to maintain intensity without signs/symptoms of physical distress.      Resistance Training   Training Prescription  Yes    Weight  3 lbs    Reps  10-15       Discharge Exercise Prescription (Final Exercise Prescription Changes): Exercise Prescription Changes - 08/12/17 1500      Response to Exercise   Blood Pressure (Admit)  136/54    Blood Pressure (Exercise)  140/54    Blood Pressure (Exit)  116/54    Heart Rate (Admit)  76 bpm    Heart Rate (Exercise)  103 bpm    Heart Rate  (Exit)  70 bpm    Rating of Perceived Exertion (Exercise)  12    Symptoms  none    Duration  Continue with 45 min of aerobic exercise without signs/symptoms of physical distress.    Intensity  THRR unchanged      Progression   Progression  Continue to progress workloads to maintain intensity without signs/symptoms of physical distress.    Average METs  4.29      Resistance Training   Training Prescription  Yes    Weight  10 lb    Reps  10-15      Interval Training   Interval Training  No      Treadmill   MPH  3    Grade  4    Minutes  15    METs  4.95      Bike   Level  9    Watts  56    Minutes  15    METs  4.1  Biostep-RELP   Level  6    Minutes  15    METs  4       Functional Capacity: 6 Minute Walk    Row Name 07/06/17 1347 08/19/17 0946       6 Minute Walk   Phase  Initial  Discharge    Distance  1400 feet  1800 feet    Distance % Change  -  28.6 %    Distance Feet Change  -  400 ft    Walk Time  6 minutes  6 minutes    # of Rest Breaks  0  0    MPH  2.65  3.41    METS  3.28  4.66    RPE  12  15    VO2 Peak  11.47  16.3    Symptoms  No  No    Resting HR  62 bpm  65 bpm    Resting BP  122/60  134/64    Resting Oxygen Saturation   98 %  -    Exercise Oxygen Saturation  during 6 min walk  98 %  -    Max Ex. HR  103 bpm  132 bpm    Max Ex. BP  130/56  174/52    2 Minute Post BP  124/64  -       Psychological, QOL, Others - Outcomes: PHQ 2/9: Depression screen [facility] 2/9 08/19/2017 07/06/2017  Decreased Interest 0 0  Down, Depressed, Hopeless 0 0  PHQ - 2 Score 0 0  Altered sleeping 0 0  Tired, decreased energy 1 1  Change in appetite 0 0  Feeling bad or failure about yourself  0 0  Trouble concentrating 0 0  Moving slowly or fidgety/restless 0 0  Suicidal thoughts 0 0  PHQ-9 Score 1 1  Difficult doing work/chores Somewhat difficult Somewhat difficult    Quality of Life: Quality of Life - 08/19/17 0728      Quality of Life Scores    Health/Function Pre  20.87 %    Health/Function Post  26.47 %    Health/Function % Change  26.83 %    Socioeconomic Pre  21 %    Socioeconomic Post  28.93 %    Socioeconomic % Change   37.76 %    Psych/Spiritual Pre  21 %    Psych/Spiritual Post  30 %    Psych/Spiritual % Change  42.86 %    Family Pre  21 %    Family Post  27 %    Family % Change  28.57 %    GLOBAL Pre  20.94 %    GLOBAL Post  27.8 %    GLOBAL % Change  32.76 %       Personal Goals: Goals established at orientation with interventions provided to work toward goal. Personal Goals and Risk Factors at Admission - 07/06/17 1329      Core Components/Risk Factors/Patient Goals on Admission   Diabetes  Yes    Intervention  Provide education about signs/symptoms and action to take for hypo/hyperglycemia.;Provide education about proper nutrition, including hydration, and aerobic/resistive exercise prescription along with prescribed medications to achieve blood glucose in normal ranges: Fasting glucose 65-99 mg/dL    Expected Outcomes  Short Term: Participant verbalizes understanding of the signs/symptoms and immediate care of hyper/hypoglycemia, proper foot care and importance of medication, aerobic/resistive exercise and nutrition plan for blood glucose control.;Long Term: Attainment of HbA1C < 7%.  Hypertension  Yes    Intervention  Provide education on lifestyle modifcations including regular physical activity/exercise, weight management, moderate sodium restriction and increased consumption of fresh fruit, vegetables, and low fat dairy, alcohol moderation, and smoking cessation.;Monitor prescription use compliance.    Expected Outcomes  Short Term: Continued assessment and intervention until BP is < 140/38m HG in hypertensive participants. < 130/869mHG in hypertensive participants with diabetes, heart failure or chronic kidney disease.;Long Term: Maintenance of blood pressure at goal levels.    Lipids  Yes    Intervention   Provide education and support for participant on nutrition & aerobic/resistive exercise along with prescribed medications to achieve LDL <7072mHDL >19m77m  Expected Outcomes  Short Term: Participant states understanding of desired cholesterol values and is compliant with medications prescribed. Participant is following exercise prescription and nutrition guidelines.;Long Term: Cholesterol controlled with medications as prescribed, with individualized exercise RX and with personalized nutrition plan. Value goals: LDL < 70mg72mL > 40 mg.    Stress  Yes Jerry [name] from severe PTSD.     Intervention  Offer individual and/or small group education and counseling on adjustment to heart disease, stress management and health-related lifestyle change. Teach and support self-help strategies.;Refer participants experiencing significant psychosocial distress to appropriate mental health specialists for further evaluation and treatment. When possible, include family members and significant others in education/counseling sessions.    Expected Outcomes  Short Term: Participant demonstrates changes in health-related behavior, relaxation and other stress management skills, ability to obtain effective social support, and compliance with psychotropic medications if prescribed.;Long Term: Emotional wellbeing is indicated by absence of clinically significant psychosocial distress or social isolation.        Personal Goals Discharge: Goals and Risk Factor Review    Row Name 07/22/17 1701 08/19/17 0804           Core Components/Risk Factors/Patient Goals Review   Personal Goals Review  Diabetes;Stress;Hypertension;Lipids  Diabetes;Stress;Hypertension;Lipids;Weight Management/Obesity      [place]  [place] see a change in his health since starting [facility]. His weight has gone down some, although that isn't a primary goal for him, his CBG numbers are getting better as well. He has noticed his blood  pressure has slowly become lower. His main stress factor right now is his wife who has Pulmonary Fibrosis. He is responsible for caring for her and managing the house. In relation to his PTSD, he is slowly becoming more comfortable with this environment and the program, he reports that he looks forward to coming to class now.   Jerry [name] well in rehab.  His weight has stayed between 202-205 lbs.  He continues to keep a close eye on his blood pressure and blood sugars at home.  He would llike to check his sugars more, but [place] [facility] [place] [facility] [place]  He does check his blood pressure daily.  He continues to care for his wife as well.       Expected Outcomes  [facility]. Long: continue with the new lifestyle changes he is making post [facility]  Short: Graduate!  Long: Continue to work on risk factor modificiations.          Exercise Goals and Review: Exercise Goals    Row Name 07/06/17 1407             Exercise Goals  Increase Physical Activity  Yes       Intervention  Provide advice, education, support and counseling about physical activity/exercise needs.;Develop an individualized exercise prescription for aerobic and resistive training based on initial evaluation findings, risk stratification, comorbidities and participant's personal goals.       Expected Outcomes  Achievement of increased cardiorespiratory fitness and enhanced flexibility, muscular endurance and strength shown through measurements of functional capacity and personal statement of participant.       Increase Strength and Stamina  Yes       Intervention  Provide advice, education, support and counseling about physical activity/exercise needs.;Develop an individualized exercise prescription for aerobic and resistive training based on initial evaluation findings, risk stratification, comorbidities and participant's  personal goals.       Expected Outcomes  Achievement of increased cardiorespiratory fitness and enhanced flexibility, muscular endurance and strength shown through measurements of functional capacity and personal statement of participant.       Able to understand and use rate of perceived exertion (RPE) scale  Yes       Intervention  Provide education and explanation on how to use RPE scale       Expected Outcomes  Short Term: Able to use RPE daily in rehab to express subjective intensity level;Long Term:  Able to use RPE to guide intensity level when exercising independently       Knowledge and understanding of Target Heart Rate Range (THRR)  Yes       Intervention  Provide education and explanation of THRR including how the numbers were predicted and where they are located for reference       Expected Outcomes  Short Term: Able to state/look up THRR;Long Term: Able to use THRR to govern intensity when exercising independently;Short Term: Able to use daily as guideline for intensity in rehab       Able to check pulse independently  Yes       Intervention  Provide education and demonstration on how to check pulse in carotid and radial arteries.;Review the importance of being able to check your own pulse for safety during independent exercise       Expected Outcomes  Short Term: Able to explain why pulse checking is important during independent exercise;Long Term: Able to check pulse independently and accurately       Understanding of Exercise Prescription  Yes       Intervention  Provide education, explanation, and written materials on patient's individual exercise prescription       Expected Outcomes  Short Term: Able to explain program exercise prescription;Long Term: Able to explain home exercise prescription to exercise independently          Nutrition & Weight - Outcomes: Pre Biometrics - 07/06/17 1408      Pre Biometrics   Height  5' 11"  (1.803 m)    Weight  202 lb (91.6 kg)    Waist  Circumference  41 inches    Hip Circumference  38.5 inches    Waist to Hip Ratio  1.06 %    BMI (Calculated)  28.19    Single Leg Stand  19.01 seconds      Post Biometrics - 08/19/17 0947       Post  Biometrics   Height  5' 11"  (1.803 m)    Weight  205 lb (93 kg)    Hip Circumference  41 inches    BMI (Calculated)  28.6    Single Leg Stand  30 seconds  Nutrition: Nutrition Therapy & Goals - 07/06/17 1330      Nutrition Therapy   RD appointment deferred  Yes      Intervention Plan   Intervention  Prescribe, educate and counsel regarding individualized specific dietary modifications aiming towards targeted core components such as weight, hypertension, lipid management, diabetes, heart failure and other comorbidities.;Nutrition handout(s) given to patient.    Expected Outcomes  Short Term Goal: Understand basic principles of dietary content, such as calories, fat, sodium, cholesterol and nutrients.;Short Term Goal: A plan has been developed with personal nutrition goals set during dietitian appointment.;Long Term Goal: Adherence to prescribed nutrition plan.       Nutrition Discharge: Nutrition Assessments - 08/19/17 0729      MEDFICTS Scores   Pre Score  35    Post Score  79    Score Difference  44       Education Questionnaire Score: Knowledge Questionnaire Score - 08/19/17 0728      Knowledge Questionnaire Score   Pre Score  26/28    Post Score  27/28       Goals reviewed with patient; copy given to patient.

## 2019-01-31 ENCOUNTER — Other Ambulatory Visit: Payer: Self-pay

## 2019-01-31 ENCOUNTER — Other Ambulatory Visit: Payer: Non-veteran care

## 2019-01-31 DIAGNOSIS — Z20822 Contact with and (suspected) exposure to covid-19: Secondary | ICD-10-CM

## 2019-02-02 LAB — NOVEL CORONAVIRUS, NAA: SARS-CoV-2, NAA: NOT DETECTED

## 2019-02-07 ENCOUNTER — Telehealth: Payer: Self-pay | Admitting: General Practice

## 2019-02-07 NOTE — Telephone Encounter (Signed)
Pt called in , gave him neg covid results, pt expressed understanding

## 2019-08-18 ENCOUNTER — Other Ambulatory Visit: Payer: Self-pay | Admitting: Surgery

## 2019-08-29 ENCOUNTER — Encounter
Admission: RE | Admit: 2019-08-29 | Discharge: 2019-08-29 | Disposition: A | Discharge status: Home / Self Care | Payer: No Typology Code available for payment source | Source: Ambulatory Visit | Attending: Surgery | Admitting: Surgery

## 2019-08-29 DIAGNOSIS — Z01818 Encounter for other preprocedural examination: Secondary | ICD-10-CM | POA: Insufficient documentation

## 2019-08-29 HISTORY — DX: Post-traumatic stress disorder, unspecified: F43.10

## 2019-08-29 HISTORY — DX: Unspecified osteoarthritis, unspecified site: M19.90

## 2019-08-29 HISTORY — DX: Acute embolism and thrombosis of unspecified vein: I82.90

## 2019-08-29 HISTORY — DX: Headache, unspecified: R51.9

## 2019-08-29 HISTORY — DX: Gastro-esophageal reflux disease without esophagitis: K21.9

## 2019-08-29 HISTORY — DX: Personal history of other diseases of the digestive system: Z87.19

## 2019-08-29 NOTE — Patient Instructions (Addendum)
Your procedure is scheduled on: 09-01-19 THURSDAY Report to Same Day Surgery 2nd floor medical mall [facility] Entrance-take elevator on left to 2nd floor.  Check in with surgery information desk.) To find out your arrival time please call [phone] between 1PM - 3PM on 08-31-19 [facility]  Remember: Instructions that are not followed completely may result in serious medical risk, up to and including death, or upon the discretion of your surgeon and anesthesiologist your surgery may need to be rescheduled.    _x___ 1. Do not eat food after midnight the night before your procedure. NO GUM OR CANDY AFTER MIDNIGHT. You may drink WATER up to 2 hours before you are scheduled to arrive at the hospital for your procedure.  Do not drink WATER within 2 hours of your scheduled arrival to the hospital.  Type 1 and type 2 diabetics should only drink water.   ____Ensure clear carbohydrate drink on the way to the hospital for bariatric patients  _X___GATORADE G2 DRINK 2 HOURS PRIOR TO [place]    __x__ 2. No Alcohol for 24 hours before or after surgery.   __x__3. No Smoking or e-cigarettes for 24 prior to surgery.  Do not use any chewable tobacco products for at least 6 hour prior to surgery   ____  4. Bring all medications with you on the day of surgery if instructed.    __x__ 5. Notify your doctor if there is any change in your medical condition     (cold, fever, infections).    x___6. On the morning of surgery brush your teeth with toothpaste and water.  You may rinse your mouth with mouth wash if you wish.  Do not swallow any toothpaste or mouthwash.   Do not wear jewelry, make-up, hairpins, clips or nail polish.  Do not wear lotions, powders, or perfumes.   Do not shave 48 hours prior to surgery. Men may shave face and neck.  Do not bring valuables to the hospital.    [facility] is not responsible for any belongings or valuables.               Contacts, dentures or  bridgework may not be worn into surgery.  Leave your suitcase in the car. After surgery it may be brought to your room.  For patients admitted to the hospital, discharge time is determined by your treatment team.  _  Patients discharged the day of surgery will not be allowed to drive home.  You will need someone to drive you home and stay with you the night of your procedure.    Please read over the following fact sheets that you were given:   [facility] Preparing for Surgery and MRSA Information /INCENTIVE SPIROMETER-BRING THIS TO [place]  _x___ TAKE THE FOLLOWING MEDICATION THE MORNING OF SURGERY WITH A SMALL SIP OF WATER. These include:  1. PROSCAR (FINASTERIDE)  2. ISOSORBIDE DINITRATE  3. RIFAXIMIN (XIFAXAN)  4.YOU MAY TAKE ACETAMINOPHEN(TYLEONOL) DAY OF SURGERY IF NEEDED  5.  6.  ____Fleets enema or Magnesium Citrate as directed.   _x___ Use CHG SOAP/BENZOYL PEROXIDE GEL as directed on instruction sheet   ____ Use inhalers on the day of surgery and bring to hospital day of surgery  _X___ Stop Metformin 2 days prior to surgery-LAST DOSE TODAY (08-29-19)   ____ Take 1/2 of usual insulin dose the night before surgery and none on the morning surgery.   _x___ Follow recommendations from Cardiologist, Pulmonologist or PCP regarding stopping  Aspirin, Coumadin, Plavix ,Eliquis, Effient, or Pradaxa, and Pletal-CALL DR POGGI'S OFFICE ABOUT STOPPING YOUR ASPIRIN  X____Stop Anti-inflammatories such as Advil, Aleve, Ibuprofen, Motrin, Naproxen, Naprosyn, Goodies powders or aspirin products NOW-OK to take Tylenol (ACETAMINOPHEN)   ____ Stop supplements until after surgery.     ____ Bring C-Pap to the hospital.

## 2019-08-30 ENCOUNTER — Other Ambulatory Visit: Admission: RE | Admit: 2019-08-30 | Payer: Non-veteran care | Source: Ambulatory Visit

## 2019-08-30 ENCOUNTER — Encounter
Admission: RE | Admit: 2019-08-30 | Discharge: 2019-08-30 | Disposition: A | Discharge status: Home / Self Care | Payer: No Typology Code available for payment source | Source: Ambulatory Visit | Attending: Surgery | Admitting: Surgery

## 2019-08-30 ENCOUNTER — Other Ambulatory Visit: Payer: Self-pay

## 2019-08-30 DIAGNOSIS — Z01812 Encounter for preprocedural laboratory examination: Secondary | ICD-10-CM | POA: Insufficient documentation

## 2019-08-30 DIAGNOSIS — Z20822 Contact with and (suspected) exposure to covid-19: Secondary | ICD-10-CM | POA: Insufficient documentation

## 2019-08-30 DIAGNOSIS — Z01810 Encounter for preprocedural cardiovascular examination: Secondary | ICD-10-CM

## 2019-08-30 DIAGNOSIS — I4510 Unspecified right bundle-branch block: Secondary | ICD-10-CM | POA: Insufficient documentation

## 2019-08-30 LAB — TYPE AND SCREEN
ABO/RH(D): B POS
Antibody Screen: NEGATIVE

## 2019-08-30 LAB — URINALYSIS, ROUTINE W REFLEX MICROSCOPIC
Bacteria, UA: NONE SEEN
Bilirubin Urine: NEGATIVE
Glucose, UA: NEGATIVE mg/dL
Hgb urine dipstick: NEGATIVE
Ketones, ur: NEGATIVE mg/dL
Leukocytes,Ua: NEGATIVE
Nitrite: NEGATIVE
Protein, ur: 100 mg/dL — AB
Specific Gravity, Urine: 1.019 (ref 1.005–1.030)
pH: 5 (ref 5.0–8.0)

## 2019-08-30 LAB — CBC WITH DIFFERENTIAL/PLATELET
Abs Immature Granulocytes: 0.03 10*3/uL (ref 0.00–0.07)
Basophils Absolute: 0.1 10*3/uL (ref 0.0–0.1)
Basophils Relative: 1 %
Eosinophils Absolute: 0.3 10*3/uL (ref 0.0–0.5)
Eosinophils Relative: 4 %
HCT: 41 % (ref 39.0–52.0)
Hemoglobin: 13.5 g/dL (ref 13.0–17.0)
Immature Granulocytes: 1 %
Lymphocytes Relative: 23 %
Lymphs Abs: 1.4 10*3/uL (ref 0.7–4.0)
MCH: 31.1 pg (ref 26.0–34.0)
MCHC: 32.9 g/dL (ref 30.0–36.0)
MCV: 94.5 fL (ref 80.0–100.0)
Monocytes Absolute: 0.5 10*3/uL (ref 0.1–1.0)
Monocytes Relative: 9 %
Neutro Abs: 3.7 10*3/uL (ref 1.7–7.7)
Neutrophils Relative %: 62 %
Platelets: 141 10*3/uL — ABNORMAL LOW (ref 150–400)
RBC: 4.34 MIL/uL (ref 4.22–5.81)
RDW: 13.2 % (ref 11.5–15.5)
WBC: 5.9 10*3/uL (ref 4.0–10.5)
nRBC: 0 % (ref 0.0–0.2)

## 2019-08-30 LAB — SARS CORONAVIRUS 2 (TAT 6-24 HRS): SARS Coronavirus 2: NEGATIVE

## 2019-08-30 LAB — COMPREHENSIVE METABOLIC PANEL
ALT: 55 U/L — ABNORMAL HIGH (ref 0–44)
AST: 59 U/L — ABNORMAL HIGH (ref 15–41)
Albumin: 3 g/dL — ABNORMAL LOW (ref 3.5–5.0)
Alkaline Phosphatase: 106 U/L (ref 38–126)
Anion gap: 6 (ref 5–15)
BUN: 9 mg/dL (ref 8–23)
CO2: 29 mmol/L (ref 22–32)
Calcium: 9 mg/dL (ref 8.9–10.3)
Chloride: 102 mmol/L (ref 98–111)
Creatinine, Ser: 0.74 mg/dL (ref 0.61–1.24)
GFR calc Af Amer: >60 mL/min (ref >60)
GFR calc non Af Amer: >60 mL/min (ref >60)
Glucose, Bld: 259 mg/dL — ABNORMAL HIGH (ref 70–99)
Potassium: 3.9 mmol/L (ref 3.5–5.1)
Sodium: 137 mmol/L (ref 135–145)
Total Bilirubin: 1.6 mg/dL — ABNORMAL HIGH (ref 0.3–1.2)
Total Protein: 6.7 g/dL (ref 6.5–8.1)

## 2019-08-30 LAB — SURGICAL PCR SCREEN
MRSA, PCR: NEGATIVE
Staphylococcus aureus: NEGATIVE

## 2019-08-30 NOTE — Pre-Procedure Instructions (Addendum)
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 Notified Dr Roland Rack of abnormal labs via secure chat.

## 2019-09-01 ENCOUNTER — Inpatient Hospital Stay: Payer: No Typology Code available for payment source

## 2019-09-01 ENCOUNTER — Inpatient Hospital Stay
Admission: RE | Admit: 2019-09-01 | Discharge: 2019-09-02 | DRG: 483 | Disposition: A | Discharge status: Home Health | Payer: No Typology Code available for payment source | Attending: Surgery | Admitting: Surgery

## 2019-09-01 ENCOUNTER — Inpatient Hospital Stay: Payer: No Typology Code available for payment source | Admitting: Anesthesiology

## 2019-09-01 ENCOUNTER — Encounter
Admission: RE | Disposition: A | Discharge status: Home / Self Care | Payer: Self-pay | Source: Home / Self Care | Attending: Surgery

## 2019-09-01 ENCOUNTER — Other Ambulatory Visit: Payer: Self-pay

## 2019-09-01 DIAGNOSIS — Z7902 Long term (current) use of antithrombotics/antiplatelets: Secondary | ICD-10-CM

## 2019-09-01 DIAGNOSIS — Z7982 Long term (current) use of aspirin: Secondary | ICD-10-CM | POA: Diagnosis not present

## 2019-09-01 DIAGNOSIS — Z880 Allergy status to penicillin: Secondary | ICD-10-CM

## 2019-09-01 DIAGNOSIS — M19012 Primary osteoarthritis, left shoulder: Secondary | ICD-10-CM | POA: Diagnosis present

## 2019-09-01 DIAGNOSIS — Z955 Presence of coronary angioplasty implant and graft: Secondary | ICD-10-CM | POA: Diagnosis not present

## 2019-09-01 DIAGNOSIS — Z20822 Contact with and (suspected) exposure to covid-19: Secondary | ICD-10-CM | POA: Diagnosis present

## 2019-09-01 DIAGNOSIS — E1165 Type 2 diabetes mellitus with hyperglycemia: Secondary | ICD-10-CM | POA: Diagnosis present

## 2019-09-01 DIAGNOSIS — F4310 Post-traumatic stress disorder, unspecified: Secondary | ICD-10-CM | POA: Diagnosis present

## 2019-09-01 DIAGNOSIS — K449 Diaphragmatic hernia without obstruction or gangrene: Secondary | ICD-10-CM | POA: Diagnosis present

## 2019-09-01 DIAGNOSIS — E7800 Pure hypercholesterolemia, unspecified: Secondary | ICD-10-CM | POA: Diagnosis present

## 2019-09-01 DIAGNOSIS — Z79899 Other long term (current) drug therapy: Secondary | ICD-10-CM | POA: Diagnosis not present

## 2019-09-01 DIAGNOSIS — Z419 Encounter for procedure for purposes other than remedying health state, unspecified: Secondary | ICD-10-CM

## 2019-09-01 DIAGNOSIS — Z888 Allergy status to other drugs, medicaments and biological substances status: Secondary | ICD-10-CM | POA: Diagnosis not present

## 2019-09-01 DIAGNOSIS — Z7984 Long term (current) use of oral hypoglycemic drugs: Secondary | ICD-10-CM | POA: Diagnosis not present

## 2019-09-01 DIAGNOSIS — M778 Other enthesopathies, not elsewhere classified: Secondary | ICD-10-CM | POA: Diagnosis present

## 2019-09-01 DIAGNOSIS — M75102 Unspecified rotator cuff tear or rupture of left shoulder, not specified as traumatic: Principal | ICD-10-CM | POA: Diagnosis present

## 2019-09-01 DIAGNOSIS — I2510 Atherosclerotic heart disease of native coronary artery without angina pectoris: Secondary | ICD-10-CM | POA: Diagnosis present

## 2019-09-01 DIAGNOSIS — Z96612 Presence of left artificial shoulder joint: Secondary | ICD-10-CM

## 2019-09-01 DIAGNOSIS — K219 Gastro-esophageal reflux disease without esophagitis: Secondary | ICD-10-CM | POA: Diagnosis present

## 2019-09-01 DIAGNOSIS — Z85528 Personal history of other malignant neoplasm of kidney: Secondary | ICD-10-CM | POA: Diagnosis not present

## 2019-09-01 DIAGNOSIS — I10 Essential (primary) hypertension: Secondary | ICD-10-CM | POA: Diagnosis present

## 2019-09-01 HISTORY — PX: REVERSE SHOULDER ARTHROPLASTY: SHX5054

## 2019-09-01 LAB — GLUCOSE, CAPILLARY
Glucose-Capillary: 114 mg/dL — ABNORMAL HIGH (ref 70–99)
Glucose-Capillary: 192 mg/dL — ABNORMAL HIGH (ref 70–99)
Glucose-Capillary: 475 mg/dL — ABNORMAL HIGH (ref 70–99)
Glucose-Capillary: 414 mg/dL — ABNORMAL HIGH (ref 70–99)

## 2019-09-01 LAB — GLUCOSE, RANDOM: Glucose, Bld: 430 mg/dL — ABNORMAL HIGH (ref 70–99)

## 2019-09-01 LAB — ABO/RH: ABO/RH(D): B POS

## 2019-09-01 SURGERY — ARTHROPLASTY, SHOULDER, TOTAL, REVERSE
Anesthesia: General | Site: Shoulder | Laterality: Left

## 2019-09-01 MED ORDER — MAGNESIUM HYDROXIDE 400 MG/5ML PO SUSP: 30.0000 mL | Freq: Every day | ORAL | Status: DC | PRN

## 2019-09-01 MED ORDER — FENTANYL CITRATE (PF) 100 MCG/2ML IJ SOLN: 25.0000 ug | INTRAMUSCULAR | Status: DC | PRN

## 2019-09-01 MED ORDER — ONDANSETRON HCL 4 MG PO TABS: 4.0000 mg | ORAL_TABLET | Freq: Four times a day (QID) | ORAL | Status: DC | PRN

## 2019-09-01 MED ORDER — DEXAMETHASONE SODIUM PHOSPHATE 10 MG/ML IJ SOLN
INTRAMUSCULAR | Status: DC | PRN
  Administered 2019-09-01: 10 mg via INTRAVENOUS

## 2019-09-01 MED ORDER — ACETAMINOPHEN 500 MG PO TABS
1000.0000 mg | ORAL_TABLET | Freq: Four times a day (QID) | ORAL | Status: AC
  Administered 2019-09-01 – 2019-09-02 (×4): 1000 mg via ORAL
  Filled 2019-09-01 (×4): qty 2

## 2019-09-01 MED ORDER — LISINOPRIL 5 MG PO TABS
2.5000 mg | ORAL_TABLET | Freq: Every day | ORAL | Status: DC
  Administered 2019-09-01: 2.5 mg via ORAL
  Filled 2019-09-01: qty 1

## 2019-09-01 MED ORDER — EPHEDRINE SULFATE 50 MG/ML IJ SOLN
INTRAMUSCULAR | Status: DC | PRN
  Administered 2019-09-01: 10 mg via INTRAVENOUS

## 2019-09-01 MED ORDER — GLIPIZIDE 5 MG PO TABS
2.5000 mg | ORAL_TABLET | Freq: Every day | ORAL | Status: DC
  Administered 2019-09-01 – 2019-09-02 (×2): 2.5 mg via ORAL
  Filled 2019-09-01 (×2): qty 0.5

## 2019-09-01 MED ORDER — BUPIVACAINE LIPOSOME 1.3 % IJ SUSP
INTRAMUSCULAR | Status: AC
  Filled 2019-09-01: qty 20

## 2019-09-01 MED ORDER — BUPIVACAINE-EPINEPHRINE (PF) 0.5% -1:200000 IJ SOLN
INTRAMUSCULAR | Status: DC | PRN
  Administered 2019-09-01: 30 mL via PERINEURAL

## 2019-09-01 MED ORDER — KETOROLAC TROMETHAMINE 15 MG/ML IJ SOLN
7.5000 mg | Freq: Four times a day (QID) | INTRAMUSCULAR | Status: AC
  Administered 2019-09-01 – 2019-09-02 (×3): 7.5 mg via INTRAVENOUS
  Filled 2019-09-01 (×3): qty 1

## 2019-09-01 MED ORDER — SODIUM CHLORIDE 0.9 % IV SOLN: INTRAVENOUS | Status: DC

## 2019-09-01 MED ORDER — PHENYLEPHRINE HCL-NACL 20-0.9 MG/250ML-% IV SOLN
INTRAVENOUS | Status: DC | PRN
  Administered 2019-09-01: 50 ug/min via INTRAVENOUS

## 2019-09-01 MED ORDER — PHENYLEPHRINE HCL (PRESSORS) 10 MG/ML IV SOLN
INTRAVENOUS | Status: AC
  Filled 2019-09-01: qty 1

## 2019-09-01 MED ORDER — METFORMIN HCL 500 MG PO TABS
500.0000 mg | ORAL_TABLET | Freq: Two times a day (BID) | ORAL | Status: DC
  Administered 2019-09-01 – 2019-09-02 (×3): 500 mg via ORAL
  Filled 2019-09-01 (×3): qty 1

## 2019-09-01 MED ORDER — FENTANYL CITRATE (PF) 100 MCG/2ML IJ SOLN
INTRAMUSCULAR | Status: AC
  Filled 2019-09-01: qty 2

## 2019-09-01 MED ORDER — DOCUSATE SODIUM 100 MG PO CAPS
100.0000 mg | ORAL_CAPSULE | Freq: Two times a day (BID) | ORAL | Status: DC
  Administered 2019-09-01 – 2019-09-02 (×2): 100 mg via ORAL
  Filled 2019-09-01 (×2): qty 1

## 2019-09-01 MED ORDER — BUPIVACAINE HCL (PF) 0.5 % IJ SOLN
INTRAMUSCULAR | Status: DC | PRN
  Administered 2019-09-01: 3 mL via PERINEURAL
  Administered 2019-09-01: 7 mL via PERINEURAL

## 2019-09-01 MED ORDER — FAMOTIDINE 20 MG PO TABS
ORAL_TABLET | ORAL | Status: AC
  Administered 2019-09-01: 07:00:00 20 mg via ORAL
  Filled 2019-09-01: qty 1

## 2019-09-01 MED ORDER — OXYCODONE HCL 5 MG PO TABS: 5.0000 mg | ORAL_TABLET | ORAL | Status: DC | PRN

## 2019-09-01 MED ORDER — FLEET ENEMA 7-19 GM/118ML RE ENEM: 1.0000 | ENEMA | Freq: Once | RECTAL | Status: DC | PRN

## 2019-09-01 MED ORDER — ROCURONIUM BROMIDE 100 MG/10ML IV SOLN
INTRAVENOUS | Status: DC | PRN
  Administered 2019-09-01: 50 mg via INTRAVENOUS

## 2019-09-01 MED ORDER — BUPIVACAINE HCL (PF) 0.5 % IJ SOLN
INTRAMUSCULAR | Status: AC
  Filled 2019-09-01: qty 10

## 2019-09-01 MED ORDER — CLINDAMYCIN PHOSPHATE 600 MG/50ML IV SOLN
600.0000 mg | Freq: Four times a day (QID) | INTRAVENOUS | Status: AC
  Administered 2019-09-01 – 2019-09-02 (×3): 600 mg via INTRAVENOUS
  Filled 2019-09-01 (×3): qty 50

## 2019-09-01 MED ORDER — INSULIN ASPART 100 UNIT/ML ~~LOC~~ SOLN
0.0000 [IU] | Freq: Three times a day (TID) | SUBCUTANEOUS | Status: DC
  Administered 2019-09-01: 15 [IU] via SUBCUTANEOUS
  Filled 2019-09-01: qty 1

## 2019-09-01 MED ORDER — PROPOFOL 10 MG/ML IV BOLUS
INTRAVENOUS | Status: DC | PRN
  Administered 2019-09-01: 150 mg via INTRAVENOUS

## 2019-09-01 MED ORDER — ROCURONIUM BROMIDE 50 MG/5ML IV SOLN
INTRAVENOUS | Status: AC
  Filled 2019-09-01: qty 1

## 2019-09-01 MED ORDER — GLIPIZIDE 5 MG PO TABS
5.0000 mg | ORAL_TABLET | Freq: Every day | ORAL | Status: DC
  Administered 2019-09-02: 09:00:00 5 mg via ORAL
  Filled 2019-09-01 (×2): qty 1

## 2019-09-01 MED ORDER — SUCCINYLCHOLINE CHLORIDE 20 MG/ML IJ SOLN
INTRAMUSCULAR | Status: AC
  Filled 2019-09-01: qty 1

## 2019-09-01 MED ORDER — ISOSORBIDE DINITRATE 30 MG PO TABS
60.0000 mg | ORAL_TABLET | ORAL | Status: DC
  Administered 2019-09-02: 60 mg via ORAL
  Filled 2019-09-01 (×2): qty 2

## 2019-09-01 MED ORDER — BISACODYL 10 MG RE SUPP: 10.0000 mg | Freq: Every day | RECTAL | Status: DC | PRN

## 2019-09-01 MED ORDER — METOCLOPRAMIDE HCL 5 MG/ML IJ SOLN: 5.0000 mg | Freq: Three times a day (TID) | INTRAMUSCULAR | Status: DC | PRN

## 2019-09-01 MED ORDER — ASPIRIN 81 MG PO CHEW
81.0000 mg | CHEWABLE_TABLET | Freq: Every day | ORAL | Status: DC
  Administered 2019-09-02: 09:00:00 81 mg via ORAL
  Filled 2019-09-01: qty 1

## 2019-09-01 MED ORDER — MIDAZOLAM HCL 2 MG/2ML IJ SOLN
INTRAMUSCULAR | Status: AC
  Administered 2019-09-01: 1 mg via INTRAVENOUS
  Filled 2019-09-01: qty 2

## 2019-09-01 MED ORDER — ACETAMINOPHEN 10 MG/ML IV SOLN
INTRAVENOUS | Status: DC | PRN
  Administered 2019-09-01: 1000 mg via INTRAVENOUS

## 2019-09-01 MED ORDER — ONDANSETRON HCL 4 MG/2ML IJ SOLN
4.0000 mg | Freq: Four times a day (QID) | INTRAMUSCULAR | Status: DC | PRN
  Filled 2019-09-01: qty 2

## 2019-09-01 MED ORDER — FENTANYL CITRATE (PF) 100 MCG/2ML IJ SOLN
INTRAMUSCULAR | Status: AC
  Administered 2019-09-01: 07:00:00 50 ug via INTRAVENOUS
  Filled 2019-09-01: qty 2

## 2019-09-01 MED ORDER — FINASTERIDE 5 MG PO TABS
5.0000 mg | ORAL_TABLET | ORAL | Status: DC
  Administered 2019-09-02: 06:00:00 5 mg via ORAL
  Filled 2019-09-01: qty 1

## 2019-09-01 MED ORDER — SUGAMMADEX SODIUM 200 MG/2ML IV SOLN
INTRAVENOUS | Status: DC | PRN
  Administered 2019-09-01: 200 mg via INTRAVENOUS

## 2019-09-01 MED ORDER — SODIUM CHLORIDE FLUSH 0.9 % IV SOLN
INTRAVENOUS | Status: AC
  Filled 2019-09-01: qty 40

## 2019-09-01 MED ORDER — TRAZODONE HCL 100 MG PO TABS
100.0000 mg | ORAL_TABLET | Freq: Every day | ORAL | Status: DC
  Administered 2019-09-01: 22:00:00 100 mg via ORAL
  Filled 2019-09-01: qty 1

## 2019-09-01 MED ORDER — BUPIVACAINE-EPINEPHRINE (PF) 0.5% -1:200000 IJ SOLN
INTRAMUSCULAR | Status: AC
  Filled 2019-09-01: qty 30

## 2019-09-01 MED ORDER — CHLORHEXIDINE GLUCONATE 4 % EX LIQD
60.0000 mL | Freq: Once | CUTANEOUS | Status: AC
  Administered 2019-09-01: 4 via TOPICAL

## 2019-09-01 MED ORDER — EPHEDRINE SULFATE 50 MG/ML IJ SOLN
INTRAMUSCULAR | Status: AC
  Filled 2019-09-01: qty 1

## 2019-09-01 MED ORDER — OXYCODONE HCL 5 MG PO TABS
5.0000 mg | ORAL_TABLET | Freq: Once | ORAL | Status: AC | PRN
  Administered 2019-09-01: 13:00:00 5 mg via ORAL

## 2019-09-01 MED ORDER — LIDOCAINE HCL (PF) 2 % IJ SOLN
INTRAMUSCULAR | Status: AC
  Filled 2019-09-01: qty 5

## 2019-09-01 MED ORDER — DIPHENHYDRAMINE HCL 12.5 MG/5ML PO ELIX: 12.5000 mg | ORAL_SOLUTION | ORAL | Status: DC | PRN

## 2019-09-01 MED ORDER — NITROGLYCERIN 0.4 MG SL SUBL: 0.4000 mg | SUBLINGUAL_TABLET | SUBLINGUAL | Status: DC | PRN

## 2019-09-01 MED ORDER — DEXAMETHASONE SODIUM PHOSPHATE 10 MG/ML IJ SOLN
INTRAMUSCULAR | Status: AC
  Filled 2019-09-01: qty 1

## 2019-09-01 MED ORDER — TRANEXAMIC ACID 1000 MG/10ML IV SOLN
INTRAVENOUS | Status: AC
  Filled 2019-09-01: qty 10

## 2019-09-01 MED ORDER — MIDAZOLAM HCL 2 MG/2ML IJ SOLN: 1.0000 mg | INTRAMUSCULAR | Status: DC | PRN

## 2019-09-01 MED ORDER — LIDOCAINE HCL (PF) 1 % IJ SOLN
INTRAMUSCULAR | Status: AC
  Filled 2019-09-01: qty 5

## 2019-09-01 MED ORDER — HYDROMORPHONE HCL 1 MG/ML IJ SOLN
0.5000 mg | INTRAMUSCULAR | Status: DC | PRN
  Administered 2019-09-02: 04:00:00 1 mg via INTRAVENOUS
  Filled 2019-09-01: qty 1

## 2019-09-01 MED ORDER — FENTANYL CITRATE (PF) 100 MCG/2ML IJ SOLN
50.0000 ug | INTRAMUSCULAR | Status: AC | PRN
  Administered 2019-09-01 (×2): 25 ug via INTRAVENOUS
  Administered 2019-09-01: 08:00:00 50 ug via INTRAVENOUS

## 2019-09-01 MED ORDER — RIFAXIMIN 550 MG PO TABS
550.0000 mg | ORAL_TABLET | Freq: Two times a day (BID) | ORAL | Status: DC
  Administered 2019-09-01 – 2019-09-02 (×2): 550 mg via ORAL
  Filled 2019-09-01 (×2): qty 1

## 2019-09-01 MED ORDER — METOCLOPRAMIDE HCL 10 MG PO TABS: 5.0000 mg | ORAL_TABLET | Freq: Three times a day (TID) | ORAL | Status: DC | PRN

## 2019-09-01 MED ORDER — FAMOTIDINE 20 MG PO TABS: 20.0000 mg | ORAL_TABLET | Freq: Once | ORAL | Status: AC

## 2019-09-01 MED ORDER — SIMETHICONE 80 MG PO CHEW
160.0000 mg | CHEWABLE_TABLET | Freq: Every day | ORAL | Status: DC
  Administered 2019-09-01: 22:00:00 160 mg via ORAL
  Filled 2019-09-01 (×2): qty 2

## 2019-09-01 MED ORDER — INSULIN ASPART 100 UNIT/ML ~~LOC~~ SOLN
0.0000 [IU] | Freq: Three times a day (TID) | SUBCUTANEOUS | Status: DC
  Administered 2019-09-01: 23:00:00 15 [IU] via SUBCUTANEOUS
  Administered 2019-09-02 (×2): 8 [IU] via SUBCUTANEOUS
  Administered 2019-09-02: 5 [IU] via SUBCUTANEOUS
  Filled 2019-09-01 (×4): qty 1

## 2019-09-01 MED ORDER — OXYCODONE HCL 5 MG PO TABS
ORAL_TABLET | ORAL | Status: AC
  Filled 2019-09-01: qty 1

## 2019-09-01 MED ORDER — MAGNESIUM OXIDE 400 (241.3 MG) MG PO TABS
400.0000 mg | ORAL_TABLET | Freq: Every evening | ORAL | Status: DC
  Filled 2019-09-01: qty 1

## 2019-09-01 MED ORDER — TRAMADOL HCL 50 MG PO TABS: 50.0000 mg | ORAL_TABLET | Freq: Four times a day (QID) | ORAL | Status: DC | PRN

## 2019-09-01 MED ORDER — ENOXAPARIN SODIUM 40 MG/0.4ML ~~LOC~~ SOLN
40.0000 mg | SUBCUTANEOUS | Status: DC
  Administered 2019-09-02: 09:00:00 40 mg via SUBCUTANEOUS
  Filled 2019-09-01: qty 0.4

## 2019-09-01 MED ORDER — PROPOFOL 500 MG/50ML IV EMUL
INTRAVENOUS | Status: AC
  Filled 2019-09-01: qty 50

## 2019-09-01 MED ORDER — CLINDAMYCIN PHOSPHATE 900 MG/50ML IV SOLN
900.0000 mg | INTRAVENOUS | Status: AC
  Administered 2019-09-01: 900 mg via INTRAVENOUS

## 2019-09-01 MED ORDER — ACETAMINOPHEN 10 MG/ML IV SOLN
INTRAVENOUS | Status: AC
  Filled 2019-09-01: qty 100

## 2019-09-01 MED ORDER — BUPIVACAINE LIPOSOME 1.3 % IJ SUSP
INTRAMUSCULAR | Status: DC | PRN
  Administered 2019-09-01: 7 mL via PERINEURAL
  Administered 2019-09-01: 13 mL via PERINEURAL

## 2019-09-01 MED ORDER — ACETAMINOPHEN 325 MG PO TABS: 325.0000 mg | ORAL_TABLET | Freq: Four times a day (QID) | ORAL | Status: DC | PRN

## 2019-09-01 MED ORDER — PHENYLEPHRINE HCL (PRESSORS) 10 MG/ML IV SOLN
INTRAVENOUS | Status: DC | PRN
  Administered 2019-09-01 (×2): 100 ug via INTRAVENOUS
  Administered 2019-09-01: 200 ug via INTRAVENOUS

## 2019-09-01 MED ORDER — TRANEXAMIC ACID 1000 MG/10ML IV SOLN: INTRAVENOUS | Status: DC | PRN

## 2019-09-01 MED ORDER — GABAPENTIN 300 MG PO CAPS
300.0000 mg | ORAL_CAPSULE | Freq: Every day | ORAL | Status: DC
  Administered 2019-09-01: 300 mg via ORAL
  Filled 2019-09-01: qty 1

## 2019-09-01 MED ORDER — CLINDAMYCIN PHOSPHATE 900 MG/50ML IV SOLN
INTRAVENOUS | Status: AC
  Filled 2019-09-01: qty 50

## 2019-09-01 MED ORDER — SENNOSIDES-DOCUSATE SODIUM 8.6-50 MG PO TABS: 1.0000 | ORAL_TABLET | Freq: Every evening | ORAL | Status: DC | PRN

## 2019-09-01 MED ORDER — OXYCODONE HCL 5 MG/5ML PO SOLN: 5.0000 mg | Freq: Once | ORAL | Status: AC | PRN

## 2019-09-01 SURGICAL SUPPLY — 75 items
APL PRP STRL LF DISP 70% ISPRP (MISCELLANEOUS) ×1
BASEPLATE GLENOSPHERE 25 (Plate) ×1 IMPLANT
BASEPLATE GLENOSPHERE 25MM (Plate) ×1 IMPLANT
BEARING HUMERAL +3 40D (Joint) ×2 IMPLANT
BIT DRILL TWIST 2.7 (BIT) ×1 IMPLANT
BIT DRILL TWIST 2.7MM (BIT) ×1
BLADE SAW SAG 25X90X1.19 (BLADE) ×3 IMPLANT
BRNG HUM +3 40 RVRS SHLDR (Joint) ×1 IMPLANT
CANISTER SUCT 1200ML W/VALVE (MISCELLANEOUS) ×3 IMPLANT
CANISTER SUCT 3000ML PPV (MISCELLANEOUS) ×6 IMPLANT
CHLORAPREP W/TINT 26 (MISCELLANEOUS) ×3 IMPLANT
COOLER POLAR GLACIER W/PUMP (MISCELLANEOUS) ×3 IMPLANT
COVER BACK TABLE REUSABLE LG (DRAPES) ×3 IMPLANT
COVER WAND RF STERILE (DRAPES) ×3 IMPLANT
CRADLE LAMINECT ARM (MISCELLANEOUS) ×3 IMPLANT
DIAL VERSA SHOULDER 40 STD (Joint) ×2 IMPLANT
DRAPE 3/4 80X56 (DRAPES) ×6 IMPLANT
DRAPE IMP U-DRAPE 54X76 (DRAPES) ×6 IMPLANT
DRAPE INCISE IOBAN 66X45 STRL (DRAPES) ×6 IMPLANT
DRSG OPSITE POSTOP 4X10 (GAUZE/BANDAGES/DRESSINGS) ×2 IMPLANT
DRSG OPSITE POSTOP 4X8 (GAUZE/BANDAGES/DRESSINGS) ×3 IMPLANT
ELECT BLADE 6.5 EXT (BLADE) IMPLANT
ELECT CAUTERY BLADE 6.4 (BLADE) ×3 IMPLANT
FILTER SMOKE EVAC ULPA (FILTER) ×2 IMPLANT
GLOVE BIO SURGEON STRL SZ7.5 (GLOVE) ×12 IMPLANT
GLOVE BIO SURGEON STRL SZ8 (GLOVE) ×12 IMPLANT
GLOVE BIOGEL PI IND STRL 8 (GLOVE) ×1 IMPLANT
GLOVE BIOGEL PI INDICATOR 8 (GLOVE) ×2
GLOVE INDICATOR 8.0 STRL GRN (GLOVE) ×3 IMPLANT
GOWN STRL REUS W/ TWL LRG LVL3 (GOWN DISPOSABLE) ×1 IMPLANT
GOWN STRL REUS W/ TWL XL LVL3 (GOWN DISPOSABLE) ×1 IMPLANT
GOWN STRL REUS W/TWL LRG LVL3 (GOWN DISPOSABLE) ×3
GOWN STRL REUS W/TWL XL LVL3 (GOWN DISPOSABLE) ×3
HOOD PEEL AWAY FLYTE STAYCOOL (MISCELLANEOUS) ×9 IMPLANT
ILLUMINATOR WAVEGUIDE N/F (MISCELLANEOUS) ×3 IMPLANT
IV NS IRRIG 3000ML ARTHROMATIC (IV SOLUTION) ×2 IMPLANT
KIT STABILIZATION SHOULDER (MISCELLANEOUS) ×3 IMPLANT
KIT TURNOVER KIT A (KITS) ×3 IMPLANT
MASK FACE SPIDER DISP (MASK) ×3 IMPLANT
MAT ABSORB  FLUID 56X50 GRAY (MISCELLANEOUS) ×2
MAT ABSORB FLUID 56X50 GRAY (MISCELLANEOUS) ×1 IMPLANT
NDL SAFETY ECLIPSE 18X1.5 (NEEDLE) ×1 IMPLANT
NDL SPNL 20GX3.5 QUINCKE YW (NEEDLE) ×1 IMPLANT
NEEDLE HYPO 18GX1.5 SHARP (NEEDLE) ×3
NEEDLE HYPO 22GX1.5 SAFETY (NEEDLE) ×3 IMPLANT
NEEDLE SPNL 20GX3.5 QUINCKE YW (NEEDLE) ×3 IMPLANT
NS IRRIG 500ML POUR BTL (IV SOLUTION) ×3 IMPLANT
PACK ARTHROSCOPY SHOULDER (MISCELLANEOUS) ×3 IMPLANT
PAD WRAPON POLAR SHDR UNIV (MISCELLANEOUS) ×1 IMPLANT
PENCIL SMOKE EVACUATOR (MISCELLANEOUS) ×3 IMPLANT
PIN THREADED REVERSE (PIN) ×2 IMPLANT
PULSAVAC PLUS IRRIG FAN TIP (DISPOSABLE) ×3
SCREW BONE LOCKING 4.75X35X3.5 (Screw) ×2 IMPLANT
SCREW BONE STRL 6.5MMX45MM (Screw) ×2 IMPLANT
SCREW LOCKING 4.75MMX15MM (Screw) ×2 IMPLANT
SCREW LOCKING STRL 4.75X25X3.5 (Screw) ×4 IMPLANT
SLING ULTRA II M (MISCELLANEOUS) ×3 IMPLANT
SOL .9 NS 3000ML IRR  AL (IV SOLUTION) ×2
SOL .9 NS 3000ML IRR AL (IV SOLUTION) ×1
SOL .9 NS 3000ML IRR UROMATIC (IV SOLUTION) ×1 IMPLANT
SPONGE LAP 18X18 RF (DISPOSABLE) ×3 IMPLANT
STAPLER SKIN PROX 35W (STAPLE) ×3 IMPLANT
STEM HUMERAL STRL 15MMX140MM (Stem) ×2 IMPLANT
SUT ETHIBOND 0 MO6 C/R (SUTURE) ×3 IMPLANT
SUT FIBERWIRE #2 38 BLUE 1/2 (SUTURE) ×12
SUT VIC AB 0 CT1 36 (SUTURE) ×3 IMPLANT
SUT VIC AB 2-0 CT1 27 (SUTURE) ×6
SUT VIC AB 2-0 CT1 TAPERPNT 27 (SUTURE) ×2 IMPLANT
SUTURE FIBERWR #2 38 BLUE 1/2 (SUTURE) ×4 IMPLANT
SYR 10ML LL (SYRINGE) ×3 IMPLANT
SYR 30ML LL (SYRINGE) IMPLANT
TIP FAN IRRIG PULSAVAC PLUS (DISPOSABLE) ×1 IMPLANT
TRAY HUM MINI SHOULDER +0 40D (Shoulder) ×2 IMPLANT
WATER STERILE IRR 1000ML POUR (IV SOLUTION) ×2 IMPLANT
WRAPON POLAR PAD SHDR UNIV (MISCELLANEOUS) ×3

## 2019-09-01 NOTE — Transfer of Care (Signed)
Immediate Anesthesia Transfer of Care Note  Patient: Dixie Dials  Procedure(s) Performed: REVERSE SHOULDER ARTHROPLASTY (Left Shoulder)  Patient Location: PACU  Anesthesia Type:General  Level of Consciousness: sedated  Airway & Oxygen Therapy: Patient Spontanous Breathing and Patient connected to face mask oxygen  Post-op Assessment: Report given to RN and Post -op Vital signs reviewed and stable  Post vital signs: Reviewed and stable  Last Vitals:  Vitals Value Taken Time  BP 131/61 09/01/19 1026  Temp    Pulse 71 09/01/19 1028  Resp 19 09/01/19 1028  SpO2 95 % 09/01/19 1028  Vitals shown include unvalidated device data.  Last Pain:  Vitals:   09/01/19 0623  TempSrc: Tympanic  PainSc: 3          Complications: No apparent anesthesia complications

## 2019-09-01 NOTE — Evaluation (Signed)
Physical Therapy Evaluation Patient Details Name: Jerry [name] MRN: [id number] DOB: [birth year] Today's Date: 09/01/2019   History of Present Illness  Jerry [name] is a 10yoM who comes to [facility] on 2/25 for elective rTSA on Left. PTA pt reports no limitations with transfers, AMB, no DME use.  Clinical Impression  Pt admitted with above diagnosis. Pt currently with functional limitations due to the deficits listed below (see "PT Problem List"). Upon entry, pt in bed, awake and agreeable to participate. Wife at bedside. The pt is alert and oriented x4, pleasant, conversational, funny, and generally a good historian. Pt motivated to perform all mobility independently. Bed mobility and transfers require some additional effort and problem solving, but no physical assistance. Pt able to AMB about the unit at his baseline level, denies any acute abnormality, weakness, dizziness, or unsteadiness. Patient is near baseline, all education completed, and time is given to address all questions/concerns. No additional skilled PT services needed at this time, PT signing off. PT recommends daily ambulation ad lib or with nursing staff as needed to prevent deconditioning. Wife educated on ploar care instruction. OT to provide additional detail on sling use, polar care, precautions, ADL performance, et al.       Follow Up Recommendations Follow surgeon's recommendation for DC plan and follow-up therapies;Supervision - Intermittent    Equipment Recommendations  None recommended by PT    Recommendations for Other Services       Precautions / Restrictions Precautions Precautions: Fall Restrictions Weight Bearing Restrictions: Yes LUE Weight Bearing: Non weight bearing      Mobility  Bed Mobility Overal bed mobility: Modified Independent                Transfers Overall transfer level: Needs assistance Equipment used: None Transfers: Sit to/from Stand Sit to Stand: Supervision          General transfer comment: heavy effort, dynamic thrust technique, no LOB, no UE use  Ambulation/Gait Ambulation/Gait assistance: Supervision Gait Distance (Feet): 200 Feet Assistive device: None       General Gait Details: Reports to be moving at baseline  Stairs            Wheelchair Mobility    Modified Rankin (Stroke Patients Only)       Balance Overall balance assessment: Independent;No apparent balance deficits (not formally assessed)                                           Pertinent Vitals/Pain Pain Assessment: 0-10 Pain Score: 4  Pain Location: Lt shoulder Pain Descriptors / Indicators: Aching Pain Intervention(s): Limited activity within patient's tolerance;Monitored during session;Premedicated before session    Home Living Family/patient expects to be discharged to:: Private residence Living Arrangements: Alone(Wife will be staying with patient at DC.) Available Help at Discharge: Family Type of Home: Mobile home Home Access: Stairs to enter Entrance Stairs-Rails: Psychiatric nurse of Steps: 2 Home Layout: One level Home Equipment: None      Prior Function Level of Independence: Independent               Hand Dominance   Dominant Hand: Right    Extremity/Trunk Assessment                Communication   Communication: No difficulties  Cognition Arousal/Alertness: Awake/alert Behavior During Therapy: WFL for tasks assessed/performed Overall Cognitive Status: Within Functional  Limits for tasks assessed                                        General Comments      Exercises     Assessment/Plan    PT Assessment All further PT needs can be met in the next venue of care;Patent does not need any further PT services  PT Problem List Decreased strength;Decreased range of motion;Decreased mobility;Decreased knowledge of use of DME;Decreased safety awareness;Decreased knowledge of  precautions       PT Treatment Interventions Functional mobility training;Therapeutic activities;Patient/family education    PT Goals (Current goals can be found in the Care Plan section)  Acute Rehab PT Goals PT Goal Formulation: All assessment and education complete, DC therapy Time For Goal Achievement: 09/15/19    Frequency     Barriers to discharge        Co-evaluation               AM-PAC PT "6 Clicks" Mobility  Outcome Measure Help needed turning from your back to your side while in a flat bed without using bedrails?: None Help needed moving from lying on your back to sitting on the side of a flat bed without using bedrails?: A Little Help needed moving to and from a bed to a chair (including a wheelchair)?: A Little Help needed standing up from a chair using your arms (e.g., wheelchair or bedside chair)?: A Little Help needed to walk in hospital room?: None Help needed climbing 3-5 steps with a railing? : A Little 6 Click Score: 20    End of Session   Activity Tolerance: Patient tolerated treatment well;No increased pain Patient left: in chair;with family/visitor present;with call bell/phone within reach;Other (comment)(Polar Care activated) Nurse Communication: Mobility status PT Visit Diagnosis: Other symptoms and signs involving the nervous system (R29.898)    Time: 9806-9996 PT Time Calculation (min) (ACUTE ONLY): 25 min   Charges:   PT Evaluation $PT Eval Low Complexity: 1 Low PT Treatments $Gait Training: 8-22 mins        3:53 PM, 09/01/19 Etta Grandchild, PT, DPT Physical Therapist - [facility]  [phone] ([place])    [place] 09/01/2019, 3:50 PM

## 2019-09-01 NOTE — OR Nursing (Signed)
2 screws were explanted from the left shoulder. Placed in biohazard bin for disposal.

## 2019-09-01 NOTE — Op Note (Signed)
09/01/2019  10:13 AM  Patient:   Jerry [name]  Pre-Op Diagnosis:   Massive irreparable rotator cuff tear with cuff arthropathy, left shoulder.  Post-Op Diagnosis:   Same  Procedure:   Reverse left total shoulder arthroplasty.  Surgeon:   Pascal Lux, MD  Assistant:   Cameron Proud, PA-C  Anesthesia:   General endotracheal with an interscalene block using Exparel placed preoperatively by the anesthesiologist.  Findings:   As above.  Complications:   None  EBL:    200 cc  Fluids:   1100 cc crystalloid  UOP:   None  TT:   None  Drains:   None  Closure:   Staples  Implants:   All press-fit Biomet Comprehensive system with a #15 micro-humeral stem, a 40 mm humeral tray with a +3 mm insert, and a mini-base plate with a 40 mm standard offset glenosphere.  Brief Clinical Note:   The patient is a 69 year old male with progressively worsening left shoulder pain. His symptoms have progressed despite medications, activity modification, etc. His history and examination are consistent with a massive irreparable recurrent rotator cuff tear. His past history is notable for having undergone at least one attempted rotator cuff repair, as well as an open reduction and internal fixation of a glenoid fracture. The patient presents at this time for a reverse left total shoulder arthroplasty.  Procedure:   The patient underwent placement of an interscalene block using Exparel by the anesthesiologist in the preoperative holding area before being brought into the operating room and lain in the supine position. The patient then underwent general endotracheal intubation and anesthesia before the patient was repositioned in the beach chair position using the beach chair positioner. The left shoulder and upper extremity were prepped with ChloraPrep solution before being draped sterilely. Preoperative antibiotics were administered.   Utilizing the patient's prior anterior surgical incision, a  standard anterior approach to the shoulder was made through an approximately 5-6 inch incision. The incision was carried down through the subcutaneous tissues to expose the deltopectoral fascia. The interval between the deltoid and pectoralis muscles was identified and this plane developed, retracting the cephalic vein laterally with the deltoid muscle. The conjoined tendon was identified. Its lateral margin was dissected and the Kolbel self-retraining retractor inserted. The "three sisters" had been taken down previously. Bursal tissues were removed to improve visualization. The subscapularis tendon was released from its attachment to the lesser tuberosity 1 cm proximal to its insertion and several tagging sutures placed. The inferior capsule was released with care after identifying and protecting the axillary nerve. The proximal humeral cut was made at approximately 25 of retroversion using the extra-medullary guide.   Attention was redirected to the glenoid. The labrum was debrided circumferentially before the heads of the two retained screws were identified.  Each was removed using the appropriate screwdriver. The center of the glenoid was marked with electrocautery. The guidewire was drilled into the glenoid neck using the appropriate guide. After verifying its position, it was overreamed with the mini-baseplate reamer to create a flat surface. The permanent mini-baseplate was impacted into place. It was stabilized with a 45 x 6.5 mm central screw and four peripheral locking screws. The permanent 40 mm standard offset glenosphere was then impacted into place and its Morse taper locking mechanism verified using manual distraction.  Attention was directed to the humeral side. The humeral canal was reamed sequentially beginning with the end-cutting reamer then progressing from a 4 mm reamer up to a  15 mm reamer. This provided excellent circumferential chatter. The canal was broached beginning with a #11  broach and progressing to a #15 broach. This was left in place and a trial reduction performed using the standard humeral platform and the +0 mm and +3 mm inserts. Utilizing the +3 mm trial insert the arm demonstrated excellent range of motion as the hand could be brought across the chest to the opposite shoulder and brought to the top of the patient's head and to the patient's ear. The shoulder appeared stable throughout this range of motion. The joint was dislocated and the trial components removed.   The proximal humerus was irrigated thoroughly with sterile saline solution using the jet lavage system before the permanent #15 micro-stem was impacted into place with care taken to maintain the appropriate version. The permanent 40 mm humeral platform with the +3 mm insert was put together on the back table and impacted into place. Again, the [facility] taper locking mechanism was verified using manual distraction. The shoulder was relocated using two finger pressure and again placed through a range of motion with the findings as described above.  The wound was copiously irrigated with sterile saline solution using the jet lavage system before a total of 30 cc of 0.5% Sensorcaine with epinephrine was injected into the pericapsular and peri-incisional tissues to help with postoperative analgesia. The subscapularis tendon was reapproximated using #2 FiberWire interrupted sutures. The deltopectoral interval was closed using #0 Vicryl interrupted sutures before the subcutaneous tissues were closed using 2-0 Vicryl interrupted sutures. The skin was closed using staples. Prior to closing the skin, 1 g of transexemic acid in 10 cc of normal saline was injected intra-articularly to help with postoperative bleeding. A sterile occlusive dressing was applied to the wound before the arm was placed into a shoulder immobilizer with an abduction pillow. A Polar Care system also was applied to the shoulder. The patient was then  transferred back to a hospital bed before being awakened, extubated, and returned to the recovery room in satisfactory condition after tolerating the procedure well.

## 2019-09-01 NOTE — Progress Notes (Signed)
Ch visited with Pt in response to Consult for AD. Upon arrival, Pt was on the phone with his ex-wife. Pt reported that this was his fifth surgery on his shoulder after [place]. Pt reported that [place] 6 foot [place] long ago, and chipped a bone in his shoulder. 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 Pt expressed wish to complete Medical Power of Attorney document with his ex-wife, as he does not have any children, nor other family. Pt reported that he lives by himself, and his ex-wife will be taking care of him for a week after tomorrow's discharge before he will have to take care of himself again with limited use of his left arm. Ch explained AD to Pt and said that she will let Ferne Coe, and Ch Waters know about finding witnesses to complete AD tomorrow (Friday) afternoon when Pt's ex-wife arrives. Ch spoke with Pt for a while, let Pt know about chaplain availability, and left. Pt expressed wishes to have Ch again for a visit.    09/01/19 1726  Clinical Encounter Type  Visited With Patient  Visit Type Initial;Psychological support;Spiritual support;Social support  Referral From Patient  Consult/Referral To Chaplain (To complete Medical Power of Attorney)  Spiritual Encounters  Spiritual Needs Emotional  Stress Factors  Patient Stress Factors Health changes;Lack of caregivers;Loss of control;Major life changes  Family Stress Factors Not reviewed

## 2019-09-01 NOTE — Progress Notes (Signed)
FS Blood sugar 414. Dr. Roland Rack notified. Will use TID sliding scale coverage. Pt. aware

## 2019-09-01 NOTE — Anesthesia Preprocedure Evaluation (Signed)
Anesthesia Evaluation  Patient identified by MRN, date of birth, ID band Patient awake    Reviewed: Allergy & Precautions, H&P , NPO status , Patient's Chart, lab work & pertinent test results  History of Anesthesia Complications Negative for: history of anesthetic complications  Airway Mallampati: II  TM Distance: >3 FB Neck ROM: limited    Dental  (+) Poor Dentition, Missing, Edentulous Lower, Edentulous Upper   Pulmonary neg pulmonary ROS, neg shortness of breath,           Cardiovascular Exercise Tolerance: Good hypertension, (-) angina+ CAD and + Cardiac Stents  (-) Past MI      Neuro/Psych  Headaches, PSYCHIATRIC DISORDERS    GI/Hepatic Neg liver ROS, hiatal hernia, GERD  Medicated and Controlled,  Endo/Other  diabetes, Type 2  Renal/GU      Musculoskeletal  (+) Arthritis ,   Abdominal   Peds  Hematology negative hematology ROS (+)   Anesthesia Other Findings Past Medical History: No date: Arthritis No date: Coronary artery disease No date: Diabetes mellitus without complication (HCC) No date: GERD (gastroesophageal reflux disease)     Comment:  food related only with spicy only No date: Headache     Comment:  h/o as a child-migraines No date: History of hiatal hernia No date: Hypercholesteremia No date: Hypertension No date: Liver lesion No date: Primary cancer of kidney or ureter     Comment:  KIDNEY CANCER No date: PTSD (post-traumatic stress disorder) No date: Thrombosis     Comment:  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  Past Surgical History: No date: APPENDECTOMY     Comment:  age 47 No date: CARPAL TUNNEL RELEASE; Right 03/23/2017: CORONARY STENT INTERVENTION     Comment:  STENT SYNERGY DES 2.75X28 drug eluting stent was               successfully placed 03/23/2017: CORONARY STENT INTERVENTION; N/A     Comment:  Procedure: CORONARY STENT INTERVENTION;  Surgeon:          Jettie Booze, MD;  Location: [facility];               Service: Cardiovascular;  Laterality: N/A; No date: HAND SURGERY 03/23/2017: LEFT HEART CATH AND CORONARY ANGIOGRAPHY; N/A     Comment:  Procedure: LEFT HEART CATH AND CORONARY ANGIOGRAPHY;                Surgeon: Jettie Booze, MD;  Location: [facility]              [facility];  Service: Cardiovascular;  Laterality: N/A; No date: PARTIAL NEPHRECTOMY; Left No date: PILONIDAL CYST EXCISION No date: SHOULDER SURGERY; Left     Comment:  x4 No date: STENT PLACEMENT VASCULAR ([facility] HX)     Reproductive/Obstetrics negative OB ROS                             Anesthesia Physical Anesthesia Plan  ASA: III  Anesthesia Plan: General ETT   Post-op Pain Management: GA combined w/ Regional for post-op pain   Induction: Intravenous  PONV Risk Score and Plan: Dexamethasone, Ondansetron, Midazolam and Treatment may vary due to age or medical condition  Airway Management Planned: Oral ETT  Additional Equipment:   Intra-op Plan:   Post-operative Plan: Extubation in OR  Informed Consent: I have reviewed the patients History and Physical, chart, labs and discussed the procedure including  the risks, benefits and alternatives for the proposed anesthesia with the patient or authorized representative who has indicated his/her understanding and acceptance.     Dental Advisory Given  Plan Discussed with: Anesthesiologist, CRNA and Surgeon  Anesthesia Plan Comments: (Patient consented for risks of anesthesia including but not limited to:  - adverse reactions to medications - damage to teeth, lips or other oral mucosa - sore throat or hoarseness - Damage to heart, brain, lungs or loss of life  Patient voiced understanding.)        Anesthesia Quick Evaluation

## 2019-09-01 NOTE — Anesthesia Procedure Notes (Signed)
Procedure Name: Intubation Date/Time: 09/01/2019 7:41 AM Performed by: Justus Memory, CRNA Pre-anesthesia Checklist: Patient identified, Patient being monitored, Timeout performed, Emergency Drugs available and Suction available Patient Re-evaluated:Patient Re-evaluated prior to induction Oxygen Delivery Method: Circle system utilized Preoxygenation: Pre-oxygenation with 100% oxygen Induction Type: IV induction Ventilation: Mask ventilation without difficulty Laryngoscope Size: 3 and McGraph Grade View: Grade I Tube type: Oral Tube size: 7.0 mm Number of attempts: 1 Airway Equipment and Method: Stylet and Video-laryngoscopy Placement Confirmation: ETT inserted through vocal cords under direct vision,  positive ETCO2 and breath sounds checked- equal and bilateral Secured at: 21 cm Tube secured with: Tape Dental Injury: Teeth and Oropharynx as per pre-operative assessment  Difficulty Due To: Difficult Airway- due to large tongue and Difficult Airway- due to anterior larynx Future Recommendations: Recommend- induction with short-acting agent, and alternative techniques readily available

## 2019-09-01 NOTE — H&P (Signed)
Paper H&P to be scanned into permanent record. H&P reviewed and patient re-examined. No changes. 

## 2019-09-01 NOTE — Anesthesia Procedure Notes (Signed)
Anesthesia Regional Block: Interscalene brachial plexus block   Pre-Anesthetic Checklist: ,, timeout performed, Correct Patient, Correct Site, Correct Laterality, Correct Procedure, Correct Position, site marked, Risks and benefits discussed,  Surgical consent,  Pre-op evaluation,  At surgeon's request and post-op pain management  Laterality: Upper and Left  Prep: chloraprep       Needles:  Injection technique: Single-shot  Needle Type: Stimiplex     Needle Length: 5cm  Needle Gauge: 22     Additional Needles:   Procedures:,,,, ultrasound used (permanent image in chart),,,,  Narrative:  Start time: 09/01/2019 7:15 AM End time: 09/01/2019 7:19 AM Injection made incrementally with aspirations every 5 mL.  Performed by: Personally  Anesthesiologist: Abigail Marsiglia, Precious Haws, MD  Additional Notes: Patient consented for risk and benefits of nerve block including but not limited to nerve damage, failed block, bleeding and infection.  Patient voiced understanding.  Functioning IV was confirmed and monitors were applied.  A 35m 22ga Stimuplex needle was used. Sterile prep,hand hygiene and sterile gloves were used.  Minimal sedation used for procedure.  No paresthesia endorsed by patient during the procedure.  Negative aspiration and negative test dose prior to incremental administration of local anesthetic. The patient tolerated the procedure well with no immediate complications.

## 2019-09-01 NOTE — Progress Notes (Signed)
Pts BG 475, MD Poggi notified. Orders received for Moderate SSI Novolog. Order placed. Per MD give pt 15 units Novolog.

## 2019-09-01 NOTE — Anesthesia Postprocedure Evaluation (Signed)
Anesthesia Post Note  Patient: Jerry [name]  Procedure(s) Performed: REVERSE SHOULDER ARTHROPLASTY (Left Shoulder)  Patient location during evaluation: PACU Anesthesia Type: General Level of consciousness: awake and alert Pain management: pain level controlled Vital Signs Assessment: post-procedure vital signs reviewed and stable Respiratory status: spontaneous breathing, nonlabored ventilation, respiratory function stable and patient connected to nasal cannula oxygen Cardiovascular status: blood pressure returned to baseline and stable Postop Assessment: no apparent nausea or vomiting Anesthetic complications: no     Last Vitals:  Vitals:   09/01/19 1255 09/01/19 1315  BP: 117/62   Pulse: 64 61  Resp: 20 16  Temp:    SpO2: 97% 96%    Last Pain:  Vitals:   09/01/19 1255  TempSrc:   PainSc: 3                  Precious Haws Billie Trager

## 2019-09-02 ENCOUNTER — Encounter: Payer: Self-pay | Admitting: Surgery

## 2019-09-02 LAB — BASIC METABOLIC PANEL
Anion gap: 5 (ref 5–15)
BUN: 22 mg/dL (ref 8–23)
CO2: 24 mmol/L (ref 22–32)
Calcium: 8.2 mg/dL — ABNORMAL LOW (ref 8.9–10.3)
Chloride: 106 mmol/L (ref 98–111)
Creatinine, Ser: 0.93 mg/dL (ref 0.61–1.24)
GFR calc Af Amer: >60 mL/min (ref >60)
GFR calc non Af Amer: >60 mL/min (ref >60)
Glucose, Bld: 251 mg/dL — ABNORMAL HIGH (ref 70–99)
Potassium: 4.6 mmol/L (ref 3.5–5.1)
Sodium: 135 mmol/L (ref 135–145)

## 2019-09-02 LAB — GLUCOSE, CAPILLARY
Glucose-Capillary: 232 mg/dL — ABNORMAL HIGH (ref 70–99)
Glucose-Capillary: 245 mg/dL — ABNORMAL HIGH (ref 70–99)
Glucose-Capillary: 271 mg/dL — ABNORMAL HIGH (ref 70–99)
Glucose-Capillary: 290 mg/dL — ABNORMAL HIGH (ref 70–99)
Glucose-Capillary: 334 mg/dL — ABNORMAL HIGH (ref 70–99)

## 2019-09-02 MED ORDER — OXYCODONE HCL 5 MG PO TABS: 5.0000 mg | ORAL_TABLET | ORAL | 0 refills | Status: DC | PRN

## 2019-09-02 MED ORDER — ONDANSETRON HCL 4 MG PO TABS: 4.0000 mg | ORAL_TABLET | Freq: Four times a day (QID) | ORAL | 1 refills | Status: DC | PRN

## 2019-09-02 MED ORDER — ASPIRIN EC 325 MG PO TBEC: 325.0000 mg | DELAYED_RELEASE_TABLET | Freq: Every day | ORAL | 0 refills | Status: DC

## 2019-09-02 NOTE — Discharge Summary (Signed)
Physician Discharge Summary  Patient ID: Jerry [name] MRN: [id number] DOB/AGE: 1951-01-08 68 y.o.  Admit date: 09/01/2019 Discharge date: 09/02/2019  Admission Diagnoses:  Status post reverse arthroplasty of shoulder, left [Z96.612]  Discharge Diagnoses: Patient Active Problem List   Diagnosis Date Noted  . Status post reverse arthroplasty of shoulder, 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  . Chest pain due to CAD ([place]) 03/27/2017  . Upper GI bleed   . S/P coronary artery stent placement   . Coronary artery disease involving native coronary artery of native heart with angina pectoris ([place])   . Chest pain 03/19/2017  . Other cirrhosis of liver ([place]) 03/19/2017  . Portal vein thrombosis 01/21/2016  . Type 2 diabetes mellitus without complication, without long-term current use of insulin ([place]) 01/21/2016  . Coronary artery disease due to lipid rich plaque 01/21/2016  . Essential hypertension     Past Medical History:  Diagnosis Date  . Arthritis   . Coronary artery disease   . Diabetes mellitus without complication ([place])   . GERD (gastroesophageal reflux disease)    food related only with spicy only  . Headache    h/o as a child-migraines  . History of hiatal hernia   . Hypercholesteremia   . Hypertension   . Liver lesion   . Primary cancer of kidney or ureter    KIDNEY CANCER  . PTSD (post-traumatic stress disorder)   . 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   Consultants (if any):   Discharged Condition: Improved  Hospital Course: Jerry [name] is an 69 y.o. male who was admitted 09/01/2019 with a diagnosis of a massive irreparable rotator cuff tear with cuff arthropathy of the left shoulder and went to the operating room on 09/01/2019 and underwent the above named procedures.    Surgeries: Procedure(s): REVERSE SHOULDER ARTHROPLASTY on 09/01/2019 Patient tolerated the surgery well. Taken to PACU where she was stabilized and  then transferred to the orthopedic floor.  Started on Lovenox 53m q 24 hrs. Foot pumps applied bilaterally at 80 mm. Heels elevated on bed with rolled towels. No evidence of DVT. Negative [place]. Physical therapy started on day #1 for gait training and transfer. OT started day #1 for ADL and assisted devices.  Patient's IV was removed on POD1  Implants: All press-fit Biomet Comprehensive system with a #15 micro-humeral stem, a 40 mm humeral tray with a +3 mm insert, and a mini-base plate with a 40 mm standard offset glenosphere.  He was given perioperative antibiotics:  Anti-infectives (From admission, onward)   Start     Dose/Rate Route Frequency Ordered Stop   09/01/19 2200  rifaximin (XIFAXAN) tablet 550 mg     550 mg Oral 2 times daily 09/01/19 1448     09/01/19 1500  clindamycin (CLEOCIN) IVPB 600 mg     600 mg 100 mL/hr over 30 Minutes Intravenous Every 6 hours 09/01/19 1448 09/02/19 0332   09/01/19 0622  clindamycin (CLEOCIN) 900 MG/50ML IVPB    Note to Pharmacy: KLyman Bishop  : cabinet override      09/01/19 0622 09/01/19 0755   09/01/19 0615  clindamycin (CLEOCIN) IVPB 900 mg     900 mg 100 mL/hr over 30 Minutes Intravenous On call to O.R. 09/01/19 0456202/25/21 0758    .  He was given sequential compression devices, early ambulation, and Lovenox for DVT prophylaxis.  He benefited maximally from the hospital stay and there  were no complications.    Recent vital signs:  Vitals:   09/02/19 0339 09/02/19 0430  BP: 117/60   Pulse: (!) 49 (!) 51  Resp: 16   Temp: (!) 97.5 F (36.4 C)   SpO2: 98%     Recent laboratory studies:  Lab Results  Component Value Date   HGB 13.5 08/30/2019   HGB 11.9 (L) 03/28/2017   HGB 10.6 (L) 03/28/2017   Lab Results  Component Value Date   WBC 5.9 08/30/2019   PLT 141 (L) 08/30/2019   Lab Results  Component Value Date   INR 1.61 03/27/2017   Lab Results  Component Value Date   NA 135 09/02/2019   K 4.6 09/02/2019    CL 106 09/02/2019   CO2 24 09/02/2019   BUN 22 09/02/2019   CREATININE 0.93 09/02/2019   GLUCOSE 251 (H) 09/02/2019    Discharge Medications:   Allergies as of 09/02/2019      Reactions   Atorvastatin Other (See Comments)   Unknown   Metoprolol Other (See Comments)   bradycardia   Penicillins Swelling   Swelling at injection site Has patient had a PCN reaction causing immediate rash, facial/tongue/throat swelling, SOB or lightheadedness with hypotension: No Has patient had a PCN reaction causing severe rash involving mucus membranes or skin necrosis: No Has patient had a PCN reaction that required hospitalization No Has patient had a PCN reaction occurring within the last 10 years: No If all of the above answers are "NO", then may proceed with Cephalosporin use.   Rosuvastatin Other (See Comments)   unknown   Simvastatin Other (See Comments)   unknown   Terazosin Other (See Comments)   Orthostatic hypotension      Medication List    STOP taking these medications   aspirin 81 MG chewable tablet Replaced by: aspirin EC 325 MG tablet     TAKE these medications   acetaminophen 325 MG tablet Commonly known as: TYLENOL Take 975 mg by mouth 2 (two) times daily.   aspirin EC 325 MG tablet Take 1 tablet (325 mg total) by mouth daily. Replaces: aspirin 81 MG chewable tablet   docusate sodium 50 MG capsule Commonly known as: COLACE Take 50 mg by mouth at bedtime.   finasteride 5 MG tablet Commonly known as: PROSCAR Take 5 mg by mouth every morning.   gabapentin 300 MG capsule Commonly known as: NEURONTIN Take 300 mg by mouth at bedtime.   glipiZIDE 5 MG tablet Commonly known as: GLUCOTROL Take 1 tablet (5 mg total) by mouth 2 (two) times daily before a meal. What changed:   when to take this  additional instructions   isosorbide dinitrate 30 MG tablet Commonly known as: ISORDIL 90 mg 1000 daily What changed:   how much to take  how to take this  when to  take this  additional instructions   isosorbide dinitrate 30 MG tablet Commonly known as: ISORDIL 30 mg at 1800 in the evening What changed: Another medication with the same name was changed. Make sure you understand how and when to take each.   lisinopril 5 MG tablet Commonly known as: ZESTRIL Take 2.5 mg by mouth at bedtime.   magnesium citrate Soln Take 296 mLs (1 Bottle total) by mouth once as needed for severe constipation.   magnesium oxide 400 MG tablet Commonly known as: MAG-OX Take 400 mg by mouth every evening.   metFORMIN 1000 MG tablet Commonly known as: GLUCOPHAGE Take 500 mg by  mouth 2 (two) times daily with a meal.   nitroGLYCERIN 0.4 MG SL tablet Commonly known as: NITROSTAT Place 0.4 mg under the tongue every 5 (five) minutes as needed for chest pain.   ondansetron 4 MG tablet Commonly known as: ZOFRAN Take 1 tablet (4 mg total) by mouth every 6 (six) hours as needed for nausea.   oxyCODONE 5 MG immediate release tablet Commonly known as: Oxy IR/ROXICODONE Take 1-2 tablets (5-10 mg total) by mouth every 4 (four) hours as needed for moderate pain (pain score 4-6).   pantoprazole 40 MG tablet Commonly known as: PROTONIX Take 1 tablet (40 mg total) by mouth daily.   rifaximin 550 MG Tabs tablet Commonly known as: XIFAXAN Take 550 mg by mouth 2 (two) times daily.   rosuvastatin 20 MG tablet Commonly known as: CRESTOR Take 1 tablet (20 mg total) by mouth daily.   senna-docusate 8.6-50 MG tablet Commonly known as: Senokot-S Take 1 tablet by mouth at bedtime as needed for mild constipation.   simethicone 80 MG chewable tablet Commonly known as: MYLICON Chew 254 mg by mouth at bedtime.   traZODone 100 MG tablet Commonly known as: DESYREL Take 100 mg by mouth at bedtime.       Diagnostic Studies: DG Shoulder Left Port  Result Date: 09/01/2019 CLINICAL DATA:  Status post left shoulder replacement. EXAM: LEFT SHOULDER COMPARISON:  None. FINDINGS:  The left acetabular and humeral components appear to be well situated. Expected postoperative changes are seen in the surrounding soft tissues. IMPRESSION: Status post left shoulder arthroplasty. Electronically Signed   By: Marijo Conception M.D.   On: 09/01/2019 11:37   [place] OR NERVE BLOCK-IMAGE ONLY [facility])  Result Date: 09/01/2019 There is no interpretation for this exam.  This order is for images obtained during a surgical procedure.  Please See "Surgeries" Tab for more information regarding the procedure.   Disposition: Plan for discharge home this afternoon pending continued glucose monitoring.  Follow-up Information    Deberah Pelton, MD Follow up.   Specialty: Internal Medicine Contact information: [place] [address] [phone]        Lattie Corns, PA-C Follow up in 14 day(s).   Specialty: Physician Assistant Why: Electa Sniff information: [place] [address] [phone]          Signed: Judson Roch PA-C 09/02/2019, 8:01 AM

## 2019-09-02 NOTE — Progress Notes (Signed)
Walked around nurses station several time. Mild dizziness. In recliner, ice water polar care back on. SCD off.

## 2019-09-02 NOTE — Progress Notes (Signed)
Subjective: 1 Day Post-Op Procedure(s) (LRB): REVERSE SHOULDER ARTHROPLASTY (Left) Patient reports pain as mild.   Interscalene block still in effect for the left arm. Patient is well but having issues with hyperglycemia. Plan is to go Home after hospital stay. Negative for chest pain and shortness of breath Fever: no Gastrointestinal:Negative for nausea and vomiting  Objective: Vital signs in last 24 hours: Temp:  [97.2 F (36.2 C)-97.8 F (36.6 C)] 97.5 F (36.4 C) (02/26 0339) Pulse Rate:  [49-70] 51 (02/26 0430) Resp:  [15-22] 16 (02/26 0339) BP: (108-144)/(44-72) 117/60 (02/26 0339) SpO2:  [90 %-98 %] 98 % (02/26 0339) Weight:  [93 kg] 93 kg (02/25 1742)  Intake/Output from previous day:  Intake/Output Summary (Last 24 hours) at 09/02/2019 0755 Last data filed at 09/02/2019 0344 Gross per 24 hour  Intake 1817.01 ml  Output 1752 ml  Net 65.01 ml    Intake/Output this shift: No intake/output data recorded.  Labs: Recent Labs    08/30/19 0949  HGB 13.5   Recent Labs    08/30/19 0949  WBC 5.9  RBC 4.34  HCT 41.0  PLT 141*   Recent Labs    08/30/19 0949 08/30/19 0949 09/01/19 2208 09/02/19 0552  NA 137  --   --  135  K 3.9  --   --  4.6  CL 102  --   --  106  CO2 29  --   --  24  BUN 9  --   --  22  CREATININE 0.74  --   --  0.93  GLUCOSE 259*   < > 430* 251*  CALCIUM 9.0  --   --  8.2*   < > = values in this interval not displayed.   No results for input(s): LABPT, INR in the last 72 hours.   EXAM General - Patient is Alert, Appropriate and Oriented Extremity - ABD soft Incision: dressing C/D/I No cellulitis present  Decreased sensation to light touch to the left arm this morning. Able to flex and extend fingers to the left hand. Dressing/Incision - clean, dry, no drainage Motor Function - intact, moving foot and toes well on exam.  Past Medical History:  Diagnosis Date  . Arthritis   . Coronary artery disease   . Diabetes mellitus  without complication ([place])   . GERD (gastroesophageal reflux disease)    food related only with spicy only  . Headache    h/o as a child-migraines  . History of hiatal hernia   . Hypercholesteremia   . Hypertension   . Liver lesion   . Primary cancer of kidney or ureter    KIDNEY CANCER  . PTSD (post-traumatic stress disorder)   . 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   Weight as of this encounter: 93 kg. Advance diet Up with therapy D/C IV fluids when tolerating po intake.  Labs reviewed this AM. Glucose 414 last night, placed on sliding scale insulin. Most recent glucose is 251. COntinue to monitor glucose levels today.  Will discuss follow-up with PCP after discharge for further evaluation. WBC 5.9, Hg 13.5 Continue walking and PT today. Plan for d/c home this afternoon.  DVT  Prophylaxis - Lovenox and Foot Pumps Non-weightbearing to the left arm.  Raquel Shanesha Bednarz, PA-C [facility] 09/02/2019, 7:55 AM

## 2019-09-02 NOTE — TOC Progression Note (Signed)
Transition of Care [facility]) - Progression Note    Patient Details  Name: Jerry [name] MRN: [id number] Date of Birth: [birth year]  Transition of Care [facility]) CM/SW [place], RN Phone Number: 09/02/2019, 10:08 AM  Clinical Narrative:   [facility] Delice Lesch to [phone] to obtain Prior auth for [facility] services         Expected Discharge Plan and Services                                                 Social Determinants of Health (SDOH) Interventions    Readmission Risk Interventions No flowsheet data found.

## 2019-09-02 NOTE — Progress Notes (Signed)
DISCHARGE NOTE:  Pt given discharge instructions and scripts. Pt verbalized understanding.  Pts surgical dressing in place, shoulder immobilizer in place. Polar care sent with pt. Wife at bedside, pt wheeled to car by staff, wife providing transportation.

## 2019-09-02 NOTE — TOC Transition Note (Signed)
Transition of Care (TOC) - CM/SW Discharge Note Spoke with the patient to discuss DC plan and needs He lives alone and his ex wife will come and stay with him He has two steps to get into his home and stated that he would be fine as long as his exwife just holds to him for stability, He does not need DME he says  I set him up with [facility] thru [facility] and faxed the clinical to the [place] for prior approval, I notified Helene Kelp with [facility] that it was faxed for prior approval  Patient Details  Name: Jerry [name] MRN: [id number] Date of Birth: Dec 01, 1950  Transition of Care [facility]) CM/SW Contact:  Su Hilt, RN Phone Number: 09/02/2019, 10:19 AM   Clinical Narrative:       Final next level of care: [place] Barriers to Discharge: Barriers Resolved   Patient Goals and CMS Choice Patient states their goals for this hospitalization and ongoing recovery are:: go home      Discharge Placement                       Discharge Plan and Services                DME Arranged: N/A         HH Arranged: PT, OT [place] Agency: [facility] [employer] Date [place]: 09/02/19 Time [place]: 1018 Representative spoke with at [place]: [place] ([place]) Interventions     Readmission Risk Interventions No flowsheet data found.

## 2019-09-02 NOTE — Evaluation (Signed)
Occupational Therapy Evaluation Patient Details Name: Jerry [name] MRN: [id number] DOB: 01-10-1951 Today's Date: 09/02/2019    History of Present Illness Jerry [name] is a 39yoM who comes to [facility] on 2/25 for elective rTSA on Left. PTA pt reports no limitations with transfers, AMB, no DME use.   Clinical Impression   Pt was seen for OT evaluation this date POD #1 from L rTSA. Prior to hospital admission, pt was Indep with all self care ADLs and ADL mobility with no AD. Pt lives alone in [place] mobile home with 2 STE. Currently pt demonstrates impairments as described below (See OT problem list) which functionally limit his ability to perform ADL/self-care tasks. Pt currently requires MOD A with UB/LB dressing/bathing and MAX A with polar care and sling mgt.  Pt would benefit from skilled OT to address noted impairments and functional limitations (see below for any additional details) in order to maximize safety and independence while minimizing falls risk and caregiver burden. Upon hospital discharge, recommend HHOT to maximize pt safety and return to functional independence during meaningful occupations of daily life.     Follow Up Recommendations  Home health OT;Supervision - Intermittent(supv for assist with polar care and sling mgt)    Equipment Recommendations  Other (comment)(ground level sock aid, reacher)    Recommendations for Other Services       Precautions / Restrictions Precautions Precautions: Fall Required Braces or Orthoses: Sling(sling to L UE) Restrictions Weight Bearing Restrictions: Yes LUE Weight Bearing: Non weight bearing      Mobility Bed Mobility               General bed mobility comments: seated in chair at start of session  Transfers Overall transfer level: Needs assistance Equipment used: None Transfers: Sit to/from Stand Sit to Stand: Supervision              Balance Overall balance assessment: Independent;No apparent balance deficits  (not formally assessed)                                         ADL either performed or assessed with clinical judgement   ADL Overall ADL's : Needs assistance/impaired                                       General ADL Comments: Pt requires MOD A for UB/LB bathing/dressing in sitting d/t immobilization of L UE. SBA for ADL transfers with no AD.     Vision Baseline Vision/History: Wears glasses Wears Glasses: Reading only Patient Visual Report: No change from baseline       Perception     Praxis      Pertinent Vitals/Pain Pain Assessment: 0-10 Pain Score: 6  Pain Location: Lt shoulder Pain Descriptors / Indicators: Aching;Grimacing Pain Intervention(s): Limited activity within patient's tolerance;Monitored during session     Hand Dominance Right   Extremity/Trunk Assessment Upper Extremity Assessment Upper Extremity Assessment: RUE deficits/detail;LUE deficits/detail RUE Deficits / Details: limited thumb opposition d/t OA. LUE Deficits / Details: elbow, wrist, and grip appear to be WFL LUE: Unable to fully assess due to pain;Unable to fully assess due to immobilization   Lower Extremity Assessment Lower Extremity Assessment: Overall WFL for tasks assessed       Communication Communication Communication: No difficulties   Cognition Arousal/Alertness:  Awake/alert Behavior During Therapy: WFL for tasks assessed/performed Overall Cognitive Status: Within Functional Limits for tasks assessed                                     General Comments       Exercises Other Exercises Other Exercises: OT facilitates education re: ADL-related guidelines following post-op shoulder status including bathing, dressing, grooming, sleep, and sling/polar care mgt. Handout issued. Pt demos good understanding, anticipate need for f/u. Other Exercises: OT facilitates education re: non-resistive digit and wrist exercise that pt can  complete I'ly to reduce risk of swelling. Pt demos good understanding.   Shoulder Instructions      Home Living Family/patient expects to be discharged to:: Private residence Living Arrangements: Alone Available Help at Discharge: Family(states ex-wife will be able to stay and help him out for a week) Type of Home: Mobile home Home Access: Stairs to enter Entrance Stairs-Number of Steps: 2 Entrance Stairs-Rails: Right;Left Home Layout: One level               Home Equipment: None          Prior Functioning/Environment Level of Independence: Independent        Comments: Pt reports driving, going to church, active lifestyle.        OT Problem List: Decreased range of motion;Decreased coordination;Decreased knowledge of use of DME or AE;Decreased knowledge of precautions;Impaired sensation;Pain      OT Treatment/Interventions: Self-care/ADL training;Therapeutic exercise;DME and/or AE instruction;Therapeutic activities;Patient/family education    OT Goals(Current goals can be found in the care plan section) Acute Rehab OT Goals Patient Stated Goal: to go home and heal up OT Goal Formulation: With patient Time For Goal Achievement: 09/16/19 Potential to Achieve Goals: Good  OT Frequency: Min 2X/week   Barriers to D/C: Decreased caregiver support  ex-spouse only able to assist for one week per pt report       Co-evaluation              AM-PAC OT "6 Clicks" Daily Activity     Outcome Measure Help from another person eating meals?: A Little Help from another person taking care of personal grooming?: A Little Help from another person toileting, which includes using toliet, bedpan, or urinal?: A Little Help from another person bathing (including washing, rinsing, drying)?: A Lot Help from another person to put on and taking off regular upper body clothing?: A Little Help from another person to put on and taking off regular lower body clothing?: A Lot 6 Click  Score: 16   End of Session    Activity Tolerance: Patient tolerated treatment well Patient left: in chair;with call bell/phone within reach  OT Visit Diagnosis: Muscle weakness (generalized) (M62.81);Pain Pain - Right/Left: Left Pain - part of body: Shoulder                Time: 1126-1222 OT Time Calculation (min): 56 min Charges:  OT General Charges $OT Visit: 1 Visit OT Evaluation $OT Eval Low Complexity: 1 Low OT Treatments $Self Care/Home Management : 23-37 mins $Therapeutic Activity: 8-22 mins  Gerrianne Scale, MS, OTR/L ascom [phone] 09/02/19, 3:50 PM

## 2019-09-02 NOTE — Discharge Instructions (Signed)
Diet: As you were doing prior to hospitalization   Shower:  May shower but keep the wounds dry, use an occlusive plastic wrap, NO SOAKING IN TUB.  If the bandage gets wet, change with a clean dry gauze.  Dressing:  You may change your dressing as needed. Change the dressing with sterile gauze dressing.    Activity:  Increase activity slowly as tolerated, but follow the weight bearing instructions below.  No lifting or driving for 6 weeks.  Weight Bearing:   Non-weightbearing to the left arm.  Blood Clot Prevention: Take 1 328m aspirin daily instead of 835maspirin for 14 days.  To prevent constipation: you may use a stool softener such as -  Colace (over the counter) 100 mg by mouth twice a day  Drink plenty of fluids (prune juice may be helpful) and high fiber foods Miralax (over the counter) for constipation as needed.    Itching:  If you experience itching with your medications, try taking only a single pain pill, or even half a pain pill at a time.  You may take up to 10 pain pills per day, and you can also use benadryl over the counter for itching or also to help with sleep.   Precautions:  If you experience chest pain or shortness of breath - call 911 immediately for transfer to the hospital emergency department!!  If you develop a fever greater that 101 F, purulent drainage from wound, increased redness or drainage from wound, or calf pain-Call [facility]                                            Follow- Up Appointment:  [facility]

## 2019-09-03 NOTE — Progress Notes (Signed)
2pm: Gordo visited pt. briefly to coordinate later signing of AD w/pt.'s exwife; pt. in bed w/arm cast/sling, awake, oriented, and in good spirits; pt. said exwife would arrive at approx. 3:30pm; pt. also said he had previously completed an AD at the [place] in [place], but as doc is over 10 yrs. old, pt. wanted to complete new document.  Beattie will return later when exwife present to discuss AD.   3:30pm -  Bruni returned to pt.'s rm. to help w/ AD completion; Pt. talked about his time serving in the [employer] in [place] while waiting for exwife to arrive; pt. said he was deeply affected by the things he witnessed during deployment in [place] when he returned; attended therapy sessions until his veteran therapist retired and was replaced w/a non-combat-seasoned psychologist who had no credibility in pt.'s assessment.  Pt. attends [employer], lives alone, has family outside [place] but does not appear to be in close contact with them. When exwife arrived, pt. dictated wishes to her as she helped fill out HCPoA and Living Will w/[facility] witnessing wishes accurately recorded.  Exwife is HCPoA; Pt. would not like heroic measures @ time of death, including a feeding tube; he WOULD like to be made comfortable and is an organ donor.  OF NOTE: Pt. would like to be buried next to his brother in a cemetery indicated on AD.  [place] instructed pt. to get document finalized @ bank w/notary and witnesses.  Binford and exwife grateful for [facility] visit and assistance.    09/02/19 1530  Clinical Encounter Type  Visited With Health care provider;Patient and family together;Patient  Visit Type Follow-up;Social support (Advanced Directive assistance)  Referral From Chaplain  Stress Factors  Patient Stress Factors Health changes;Loss of control

## 2019-09-05 LAB — SURGICAL PATHOLOGY

## 2021-04-01 ENCOUNTER — Other Ambulatory Visit: Payer: Self-pay

## 2021-04-01 ENCOUNTER — Inpatient Hospital Stay (HOSPITAL_COMMUNITY)
Admission: EM | Admit: 2021-04-01 | Discharge: 2021-04-12 | DRG: 444 | Disposition: A | Discharge status: Skilled Nursing Facility | Payer: No Typology Code available for payment source | Attending: Internal Medicine | Admitting: Internal Medicine

## 2021-04-01 ENCOUNTER — Encounter (HOSPITAL_COMMUNITY): Payer: Self-pay | Admitting: Emergency Medicine

## 2021-04-01 DIAGNOSIS — K7581 Nonalcoholic steatohepatitis (NASH): Secondary | ICD-10-CM | POA: Diagnosis present

## 2021-04-01 DIAGNOSIS — Z823 Family history of stroke: Secondary | ICD-10-CM

## 2021-04-01 DIAGNOSIS — Z20822 Contact with and (suspected) exposure to covid-19: Secondary | ICD-10-CM | POA: Diagnosis present

## 2021-04-01 DIAGNOSIS — K4090 Unilateral inguinal hernia, without obstruction or gangrene, not specified as recurrent: Secondary | ICD-10-CM | POA: Diagnosis present

## 2021-04-01 DIAGNOSIS — Z8249 Family history of ischemic heart disease and other diseases of the circulatory system: Secondary | ICD-10-CM

## 2021-04-01 DIAGNOSIS — K7682 Hepatic encephalopathy: Secondary | ICD-10-CM | POA: Diagnosis not present

## 2021-04-01 DIAGNOSIS — Z23 Encounter for immunization: Secondary | ICD-10-CM

## 2021-04-01 DIAGNOSIS — I700 Atherosclerosis of aorta: Secondary | ICD-10-CM | POA: Diagnosis present

## 2021-04-01 DIAGNOSIS — D6959 Other secondary thrombocytopenia: Secondary | ICD-10-CM | POA: Diagnosis present

## 2021-04-01 DIAGNOSIS — Z9049 Acquired absence of other specified parts of digestive tract: Secondary | ICD-10-CM

## 2021-04-01 DIAGNOSIS — K7210 Chronic hepatic failure without coma: Secondary | ICD-10-CM | POA: Diagnosis present

## 2021-04-01 DIAGNOSIS — I81 Portal vein thrombosis: Secondary | ICD-10-CM | POA: Diagnosis present

## 2021-04-01 DIAGNOSIS — E8809 Other disorders of plasma-protein metabolism, not elsewhere classified: Secondary | ICD-10-CM | POA: Diagnosis present

## 2021-04-01 DIAGNOSIS — E875 Hyperkalemia: Secondary | ICD-10-CM | POA: Diagnosis not present

## 2021-04-01 DIAGNOSIS — K8051 Calculus of bile duct without cholangitis or cholecystitis with obstruction: Secondary | ICD-10-CM | POA: Diagnosis present

## 2021-04-01 DIAGNOSIS — N400 Enlarged prostate without lower urinary tract symptoms: Secondary | ICD-10-CM | POA: Diagnosis present

## 2021-04-01 DIAGNOSIS — Z9181 History of falling: Secondary | ICD-10-CM

## 2021-04-01 DIAGNOSIS — K8071 Calculus of gallbladder and bile duct without cholecystitis with obstruction: Secondary | ICD-10-CM | POA: Diagnosis not present

## 2021-04-01 DIAGNOSIS — R54 Age-related physical debility: Secondary | ICD-10-CM | POA: Diagnosis present

## 2021-04-01 DIAGNOSIS — D72829 Elevated white blood cell count, unspecified: Secondary | ICD-10-CM | POA: Diagnosis present

## 2021-04-01 DIAGNOSIS — K7460 Unspecified cirrhosis of liver: Secondary | ICD-10-CM | POA: Diagnosis present

## 2021-04-01 DIAGNOSIS — Z419 Encounter for procedure for purposes other than remedying health state, unspecified: Secondary | ICD-10-CM

## 2021-04-01 DIAGNOSIS — E872 Acidosis, unspecified: Secondary | ICD-10-CM | POA: Diagnosis not present

## 2021-04-01 DIAGNOSIS — D638 Anemia in other chronic diseases classified elsewhere: Secondary | ICD-10-CM | POA: Diagnosis present

## 2021-04-01 DIAGNOSIS — I10 Essential (primary) hypertension: Secondary | ICD-10-CM | POA: Diagnosis present

## 2021-04-01 DIAGNOSIS — K219 Gastro-esophageal reflux disease without esophagitis: Secondary | ICD-10-CM | POA: Diagnosis present

## 2021-04-01 DIAGNOSIS — R17 Unspecified jaundice: Secondary | ICD-10-CM

## 2021-04-01 DIAGNOSIS — E440 Moderate protein-calorie malnutrition: Secondary | ICD-10-CM | POA: Insufficient documentation

## 2021-04-01 DIAGNOSIS — R109 Unspecified abdominal pain: Secondary | ICD-10-CM | POA: Diagnosis not present

## 2021-04-01 DIAGNOSIS — E1165 Type 2 diabetes mellitus with hyperglycemia: Secondary | ICD-10-CM | POA: Diagnosis present

## 2021-04-01 DIAGNOSIS — Z7984 Long term (current) use of oral hypoglycemic drugs: Secondary | ICD-10-CM

## 2021-04-01 DIAGNOSIS — K7200 Acute and subacute hepatic failure without coma: Secondary | ICD-10-CM | POA: Diagnosis present

## 2021-04-01 DIAGNOSIS — K819 Cholecystitis, unspecified: Secondary | ICD-10-CM

## 2021-04-01 DIAGNOSIS — Z85528 Personal history of other malignant neoplasm of kidney: Secondary | ICD-10-CM

## 2021-04-01 DIAGNOSIS — E7800 Pure hypercholesterolemia, unspecified: Secondary | ICD-10-CM | POA: Diagnosis present

## 2021-04-01 DIAGNOSIS — E7220 Disorder of urea cycle metabolism, unspecified: Secondary | ICD-10-CM

## 2021-04-01 DIAGNOSIS — Z881 Allergy status to other antibiotic agents status: Secondary | ICD-10-CM

## 2021-04-01 DIAGNOSIS — E43 Unspecified severe protein-calorie malnutrition: Secondary | ICD-10-CM | POA: Diagnosis present

## 2021-04-01 DIAGNOSIS — E1151 Type 2 diabetes mellitus with diabetic peripheral angiopathy without gangrene: Secondary | ICD-10-CM | POA: Diagnosis present

## 2021-04-01 DIAGNOSIS — Z86718 Personal history of other venous thrombosis and embolism: Secondary | ICD-10-CM

## 2021-04-01 DIAGNOSIS — L299 Pruritus, unspecified: Secondary | ICD-10-CM | POA: Diagnosis not present

## 2021-04-01 DIAGNOSIS — Z955 Presence of coronary angioplasty implant and graft: Secondary | ICD-10-CM

## 2021-04-01 DIAGNOSIS — K3189 Other diseases of stomach and duodenum: Secondary | ICD-10-CM | POA: Diagnosis present

## 2021-04-01 DIAGNOSIS — Z801 Family history of malignant neoplasm of trachea, bronchus and lung: Secondary | ICD-10-CM

## 2021-04-01 DIAGNOSIS — I2510 Atherosclerotic heart disease of native coronary artery without angina pectoris: Secondary | ICD-10-CM | POA: Diagnosis present

## 2021-04-01 DIAGNOSIS — Z96612 Presence of left artificial shoulder joint: Secondary | ICD-10-CM | POA: Diagnosis present

## 2021-04-01 DIAGNOSIS — K766 Portal hypertension: Secondary | ICD-10-CM | POA: Diagnosis present

## 2021-04-01 DIAGNOSIS — G9340 Encephalopathy, unspecified: Secondary | ICD-10-CM

## 2021-04-01 DIAGNOSIS — Z905 Acquired absence of kidney: Secondary | ICD-10-CM

## 2021-04-01 DIAGNOSIS — G8929 Other chronic pain: Secondary | ICD-10-CM | POA: Diagnosis present

## 2021-04-01 DIAGNOSIS — Z888 Allergy status to other drugs, medicaments and biological substances status: Secondary | ICD-10-CM

## 2021-04-01 DIAGNOSIS — B179 Acute viral hepatitis, unspecified: Secondary | ICD-10-CM | POA: Diagnosis present

## 2021-04-01 DIAGNOSIS — Z66 Do not resuscitate: Secondary | ICD-10-CM | POA: Diagnosis not present

## 2021-04-01 DIAGNOSIS — Z79899 Other long term (current) drug therapy: Secondary | ICD-10-CM

## 2021-04-01 DIAGNOSIS — Z6825 Body mass index (BMI) 25.0-25.9, adult: Secondary | ICD-10-CM

## 2021-04-01 DIAGNOSIS — E871 Hypo-osmolality and hyponatremia: Secondary | ICD-10-CM

## 2021-04-01 DIAGNOSIS — Z7901 Long term (current) use of anticoagulants: Secondary | ICD-10-CM

## 2021-04-01 DIAGNOSIS — K7469 Other cirrhosis of liver: Secondary | ICD-10-CM | POA: Diagnosis present

## 2021-04-01 DIAGNOSIS — I864 Gastric varices: Secondary | ICD-10-CM | POA: Diagnosis present

## 2021-04-01 DIAGNOSIS — R188 Other ascites: Secondary | ICD-10-CM | POA: Diagnosis present

## 2021-04-01 DIAGNOSIS — I8510 Secondary esophageal varices without bleeding: Secondary | ICD-10-CM | POA: Diagnosis present

## 2021-04-01 DIAGNOSIS — E119 Type 2 diabetes mellitus without complications: Secondary | ICD-10-CM

## 2021-04-01 DIAGNOSIS — R7401 Elevation of levels of liver transaminase levels: Secondary | ICD-10-CM | POA: Diagnosis present

## 2021-04-01 DIAGNOSIS — Z880 Allergy status to penicillin: Secondary | ICD-10-CM

## 2021-04-01 DIAGNOSIS — Z515 Encounter for palliative care: Secondary | ICD-10-CM

## 2021-04-01 DIAGNOSIS — F4310 Post-traumatic stress disorder, unspecified: Secondary | ICD-10-CM | POA: Diagnosis present

## 2021-04-01 DIAGNOSIS — R159 Full incontinence of feces: Secondary | ICD-10-CM | POA: Diagnosis present

## 2021-04-01 DIAGNOSIS — N179 Acute kidney failure, unspecified: Secondary | ICD-10-CM | POA: Diagnosis not present

## 2021-04-01 DIAGNOSIS — D689 Coagulation defect, unspecified: Secondary | ICD-10-CM | POA: Diagnosis present

## 2021-04-01 HISTORY — DX: Acquired absence of other specified parts of digestive tract: Z90.49

## 2021-04-01 HISTORY — DX: Presence of coronary angioplasty implant and graft: Z95.5

## 2021-04-01 LAB — CBC WITH DIFFERENTIAL/PLATELET
Abs Immature Granulocytes: 0.18 10*3/uL — ABNORMAL HIGH (ref 0.00–0.07)
Basophils Absolute: 0.1 10*3/uL (ref 0.0–0.1)
Basophils Relative: 1 %
Eosinophils Absolute: 0.3 10*3/uL (ref 0.0–0.5)
Eosinophils Relative: 3 %
HCT: 38.4 % — ABNORMAL LOW (ref 39.0–52.0)
Hemoglobin: 13.2 g/dL (ref 13.0–17.0)
Immature Granulocytes: 2 %
Lymphocytes Relative: 15 %
Lymphs Abs: 1.6 10*3/uL (ref 0.7–4.0)
MCH: 32.8 pg (ref 26.0–34.0)
MCHC: 34.4 g/dL (ref 30.0–36.0)
MCV: 95.5 fL (ref 80.0–100.0)
Monocytes Absolute: 0.9 10*3/uL (ref 0.1–1.0)
Monocytes Relative: 9 %
Neutro Abs: 7.4 10*3/uL (ref 1.7–7.7)
Neutrophils Relative %: 70 %
Platelets: 211 10*3/uL (ref 150–400)
RBC: 4.02 MIL/uL — ABNORMAL LOW (ref 4.22–5.81)
RDW: 16.7 % — ABNORMAL HIGH (ref 11.5–15.5)
WBC: 10.6 10*3/uL — ABNORMAL HIGH (ref 4.0–10.5)
nRBC: 0 % (ref 0.0–0.2)

## 2021-04-01 NOTE — ED Triage Notes (Signed)
Patient arrived with EMS from home reports chronic generalized abdominal pain for 4 years worse this week with mild distention . CBG= 235.

## 2021-04-01 NOTE — ED Provider Notes (Signed)
Emergency Medicine Provider Triage Evaluation Note  Jerry [name] , a 70 y.o. male  was evaluated in triage.  Pt complains of abdominal pain. Hx of chronic pain, but symptom worse x 1 week. There is some associated abdominal distension, nausea. Also reports lightheadedness worse with standing and activity. No fevers, vomiting, stool or urinary changes.  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 Last paracentesis 3 months ago.  Review of Systems  Positive: Abdominal pain Negative: V/D, fever  Physical Exam  BP (!) 145/82 (BP Location: Right Arm)   Pulse 81   Temp 99.1 F (37.3 C) (Oral)   Resp 18   SpO2 99%  Gen:   Awake, no distress. Chronically ill appearing. Resp:  Normal effort  MSK:   Moves extremities without difficulty  Other:  Skin is jaundiced. Abdomen distended. Generally tender, worse in bilateral lower quadrants.  Medical Decision Making  Medically screening exam initiated at 10:53 PM.  Appropriate orders placed.  Dixie Dials was informed that the remainder of the evaluation will be completed by another provider, this initial triage assessment does not replace that evaluation, and the importance of remaining in the ED until their evaluation is complete.  Abdominal pain   Antonietta Breach, Hershal Coria 04/01/21 2256    Dorie Rank, MD 04/01/21 [phone]

## 2021-04-02 ENCOUNTER — Emergency Department (HOSPITAL_COMMUNITY): Payer: No Typology Code available for payment source

## 2021-04-02 ENCOUNTER — Inpatient Hospital Stay (HOSPITAL_COMMUNITY): Payer: No Typology Code available for payment source

## 2021-04-02 DIAGNOSIS — I81 Portal vein thrombosis: Secondary | ICD-10-CM | POA: Diagnosis present

## 2021-04-02 DIAGNOSIS — E43 Unspecified severe protein-calorie malnutrition: Secondary | ICD-10-CM | POA: Diagnosis present

## 2021-04-02 DIAGNOSIS — E1165 Type 2 diabetes mellitus with hyperglycemia: Secondary | ICD-10-CM | POA: Diagnosis present

## 2021-04-02 DIAGNOSIS — I864 Gastric varices: Secondary | ICD-10-CM | POA: Diagnosis present

## 2021-04-02 DIAGNOSIS — Z20822 Contact with and (suspected) exposure to covid-19: Secondary | ICD-10-CM | POA: Diagnosis present

## 2021-04-02 DIAGNOSIS — E7220 Disorder of urea cycle metabolism, unspecified: Secondary | ICD-10-CM | POA: Diagnosis not present

## 2021-04-02 DIAGNOSIS — R748 Abnormal levels of other serum enzymes: Secondary | ICD-10-CM

## 2021-04-02 DIAGNOSIS — R109 Unspecified abdominal pain: Secondary | ICD-10-CM | POA: Diagnosis present

## 2021-04-02 DIAGNOSIS — K7200 Acute and subacute hepatic failure without coma: Secondary | ICD-10-CM | POA: Diagnosis present

## 2021-04-02 DIAGNOSIS — R188 Other ascites: Secondary | ICD-10-CM | POA: Diagnosis present

## 2021-04-02 DIAGNOSIS — K219 Gastro-esophageal reflux disease without esophagitis: Secondary | ICD-10-CM | POA: Diagnosis present

## 2021-04-02 DIAGNOSIS — E1151 Type 2 diabetes mellitus with diabetic peripheral angiopathy without gangrene: Secondary | ICD-10-CM | POA: Diagnosis present

## 2021-04-02 DIAGNOSIS — K7460 Unspecified cirrhosis of liver: Secondary | ICD-10-CM

## 2021-04-02 DIAGNOSIS — R17 Unspecified jaundice: Secondary | ICD-10-CM | POA: Diagnosis present

## 2021-04-02 DIAGNOSIS — I700 Atherosclerosis of aorta: Secondary | ICD-10-CM | POA: Diagnosis present

## 2021-04-02 DIAGNOSIS — E872 Acidosis, unspecified: Secondary | ICD-10-CM | POA: Diagnosis not present

## 2021-04-02 DIAGNOSIS — D638 Anemia in other chronic diseases classified elsewhere: Secondary | ICD-10-CM | POA: Diagnosis present

## 2021-04-02 DIAGNOSIS — B179 Acute viral hepatitis, unspecified: Secondary | ICD-10-CM | POA: Diagnosis present

## 2021-04-02 DIAGNOSIS — Z515 Encounter for palliative care: Secondary | ICD-10-CM | POA: Diagnosis not present

## 2021-04-02 DIAGNOSIS — K8050 Calculus of bile duct without cholangitis or cholecystitis without obstruction: Secondary | ICD-10-CM

## 2021-04-02 DIAGNOSIS — I10 Essential (primary) hypertension: Secondary | ICD-10-CM

## 2021-04-02 DIAGNOSIS — K7581 Nonalcoholic steatohepatitis (NASH): Secondary | ICD-10-CM | POA: Diagnosis not present

## 2021-04-02 DIAGNOSIS — G9340 Encephalopathy, unspecified: Secondary | ICD-10-CM | POA: Diagnosis not present

## 2021-04-02 DIAGNOSIS — R7401 Elevation of levels of liver transaminase levels: Secondary | ICD-10-CM | POA: Diagnosis present

## 2021-04-02 DIAGNOSIS — K7210 Chronic hepatic failure without coma: Secondary | ICD-10-CM | POA: Diagnosis present

## 2021-04-02 DIAGNOSIS — Z66 Do not resuscitate: Secondary | ICD-10-CM | POA: Diagnosis not present

## 2021-04-02 DIAGNOSIS — K8051 Calculus of bile duct without cholangitis or cholecystitis with obstruction: Secondary | ICD-10-CM | POA: Diagnosis not present

## 2021-04-02 DIAGNOSIS — D72829 Elevated white blood cell count, unspecified: Secondary | ICD-10-CM | POA: Diagnosis present

## 2021-04-02 DIAGNOSIS — K8071 Calculus of gallbladder and bile duct without cholecystitis with obstruction: Secondary | ICD-10-CM | POA: Diagnosis present

## 2021-04-02 DIAGNOSIS — N179 Acute kidney failure, unspecified: Secondary | ICD-10-CM | POA: Diagnosis not present

## 2021-04-02 DIAGNOSIS — E119 Type 2 diabetes mellitus without complications: Secondary | ICD-10-CM | POA: Diagnosis not present

## 2021-04-02 DIAGNOSIS — I8510 Secondary esophageal varices without bleeding: Secondary | ICD-10-CM | POA: Diagnosis present

## 2021-04-02 DIAGNOSIS — Z7189 Other specified counseling: Secondary | ICD-10-CM | POA: Diagnosis not present

## 2021-04-02 DIAGNOSIS — D689 Coagulation defect, unspecified: Secondary | ICD-10-CM | POA: Diagnosis present

## 2021-04-02 DIAGNOSIS — K766 Portal hypertension: Secondary | ICD-10-CM | POA: Insufficient documentation

## 2021-04-02 DIAGNOSIS — Z23 Encounter for immunization: Secondary | ICD-10-CM | POA: Diagnosis not present

## 2021-04-02 DIAGNOSIS — K7682 Hepatic encephalopathy: Secondary | ICD-10-CM | POA: Diagnosis not present

## 2021-04-02 DIAGNOSIS — K7469 Other cirrhosis of liver: Secondary | ICD-10-CM | POA: Diagnosis not present

## 2021-04-02 DIAGNOSIS — D6959 Other secondary thrombocytopenia: Secondary | ICD-10-CM | POA: Diagnosis present

## 2021-04-02 LAB — HEPATIC FUNCTION PANEL
ALT: 92 U/L — ABNORMAL HIGH (ref 0–44)
AST: 138 U/L — ABNORMAL HIGH (ref 15–41)
Albumin: 1.9 g/dL — ABNORMAL LOW (ref 3.5–5.0)
Alkaline Phosphatase: 208 U/L — ABNORMAL HIGH (ref 38–126)
Bilirubin, Direct: 5.9 mg/dL — ABNORMAL HIGH (ref 0.0–0.2)
Indirect Bilirubin: 4.1 mg/dL — ABNORMAL HIGH (ref 0.3–0.9)
Total Bilirubin: 10 mg/dL — ABNORMAL HIGH (ref 0.3–1.2)
Total Protein: 6.8 g/dL (ref 6.5–8.1)

## 2021-04-02 LAB — COMPREHENSIVE METABOLIC PANEL
ALT: 105 U/L — ABNORMAL HIGH (ref 0–44)
AST: 149 U/L — ABNORMAL HIGH (ref 15–41)
Albumin: 2.2 g/dL — ABNORMAL LOW (ref 3.5–5.0)
Alkaline Phosphatase: 264 U/L — ABNORMAL HIGH (ref 38–126)
Anion gap: 6 (ref 5–15)
BUN: 27 mg/dL — ABNORMAL HIGH (ref 8–23)
CO2: 19 mmol/L — ABNORMAL LOW (ref 22–32)
Calcium: 9 mg/dL (ref 8.9–10.3)
Chloride: 107 mmol/L (ref 98–111)
Creatinine, Ser: 1.16 mg/dL (ref 0.61–1.24)
GFR, Estimated: >60 mL/min (ref >60)
Glucose, Bld: 77 mg/dL (ref 70–99)
Potassium: 4.3 mmol/L (ref 3.5–5.1)
Sodium: 132 mmol/L — ABNORMAL LOW (ref 135–145)
Total Bilirubin: 10.8 mg/dL — ABNORMAL HIGH (ref 0.3–1.2)
Total Protein: 7.7 g/dL (ref 6.5–8.1)

## 2021-04-02 LAB — LIPASE, BLOOD: Lipase: 90 U/L — ABNORMAL HIGH (ref 11–51)

## 2021-04-02 LAB — URINALYSIS, ROUTINE W REFLEX MICROSCOPIC
Bacteria, UA: NONE SEEN
Glucose, UA: NEGATIVE mg/dL
Hgb urine dipstick: NEGATIVE
Ketones, ur: NEGATIVE mg/dL
Leukocytes,Ua: NEGATIVE
Nitrite: NEGATIVE
Protein, ur: NEGATIVE mg/dL
Specific Gravity, Urine: 1.036 — ABNORMAL HIGH (ref 1.005–1.030)
pH: 5 (ref 5.0–8.0)

## 2021-04-02 LAB — URINE CULTURE

## 2021-04-02 LAB — APTT
aPTT: 38 seconds — ABNORMAL HIGH (ref 24–36)
aPTT: 74 seconds — ABNORMAL HIGH (ref 24–36)

## 2021-04-02 LAB — CBG MONITORING, ED
Glucose-Capillary: 60 mg/dL — ABNORMAL LOW (ref 70–99)
Glucose-Capillary: 83 mg/dL (ref 70–99)
Glucose-Capillary: 52 mg/dL — ABNORMAL LOW (ref 70–99)
Glucose-Capillary: 151 mg/dL — ABNORMAL HIGH (ref 70–99)
Glucose-Capillary: 188 mg/dL — ABNORMAL HIGH (ref 70–99)

## 2021-04-02 LAB — AMMONIA: Ammonia: 72 umol/L — ABNORMAL HIGH (ref 9–35)

## 2021-04-02 LAB — HEPARIN LEVEL (UNFRACTIONATED): Heparin Unfractionated: 0.13 IU/mL — ABNORMAL LOW (ref 0.30–0.70)

## 2021-04-02 LAB — PROTIME-INR
INR: 1.5 — ABNORMAL HIGH (ref 0.8–1.2)
INR: 1.6 — ABNORMAL HIGH (ref 0.8–1.2)
Prothrombin Time: 17.9 seconds — ABNORMAL HIGH (ref 11.4–15.2)
Prothrombin Time: 18.7 seconds — ABNORMAL HIGH (ref 11.4–15.2)

## 2021-04-02 LAB — HEPATITIS PANEL, ACUTE
HCV Ab: NONREACTIVE
Hep A IgM: NONREACTIVE
Hep B C IgM: NONREACTIVE
Hepatitis B Surface Ag: NONREACTIVE

## 2021-04-02 LAB — RESP PANEL BY RT-PCR (FLU A&B, COVID) ARPGX2
Influenza A by PCR: NEGATIVE
Influenza B by PCR: NEGATIVE
SARS Coronavirus 2 by RT PCR: NEGATIVE

## 2021-04-02 MED ORDER — LACTULOSE 10 GM/15ML PO SOLN
20.0000 g | Freq: Once | ORAL | Status: AC
  Administered 2021-04-02: 20 g via ORAL
  Filled 2021-04-02: qty 30

## 2021-04-02 MED ORDER — SODIUM CHLORIDE 0.9% FLUSH
3.0000 mL | Freq: Two times a day (BID) | INTRAVENOUS | Status: DC
  Administered 2021-04-02 – 2021-04-12 (×16): 3 mL via INTRAVENOUS

## 2021-04-02 MED ORDER — ACETAMINOPHEN 325 MG PO TABS
650.0000 mg | ORAL_TABLET | Freq: Four times a day (QID) | ORAL | Status: DC | PRN
  Administered 2021-04-02: 650 mg via ORAL
  Filled 2021-04-02 (×2): qty 2

## 2021-04-02 MED ORDER — ONDANSETRON HCL 4 MG/2ML IJ SOLN
4.0000 mg | Freq: Four times a day (QID) | INTRAMUSCULAR | Status: DC | PRN
  Administered 2021-04-03: 4 mg via INTRAVENOUS
  Filled 2021-04-02: qty 2

## 2021-04-02 MED ORDER — SPIRONOLACTONE 25 MG PO TABS
50.0000 mg | ORAL_TABLET | Freq: Every day | ORAL | Status: DC
  Administered 2021-04-04: 50 mg via ORAL
  Filled 2021-04-02: qty 2

## 2021-04-02 MED ORDER — ISOSORBIDE MONONITRATE ER 60 MG PO TB24
90.0000 mg | ORAL_TABLET | Freq: Every morning | ORAL | Status: DC
  Administered 2021-04-02 – 2021-04-12 (×9): 90 mg via ORAL
  Filled 2021-04-02 (×7): qty 1
  Filled 2021-04-02: qty 3
  Filled 2021-04-02: qty 1

## 2021-04-02 MED ORDER — CARVEDILOL 3.125 MG PO TABS: 3.1250 mg | ORAL_TABLET | Freq: Two times a day (BID) | ORAL | Status: DC

## 2021-04-02 MED ORDER — FUROSEMIDE 20 MG PO TABS
20.0000 mg | ORAL_TABLET | Freq: Two times a day (BID) | ORAL | Status: DC
  Administered 2021-04-03 – 2021-04-04 (×2): 20 mg via ORAL
  Filled 2021-04-02 (×2): qty 1

## 2021-04-02 MED ORDER — PANTOPRAZOLE SODIUM 40 MG PO TBEC
40.0000 mg | DELAYED_RELEASE_TABLET | Freq: Two times a day (BID) | ORAL | Status: DC
  Administered 2021-04-02 – 2021-04-09 (×14): 40 mg via ORAL
  Filled 2021-04-02 (×14): qty 1

## 2021-04-02 MED ORDER — INSULIN ASPART 100 UNIT/ML IJ SOLN
0.0000 [IU] | Freq: Three times a day (TID) | INTRAMUSCULAR | Status: DC
  Administered 2021-04-03: 1 [IU] via SUBCUTANEOUS
  Administered 2021-04-04: 4 [IU] via SUBCUTANEOUS

## 2021-04-02 MED ORDER — HEPARIN (PORCINE) 25000 UT/250ML-% IV SOLN
1400.0000 [IU]/h | INTRAVENOUS | Status: AC
  Administered 2021-04-02 – 2021-04-03 (×2): 1400 [IU]/h via INTRAVENOUS
  Filled 2021-04-02 (×2): qty 250

## 2021-04-02 MED ORDER — CETIRIZINE HCL 10 MG PO TABS
5.0000 mg | ORAL_TABLET | Freq: Every day | ORAL | Status: DC
Start: 2021-04-03 — End: 2021-04-13
  Administered 2021-04-04 – 2021-04-12 (×9): 5 mg via ORAL
  Filled 2021-04-02 (×10): qty 1

## 2021-04-02 MED ORDER — GADOBUTROL 1 MMOL/ML IV SOLN
9.0000 mL | Freq: Once | INTRAVENOUS | Status: AC | PRN
  Administered 2021-04-02: 9 mL via INTRAVENOUS

## 2021-04-02 MED ORDER — POLYETHYLENE GLYCOL 3350 17 G PO PACK: 8.5000 g | PACK | Freq: Every day | ORAL | Status: DC | PRN

## 2021-04-02 MED ORDER — LORATADINE 10 MG PO TABS
10.0000 mg | ORAL_TABLET | Freq: Every day | ORAL | Status: DC
  Administered 2021-04-02 – 2021-04-06 (×5): 10 mg via ORAL
  Filled 2021-04-02 (×5): qty 1

## 2021-04-02 MED ORDER — CARVEDILOL 3.125 MG PO TABS
3.1250 mg | ORAL_TABLET | Freq: Two times a day (BID) | ORAL | Status: DC
  Administered 2021-04-02 – 2021-04-12 (×20): 3.125 mg via ORAL
  Filled 2021-04-02 (×20): qty 1

## 2021-04-02 MED ORDER — ONDANSETRON HCL 4 MG/2ML IJ SOLN
4.0000 mg | Freq: Once | INTRAMUSCULAR | Status: AC
  Administered 2021-04-02: 4 mg via INTRAVENOUS
  Filled 2021-04-02: qty 2

## 2021-04-02 MED ORDER — PRAMOXINE-HC 1-1 % EX FOAM
1.0000 g | Freq: Four times a day (QID) | CUTANEOUS | Status: DC | PRN
  Filled 2021-04-02: qty 10

## 2021-04-02 MED ORDER — ACETAMINOPHEN 650 MG RE SUPP: 650.0000 mg | Freq: Four times a day (QID) | RECTAL | Status: DC | PRN

## 2021-04-02 MED ORDER — DEXTROSE 50 % IV SOLN
1.0000 | INTRAVENOUS | Status: DC | PRN
  Administered 2021-04-02: 50 mL via INTRAVENOUS
  Filled 2021-04-02 (×2): qty 50

## 2021-04-02 MED ORDER — TRIAMCINOLONE ACETONIDE 0.1 % EX CREA
1.0000 "application " | TOPICAL_CREAM | Freq: Two times a day (BID) | CUTANEOUS | Status: DC | PRN
  Administered 2021-04-02 – 2021-04-03 (×2): 1 via TOPICAL
  Filled 2021-04-02 (×2): qty 15

## 2021-04-02 MED ORDER — LORATADINE 10 MG PO TABS: 10.0000 mg | ORAL_TABLET | Freq: Every day | ORAL | Status: DC

## 2021-04-02 MED ORDER — GLUCERNA SHAKE PO LIQD
237.0000 mL | Freq: Two times a day (BID) | ORAL | Status: DC
  Filled 2021-04-02: qty 237

## 2021-04-02 MED ORDER — DIPHENHYDRAMINE HCL 50 MG/ML IJ SOLN
12.5000 mg | Freq: Once | INTRAMUSCULAR | Status: AC
  Administered 2021-04-02: 12.5 mg via INTRAVENOUS
  Filled 2021-04-02: qty 1

## 2021-04-02 MED ORDER — ENOXAPARIN SODIUM 40 MG/0.4ML IJ SOSY
40.0000 mg | PREFILLED_SYRINGE | INTRAMUSCULAR | Status: DC
  Administered 2021-04-02: 40 mg via SUBCUTANEOUS
  Filled 2021-04-02: qty 0.4

## 2021-04-02 MED ORDER — FUROSEMIDE 20 MG PO TABS: 20.0000 mg | ORAL_TABLET | Freq: Two times a day (BID) | ORAL | Status: DC

## 2021-04-02 MED ORDER — ALBUTEROL SULFATE (2.5 MG/3ML) 0.083% IN NEBU: 2.5000 mg | INHALATION_SOLUTION | Freq: Four times a day (QID) | RESPIRATORY_TRACT | Status: DC | PRN

## 2021-04-02 MED ORDER — FENTANYL CITRATE PF 50 MCG/ML IJ SOSY
50.0000 ug | PREFILLED_SYRINGE | Freq: Once | INTRAMUSCULAR | Status: AC
  Administered 2021-04-02: 50 ug via INTRAVENOUS
  Filled 2021-04-02: qty 1

## 2021-04-02 MED ORDER — ONDANSETRON HCL 4 MG PO TABS
4.0000 mg | ORAL_TABLET | Freq: Four times a day (QID) | ORAL | Status: DC | PRN
  Administered 2021-04-07 – 2021-04-08 (×2): 4 mg via ORAL
  Filled 2021-04-02 (×2): qty 1

## 2021-04-02 MED ORDER — IOHEXOL 350 MG/ML SOLN
80.0000 mL | Freq: Once | INTRAVENOUS | Status: AC | PRN
  Administered 2021-04-02: 80 mL via INTRAVENOUS

## 2021-04-02 MED ORDER — SODIUM CHLORIDE 0.9 % IV SOLN: Freq: Once | INTRAVENOUS | Status: AC

## 2021-04-02 NOTE — ED Notes (Signed)
Pt unable to provide urine specimen.

## 2021-04-02 NOTE — ED Notes (Signed)
In MRI via stretcher

## 2021-04-02 NOTE — Consult Note (Signed)
Referring Provider:  EDP Primary Care Physician:  Deberah Pelton, MD Primary Gastroenterologist:  Althia Forts  Reason for Consultation:  Elevated LFTs and abdominal pain  HPI: Jerry [name] is a 70 y.o. male with medical history significant of CAD with history of stent placement x2, diabetes mellitus type 2, nonalcoholic cirrhosis, portal vein thrombosis of the ileal vein and nonocclusive thrombus of the SMV and main portal veins, ascites status post paracentesis, kidney cancer, inguinal hernia and history of GI bleed who follows at the [place]. he presented to [facility] on 9/26 with complaints of diffuse abdominal pain and yellowing of the skin.  He tells me that his girlfriend is noted yellowing of his skin over the past couple of months.  He tells me he has chronic abdominal pain due to the portal vein thrombosis that was diagnosed 5 years ago, but his pain has acutely worsened over the course of the past several weeks.  He denies confusion, but has been unsteady and needing to use furniture to navigate.  Says he is trying to get a cane.  He is tearful and says he has no quality of life.  He says that he knows that he needs to eat, but when he tries to eat he becomes nauseous and sick feeling.  Upon presentation here he was found to have an increase in his LFTs with total bilirubin up to 10.8, alk phos to 264, ALT 105, AST 149.  Viral hepatitis panel is negative.  INR 1.5.  CT of the abdomen and pelvis with contrast showed the following:  IMPRESSION: Coronary artery calcification. Evidence of prior coronary artery stenting.   Changes of advanced cirrhosis. Interval thrombosis of the portal vein with peribiliary venous collateralization. Evidence of portal venous hypertension with portosystemic collateralization, progressive, moderate splenomegaly and mild, stable ascites.   Cholelithiasis.   Circumferential thickening of the cecum and ascending colon likely reflecting changes of  portal colopathy. Infectious or inflammatory colitis is possible, but considered less likely. No evidence of obstruction or perforation.   Moderate prostatic enlargement.   Aortic Atherosclerosis (ICD10-I70.0).  MRI of the abdomen/MRCP showed the following:  IMPRESSION: Portal venous thrombosis worse than in 2017 and with potential early cavernous transformation suggests there may be some chronicity. Acute on chronic thrombus is considered.   Edema about the RIGHT colon, correlate with any symptoms that would suggest portal colopathy exacerbated by portal thrombosis. 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   Cholelithiasis with choledocholithiasis but without biliary duct dilation. Correlation with symptoms may be helpful.   Signs of hepatic cirrhosis with splenomegaly and small volume ascites as on the previous CT.   Small foci of hyperenhancement near the dome of the RIGHT hemi liver 7 mm. In aggregate these are LR category 3. Three to six-month follow-up is suggested for further evaluation.   Small volume ascites about the RIGHT hemi liver.    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    Past Surgical History:  Procedure Laterality Date   APPENDECTOMY     age 84   CARPAL TUNNEL RELEASE Right    CORONARY STENT INTERVENTION  03/23/2017   STENT SYNERGY DES 2.69S85 drug eluting stent was successfully placed   CORONARY STENT INTERVENTION N/A 03/23/2017    Procedure: CORONARY STENT INTERVENTION;  Surgeon: Jettie Booze, MD;  Location: [place] [facility];  Service: Cardiovascular;  Laterality: N/A;   HAND SURGERY     LEFT HEART CATH AND CORONARY ANGIOGRAPHY N/A 03/23/2017   Procedure: LEFT HEART CATH AND CORONARY ANGIOGRAPHY;  Surgeon: Jettie Booze, MD;  Location: [place] [facility];  Service: Cardiovascular;  Laterality: N/A;   PARTIAL NEPHRECTOMY Left    PILONIDAL CYST EXCISION     REVERSE SHOULDER ARTHROPLASTY Left 09/01/2019   Procedure: REVERSE SHOULDER ARTHROPLASTY;  Surgeon: Corky Mull, MD;  Location: [facility];  Service: Orthopedics;  Laterality: Left;   SHOULDER SURGERY Left    x4   STENT PLACEMENT VASCULAR ([place] [facility]X)      Prior to Admission medications   Medication Sig Start Date End Date Taking? Authorizing Provider  acetaminophen (TYLENOL) 325 MG tablet Take 975 mg by mouth 2 (two) times daily.   Yes [provider]  apixaban (ELIQUIS) 5 MG TABS tablet Take 2.5 mg by mouth 2 (two) times daily. 02/26/21  Yes [provider]  carvedilol (COREG) 3.125 MG tablet Take 3.125 mg by mouth 2 (two) times daily. 10/31/20  Yes [provider]  cetirizine (ZYRTEC) 5 MG tablet Take 5 mg by mouth every morning. 03/06/21  Yes [provider]  feeding supplement, GLUCERNA SHAKE, (GLUCERNA SHAKE) LIQD Take 237 mLs by mouth 2 (two) times daily between meals.   Yes [provider]  furosemide (LASIX) 40 MG tablet Take 20 mg by mouth 2 (two) times daily.   Yes [provider]  glipiZIDE (GLUCOTROL) 10 MG tablet Take 10 mg by mouth 2 (two) times daily before a meal.   Yes [provider]  isosorbide mononitrate (IMDUR) 30 MG 24 hr tablet Take 90 mg by mouth every morning. 03/05/21  Yes [provider]  loratadine (CLARITIN) 10 MG tablet Take 10 mg by mouth at bedtime. 03/06/21  Yes [provider]  metFORMIN (GLUCOPHAGE-XR) 500 MG 24 hr tablet Take 500 mg by mouth 2  (two) times daily. 01/01/21  Yes [provider]  pantoprazole (PROTONIX) 40 MG tablet Take 1 tablet (40 mg total) by mouth daily. Patient taking differently: Take 40 mg by mouth 2 (two) times daily. 03/28/17 04/02/21 Yes Allie Bossier, MD  polyethylene glycol (MIRALAX / GLYCOLAX) 17 g packet Take 8.5 g by mouth daily as needed for mild constipation.   Yes [provider]  pramoxine-hydrocortisone 1-1 % foam Apply 1 g topically 4 (four) times daily as needed (itching).   Yes [provider]  spironolactone (ALDACTONE) 100 MG tablet Take 50 mg by mouth daily.   Yes [provider]  triamcinolone cream (KENALOG) 0.1 % Apply 1 application topically 2 (two) times daily as needed (itching).   Yes [provider]  aspirin EC 325 MG tablet Take 1 tablet (325 mg total) by mouth daily. Patient not taking: Reported on 04/02/2021 09/02/19   Lattie Corns, PA-C  glipiZIDE (GLUCOTROL) 5 MG tablet Take 1 tablet (5 mg total) by mouth 2 (two) times daily before a meal. Patient not taking:  Reported on 04/02/2021 01/30/16   Reyne Dumas, MD  isosorbide dinitrate (ISORDIL) 30 MG tablet 90 mg 1000 daily Patient not taking: Reported on 04/02/2021 03/28/17   Allie Bossier, MD  isosorbide dinitrate (ISORDIL) 30 MG tablet 30 mg at 1800 in the evening Patient not taking: Reported on 04/02/2021 03/28/17   Allie Bossier, MD  rosuvastatin (CRESTOR) 20 MG tablet Take 1 tablet (20 mg total) by mouth daily. Patient not taking: No sig reported 03/24/17 08/29/19  Murlean Iba, MD    Current Facility-Administered Medications  Medication Dose Route Frequency Provider Last Rate Last Admin   acetaminophen (TYLENOL) tablet 650 mg  650 mg Oral Q6H PRN Norval Morton, MD       Or   acetaminophen (TYLENOL) suppository 650 mg  650 mg Rectal Q6H PRN Fuller Plan A, MD       albuterol (PROVENTIL) (2.5 MG/3ML) 0.083% nebulizer solution 2.5 mg  2.5 mg Nebulization Q6H PRN Smith,  Rondell A, MD       dextrose 50 % solution 50 mL  1 ampule Intravenous PRN Fuller Plan A, MD   50 mL at 04/02/21 0921   insulin aspart (novoLOG) injection 0-6 Units  0-6 Units Subcutaneous TID WC Smith, Rondell A, MD       ondansetron (ZOFRAN) tablet 4 mg  4 mg Oral Q6H PRN Fuller Plan A, MD       Or   ondansetron (ZOFRAN) injection 4 mg  4 mg Intravenous Q6H PRN Smith, Rondell A, MD       sodium chloride flush (NS) 0.9 % injection 3 mL  3 mL Intravenous Q12H Smith, Rondell A, MD   3 mL at 04/02/21 0938   Current Outpatient Medications  Medication Sig Dispense Refill   acetaminophen (TYLENOL) 325 MG tablet Take 975 mg by mouth 2 (two) times daily.     apixaban (ELIQUIS) 5 MG TABS tablet Take 2.5 mg by mouth 2 (two) times daily.     carvedilol (COREG) 3.125 MG tablet Take 3.125 mg by mouth 2 (two) times daily.     cetirizine (ZYRTEC) 5 MG tablet Take 5 mg by mouth every morning.     feeding supplement, GLUCERNA SHAKE, (GLUCERNA SHAKE) LIQD Take 237 mLs by mouth 2 (two) times daily between meals.     furosemide (LASIX) 40 MG tablet Take 20 mg by mouth 2 (two) times daily.     glipiZIDE (GLUCOTROL) 10 MG tablet Take 10 mg by mouth 2 (two) times daily before a meal.     isosorbide mononitrate (IMDUR) 30 MG 24 hr tablet Take 90 mg by mouth every morning.     loratadine (CLARITIN) 10 MG tablet Take 10 mg by mouth at bedtime.     metFORMIN (GLUCOPHAGE-XR) 500 MG 24 hr tablet Take 500 mg by mouth 2 (two) times daily.     pantoprazole (PROTONIX) 40 MG tablet Take 1 tablet (40 mg total) by mouth daily. (Patient taking differently: Take 40 mg by mouth 2 (two) times daily.) 30 tablet 0   polyethylene glycol (MIRALAX / GLYCOLAX) 17 g packet Take 8.5 g by mouth daily as needed for mild constipation.     pramoxine-hydrocortisone 1-1 % foam Apply 1 g topically 4 (four) times daily as needed (itching).     spironolactone (ALDACTONE) 100 MG tablet Take 50 mg by mouth daily.     triamcinolone cream  (KENALOG) 0.1 % Apply 1 application topically 2 (two) times daily as needed (itching).  aspirin EC 325 MG tablet Take 1 tablet (325 mg total) by mouth daily. (Patient not taking: Reported on 04/02/2021) 30 tablet 0   glipiZIDE (GLUCOTROL) 5 MG tablet Take 1 tablet (5 mg total) by mouth 2 (two) times daily before a meal. (Patient not taking: Reported on 04/02/2021) 60 tablet 1   isosorbide dinitrate (ISORDIL) 30 MG tablet 90 mg 1000 daily (Patient not taking: Reported on 04/02/2021) 90 tablet 0   isosorbide dinitrate (ISORDIL) 30 MG tablet 30 mg at 1800 in the evening (Patient not taking: Reported on 04/02/2021) 30 tablet 0   rosuvastatin (CRESTOR) 20 MG tablet Take 1 tablet (20 mg total) by mouth daily. (Patient not taking: No sig reported) 30 tablet 0    Allergies as of 04/01/2021 - Review Complete 09/01/2019  Allergen Reaction Noted   Atorvastatin Other (See Comments) 06/17/2019   Metoprolol Other (See Comments) 03/19/2017   Penicillins Swelling 01/21/2016   Rosuvastatin Other (See Comments) 06/17/2019   Simvastatin Other (See Comments) 06/17/2019   Terazosin Other (See Comments) 03/19/2017    Family History  Problem Relation Age of Onset   Stroke Mother    Heart attack Mother    Stroke Father    Heart attack Father     Social History   Socioeconomic History   Marital status: Significant Other    Spouse name: Not on file   Number of children: Not on file   Years of education: Not on file   Highest education level: Not on file  Occupational History   Not on file  Tobacco Use   Smoking status: Never   Smokeless tobacco: Never  Vaping Use   Vaping Use: Never used  Substance and Sexual Activity   Alcohol use: No   Drug use: No   Sexual activity: Yes  Other Topics Concern   Not on file  Social History Narrative   Not on file   Social Determinants of Health   Financial Resource Strain: Not on file  Food Insecurity: Not on file  Transportation Needs: Not on file   Physical Activity: Not on file  Stress: Not on file  Social Connections: Not on file  Intimate Partner Violence: Not on file    Review of Systems: ROS is O/W negative except as mentioned in HPI.  Physical Exam: Vital signs in last 24 hours: Temp:  [98.6 F (37 C)-99.1 F (37.3 C)] 98.6 F (37 C) (09/27 0108) Pulse Rate:  [59-81] 64 (09/27 0800) Resp:  [16-28] 16 (09/27 0800) BP: (119-145)/(49-89) 135/68 (09/27 0800) SpO2:  [96 %-99 %] 96 % (09/27 0800)   General:  Alert, chronically ill-appearing, pleasant and cooperative in NAD; tearful Head:  Normocephalic and atraumatic. Eyes:  Scleral icterus noted. Ears:  Normal auditory acuity. Mouth:  No deformity or lesions.   Lungs:  Clear throughout to auscultation.   No wheezes, crackles, or rhonchi.  Heart:  Regular rate and rhythm; no murmurs, clicks, rubs, or gallops. Abdomen:  Soft, non-distended.  BS present.  Mid to lower abdominal TTP. Msk:  Symmetrical without gross deformities.  Pulses:  Normal pulses noted. Extremities:  Without clubbing or edema. Neurologic:  Alert and oriented x 4;  grossly normal neurologically. Skin:  Intact without significant lesions or rashes. Psych:  Alert and cooperative. Normal mood and affect.  Lab Results: Recent Labs    04/01/21 2303  WBC 10.6*  HGB 13.2  HCT 38.4*  PLT 211   BMET Recent Labs    04/01/21 2303  NA 132*  K 4.3  CL 107  CO2 19*  GLUCOSE 77  BUN 27*  CREATININE 1.16  CALCIUM 9.0   LFT Recent Labs    04/02/21 0546  PROT 6.8  ALBUMIN 1.9*  AST 138*  ALT 92*  ALKPHOS 208*  BILITOT 10.0*  BILIDIR 5.9*  IBILI 4.1*   PT/INR Recent Labs    04/01/21 2303  LABPROT 17.9*  INR 1.5*   Studies/Results: CT ABDOMEN PELVIS W CONTRAST  Result Date: 04/02/2021 CLINICAL DATA:  Abdominal distension EXAM: CT ABDOMEN AND PELVIS WITH CONTRAST TECHNIQUE: Multidetector CT imaging of the abdomen and pelvis was performed using the standard protocol following bolus  administration of intravenous contrast. CONTRAST:  3m OMNIPAQUE IOHEXOL 350 MG/ML SOLN COMPARISON:  None. FINDINGS: Lower chest: The visualized lung bases are clear. Multi-vessel coronary artery stenting has been performed. Advanced coronary artery calcification noted. Global cardiac size within normal limits. No pericardial effusion. Hepatobiliary: The liver is shrunken, the liver contour is nodular, and there is hypertrophy of the left and caudate lobes in keeping with changes of advanced cirrhosis. No enhancing intrahepatic mass is identified. No intra or extrahepatic biliary ductal dilation. There is interval thrombosis of the main and intrahepatic right and left portal veins with numerous peribiliary venous collaterals identified. Cholelithiasis without pericholecystic inflammatory change noted. Pancreas: Unremarkable Spleen: Stable splenomegaly with the spleen measuring 15.2 cm in greatest dimension. No intrasplenic lesions are seen. Adrenals/Urinary Tract: The adrenal glands are unremarkable. The kidneys are normal in position. Mild cortical scarring noted within the upper pole of the left kidney. The kidneys are otherwise unremarkable. The bladder is unremarkable. Stomach/Bowel: The stomach and small bowel are unremarkable. The appendix is absent. There is circumferential thickening of the cecum and ascending colon likely reflecting changes of portal colopathy. The colon is otherwise unremarkable. Mild ascites is present, similar to prior examination. No free intraperitoneal gas. Vascular/Lymphatic: Numerous gastroepiploic venous collaterals and a splenorenal shunt are identified. Moderate aortoiliac atherosclerotic calcification. No aortic aneurysm. No pathologic adenopathy within the abdomen and pelvis. Reproductive: Moderate prostatic enlargement again noted. Other: Tiny fat containing left inguinal hernia and small ascites containing right inguinal hernia are identified. Ventral hernia repair with mesh  has been performed. Musculoskeletal: No acute bone abnormality. Osseous structures are age-appropriate. IMPRESSION: Coronary artery calcification. Evidence of prior coronary artery stenting. Changes of advanced cirrhosis. Interval thrombosis of the portal vein with peribiliary venous collateralization. Evidence of portal venous hypertension with portosystemic collateralization, progressive, moderate splenomegaly and mild, stable ascites. Cholelithiasis. Circumferential thickening of the cecum and ascending colon likely reflecting changes of portal colopathy. Infectious or inflammatory colitis is possible, but considered less likely. No evidence of obstruction or perforation. Moderate prostatic enlargement. Aortic Atherosclerosis (ICD10-I70.0). Electronically Signed   By: AFidela SalisburyM.D.   On: 04/02/2021 02:42   DG Chest Portable 1 View  Result Date: 04/02/2021 CLINICAL DATA:  Worsening cirrhosis and abdominal distension EXAM: PORTABLE CHEST 1 VIEW COMPARISON:  Abdominal CT from earlier today FINDINGS: Low volume chest with indistinct density at the bases attributed atelectasis. Borderline heart size for technique, accentuated by mediastinal fat. Coronary stenting. No Kerley lines, effusion, or pneumothorax. IMPRESSION: Low volume chest with mild atelectasis at the bases. Electronically Signed   By: JJorje GuildM.D.   On: 04/02/2021 05:27    IMPRESSION:  *70year old male with known nonalcohol related [place] with [place] pain and elevated LFTs, particularly increasing total bilirubin at 10.8.  CT scan showed changes of cirrhosis and  cholelithiasis.  Question if these symptoms and findings are suggestive of decompensation of his cirrhosis and some acute component of PVT versus biliary obstruction.  MRCP did in fact show choledocholithiasis, but no ductal dilatation.  Viral hepatitis panel is pending.  If his numbers are representative of his liver disease then  his MELD would be 24. *Coagulopathy: INR 1.5. *Known chronic portal vein thrombosis on chronic Eliquis, but question if there is an acute component as well so ? If this could be contributing to his pain.  He has been placed on heparin drip here. *Liver lesion:  Recommend 3 to 24-monthfollow-up imaging.  PLAN: -We will plan for ERCP with Dr. JArdis Hughstomorrow afternoon at 1 PM.  I have spoken to the pharmacist and asked for them to place his heparin on hold at 7 AM for the procedure.  Perioperative antibiotics and indomethacin suppository have been ordered. -Monitor/trend labs. -We will check AFP. -He would like to try to eat so I will put him on a 2 gram sodium diet then NPO after midnight.   Jerry [name]  04/02/2021, 12:29 PM

## 2021-04-02 NOTE — Progress Notes (Addendum)
ANTICOAGULATION CONSULT NOTE - Initial Consult  Pharmacy Consult for heparin Indication:  portal vein thrombosis  Allergies  Allergen Reactions   Atorvastatin Other (See Comments)    Unknown   Metoprolol Other (See Comments)    bradycardia   Penicillins Swelling    Swelling at injection site Has patient had a PCN reaction causing immediate rash, facial/tongue/throat swelling, SOB or lightheadedness with hypotension: No Has patient had a PCN reaction causing severe rash involving mucus membranes or skin necrosis: No Has patient had a PCN reaction that required hospitalization No Has patient had a PCN reaction occurring within the last 10 years: No If all of the above answers are "NO", then may proceed with Cephalosporin use.   Rosuvastatin Other (See Comments)    unknown   Simvastatin Other (See Comments)    unknown   Terazosin Other (See Comments)    Orthostatic hypotension    Patient Measurements: Height: 5' 11"  (180.3 cm) IBW/kg (Calculated) : 75.3 Heparin Dosing Weight: 81.2 kg  Vital Signs: Temp: 98.6 F (37 C) (09/27 0108) Temp Source: Rectal (09/27 0108) BP: 135/68 (09/27 0800) Pulse Rate: 64 (09/27 0800)  Labs: Recent Labs    04/01/21 2303  HGB 13.2  HCT 38.4*  PLT 211  LABPROT 17.9*  INR 1.5*  CREATININE 1.16    CrCl cannot be calculated (Unknown ideal weight.).   Medical History: Past Medical History:  Diagnosis Date   Arthritis    Coronary artery disease    Diabetes mellitus without complication (HCC)    GERD (gastroesophageal reflux disease)    food related only with spicy only   Headache    h/o as a child-migraines   History of hiatal hernia    Hypercholesteremia    Hypertension    Liver lesion    Primary cancer of kidney or ureter    KIDNEY CANCER   PTSD (post-traumatic stress disorder)    Thrombosis    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    Medications: see MAR  Assessment: 70 yo M with CAD with history of  stent placement x2, diabetes mellitus type 2, nonalcoholic cirrhosis, 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  On PTA AC with Eliquis - compliant with last dose 9/26 morning per med rec tech.  Baseline CBC wnl   Goal of Therapy:  Heparin level 0.3-0.7 units/ml Heparin level 66-102 units/ml Monitor platelets by anticoagulation protocol: Yes   Plan: No bolus, start heparin infusion at 1400 units/hr with stop time of 9/28 at 0700 per GI for ERCP F/u 8h aPTT  Monitor daily aPTT, HL and CBC Will use aPTT as surrogate for HL due to PTA DOAC use. Will use HL as monitoring parameter once levels normalize.   Joetta Manners, PharmD, [facility] Emergency Medicine Clinical Pharmacist ED RPh Phone: [place]: [phone]

## 2021-04-02 NOTE — H&P (Addendum)
History and Physical    Jerry [name] MAY:[id number] DOB: 1950-10-09 DOA: 04/01/2021  Referring MD/NP/PA: Francia Greaves, DO PCP: Deberah Pelton, MD  Patient coming from: Home (lives alone) via EMS  Chief Complaint: Lower abdominal pain and dizziness  I have personally briefly reviewed patient's old medical records in [place]   HPI: Jerry [name] is a 70 y.o. 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  He was of his care has been at the [facility] hospital.  He notes that he has been dealing with this lower abdominal pain for quite some time and relates it to portal vein thrombosis diagnosed back in 2017.  He question if they could do a procedure to place a stent, but was told that he would not likely tolerate the procedure.  He is on Eliquis and has been taking his medications as advised.  Patient notes that he has been unable to eat much of anything as he feels bloated when he does with nausea and vomiting.  Over the last few weeks he has noticed that his skin and eyes are becoming more yellow.  His stools have also been changing in color to it chalky white appearance, and urine has become more dark.  Denies having any blood in his stools to his knowledge.  His stomach has also been distended but not to the point in which he had required paracentesis a couple months ago where they drained off to 2.6 of fluid.  At baseline patient lives at home and normally had not required any assistive devices to ambulate.  However, he recently he had been holding onto furniture's house to get from place to place and was unable to drive due to  the severity of his dizziness and has felt confused at times.  EMS had to be called today as he felt like he had no other way to get to the hospital and just feels like he is not doing well.  Denies having any significant falls, fever, shortness of breath, cough, chest pain, history of hepatitis, recent antibiotic use.  ED Course: Upon admission into the emergency department patient was seen to be afebrile, pulse 61-81, respirations 16-28, and all other vital signs maintained.  Labs start from 9/26 significant for WBC 10.6, sodium 132, BUN 27, glucose 77, alkaline phosphatase 264->208, albumin 2.2,  lipase 90, AST 149-> 138, ALT 105-> 92, total bilirubin 10.8->10, and ammonia 72.  CT scan of the abdomen pelvis with contrast noted coronary artery calcification, advanced cirrhosis with interval thrombosis of the portal vein and evidence of portal venous hypertension with splenomegaly, mild stable ascites, cholelithiasis, and circumferential thickening of the cecum along with ascending colon reflecting portal colopathy.  Urinalysis significant for elevated specific gravity of 1.036 but no signs of infection noted.  GI have been formally consulted.  Patient has been given 20 g of lactulose, Zofran, and 50 mcg of fentanyl.  Review of Systems  Constitutional:  Positive for malaise/fatigue. Negative for fever.  HENT:  Negative for congestion and nosebleeds.   Eyes:        Positive scleral icterus   Respiratory:  Negative for shortness of breath.   Cardiovascular:  Negative for chest pain and leg swelling.  Gastrointestinal:  Positive for abdominal pain, diarrhea, nausea and vomiting.  Genitourinary:  Negative for dysuria.       Positive for dark urine   Skin:  Positive for itching.  Neurological:  Positive for weakness.  Psychiatric/Behavioral:  Negative for substance abuse.    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    Past Surgical History:  Procedure Laterality Date   APPENDECTOMY     age 2   CARPAL TUNNEL RELEASE Right    CORONARY STENT INTERVENTION  03/23/2017   STENT SYNERGY DES 6.57Q46 drug eluting stent was successfully placed   CORONARY STENT INTERVENTION N/A 03/23/2017   Procedure: CORONARY STENT INTERVENTION;  Surgeon: Jettie Booze, MD;  Location: [place] [facility];  Service: Cardiovascular;  Laterality: N/A;   HAND SURGERY     LEFT HEART CATH AND CORONARY ANGIOGRAPHY N/A 03/23/2017   Procedure: LEFT HEART CATH AND CORONARY ANGIOGRAPHY;  Surgeon: Jettie Booze, MD;  Location: [place] [facility];  Service: Cardiovascular;  Laterality: N/A;   PARTIAL NEPHRECTOMY Left    PILONIDAL CYST EXCISION     REVERSE SHOULDER ARTHROPLASTY Left 09/01/2019   Procedure: REVERSE SHOULDER ARTHROPLASTY;  Surgeon: Corky Mull, MD;  Location: [facility];  Service: Orthopedics;  Laterality: Left;   SHOULDER SURGERY Left    x4   STENT PLACEMENT VASCULAR ([place] [facility])       reports that he has never smoked. He has never used smokeless tobacco. He reports that he does not drink alcohol and does not use drugs.  Allergies  Allergen Reactions   Atorvastatin Other (See Comments)    Unknown   Metoprolol Other (See Comments)    bradycardia   Penicillins Swelling    Swelling at injection site Has patient had a PCN reaction causing immediate rash, facial/tongue/throat swelling, SOB or lightheadedness with hypotension: No Has patient had a PCN reaction causing severe rash involving mucus membranes or skin necrosis: No Has patient  had a PCN reaction that required hospitalization No Has patient had a PCN reaction occurring within the last 10 years: No If all of the above answers are "NO", then may proceed with Cephalosporin use.   Rosuvastatin Other (See Comments)    unknown   Simvastatin Other (See Comments)    unknown   Terazosin Other (See Comments)    Orthostatic hypotension    Family History  Problem Relation Age of Onset   Stroke Mother    Heart attack Mother    Stroke Father    Heart attack Father     Prior to Admission medications   Medication Sig Start Date End Date Taking? Authorizing Provider  acetaminophen (TYLENOL) 325 MG tablet Take 975 mg by mouth 2 (two) times daily.     [provider]  aspirin EC 325 MG tablet Take 1 tablet (325 mg total) by mouth daily. 09/02/19   Mikle Bosworth,  Margarita Sermons, PA-C  docusate sodium (COLACE) 50 MG capsule Take 50 mg by mouth at bedtime.     [provider]  finasteride (PROSCAR) 5 MG tablet Take 5 mg by mouth every morning.     [provider]  gabapentin (NEURONTIN) 300 MG capsule Take 300 mg by mouth at bedtime.     [provider]  glipiZIDE (GLUCOTROL) 5 MG tablet Take 1 tablet (5 mg total) by mouth 2 (two) times daily before a meal. Patient taking differently: Take 5 mg by mouth See admin instructions. Take 5 mg in the morning and 2.5 mg in the evening 01/30/16   Reyne Dumas, MD  isosorbide dinitrate (ISORDIL) 30 MG tablet 90 mg 1000 daily Patient taking differently: Take 60 mg by mouth every morning.  03/28/17   Allie Bossier, MD  isosorbide dinitrate (ISORDIL) 30 MG tablet 30 mg at 1800 in the evening Patient not taking: Reported on 08/22/2019 03/28/17   Allie Bossier, MD  lisinopril (PRINIVIL,ZESTRIL) 5 MG tablet Take 2.5 mg by mouth at bedtime.     [provider]  magnesium citrate SOLN Take 296 mLs (1 Bottle total) by mouth once as needed for severe constipation. Patient not taking: Reported on 07/06/2017  01/30/16   Reyne Dumas, MD  magnesium oxide (MAG-OX) 400 MG tablet Take 400 mg by mouth every evening.     [provider]  metFORMIN (GLUCOPHAGE) 1000 MG tablet Take 500 mg by mouth 2 (two) times daily with a meal.     [provider]  nitroGLYCERIN (NITROSTAT) 0.4 MG SL tablet Place 0.4 mg under the tongue every 5 (five) minutes as needed for chest pain.    [provider]  ondansetron (ZOFRAN) 4 MG tablet Take 1 tablet (4 mg total) by mouth every 6 (six) hours as needed for nausea. 09/02/19   Lattie Corns, PA-C  oxyCODONE (OXY IR/ROXICODONE) 5 MG immediate release tablet Take 1-2 tablets (5-10 mg total) by mouth every 4 (four) hours as needed for moderate pain (pain score 4-6). 09/02/19   Lattie Corns, PA-C  pantoprazole (PROTONIX) 40 MG tablet Take 1 tablet (40 mg total) by mouth daily. Patient not taking: Reported on 08/22/2019 03/28/17 04/27/17  Allie Bossier, MD  rifaximin (XIFAXAN) 550 MG TABS tablet Take 550 mg by mouth 2 (two) times daily.    [provider]  rosuvastatin (CRESTOR) 20 MG tablet Take 1 tablet (20 mg total) by mouth daily. Patient not taking: Reported on 08/29/2019 03/24/17 08/29/19  Murlean Iba, MD  senna-docusate (SENOKOT-S) 8.6-50 MG tablet Take 1 tablet by mouth at bedtime as needed for mild constipation. 01/30/16   Reyne Dumas, MD  simethicone (MYLICON) 80 MG chewable tablet Chew 160 mg by mouth at bedtime.     [provider]  traZODone (DESYREL) 100 MG tablet Take 100 mg by mouth at bedtime.    [provider]    Physical Exam:  Constitutional: Elderly male who appears chronically ill Vitals:   04/02/21 0330 04/02/21 0445 04/02/21 0530 04/02/21 0600  BP: 131/69 128/89 119/73 122/68  Pulse: 63 67 (!) 59 (!) 59  Resp: 17 (!) 28 20 18   Temp:      TempSrc:      SpO2: 96% 99% 99% 99%   Eyes: PERRL, scleral icterus present  ENMT: Mucous membranes are moist. Posterior pharynx clear of any  exudate or lesions.  Neck: normal, supple, no masses, no thyromegaly Respiratory: clear to auscultation bilaterally,  no wheezing, no crackles. Normal respiratory effort. No accessory muscle use.  Cardiovascular: Regular rate and rhythm, no murmurs / rubs / gallops.  Trace lower extremity pitting edema. 2+ pedal pulses. No carotid bruits.  Abdomen: Protuberant abdomen with positive fluid wave present.  Bowel sounds present in all 4 quadrants. Musculoskeletal: no clubbing / cyanosis. No joint deformity upper and lower extremities. Good ROM, no contractures. Normal muscle tone.  Skin: Jaundiced skin with spider angiomas appreciated Neurologic: CN 2-12 grossly intact. Sensation intact, DTR normal. Strength 5/5 in all 4.  Psychiatric: Normal judgment and insight. Alert and oriented x 3. Normal mood.     Labs on Admission: I have personally reviewed following labs and imaging studies  CBC: Recent Labs  Lab 04/01/21 2303  WBC 10.6*  NEUTROABS 7.4  HGB 13.2  HCT 38.4*  MCV 95.5  PLT 295   Basic Metabolic Panel: Recent Labs  Lab 04/01/21 2303  NA 132*  K 4.3  CL 107  CO2 19*  GLUCOSE 77  BUN 27*  CREATININE 1.16  CALCIUM 9.0   GFR: CrCl cannot be calculated (Unknown ideal weight.). Liver Function Tests: Recent Labs  Lab 04/01/21 2303 04/02/21 0546  AST 149* 138*  ALT 105* 92*  ALKPHOS 264* 208*  BILITOT 10.8* 10.0*  PROT 7.7 6.8  ALBUMIN 2.2* 1.9*   Recent Labs  Lab 04/01/21 2303  LIPASE 90*   Recent Labs  Lab 04/02/21 0103  AMMONIA 72*   Coagulation Profile: Recent Labs  Lab 04/01/21 2303  INR 1.5*   Cardiac Enzymes: No results for input(s): CKTOTAL, CKMB, CKMBINDEX, TROPONINI in the last 168 hours. BNP (last 3 results) No results for input(s): PROBNP in the last 8760 hours. HbA1C: No results for input(s): HGBA1C in the last 72 hours. CBG: No results for input(s): GLUCAP in the last 168 hours. Lipid Profile: No results for input(s): CHOL, HDL,  LDLCALC, TRIG, CHOLHDL, LDLDIRECT in the last 72 hours. Thyroid Function Tests: No results for input(s): TSH, T4TOTAL, FREET4, T3FREE, THYROIDAB in the last 72 hours. Anemia Panel: No results for input(s): VITAMINB12, FOLATE, FERRITIN, TIBC, IRON, RETICCTPCT in the last 72 hours. Urine analysis:    Component Value Date/Time   COLORURINE AMBER (A) 04/02/2021 0546   APPEARANCEUR CLEAR 04/02/2021 0546   LABSPEC 1.036 (H) 04/02/2021 0546   PHURINE 5.0 04/02/2021 0546   GLUCOSEU NEGATIVE 04/02/2021 0546   HGBUR NEGATIVE 04/02/2021 0546   BILIRUBINUR SMALL (A) 04/02/2021 0546   KETONESUR NEGATIVE 04/02/2021 0546   PROTEINUR NEGATIVE 04/02/2021 0546   NITRITE NEGATIVE 04/02/2021 0546   LEUKOCYTESUR NEGATIVE 04/02/2021 0546   Sepsis Labs: Recent Results (from the past 240 hour(s))  Resp Panel by RT-PCR (Flu A&B, Covid) Nasopharyngeal Swab     Status: None   Collection Time: 04/02/21  5:46 AM   Specimen: Nasopharyngeal Swab; Nasopharyngeal(NP) swabs in vial transport medium  Result Value Ref Range Status   SARS Coronavirus 2 by RT PCR NEGATIVE NEGATIVE Final    Comment: (NOTE) SARS-CoV-2 target nucleic acids are NOT DETECTED.  The SARS-CoV-2 RNA is generally detectable in upper respiratory specimens during the acute phase of infection. The lowest concentration of SARS-CoV-2 viral copies this assay can detect is 138 copies/mL. A negative result does not preclude SARS-Cov-2 infection and should not be used as the sole basis for treatment or other patient management decisions. A negative result may occur with  improper specimen collection/handling, submission of specimen other than nasopharyngeal swab, presence of viral mutation(s) within the areas targeted  by this assay, and inadequate number of viral copies(<138 copies/mL). A negative result must be combined with clinical observations, patient history, and epidemiological information. The expected result is Negative.  Fact Sheet for  Patients:  EntrepreneurPulse.com.au  Fact Sheet for Healthcare Providers:  IncredibleEmployment.be  This test is no t yet approved or cleared by the [place] FDA and  has been authorized for detection and/or diagnosis of SARS-CoV-2 by FDA under an Emergency Use Authorization (EUA). This EUA will remain  in effect (meaning this test can be used) for the duration of the COVID-19 declaration under Section 564(b)(1) of the Act, 21 U.S.C.section 360bbb-3(b)(1), unless the authorization is terminated  or revoked sooner.       Influenza A by PCR NEGATIVE NEGATIVE Final   Influenza B by PCR NEGATIVE NEGATIVE Final    Comment: (NOTE) The Xpert Xpress SARS-CoV-2/FLU/RSV plus assay is intended as an aid in the diagnosis of influenza from Nasopharyngeal swab specimens and should not be used as a sole basis for treatment. Nasal washings and aspirates are unacceptable for Xpert Xpress SARS-CoV-2/FLU/RSV testing.  Fact Sheet for Patients: EntrepreneurPulse.com.au  Fact Sheet for Healthcare Providers: IncredibleEmployment.be  This test is not yet approved or cleared by the [place] FDA and has been authorized for detection and/or diagnosis of SARS-CoV-2 by FDA under an Emergency Use Authorization (EUA). This EUA will remain in effect (meaning this test can be used) for the duration of the COVID-19 declaration under Section 564(b)(1) of the Act, 21 U.S.C. section 360bbb-3(b)(1), unless the authorization is terminated or revoked.  Performed at [facility], [place] [address] [place], [place] [address]      Radiological Exams on Admission: CT ABDOMEN PELVIS W CONTRAST  Result Date: 04/02/2021 CLINICAL DATA:  Abdominal distension EXAM: CT ABDOMEN AND PELVIS WITH CONTRAST TECHNIQUE: Multidetector CT imaging of the abdomen and pelvis was performed using the standard protocol following bolus administration of  intravenous contrast. CONTRAST:  74m OMNIPAQUE IOHEXOL 350 MG/ML SOLN COMPARISON:  None. FINDINGS: Lower chest: The visualized lung bases are clear. Multi-vessel coronary artery stenting has been performed. Advanced coronary artery calcification noted. Global cardiac size within normal limits. No pericardial effusion. Hepatobiliary: The liver is shrunken, the liver contour is nodular, and there is hypertrophy of the left and caudate lobes in keeping with changes of advanced cirrhosis. No enhancing intrahepatic mass is identified. No intra or extrahepatic biliary ductal dilation. There is interval thrombosis of the main and intrahepatic right and left portal veins with numerous peribiliary venous collaterals identified. Cholelithiasis without pericholecystic inflammatory change noted. Pancreas: Unremarkable Spleen: Stable splenomegaly with the spleen measuring 15.2 cm in greatest dimension. No intrasplenic lesions are seen. Adrenals/Urinary Tract: The adrenal glands are unremarkable. The kidneys are normal in position. Mild cortical scarring noted within the upper pole of the left kidney. The kidneys are otherwise unremarkable. The bladder is unremarkable. Stomach/Bowel: The stomach and small bowel are unremarkable. The appendix is absent. There is circumferential thickening of the cecum and ascending colon likely reflecting changes of portal colopathy. The colon is otherwise unremarkable. Mild ascites is present, similar to prior examination. No free intraperitoneal gas. Vascular/Lymphatic: Numerous gastroepiploic venous collaterals and a splenorenal shunt are identified. Moderate aortoiliac atherosclerotic calcification. No aortic aneurysm. No pathologic adenopathy within the abdomen and pelvis. Reproductive: Moderate prostatic enlargement again noted. Other: Tiny fat containing left inguinal hernia and small ascites containing right inguinal hernia are identified. Ventral hernia repair with mesh has been  performed. Musculoskeletal: No acute bone abnormality.  Osseous structures are age-appropriate. IMPRESSION: Coronary artery calcification. Evidence of prior coronary artery stenting. Changes of advanced cirrhosis. Interval thrombosis of the portal vein with peribiliary venous collateralization. Evidence of portal venous hypertension with portosystemic collateralization, progressive, moderate splenomegaly and mild, stable ascites. Cholelithiasis. Circumferential thickening of the cecum and ascending colon likely reflecting changes of portal colopathy. Infectious or inflammatory colitis is possible, but considered less likely. No evidence of obstruction or perforation. Moderate prostatic enlargement. Aortic Atherosclerosis (ICD10-I70.0). Electronically Signed   By: Fidela Salisbury M.D.   On: 04/02/2021 02:42   DG Chest Portable 1 View  Result Date: 04/02/2021 CLINICAL DATA:  Worsening cirrhosis and abdominal distension EXAM: PORTABLE CHEST 1 VIEW COMPARISON:  Abdominal CT from earlier today FINDINGS: Low volume chest with indistinct density at the bases attributed atelectasis. Borderline heart size for technique, accentuated by mediastinal fat. Coronary stenting. No Kerley lines, effusion, or pneumothorax. IMPRESSION: Low volume chest with mild atelectasis at the bases. Electronically Signed   By: Jorje Guild M.D.   On: 04/02/2021 05:27    Chest x-ray: Independently reviewed. Low lung volumes  Assessment/Plan Jaundice choledocholithiasis transaminitis and hyperbilirubinemia: Acute on chronic.  Patient presents with complaints of abdominal pain and was found to have elevated liver enzymes AST 149-> 138, ALT 105-> 92, total bilirubin 10.8->10.  Review of records shows bilirubin had been intermittently elevated earlier this year, but had been continuing to rise since July.  Question possibility of obstructive jaundice 2/2 choledocholithiasis as the cause of progressively increasing bilirubin. -Admit to  medical telemetry -N.p.o. after midnight for likely need of ERCP in am   -Follow-up hepatitis panel -Check MRCP -Appreciate GI consultative services, will follow-up for further recommended  Dizziness: Patient reports feeling lightheaded.  Suspect likely related to him being dehydrated with poor p.o. intake. -Check orthostatic vital sign -Normal saline IV fluids for 1 L -Consider resuming diuretics tomorrow unless gastroenterology recommends otherwise  Leukocytosis: Acute.  WBC elevated 10.6.  Chest x-ray noted low lung volumes with likely atelectasis and urinalysis did not show signs of infection.  Denies having any significant fever or chills. -Recheck CBC tomorrow morning  Portal vein thrombosis: Chronic.  Patient noted to have collaterals on CT imaging, both initially diagnosed back in 2017. 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  MELD= 24 with 19.6% 46-monthestimated mortality.  Home medications include Coreg 3.125 mg twice daily, furosemide 20 mg twice daily, and spironolactone 50 mg daily. -Continue beta-blocker and hold diuretics til tomorrow -Follow-up acute hepatitis -Continue current home regimen as tolerated   Acute hepatic encephalopathy: Acute.  Patient reports that he has been intermittently confused.  Ammonia level noted to be 72 on admission.  Patient had been given lactulose 20 g p.o. patient appears to have had 3-4 stools since being here in the hospital -Neuro check -Evaluate for need of continuation of large  Diabetes mellitus type 2: On admission blood glucose was noted to be 77.   -Hypoglycemic protocols -Check hemoglobin A1c -Held metformin and glipizide -CBGs before every meal with very sensitive SSI  Essential hypertension: Blood pressures currently maintained.  Home  medications include Coreg 3.125 mg twice daily. -Continue Coreg as tolerated  Hyperlipidemia: Patient previously had been on Crestor, but unclear when this was discontinued as [facility]  Hypoalbuminemia: Albumin noted to be as low  as 1.9 on admission. -Check prealbumin in a.m.  BPH: As noted on CT scan patient was noted to have moderate prostatic enlargement.  Patient appears not to be currently on any medication for treatment.  GERD -Continue Protonix  DVT prophylaxis: Heparin per pharmacy Code Status: Full Family Communication: None requested Disposition Plan: To be due to determined Consults called: Gastroenterology Admission status: Inpatient, require more than 2 midnight stay  Norval Morton MD [facility]   If 7PM-7AM, please contact night-coverage   04/02/2021, 8:07 AM

## 2021-04-02 NOTE — ED Provider Notes (Signed)
[name] EMERGENCY DEPARTMENT Provider Note   CSN: [id number] Arrival date & time: 04/01/21  2220     History Chief Complaint  Patient presents with   Abdominal Pain    Jerry [name] is a 70 y.o. 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  The patient reports a longstanding history of abdominal pain due to a portal vein thrombosis.  He is on apixaban and has been compliant with the medication.  However, he reports that over the last few days that he has developed constant, worsening bilateral lower abdominal pain.  He cannot characterize the pain.  No known aggravating or alleviating factors.  He reports that for the last 4 to 5 months that his significant other has commented on worsening yellowish discoloration to his skin and eyes.  The patient states that he has been unable to tell if this has been changing when he looks in the mirror.  He does also note that his stools have been chalky white most of the time, but will intermittently be green.  No recent diarrhea.  No vomiting.  He also notes that his urine has been more dark and sometimes red or orange, especially over the last week.  Reports that he had 2.6 L of fluid drained during the paracentesis approximately 2 to 3 months ago.  Level 5 caveat secondary to confusion.  The history is provided by the patient and medical records. No language interpreter was used.      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    Patient Active Problem List   Diagnosis Date Noted   Transaminitis 04/02/2021   Status post reverse arthroplasty of shoulder, 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   Chest pain due to CAD ([place]) 03/27/2017   Upper GI bleed    S/P coronary artery stent placement    Coronary artery disease involving native coronary artery of native heart with angina pectoris ([place])    Chest pain 03/19/2017   Other cirrhosis of liver ([place]) 03/19/2017   Portal vein thrombosis 01/21/2016   Type 2 diabetes mellitus without complication, without long-term current use of insulin ([place]) 01/21/2016   Coronary artery disease due to lipid rich plaque 01/21/2016   Essential hypertension     Past Surgical History:  Procedure Laterality Date   APPENDECTOMY     age 46   CARPAL TUNNEL RELEASE Right    CORONARY STENT INTERVENTION  03/23/2017   STENT SYNERGY DES 8.27M78 drug eluting stent was successfully placed   CORONARY STENT INTERVENTION N/A 03/23/2017   Procedure: CORONARY STENT INTERVENTION;  Surgeon: Jettie Booze, MD;  Location: [facility];  Service: Cardiovascular;  Laterality: N/A;   HAND SURGERY     LEFT HEART CATH AND CORONARY ANGIOGRAPHY N/A 03/23/2017   Procedure: LEFT HEART CATH AND CORONARY ANGIOGRAPHY;  Surgeon: Jettie Booze, MD;  Location: [facility];  Service: Cardiovascular;  Laterality: N/A;   PARTIAL NEPHRECTOMY Left    PILONIDAL CYST EXCISION     REVERSE SHOULDER ARTHROPLASTY Left 09/01/2019   Procedure: REVERSE SHOULDER ARTHROPLASTY;  Surgeon: Corky Mull, MD;  Location: [facility];  Service: Orthopedics;  Laterality: Left;   SHOULDER SURGERY Left    x4   STENT PLACEMENT VASCULAR ([facility] HX)         Family  History  Problem Relation Age of Onset   Stroke Mother    Heart attack Mother    Stroke Father    Heart attack Father     Social History   Tobacco Use   Smoking status: Never   Smokeless tobacco: Never  Vaping Use   Vaping Use: Never used  Substance Use Topics   Alcohol use: No   Drug use: No    Home Medications Prior to Admission medications   Medication Sig Start Date End Date Taking? Authorizing Provider  acetaminophen (TYLENOL) 325 MG tablet Take 975 mg by mouth 2 (two) times daily.     [provider]  aspirin EC 325 MG tablet Take 1 tablet (325 mg total) by mouth daily. 09/02/19   Lattie Corns, PA-C  docusate sodium (COLACE) 50 MG capsule Take 50 mg by mouth at bedtime.     [provider]  finasteride (PROSCAR) 5 MG tablet Take 5 mg by mouth every morning.     [provider]  gabapentin (NEURONTIN) 300 MG capsule Take 300 mg by mouth at bedtime.     [provider]  glipiZIDE (GLUCOTROL) 5 MG tablet Take 1 tablet (5 mg total) by mouth 2 (two) times daily before a meal. Patient taking differently: Take 5 mg by mouth See admin instructions. Take 5 mg in the morning and 2.5 mg in the evening 01/30/16   Reyne Dumas, MD  isosorbide dinitrate (ISORDIL) 30 MG tablet 90 mg 1000 daily Patient taking differently: Take 60 mg by mouth every morning.  03/28/17   Allie Bossier, MD  isosorbide dinitrate (ISORDIL) 30 MG tablet 30 mg at 1800 in the evening Patient not taking: Reported on 08/22/2019 03/28/17   Allie Bossier, MD  lisinopril (PRINIVIL,ZESTRIL) 5 MG tablet Take 2.5 mg by mouth at bedtime.     [provider]  magnesium citrate SOLN Take 296 mLs (1 Bottle total) by mouth once as needed for severe constipation. Patient not taking: Reported on 07/06/2017 01/30/16   Reyne Dumas, MD  magnesium oxide (MAG-OX) 400 MG tablet Take 400 mg by mouth every evening.     [provider]  metFORMIN (GLUCOPHAGE) 1000 MG tablet  Take 500 mg by mouth 2 (two) times daily with a meal.     [provider]  nitroGLYCERIN (NITROSTAT) 0.4 MG SL tablet Place 0.4 mg under the tongue every 5 (five) minutes as needed for chest pain.    [provider]  ondansetron (ZOFRAN) 4 MG tablet Take 1 tablet (4 mg total) by mouth every 6 (six) hours as needed for nausea. 09/02/19   Lattie Corns, PA-C  oxyCODONE (OXY IR/ROXICODONE) 5 MG immediate release tablet Take 1-2 tablets (5-10 mg total) by mouth every 4 (four) hours as needed for moderate pain (pain score  4-6). 09/02/19   Lattie Corns, PA-C  pantoprazole (PROTONIX) 40 MG tablet Take 1 tablet (40 mg total) by mouth daily. Patient not taking: Reported on 08/22/2019 03/28/17 04/27/17  Allie Bossier, MD  rifaximin (XIFAXAN) 550 MG TABS tablet Take 550 mg by mouth 2 (two) times daily.    [provider]  rosuvastatin (CRESTOR) 20 MG tablet Take 1 tablet (20 mg total) by mouth daily. Patient not taking: Reported on 08/29/2019 03/24/17 08/29/19  Murlean Iba, MD  senna-docusate (SENOKOT-S) 8.6-50 MG tablet Take 1 tablet by mouth at bedtime as needed for mild constipation. 01/30/16   Reyne Dumas, MD  simethicone (MYLICON) 80 MG chewable tablet Chew 160 mg by mouth at bedtime.     [provider]  traZODone (DESYREL) 100 MG tablet Take 100 mg by mouth at bedtime.    [provider]    Allergies    Atorvastatin, Metoprolol, Penicillins, Rosuvastatin, Simvastatin, and Terazosin  Review of Systems   Review of Systems  Unable to perform ROS: Mental status change  Constitutional:  Negative for fever.  Gastrointestinal:  Positive for abdominal distention, abdominal pain and nausea. Negative for blood in stool and vomiting.  Skin:  Positive for color change.       Pruritus  Neurological:  Positive for dizziness and light-headedness.   Physical Exam Updated Vital Signs BP 122/68   Pulse (!) 59   Temp 98.6 F (37 C) (Rectal)    Resp 18   SpO2 99%   Physical Exam Vitals and nursing note reviewed.  Constitutional:      Appearance: He is well-developed.  HENT:     Head: Normocephalic.  Eyes:     Conjunctiva/sclera: Conjunctivae normal.  Cardiovascular:     Rate and Rhythm: Normal rate and regular rhythm.     Heart sounds: No murmur heard.   No friction rub. No gallop.  Pulmonary:     Effort: Pulmonary effort is normal. No respiratory distress.     Breath sounds: No stridor. No wheezing, rhonchi or rales.  Chest:     Chest wall: No tenderness.  Abdominal:     General: There is distension.     Palpations: Abdomen is soft. There is no mass.     Tenderness: There is abdominal tenderness. There is no guarding or rebound.     Hernia: No hernia is present.     Comments: Abdomen is moderately distended, but soft.  Negative fluid wave.  Hyperactive bowel sounds in all 4 quadrants.  Tender to palpation in the bilateral lower abdomen.  No focal tenderness of McBurney's point.  Negative Murphy sign.  Musculoskeletal:     Cervical back: Neck supple.     Right lower leg: No edema.     Left lower leg: No edema.  Skin:    General: Skin is warm and dry.     Capillary Refill: Capillary refill takes less than 2 seconds.     Coloration: Skin is jaundiced.  Neurological:     Mental Status: He is alert.     Comments: Oriented to self.  Knows the year is 2022.  Knows the president.  Believes the month to be October.  Knows that he is at a hospital.  However, he has confusion about other details, especially events over the last few days, recalling specific facts, such as his outpatient care team at home medications.  GCS 14.  Cranial nerves II through XII are grossly intact.  Good strength against resistance to  the upper and lower extremities.  Sensations intact and equal throughout.  Psychiatric:        Behavior: Behavior normal.    ED Results / Procedures / Treatments   Labs (all labs ordered are listed, but only abnormal  results are displayed) Labs Reviewed  CBC WITH DIFFERENTIAL/PLATELET - Abnormal; Notable for the following components:      Result Value   WBC 10.6 (*)    RBC 4.02 (*)    HCT 38.4 (*)    RDW 16.7 (*)    Abs Immature Granulocytes 0.18 (*)    All other components within normal limits  COMPREHENSIVE METABOLIC PANEL - Abnormal; Notable for the following components:   Sodium 132 (*)    CO2 19 (*)    BUN 27 (*)    Albumin 2.2 (*)    AST 149 (*)    ALT 105 (*)    Alkaline Phosphatase 264 (*)    Total Bilirubin 10.8 (*)    All other components within normal limits  LIPASE, BLOOD - Abnormal; Notable for the following components:   Lipase 90 (*)    All other components within normal limits  URINALYSIS, ROUTINE W REFLEX MICROSCOPIC - Abnormal; Notable for the following components:   Color, Urine AMBER (*)    Specific Gravity, Urine 1.036 (*)    Bilirubin Urine SMALL (*)    All other components within normal limits  PROTIME-INR - Abnormal; Notable for the following components:   Prothrombin Time 17.9 (*)    INR 1.5 (*)    All other components within normal limits  AMMONIA - Abnormal; Notable for the following components:   Ammonia 72 (*)    All other components within normal limits  HEPATIC FUNCTION PANEL - Abnormal; Notable for the following components:   Albumin 1.9 (*)    AST 138 (*)    ALT 92 (*)    Alkaline Phosphatase 208 (*)    Total Bilirubin 10.0 (*)    Bilirubin, Direct 5.9 (*)    Indirect Bilirubin 4.1 (*)    All other components within normal limits  RESP PANEL BY RT-PCR (FLU A&B, COVID) ARPGX2  URINE CULTURE  HEPATITIS PANEL, ACUTE    EKG None  Radiology CT ABDOMEN PELVIS W CONTRAST  Result Date: 04/02/2021 CLINICAL DATA:  Abdominal distension EXAM: CT ABDOMEN AND PELVIS WITH CONTRAST TECHNIQUE: Multidetector CT imaging of the abdomen and pelvis was performed using the standard protocol following bolus administration of intravenous contrast. CONTRAST:  7m  OMNIPAQUE IOHEXOL 350 MG/ML SOLN COMPARISON:  None. FINDINGS: Lower chest: The visualized lung bases are clear. Multi-vessel coronary artery stenting has been performed. Advanced coronary artery calcification noted. Global cardiac size within normal limits. No pericardial effusion. Hepatobiliary: The liver is shrunken, the liver contour is nodular, and there is hypertrophy of the left and caudate lobes in keeping with changes of advanced cirrhosis. No enhancing intrahepatic mass is identified. No intra or extrahepatic biliary ductal dilation. There is interval thrombosis of the main and intrahepatic right and left portal veins with numerous peribiliary venous collaterals identified. Cholelithiasis without pericholecystic inflammatory change noted. Pancreas: Unremarkable Spleen: Stable splenomegaly with the spleen measuring 15.2 cm in greatest dimension. No intrasplenic lesions are seen. Adrenals/Urinary Tract: The adrenal glands are unremarkable. The kidneys are normal in position. Mild cortical scarring noted within the upper pole of the left kidney. The kidneys are otherwise unremarkable. The bladder is unremarkable. Stomach/Bowel: The stomach and small bowel are unremarkable. The appendix is absent. There  is circumferential thickening of the cecum and ascending colon likely reflecting changes of portal colopathy. The colon is otherwise unremarkable. Mild ascites is present, similar to prior examination. No free intraperitoneal gas. Vascular/Lymphatic: Numerous gastroepiploic venous collaterals and a splenorenal shunt are identified. Moderate aortoiliac atherosclerotic calcification. No aortic aneurysm. No pathologic adenopathy within the abdomen and pelvis. Reproductive: Moderate prostatic enlargement again noted. Other: Tiny fat containing left inguinal hernia and small ascites containing right inguinal hernia are identified. Ventral hernia repair with mesh has been performed. Musculoskeletal: No acute bone  abnormality. Osseous structures are age-appropriate. IMPRESSION: Coronary artery calcification. Evidence of prior coronary artery stenting. Changes of advanced cirrhosis. Interval thrombosis of the portal vein with peribiliary venous collateralization. Evidence of portal venous hypertension with portosystemic collateralization, progressive, moderate splenomegaly and mild, stable ascites. Cholelithiasis. Circumferential thickening of the cecum and ascending colon likely reflecting changes of portal colopathy. Infectious or inflammatory colitis is possible, but considered less likely. No evidence of obstruction or perforation. Moderate prostatic enlargement. Aortic Atherosclerosis (ICD10-I70.0). Electronically Signed   By: Fidela Salisbury M.D.   On: 04/02/2021 02:42   DG Chest Portable 1 View  Result Date: 04/02/2021 CLINICAL DATA:  Worsening cirrhosis and abdominal distension EXAM: PORTABLE CHEST 1 VIEW COMPARISON:  Abdominal CT from earlier today FINDINGS: Low volume chest with indistinct density at the bases attributed atelectasis. Borderline heart size for technique, accentuated by mediastinal fat. Coronary stenting. No Kerley lines, effusion, or pneumothorax. IMPRESSION: Low volume chest with mild atelectasis at the bases. Electronically Signed   By: Jorje Guild M.D.   On: 04/02/2021 05:27    Procedures Procedures   Medications Ordered in ED Medications  ondansetron (ZOFRAN) injection 4 mg (4 mg Intravenous Given 04/02/21 0129)  fentaNYL (SUBLIMAZE) injection 50 mcg (50 mcg Intravenous Given 04/02/21 0130)  iohexol (OMNIPAQUE) 350 MG/ML injection 80 mL (80 mLs Intravenous Contrast Given 04/02/21 0222)  lactulose (CHRONULAC) 10 GM/15ML solution 20 g (20 g Oral Given 04/02/21 1700)    ED Course  I have reviewed the triage vital signs and the nursing notes.  Pertinent labs & imaging results that were available during my care of the patient were reviewed by me and considered in my medical decision  making (see chart for details).    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  Patient reports that he has been dizzy and lightheaded for several years, but reports that this is also worsened over the last few days.  He has been having nausea, but no vomiting.  In the ED, he is confused.  Vital signs are stable.  Labs and imaging been reviewed and independently interpreted by me.  Labs are notable for elevated hepatic function.  Total bilirubin is 10.8. (up from 1.6 in 2021), AST and ALT 149 and 105, respectively, (up from 59 and 55).  Lipase is also minimally elevated at 98.  INR is also 1.14 today.  Regarding confusion, ammonia is also noted to be 72.  He does have a borderline elevated leukocytosis at 10.6.  No left shift. MELD score is 24.   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  Otherwise no significant findings.  I did discuss these results with Dr. Dina Rich, attending physician, which can be a common cause for GI bleed and advanced cirrhosis, but patient is having no signs or symptoms of bleeding at this time.  Given the patient's complicated medical history, GI was consulted.  Spoke with Dr. Lorenso Courier.  She recommends performing a bedside ultrasound to see if there is a pocket of fluid amenable to paracentesis.  Both myself and Dr. Dina Rich individually attempted bedside ultrasound of the abdomen with no fluid collection amenable to paracentesis.  Dr. Lorenso Courier also recommends chest x-ray, urinalysis (which is pending), urine culture, hepatitis panel.  We will also add on hepatic function panel  for direct and indirect bilirubin.  He has been given lactulose for hyperammonemia, which she is also in agreement with.  I did discuss concern for choledocholithiasis giving worsening abdominal pain, but most of the patient's abdominal pain is lower.  There is no evidence of obstruction.  Doubt GI bleed.  No diarrhea.  We did discuss ongoing antibiotics since the patient did have a borderline leukocytosis and was having worsening abdominal pain with history of previous paracentesis.  However, she recommends holding antibiotics at this time.  GI will follow the patient as a consult.  Attempt to obtain the patient's GI records from the [facility] has been requested by ER Network engineer.  On reevaluation, he reports that pain is somewhat improved from arrival.  He continues to have intermittent confusion.  He is in agreement with the work-up and plan.  Consult to the hospitalist team and Dr. Nevada Crane will accept the patient for admission. The patient appears reasonably stabilized for admission considering the current resources, flow, and capabilities available in the ED at this time, and I doubt any other [facility] requiring further screening and/or treatment in the ED prior to admission.  Final Clinical Impression(s) / ED Diagnoses Final diagnoses:  Encephalopathy  Hyperammonemia ([place])  Elevated transaminase level    Rx / DC Orders ED Discharge Orders     None        Joanne Gavel, PA-C 04/02/21 0805    Merryl Hacker, MD 04/06/21 [phone]

## 2021-04-02 NOTE — ED Notes (Signed)
Pt encouraged to attempt to provide urine specimen.

## 2021-04-02 NOTE — H&P (View-Only) (Signed)
Referring Provider:  EDP Primary Care Physician:  Deberah Pelton, MD Primary Gastroenterologist:  Althia Forts  Reason for Consultation:  Elevated LFTs and abdominal pain  HPI: Jerry [name] is a 70 y.o. male with medical history significant of CAD with history of stent placement x2, diabetes mellitus type 2, nonalcoholic cirrhosis, portal vein thrombosis of the ileal vein and nonocclusive thrombus of the SMV and main portal veins, ascites status post paracentesis, kidney cancer, inguinal hernia and history of GI bleed who follows at the [place]. he presented to [facility] on 9/26 with complaints of diffuse abdominal pain and yellowing of the skin.  He tells me that his girlfriend is noted yellowing of his skin over the past couple of months.  He tells me he has chronic abdominal pain due to the portal vein thrombosis that was diagnosed 5 years ago, but his pain has acutely worsened over the course of the past several weeks.  He denies confusion, but has been unsteady and needing to use furniture to navigate.  Says he is trying to get a cane.  He is tearful and says he has no quality of life.  He says that he knows that he needs to eat, but when he tries to eat he becomes nauseous and sick feeling.  Upon presentation here he was found to have an increase in his LFTs with total bilirubin up to 10.8, alk phos to 264, ALT 105, AST 149.  Viral hepatitis panel is negative.  INR 1.5.  CT of the abdomen and pelvis with contrast showed the following:  IMPRESSION: Coronary artery calcification. Evidence of prior coronary artery stenting.   Changes of advanced cirrhosis. Interval thrombosis of the portal vein with peribiliary venous collateralization. Evidence of portal venous hypertension with portosystemic collateralization, progressive, moderate splenomegaly and mild, stable ascites.   Cholelithiasis.   Circumferential thickening of the cecum and ascending colon likely reflecting changes of  portal colopathy. Infectious or inflammatory colitis is possible, but considered less likely. No evidence of obstruction or perforation.   Moderate prostatic enlargement.   Aortic Atherosclerosis (ICD10-I70.0).  MRI of the abdomen/MRCP showed the following:  IMPRESSION: Portal venous thrombosis worse than in 2017 and with potential early cavernous transformation suggests there may be some chronicity. Acute on chronic thrombus is considered.   Edema about the RIGHT colon, correlate with any symptoms that would suggest portal colopathy exacerbated by portal thrombosis. 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   Cholelithiasis with choledocholithiasis but without biliary duct dilation. Correlation with symptoms may be helpful.   Signs of hepatic cirrhosis with splenomegaly and small volume ascites as on the previous CT.   Small foci of hyperenhancement near the dome of the RIGHT hemi liver 7 mm. In aggregate these are LR category 3. Three to six-month follow-up is suggested for further evaluation.   Small volume ascites about the RIGHT hemi liver.    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    Past Surgical History:  Procedure Laterality Date   APPENDECTOMY     age 84   CARPAL TUNNEL RELEASE Right    CORONARY STENT INTERVENTION  03/23/2017   STENT SYNERGY DES 2.69S85 drug eluting stent was successfully placed   CORONARY STENT INTERVENTION N/A 03/23/2017    Procedure: CORONARY STENT INTERVENTION;  Surgeon: Jettie Booze, MD;  Location: [facility];  Service: Cardiovascular;  Laterality: N/A;   HAND SURGERY     LEFT HEART CATH AND CORONARY ANGIOGRAPHY N/A 03/23/2017   Procedure: LEFT HEART CATH AND CORONARY ANGIOGRAPHY;  Surgeon: Jettie Booze, MD;  Location: [place] [facility];  Service: Cardiovascular;  Laterality: N/A;   PARTIAL NEPHRECTOMY Left    PILONIDAL CYST EXCISION     REVERSE SHOULDER ARTHROPLASTY Left 09/01/2019   Procedure: REVERSE SHOULDER ARTHROPLASTY;  Surgeon: Corky Mull, MD;  Location: [facility];  Service: Orthopedics;  Laterality: Left;   SHOULDER SURGERY Left    x4   STENT PLACEMENT VASCULAR ([place] HX)      Prior to Admission medications   Medication Sig Start Date End Date Taking? Authorizing Provider  acetaminophen (TYLENOL) 325 MG tablet Take 975 mg by mouth 2 (two) times daily.   Yes [provider]  apixaban (ELIQUIS) 5 MG TABS tablet Take 2.5 mg by mouth 2 (two) times daily. 02/26/21  Yes [provider]  carvedilol (COREG) 3.125 MG tablet Take 3.125 mg by mouth 2 (two) times daily. 10/31/20  Yes [provider]  cetirizine (ZYRTEC) 5 MG tablet Take 5 mg by mouth every morning. 03/06/21  Yes [provider]  feeding supplement, GLUCERNA SHAKE, (GLUCERNA SHAKE) LIQD Take 237 mLs by mouth 2 (two) times daily between meals.   Yes [provider]  furosemide (LASIX) 40 MG tablet Take 20 mg by mouth 2 (two) times daily.   Yes [provider]  glipiZIDE (GLUCOTROL) 10 MG tablet Take 10 mg by mouth 2 (two) times daily before a meal.   Yes [provider]  isosorbide mononitrate (IMDUR) 30 MG 24 hr tablet Take 90 mg by mouth every morning. 03/05/21  Yes [provider]  loratadine (CLARITIN) 10 MG tablet Take 10 mg by mouth at bedtime. 03/06/21  Yes [provider]  metFORMIN (GLUCOPHAGE-XR) 500 MG 24 hr tablet Take 500 mg by mouth 2  (two) times daily. 01/01/21  Yes [provider]  pantoprazole (PROTONIX) 40 MG tablet Take 1 tablet (40 mg total) by mouth daily. Patient taking differently: Take 40 mg by mouth 2 (two) times daily. 03/28/17 04/02/21 Yes Allie Bossier, MD  polyethylene glycol (MIRALAX / GLYCOLAX) 17 g packet Take 8.5 g by mouth daily as needed for mild constipation.   Yes [provider]  pramoxine-hydrocortisone 1-1 % foam Apply 1 g topically 4 (four) times daily as needed (itching).   Yes [provider]  spironolactone (ALDACTONE) 100 MG tablet Take 50 mg by mouth daily.   Yes [provider]  triamcinolone cream (KENALOG) 0.1 % Apply 1 application topically 2 (two) times daily as needed (itching).   Yes [provider]  aspirin EC 325 MG tablet Take 1 tablet (325 mg total) by mouth daily. Patient not taking: Reported on 04/02/2021 09/02/19   Lattie Corns, PA-C  glipiZIDE (GLUCOTROL) 5 MG tablet Take 1 tablet (5 mg total) by mouth 2 (two) times daily before a meal. Patient not taking:  Reported on 04/02/2021 01/30/16   Reyne Dumas, MD  isosorbide dinitrate (ISORDIL) 30 MG tablet 90 mg 1000 daily Patient not taking: Reported on 04/02/2021 03/28/17   Allie Bossier, MD  isosorbide dinitrate (ISORDIL) 30 MG tablet 30 mg at 1800 in the evening Patient not taking: Reported on 04/02/2021 03/28/17   Allie Bossier, MD  rosuvastatin (CRESTOR) 20 MG tablet Take 1 tablet (20 mg total) by mouth daily. Patient not taking: No sig reported 03/24/17 08/29/19  Murlean Iba, MD    Current Facility-Administered Medications  Medication Dose Route Frequency Provider Last Rate Last Admin   acetaminophen (TYLENOL) tablet 650 mg  650 mg Oral Q6H PRN Norval Morton, MD       Or   acetaminophen (TYLENOL) suppository 650 mg  650 mg Rectal Q6H PRN Fuller Plan A, MD       albuterol (PROVENTIL) (2.5 MG/3ML) 0.083% nebulizer solution 2.5 mg  2.5 mg Nebulization Q6H PRN Smith,  Rondell A, MD       dextrose 50 % solution 50 mL  1 ampule Intravenous PRN Fuller Plan A, MD   50 mL at 04/02/21 0921   insulin aspart (novoLOG) injection 0-6 Units  0-6 Units Subcutaneous TID WC Smith, Rondell A, MD       ondansetron (ZOFRAN) tablet 4 mg  4 mg Oral Q6H PRN Fuller Plan A, MD       Or   ondansetron (ZOFRAN) injection 4 mg  4 mg Intravenous Q6H PRN Smith, Rondell A, MD       sodium chloride flush (NS) 0.9 % injection 3 mL  3 mL Intravenous Q12H Smith, Rondell A, MD   3 mL at 04/02/21 0938   Current Outpatient Medications  Medication Sig Dispense Refill   acetaminophen (TYLENOL) 325 MG tablet Take 975 mg by mouth 2 (two) times daily.     apixaban (ELIQUIS) 5 MG TABS tablet Take 2.5 mg by mouth 2 (two) times daily.     carvedilol (COREG) 3.125 MG tablet Take 3.125 mg by mouth 2 (two) times daily.     cetirizine (ZYRTEC) 5 MG tablet Take 5 mg by mouth every morning.     feeding supplement, GLUCERNA SHAKE, (GLUCERNA SHAKE) LIQD Take 237 mLs by mouth 2 (two) times daily between meals.     furosemide (LASIX) 40 MG tablet Take 20 mg by mouth 2 (two) times daily.     glipiZIDE (GLUCOTROL) 10 MG tablet Take 10 mg by mouth 2 (two) times daily before a meal.     isosorbide mononitrate (IMDUR) 30 MG 24 hr tablet Take 90 mg by mouth every morning.     loratadine (CLARITIN) 10 MG tablet Take 10 mg by mouth at bedtime.     metFORMIN (GLUCOPHAGE-XR) 500 MG 24 hr tablet Take 500 mg by mouth 2 (two) times daily.     pantoprazole (PROTONIX) 40 MG tablet Take 1 tablet (40 mg total) by mouth daily. (Patient taking differently: Take 40 mg by mouth 2 (two) times daily.) 30 tablet 0   polyethylene glycol (MIRALAX / GLYCOLAX) 17 g packet Take 8.5 g by mouth daily as needed for mild constipation.     pramoxine-hydrocortisone 1-1 % foam Apply 1 g topically 4 (four) times daily as needed (itching).     spironolactone (ALDACTONE) 100 MG tablet Take 50 mg by mouth daily.     triamcinolone cream  (KENALOG) 0.1 % Apply 1 application topically 2 (two) times daily as needed (itching).  aspirin EC 325 MG tablet Take 1 tablet (325 mg total) by mouth daily. (Patient not taking: Reported on 04/02/2021) 30 tablet 0   glipiZIDE (GLUCOTROL) 5 MG tablet Take 1 tablet (5 mg total) by mouth 2 (two) times daily before a meal. (Patient not taking: Reported on 04/02/2021) 60 tablet 1   isosorbide dinitrate (ISORDIL) 30 MG tablet 90 mg 1000 daily (Patient not taking: Reported on 04/02/2021) 90 tablet 0   isosorbide dinitrate (ISORDIL) 30 MG tablet 30 mg at 1800 in the evening (Patient not taking: Reported on 04/02/2021) 30 tablet 0   rosuvastatin (CRESTOR) 20 MG tablet Take 1 tablet (20 mg total) by mouth daily. (Patient not taking: No sig reported) 30 tablet 0    Allergies as of 04/01/2021 - Review Complete 09/01/2019  Allergen Reaction Noted   Atorvastatin Other (See Comments) 06/17/2019   Metoprolol Other (See Comments) 03/19/2017   Penicillins Swelling 01/21/2016   Rosuvastatin Other (See Comments) 06/17/2019   Simvastatin Other (See Comments) 06/17/2019   Terazosin Other (See Comments) 03/19/2017    Family History  Problem Relation Age of Onset   Stroke Mother    Heart attack Mother    Stroke Father    Heart attack Father     Social History   Socioeconomic History   Marital status: Significant Other    Spouse name: Not on file   Number of children: Not on file   Years of education: Not on file   Highest education level: Not on file  Occupational History   Not on file  Tobacco Use   Smoking status: Never   Smokeless tobacco: Never  Vaping Use   Vaping Use: Never used  Substance and Sexual Activity   Alcohol use: No   Drug use: No   Sexual activity: Yes  Other Topics Concern   Not on file  Social History Narrative   Not on file   Social Determinants of Health   Financial Resource Strain: Not on file  Food Insecurity: Not on file  Transportation Needs: Not on file   Physical Activity: Not on file  Stress: Not on file  Social Connections: Not on file  Intimate Partner Violence: Not on file    Review of Systems: ROS is O/W negative except as mentioned in HPI.  Physical Exam: Vital signs in last 24 hours: Temp:  [98.6 F (37 C)-99.1 F (37.3 C)] 98.6 F (37 C) (09/27 0108) Pulse Rate:  [59-81] 64 (09/27 0800) Resp:  [16-28] 16 (09/27 0800) BP: (119-145)/(49-89) 135/68 (09/27 0800) SpO2:  [96 %-99 %] 96 % (09/27 0800)   General:  Alert, chronically ill-appearing, pleasant and cooperative in NAD; tearful Head:  Normocephalic and atraumatic. Eyes:  Scleral icterus noted. Ears:  Normal auditory acuity. Mouth:  No deformity or lesions.   Lungs:  Clear throughout to auscultation.   No wheezes, crackles, or rhonchi.  Heart:  Regular rate and rhythm; no murmurs, clicks, rubs, or gallops. Abdomen:  Soft, non-distended.  BS present.  Mid to lower abdominal TTP. Msk:  Symmetrical without gross deformities.  Pulses:  Normal pulses noted. Extremities:  Without clubbing or edema. Neurologic:  Alert and oriented x 4;  grossly normal neurologically. Skin:  Intact without significant lesions or rashes. Psych:  Alert and cooperative. Normal mood and affect.  Lab Results: Recent Labs    04/01/21 2303  WBC 10.6*  HGB 13.2  HCT 38.4*  PLT 211   BMET Recent Labs    04/01/21 2303  NA 132*  K 4.3  CL 107  CO2 19*  GLUCOSE 77  BUN 27*  CREATININE 1.16  CALCIUM 9.0   LFT Recent Labs    04/02/21 0546  PROT 6.8  ALBUMIN 1.9*  AST 138*  ALT 92*  ALKPHOS 208*  BILITOT 10.0*  BILIDIR 5.9*  IBILI 4.1*   PT/INR Recent Labs    04/01/21 2303  LABPROT 17.9*  INR 1.5*   Studies/Results: CT ABDOMEN PELVIS W CONTRAST  Result Date: 04/02/2021 CLINICAL DATA:  Abdominal distension EXAM: CT ABDOMEN AND PELVIS WITH CONTRAST TECHNIQUE: Multidetector CT imaging of the abdomen and pelvis was performed using the standard protocol following bolus  administration of intravenous contrast. CONTRAST:  3m OMNIPAQUE IOHEXOL 350 MG/ML SOLN COMPARISON:  None. FINDINGS: Lower chest: The visualized lung bases are clear. Multi-vessel coronary artery stenting has been performed. Advanced coronary artery calcification noted. Global cardiac size within normal limits. No pericardial effusion. Hepatobiliary: The liver is shrunken, the liver contour is nodular, and there is hypertrophy of the left and caudate lobes in keeping with changes of advanced cirrhosis. No enhancing intrahepatic mass is identified. No intra or extrahepatic biliary ductal dilation. There is interval thrombosis of the main and intrahepatic right and left portal veins with numerous peribiliary venous collaterals identified. Cholelithiasis without pericholecystic inflammatory change noted. Pancreas: Unremarkable Spleen: Stable splenomegaly with the spleen measuring 15.2 cm in greatest dimension. No intrasplenic lesions are seen. Adrenals/Urinary Tract: The adrenal glands are unremarkable. The kidneys are normal in position. Mild cortical scarring noted within the upper pole of the left kidney. The kidneys are otherwise unremarkable. The bladder is unremarkable. Stomach/Bowel: The stomach and small bowel are unremarkable. The appendix is absent. There is circumferential thickening of the cecum and ascending colon likely reflecting changes of portal colopathy. The colon is otherwise unremarkable. Mild ascites is present, similar to prior examination. No free intraperitoneal gas. Vascular/Lymphatic: Numerous gastroepiploic venous collaterals and a splenorenal shunt are identified. Moderate aortoiliac atherosclerotic calcification. No aortic aneurysm. No pathologic adenopathy within the abdomen and pelvis. Reproductive: Moderate prostatic enlargement again noted. Other: Tiny fat containing left inguinal hernia and small ascites containing right inguinal hernia are identified. Ventral hernia repair with mesh  has been performed. Musculoskeletal: No acute bone abnormality. Osseous structures are age-appropriate. IMPRESSION: Coronary artery calcification. Evidence of prior coronary artery stenting. Changes of advanced cirrhosis. Interval thrombosis of the portal vein with peribiliary venous collateralization. Evidence of portal venous hypertension with portosystemic collateralization, progressive, moderate splenomegaly and mild, stable ascites. Cholelithiasis. Circumferential thickening of the cecum and ascending colon likely reflecting changes of portal colopathy. Infectious or inflammatory colitis is possible, but considered less likely. No evidence of obstruction or perforation. Moderate prostatic enlargement. Aortic Atherosclerosis (ICD10-I70.0). Electronically Signed   By: AFidela SalisburyM.D.   On: 04/02/2021 02:42   DG Chest Portable 1 View  Result Date: 04/02/2021 CLINICAL DATA:  Worsening cirrhosis and abdominal distension EXAM: PORTABLE CHEST 1 VIEW COMPARISON:  Abdominal CT from earlier today FINDINGS: Low volume chest with indistinct density at the bases attributed atelectasis. Borderline heart size for technique, accentuated by mediastinal fat. Coronary stenting. No Kerley lines, effusion, or pneumothorax. IMPRESSION: Low volume chest with mild atelectasis at the bases. Electronically Signed   By: JJorje GuildM.D.   On: 04/02/2021 05:27    IMPRESSION:  *70year old male with known nonalcohol related [place] [facility] with [place] pain and elevated LFTs, particularly increasing total bilirubin at 10.8.  CT scan showed changes of cirrhosis and  cholelithiasis.  Question if these symptoms and findings are suggestive of decompensation of his cirrhosis and some acute component of PVT versus biliary obstruction.  MRCP did in fact show choledocholithiasis, but no ductal dilatation.  Viral hepatitis panel is pending.  If his numbers are representative of his liver disease then  his MELD would be 24. *Coagulopathy: INR 1.5. *Known chronic portal vein thrombosis on chronic Eliquis, but question if there is an acute component as well so ? If this could be contributing to his pain.  He has been placed on heparin drip here. *Liver lesion:  Recommend 3 to 24-monthfollow-up imaging.  PLAN: -We will plan for ERCP with Dr. JArdis Hughstomorrow afternoon at 1 PM.  I have spoken to the pharmacist and asked for them to place his heparin on hold at 7 AM for the procedure.  Perioperative antibiotics and indomethacin suppository have been ordered. -Monitor/trend labs. -We will check AFP. -He would like to try to eat so I will put him on a 2 gram sodium diet then NPO after midnight.   JLaban Emperor Hildy Nicholl  04/02/2021, 12:29 PM

## 2021-04-03 ENCOUNTER — Encounter (HOSPITAL_COMMUNITY)
Admission: EM | Disposition: A | Discharge status: Skilled Nursing Facility | Payer: Self-pay | Source: Home / Self Care | Attending: Internal Medicine

## 2021-04-03 ENCOUNTER — Inpatient Hospital Stay (HOSPITAL_COMMUNITY): Payer: No Typology Code available for payment source | Admitting: Anesthesiology

## 2021-04-03 ENCOUNTER — Inpatient Hospital Stay (HOSPITAL_COMMUNITY): Payer: No Typology Code available for payment source

## 2021-04-03 ENCOUNTER — Encounter (HOSPITAL_COMMUNITY): Payer: Self-pay | Admitting: Internal Medicine

## 2021-04-03 HISTORY — PX: ERCP: SHX5425

## 2021-04-03 HISTORY — PX: SPHINCTEROTOMY: SHX5544

## 2021-04-03 LAB — COMPREHENSIVE METABOLIC PANEL
ALT: 84 U/L — ABNORMAL HIGH (ref 0–44)
AST: 145 U/L — ABNORMAL HIGH (ref 15–41)
Albumin: 1.8 g/dL — ABNORMAL LOW (ref 3.5–5.0)
Alkaline Phosphatase: 204 U/L — ABNORMAL HIGH (ref 38–126)
Anion gap: 6 (ref 5–15)
BUN: 22 mg/dL (ref 8–23)
CO2: 19 mmol/L — ABNORMAL LOW (ref 22–32)
Calcium: 8.5 mg/dL — ABNORMAL LOW (ref 8.9–10.3)
Chloride: 108 mmol/L (ref 98–111)
Creatinine, Ser: 1.37 mg/dL — ABNORMAL HIGH (ref 0.61–1.24)
GFR, Estimated: 55 mL/min — ABNORMAL LOW (ref >60)
Glucose, Bld: 73 mg/dL (ref 70–99)
Potassium: 4.5 mmol/L (ref 3.5–5.1)
Sodium: 133 mmol/L — ABNORMAL LOW (ref 135–145)
Total Bilirubin: 10.9 mg/dL — ABNORMAL HIGH (ref 0.3–1.2)
Total Protein: 6.5 g/dL (ref 6.5–8.1)

## 2021-04-03 LAB — PROTIME-INR
INR: 1.7 — ABNORMAL HIGH (ref 0.8–1.2)
INR: 1.6 — ABNORMAL HIGH (ref 0.8–1.2)
Prothrombin Time: 20.4 seconds — ABNORMAL HIGH (ref 11.4–15.2)
Prothrombin Time: 19.3 seconds — ABNORMAL HIGH (ref 11.4–15.2)

## 2021-04-03 LAB — PREALBUMIN: Prealbumin: 5.7 mg/dL — ABNORMAL LOW (ref 18–38)

## 2021-04-03 LAB — HEMOGLOBIN A1C
Hgb A1c MFr Bld: 8.8 % — ABNORMAL HIGH (ref 4.8–5.6)
Mean Plasma Glucose: 206 mg/dL

## 2021-04-03 LAB — ABO/RH: ABO/RH(D): B POS

## 2021-04-03 LAB — SURGICAL PCR SCREEN
MRSA, PCR: NEGATIVE
Staphylococcus aureus: NEGATIVE

## 2021-04-03 LAB — GLUCOSE, CAPILLARY
Glucose-Capillary: 70 mg/dL (ref 70–99)
Glucose-Capillary: 101 mg/dL — ABNORMAL HIGH (ref 70–99)
Glucose-Capillary: 82 mg/dL (ref 70–99)
Glucose-Capillary: 163 mg/dL — ABNORMAL HIGH (ref 70–99)
Glucose-Capillary: 430 mg/dL — ABNORMAL HIGH (ref 70–99)

## 2021-04-03 SURGERY — ERCP, WITH INTERVENTION IF INDICATED
Anesthesia: General

## 2021-04-03 MED ORDER — ROCURONIUM BROMIDE 10 MG/ML (PF) SYRINGE
PREFILLED_SYRINGE | INTRAVENOUS | Status: DC | PRN
  Administered 2021-04-03: 60 mg via INTRAVENOUS

## 2021-04-03 MED ORDER — INDOMETHACIN 50 MG RE SUPP
100.0000 mg | Freq: Once | RECTAL | Status: DC
Start: 2021-04-03 — End: 2021-04-06

## 2021-04-03 MED ORDER — PHENYLEPHRINE HCL-NACL 20-0.9 MG/250ML-% IV SOLN
INTRAVENOUS | Status: DC | PRN
  Administered 2021-04-03: 20 ug/min via INTRAVENOUS

## 2021-04-03 MED ORDER — INDOMETHACIN 50 MG RE SUPP
RECTAL | Status: AC
  Filled 2021-04-03: qty 2

## 2021-04-03 MED ORDER — GLUCAGON HCL RDNA (DIAGNOSTIC) 1 MG IJ SOLR
INTRAMUSCULAR | Status: AC
  Filled 2021-04-03: qty 1

## 2021-04-03 MED ORDER — DEXTROSE 50 % IV SOLN
12.5000 g | INTRAVENOUS | Status: AC
  Administered 2021-04-03: 12.5 g via INTRAVENOUS

## 2021-04-03 MED ORDER — HEPARIN (PORCINE) 25000 UT/250ML-% IV SOLN
1500.0000 [IU]/h | INTRAVENOUS | Status: DC
  Administered 2021-04-03: 1450 [IU]/h via INTRAVENOUS
  Administered 2021-04-04 – 2021-04-05 (×2): 1500 [IU]/h via INTRAVENOUS
  Filled 2021-04-03 (×6): qty 250

## 2021-04-03 MED ORDER — SODIUM CHLORIDE 0.9 % IV SOLN
INTRAVENOUS | Status: DC
Start: 2021-04-03 — End: 2021-04-03

## 2021-04-03 MED ORDER — INDOMETHACIN 50 MG RE SUPP
RECTAL | Status: DC | PRN
  Administered 2021-04-03: 100 mg via RECTAL

## 2021-04-03 MED ORDER — SUGAMMADEX SODIUM 200 MG/2ML IV SOLN
INTRAVENOUS | Status: DC | PRN
  Administered 2021-04-03: 200 mg via INTRAVENOUS

## 2021-04-03 MED ORDER — HYDROCORT-PRAMOXINE (PERIANAL) 1-1 % EX FOAM
1.0000 | Freq: Four times a day (QID) | CUTANEOUS | Status: DC | PRN
  Filled 2021-04-03: qty 10

## 2021-04-03 MED ORDER — PHENYLEPHRINE 40 MCG/ML (10ML) SYRINGE FOR IV PUSH (FOR BLOOD PRESSURE SUPPORT)
PREFILLED_SYRINGE | INTRAVENOUS | Status: DC | PRN
  Administered 2021-04-03: 120 ug via INTRAVENOUS

## 2021-04-03 MED ORDER — FENTANYL CITRATE (PF) 250 MCG/5ML IJ SOLN
INTRAMUSCULAR | Status: DC | PRN
  Administered 2021-04-03: 50 ug via INTRAVENOUS

## 2021-04-03 MED ORDER — SODIUM CHLORIDE 0.9% IV SOLUTION: Freq: Once | INTRAVENOUS | Status: AC

## 2021-04-03 MED ORDER — LACTATED RINGERS IV SOLN: INTRAVENOUS | Status: DC | PRN

## 2021-04-03 MED ORDER — ONDANSETRON HCL 4 MG/2ML IJ SOLN: 4.0000 mg | Freq: Once | INTRAMUSCULAR | Status: DC | PRN

## 2021-04-03 MED ORDER — LIDOCAINE 2% (20 MG/ML) 5 ML SYRINGE
INTRAMUSCULAR | Status: DC | PRN
  Administered 2021-04-03: 60 mg via INTRAVENOUS

## 2021-04-03 MED ORDER — CIPROFLOXACIN IN D5W 400 MG/200ML IV SOLN
INTRAVENOUS | Status: AC
  Filled 2021-04-03: qty 200

## 2021-04-03 MED ORDER — PROPOFOL 10 MG/ML IV BOLUS
INTRAVENOUS | Status: DC | PRN
  Administered 2021-04-03: 120 mg via INTRAVENOUS

## 2021-04-03 MED ORDER — FENTANYL CITRATE (PF) 100 MCG/2ML IJ SOLN
INTRAMUSCULAR | Status: AC
  Filled 2021-04-03: qty 2

## 2021-04-03 MED ORDER — FENTANYL CITRATE (PF) 100 MCG/2ML IJ SOLN: 25.0000 ug | INTRAMUSCULAR | Status: DC | PRN

## 2021-04-03 MED ORDER — INSULIN ASPART 100 UNIT/ML IJ SOLN
6.0000 [IU] | Freq: Once | INTRAMUSCULAR | Status: AC
  Administered 2021-04-03: 6 [IU] via SUBCUTANEOUS

## 2021-04-03 MED ORDER — ONDANSETRON HCL 4 MG/2ML IJ SOLN
INTRAMUSCULAR | Status: DC | PRN
Start: 2021-04-03 — End: 2021-04-03
  Administered 2021-04-03: 4 mg via INTRAVENOUS

## 2021-04-03 MED ORDER — SODIUM CHLORIDE 0.9 % IV SOLN: INTRAVENOUS | Status: DC | PRN

## 2021-04-03 MED ORDER — CIPROFLOXACIN IN D5W 400 MG/200ML IV SOLN
400.0000 mg | INTRAVENOUS | Status: AC
  Administered 2021-04-03: 400 mg via INTRAVENOUS

## 2021-04-03 MED ORDER — DEXAMETHASONE SODIUM PHOSPHATE 10 MG/ML IJ SOLN
INTRAMUSCULAR | Status: DC | PRN
  Administered 2021-04-03: 4 mg via INTRAVENOUS

## 2021-04-03 NOTE — Progress Notes (Signed)
OT Cancellation Note  Patient Details Name: Jerry [name] MRN: [id number] DOB: December 29, 1950   Cancelled Treatment:    Reason Eval/Treat Not Completed: Patient at procedure or test/ unavailable. Will follow up as able.  Shay Bartoli C acute rehab [place] 04/03/2021, 1:01 PM

## 2021-04-03 NOTE — Progress Notes (Signed)
Patient left floor to endo awake,alert/orientedx4 with transport.

## 2021-04-03 NOTE — Anesthesia Preprocedure Evaluation (Addendum)
Anesthesia Evaluation  Patient identified by MRN, date of birth, ID band Patient awake    Reviewed: Allergy & Precautions, NPO status , Patient's Chart, lab work & pertinent test results, reviewed documented beta blocker date and time   History of Anesthesia Complications Negative for: history of anesthetic complications  Airway Mallampati: II  TM Distance: >3 FB Neck ROM: Full    Dental  (+) Edentulous Lower, Edentulous Upper   Pulmonary neg pulmonary ROS,    Pulmonary exam normal        Cardiovascular hypertension, Pt. on home beta blockers and Pt. on medications + CAD and + Cardiac Stents  Normal cardiovascular exam   '18 Cath - Patent circumflex stent. Prox LAD lesion, 30 %stenosed. Mid RCA lesion, 25 %stenosed. Ost RCA to Prox RCA lesion, 20 %stenosed. The left ventricular systolic function is normal. LV end diastolic pressure is normal. The left ventricular ejection fraction is 55-65% by visual estimate. There is no aortic valve stenosis. Mid LAD lesion, 75 %stenosed. A STENT SYNERGY DES 2.75X28 drug eluting stent was successfully placed, postdilated to 3.25 mm. Post intervention, there is a 0% residual stenosis.     Neuro/Psych  Headaches, PSYCHIATRIC DISORDERS Anxiety    GI/Hepatic hiatal hernia, GERD  Medicated and Controlled,(+) Cirrhosis       , Hepatitis - LFTs elevated, AST>ALT    Endo/Other  diabetes, Type 2, Oral Hypoglycemic Agents Na 133   Renal/GU  Renal cancer s/p partial left nephrectomy      Musculoskeletal  (+) Arthritis ,   Abdominal   Peds  Hematology  On eliquis INR 1.7    Anesthesia Other Findings   Reproductive/Obstetrics                            Anesthesia Physical Anesthesia Plan  ASA: 3  Anesthesia Plan: General   Post-op Pain Management:    Induction: Intravenous  PONV Risk Score and Plan: 2 and Treatment may vary due to age or  medical condition, Ondansetron and Dexamethasone  Airway Management Planned: Oral ETT  Additional Equipment: None  Intra-op Plan:   Post-operative Plan: Extubation in OR  Informed Consent: I have reviewed the patients History and Physical, chart, labs and discussed the procedure including the risks, benefits and alternatives for the proposed anesthesia with the patient or authorized representative who has indicated his/her understanding and acceptance.     Dental advisory given  Plan Discussed with: CRNA and Anesthesiologist  Anesthesia Plan Comments:        Anesthesia Quick Evaluation

## 2021-04-03 NOTE — Plan of Care (Signed)
  Problem: Education: Goal: Knowledge of General Education information will improve Description: Including pain rating scale, medication(s)/side effects and non-pharmacologic comfort measures Outcome: Progressing   Problem: Clinical Measurements: Goal: Ability to maintain clinical measurements within normal limits will improve Outcome: Progressing Goal: Will remain free from infection Outcome: Progressing Goal: Diagnostic test results will improve Outcome: Progressing   Problem: Nutrition: Goal: Adequate nutrition will be maintained Outcome: Progressing   Problem: Elimination: Goal: Will not experience complications related to bowel motility Outcome: Progressing Goal: Will not experience complications related to urinary retention Outcome: Progressing   Problem: Pain Managment: Goal: General experience of comfort will improve Outcome: Progressing   Problem: Safety: Goal: Ability to remain free from injury will improve Outcome: Progressing    Progressing towards goals as outlined above. Report received from ED RN and care assumed. Patient to floor in stable condition via stretcher. Assisted to bed via slide. Oriented to room, bed, bathroom, call bell, TV, unit, policies/procedures - verbalizes understanding. VS obtained, shift/admission assessments completed - see flowsheets. Denies pain. Patient able to turn and reposition self in bed. Educated patient on high fall risk and need to call for assistance when getting OOB - verbalizes understanding. Patient c/o dizziness that has been present since prior to admission, states unchanged at this time. Heparin gtt infusing as ordered, see MAR. Currently resting in bed, bed in lowest position. Denies needs. Call bell within reach. Bedalarm in use at all times.

## 2021-04-03 NOTE — Progress Notes (Signed)
   04/03/21 1010  Clinical Encounter Type  Visited With Patient  Visit Type Initial  Referral From Nurse  Consult/Referral To Chaplain   Chaplain responded to Advance Directive consult. Chaplain provided education and answered Pt's questions. Pt plans on filling it out this afternoon when their health care agent visits. Pt will notify their nurse when AD is ready to be notarized. No further spiritual care needs at this time. Chaplain remains available.  This note was prepared by Chaplain Resident, Dante Gang, MDiv. Chaplain remains available as needed through the on-call pager: [phone].

## 2021-04-03 NOTE — ED Notes (Signed)
Gave report via phone to floor RN

## 2021-04-03 NOTE — Progress Notes (Signed)
Patient arrived back to unit awake,alert/orientedx4 and able to verbalize needs. VSS; NAD noted; respirtations easy/even on room air. Movement/sensation to all extremities noted. Continuous pulse ox connected. Diet order released and patient educated. All safety measures in place and personal belongings within reach.

## 2021-04-03 NOTE — Op Note (Addendum)
[facility] Patient Name: Jerry [name] Procedure Date : 04/03/2021 MRN: [id number] Attending MD: Milus Banister , MD Date of Birth: [birth year] CSN: [id number] Age: 70 Admit Type: Outpatient Procedure:                ERCP Indications:              Cirrhosis, rising liver tests and abdominal pain,                            MRI/MRCP "tiny biliary calculus in the distal                            common bile duct is suspected" Providers:                Milus Banister, MD, Elmer Ramp. Tilden Dome, RN, Benetta Spar, Technician Referring MD:              Medicines:                General Anesthesia, Cipro 400 mg IV, Indomethacin                            366 mg PR Complications:            No immediate complications. Estimated blood loss:                            None Estimated Blood Loss:     Estimated blood loss: none. Procedure:                Pre-Anesthesia Assessment:                           - Prior to the procedure, a History and Physical                            was performed, and patient medications and                            allergies were reviewed. The patient's tolerance of                            previous anesthesia was also reviewed. The risks                            and benefits of the procedure and the sedation                            options and risks were discussed with the patient.                            All questions were answered, and informed consent  was obtained. Prior Anticoagulants: The patient has                            taken no previous anticoagulant or antiplatelet                            agents. ASA Grade Assessment: IV - A patient with                            severe systemic disease that is a constant threat                            to life. After reviewing the risks and benefits,                            the patient was deemed in satisfactory condition to                             undergo the procedure.                           After obtaining informed consent, the scope was                            passed under direct vision. Throughout the                            procedure, the patient's blood pressure, pulse, and                            oxygen saturations were monitored continuously. The                            [id number]-Q180V ([id number] Olympus duodenoscope was                            introduced through the mouth, and used to inject                            contrast into and used to inject contrast into the                            bile duct. The GIF-H190 ([id number]) Olympus endoscope                            was introduced through the mouth, and used to                            inject contrast into. The ERCP was accomplished                            without difficulty. The patient tolerated the  procedure well. Scope In: Scope Out: Findings:      Using adult gastroscope I performed EGD prior to the ERCP to screen for       varices and portal hypertensive changes. This showed large distal and       mid esophagus varices without signs of recent or impending bleeding.       There were changes of moderate portal gastropathy throughout the       stomach. The duodenum was normal.      I then advanced the duodena scope to the region of the major papilla       which was relatively small appearing. I used a 44 autotome over 8.035       Hydra wire to cannulate the bile duct and then injected contrast.       Cholangiogram showed a nondilated common bile duct, common hepatic duct.       The cystic duct opacified. There was a 3 to 4 mm mobile filling defect       within the bile duct which seem to correlate with MRCP finding of CBD       stone and so I performed a limited biliary sphincterotomy over the wire       and then swept the duct several times with a 9 mm balloon. This yielded       no stones,  stone debris or purulence. A completion occlusion       cholangiogram showed no remaining filling defects. The main pancreatic       duct was never cannulated with a wire or injected with dye. Impression:               - Large esophageal varices (without signs of recent                            or impending bleeding) and moderate portal                            gastropathy.                           - Relatively small major papilla.                           - Nondilated biliary tree including common bile                            duct, common hepatic duct. The cystic duct                            opacified and was also normal. A small mobile                            filling defect noted on cholangiogram correlated                            with MRI suggested bile duct stone. This was                            treated with biliary sphincterotomy and balloon  sweeping however no stones were delivered and I                            suspect that the filling defect noted today was                            probably a small air bubble. Recommendation:           - Return to hospital room. I will allow low salt                            diet.                           - Follow liver tests, I think his overall clinical                            picture is that of decompensation of known liver                            disease.                           - Consider starting non-selective B blocker for his                            large esophageal varices.                           - OK to resume his heparin at 6pm tonight however                            please note that blood thinners in the setting of                            portal gastropathy and large esophageal varices may                            be risky. Procedure Code(s):        --- Professional ---                           [phone], Endoscopic retrograde                             cholangiopancreatography (ERCP); with                            sphincterotomy/papillotomy Diagnosis Code(s):        --- Professional ---                           K80.50, Calculus of bile duct without cholangitis                            or cholecystitis without obstruction CPT copyright 2019  American Medical Association. All rights reserved. The codes documented in this report are preliminary and upon coder review may  be revised to meet current compliance requirements. Milus Banister, MD 04/03/2021 2:17:28 PM This report has been signed electronically. Number of Addenda: 0

## 2021-04-03 NOTE — Anesthesia Postprocedure Evaluation (Signed)
Anesthesia Post Note  Patient: Jerry [name]  Procedure(s) Performed: ENDOSCOPIC RETROGRADE CHOLANGIOPANCREATOGRAPHY (ERCP) SPHINCTEROTOMY     Patient location during evaluation: PACU Anesthesia Type: General Level of consciousness: awake and alert Pain management: pain level controlled Vital Signs Assessment: post-procedure vital signs reviewed and stable Respiratory status: spontaneous breathing, nonlabored ventilation and respiratory function stable Cardiovascular status: stable and blood pressure returned to baseline Anesthetic complications: no   No notable events documented.  Last Vitals:  Vitals:   04/03/21 1502 04/03/21 1703  BP: (!) 146/68 (!) 147/73  Pulse: (!) 57 (!) 52  Resp: 16 18  Temp:  (!) 36.4 C  SpO2: 100% 100%    Last Pain:  Vitals:   04/03/21 1703  TempSrc: Oral  PainSc:                  Audry Pili

## 2021-04-03 NOTE — Anesthesia Procedure Notes (Signed)
Procedure Name: Intubation Date/Time: 04/03/2021 1:13 PM Performed by: Lowella Dell, CRNA Pre-anesthesia Checklist: Patient identified, Emergency Drugs available, Suction available and Patient being monitored Patient Re-evaluated:Patient Re-evaluated prior to induction Oxygen Delivery Method: Circle System Utilized Preoxygenation: Pre-oxygenation with 100% oxygen Induction Type: IV induction Ventilation: Mask ventilation without difficulty Laryngoscope Size: Mac and 4 Tube type: Oral Tube size: 7.5 mm Number of attempts: 1 Airway Equipment and Method: Stylet Placement Confirmation: ETT inserted through vocal cords under direct vision, positive ETCO2 and breath sounds checked- equal and bilateral Secured at: 22 cm Tube secured with: Tape Dental Injury: Teeth and Oropharynx as per pre-operative assessment

## 2021-04-03 NOTE — Interval H&P Note (Signed)
History and Physical Interval Note:  04/03/2021 12:39 PM  Jerry [name]  has presented today for surgery, with the diagnosis of Stones in bile duct, abdominal pain, elevated LFTs.  The various methods of treatment have been discussed with the patient and family. After consideration of risks, benefits and other options for treatment, the patient has consented to  Procedure(s): ENDOSCOPIC RETROGRADE CHOLANGIOPANCREATOGRAPHY (ERCP) (N/A) as a surgical intervention.  The patient's history has been reviewed, patient examined, no change in status, stable for surgery.  I have reviewed the patient's chart and labs.  Questions were answered to the patient's satisfaction.     Milus Banister

## 2021-04-03 NOTE — Evaluation (Addendum)
Physical Therapy Evaluation/Vestibular Assessment Patient Details Name: Jerry [name] MRN: [id number] DOB: [birth year] Today's Date: 04/03/2021  History of Present Illness  70 y.o. male admitted on 04/01/21 for abdominal pain, jaundice, and dizziness.  Pt s/p ERCP on 9/28.  Pt dx with hyperbilirubinemia, non-alcoholic cirrhosis, 54m R liver lesion, coagulopathy s/p 2u FFP, dizziness, acute hepatic encephalopathy, hypoalbuminemia, severe protein calorie malnutrition.  Pt with significant PMH of DM, HTN, portal vein thrombosis, CAD, HTN, PTSD, paracentesis, L reverse TSA.  Clinical Impression  Pt has vague reports of dizziness that the more I press sound very vertiginous.  He is unable to move his head and focus his gaze, gets nauseated with movement.  He was unable to tolerate much testing this evening (seemed to still be feeling the effects of anesthesia).  He did not want to stand or get up out of the bed for fear of falling.  He would benefit from continued vestibular assessment and gait training with assistive device (bring cane as he feels a RW would not fit in his house).   PT to follow acutely for deficits listed below.        Recommendations for follow up therapy are one component of a multi-disciplinary discharge planning process, led by the attending physician.  Recommendations may be updated based on patient status, additional functional criteria and insurance authorization.  Follow Up Recommendations Home health PT;Other (comment) (vestibular PT if able)    Equipment Recommendations  Cane    Recommendations for Other Services       Precautions / Restrictions Precautions Precautions: Fall      Mobility  Bed Mobility Overal bed mobility: Needs Assistance Bed Mobility: Supine to Sit;Sit to Supine     Supine to sit: Min assist Sit to supine: Min assist   General bed mobility comments: Min assist to support trunk to come to sitting EOB, slow moving, extra time needed to  complete and scoot to edge, min assist to help trunk come back to bed, supine.    Transfers                 General transfer comment: NT, pt nauseated EOB  Ambulation/Gait                Stairs            Wheelchair Mobility    Modified Rankin (Stroke Patients Only)       Balance Overall balance assessment: Mild deficits observed, not formally tested        04/03/21 1709  Symptom Behavior  Subjective history of current problem unsteadiness, off balance, "dizzy"  Type of Dizziness  Imbalance;Unsteady with head/body turns  Frequency of Dizziness intermittent  Duration of Dizziness long  Symptom Nature Motion provoked  Aggravating Factors Activity in general  Relieving Factors Closing eyes;Rest;Slow movements  Progression of Symptoms Worse  Oculomotor Exam  Oculomotor Alignment Normal  Ocular ROM WNL  Spontaneous Absent  Gaze-induced  Absent  Smooth Pursuits Saccades  Vestibulo-Ocular Reflex  VOR 1 Head Only (x 1 viewing) difficulty with both vert and horiz with vert with wose symptoms  Positional Testing  Sidelying Test Sidelying Right  Sidelying Right  Sidelying Right Symptoms No nystagmus (pt got into position very slowly, would like to increase speed or do true dix hallpike next session.)  Orthostatics  Orthostatics Comment BP, O2, HR stable throughout on RA.  Pertinent Vitals/Pain Pain Assessment: Faces Faces Pain Scale: Hurts little more Pain Location: generalized Pain Intervention(s): Limited activity within patient's tolerance;Monitored during session;Repositioned    Home Living Family/patient expects to be discharged to:: Private residence Living Arrangements: Alone Available Help at Discharge: Friend(s);Available PRN/intermittently (significant other (girlfriend for 16 months)) Type of Home: House Home Access: Stairs to enter Entrance Stairs-Rails: None Entrance Stairs-Number  of Steps: 3 Home Layout: One level Home Equipment: None      Prior Function Level of Independence: Independent         Comments: per pt report he has been feeling weak and dizzy lately, furnutre walking, does not feel he has space for a RW in his house.     Hand Dominance   Dominant Hand: Right    Extremity/Trunk Assessment   Upper Extremity Assessment Upper Extremity Assessment: Defer to OT evaluation    Lower Extremity Assessment Lower Extremity Assessment: RLE deficits/detail;LLE deficits/detail RLE Deficits / Details: at least 3/5 per gross functional assessment and equal bil LLE Deficits / Details: at least 3/5 per gross functional assessment and equal bil    Cervical / Trunk Assessment Cervical / Trunk Assessment: Normal  Communication   Communication: HOH (has hearing aids (not currently with him).)  Cognition Arousal/Alertness: Awake/alert Behavior During Therapy: WFL for tasks assessed/performed Overall Cognitive Status: Impaired/Different from baseline Area of Impairment: Problem solving                             Problem Solving: Slow processing General Comments: Pt did just come back from procedure with general anesthesia, but chart review also report hepatic encephalopathy.      General Comments      Exercises     Assessment/Plan    PT Assessment Patient needs continued PT services  PT Problem List Decreased strength;Decreased activity tolerance;Decreased balance;Decreased mobility;Decreased cognition;Decreased knowledge of use of DME       PT Treatment Interventions DME instruction;Gait training;Stair training;Functional mobility training;Therapeutic activities;Therapeutic exercise;Balance training;Patient/family education;Neuromuscular re-education;Cognitive remediation    PT Goals (Current goals can be found in the Care Plan section)  Acute Rehab PT Goals Patient Stated Goal: to feel better PT Goal Formulation: With  patient Time For Goal Achievement: 04/17/21 Potential to Achieve Goals: Good    Frequency Min 4X/week (secondary to vestibular)   Barriers to discharge        Co-evaluation               AM-PAC PT "6 Clicks" Mobility  Outcome Measure Help needed turning from your back to your side while in a flat bed without using bedrails?: A Little Help needed moving from lying on your back to sitting on the side of a flat bed without using bedrails?: A Little Help needed moving to and from a bed to a chair (including a wheelchair)?: A Little Help needed standing up from a chair using your arms (e.g., wheelchair or bedside chair)?: A Little Help needed to walk in hospital room?: A Little Help needed climbing 3-5 steps with a railing? : A Lot 6 Click Score: 17    End of Session   Activity Tolerance: Patient limited by fatigue;Other (comment) (by nausea) Patient left: in bed;with call bell/phone within reach;with family/visitor present Nurse Communication: Mobility status PT Visit Diagnosis: Muscle weakness (generalized) (M62.81);Dizziness and giddiness (R42);Difficulty in walking, not elsewhere classified (R26.2)    Time: 5277-8242 PT Time Calculation (min) (ACUTE ONLY): 56 min   Charges:  PT Evaluation $PT Eval Moderate Complexity: 1 Mod PT Treatments $Therapeutic Activity: 23-37 mins        Verdene Lennert, PT, DPT  Acute Rehabilitation Ortho Tech Supervisor [phone] pager [phone]) [phone] office

## 2021-04-03 NOTE — Transfer of Care (Signed)
Immediate Anesthesia Transfer of Care Note  Patient: Jerry [name]  Procedure(s) Performed: ENDOSCOPIC RETROGRADE CHOLANGIOPANCREATOGRAPHY (ERCP)  Patient Location: PACU and Endoscopy Unit  Anesthesia Type:General  Level of Consciousness: awake, oriented and patient cooperative  Airway & Oxygen Therapy: Patient Spontanous Breathing and Patient connected to face mask oxygen  Post-op Assessment: Report given to RN and Post -op Vital signs reviewed and stable  Post vital signs: Reviewed and stable  Last Vitals:  Vitals Value Taken Time  BP    Temp    Pulse 59 04/03/21 1425  Resp 15 04/03/21 1425  SpO2 99 % 04/03/21 1425  Vitals shown include unvalidated device data.  Last Pain:  Vitals:   04/03/21 1246  TempSrc: Temporal  PainSc: 0-No pain      Patients Stated Pain Goal: 0 (20/04/15 9301)  Complications: No notable events documented.

## 2021-04-03 NOTE — Progress Notes (Signed)
Ong for heparin Indication:  portal vein thrombosis  Allergies  Allergen Reactions   Atorvastatin Other (See Comments)    Unknown   Metoprolol Other (See Comments)    bradycardia   Penicillins Swelling    Swelling at injection site Has patient had a PCN reaction causing immediate rash, facial/tongue/throat swelling, SOB or lightheadedness with hypotension: No Has patient had a PCN reaction causing severe rash involving mucus membranes or skin necrosis: No Has patient had a PCN reaction that required hospitalization No Has patient had a PCN reaction occurring within the last 10 years: No If all of the above answers are "NO", then may proceed with Cephalosporin use.   Rosuvastatin Other (See Comments)    unknown   Simvastatin Other (See Comments)    unknown   Terazosin Other (See Comments)    Orthostatic hypotension    Patient Measurements: Height: 5' 11"  (180.3 cm) Weight: 82.1 kg (181 lb) IBW/kg (Calculated) : 75.3 Heparin Dosing Weight: 81.2 kg  Vital Signs: Temp: 97.3 F (36.3 C) (09/28 1424) Temp Source: Temporal (09/28 1424) BP: 146/67 (09/28 1424) Pulse Rate: 59 (09/28 1425)  Labs: Recent Labs    04/01/21 2303 04/02/21 1224 04/02/21 2305 04/03/21 0325 04/03/21 0639  HGB 13.2  --   --   --   --   HCT 38.4*  --   --   --   --   PLT 211  --   --   --   --   APTT  --  38* 74*  --   --   LABPROT 17.9* 18.7*  --   --  20.4*  INR 1.5* 1.6*  --   --  1.7*  HEPARINUNFRC  --  0.13*  --   --   --   CREATININE 1.16  --   --  1.37*  --      Estimated Creatinine Clearance: 52.7 mL/min (A) (by C-G formula based on SCr of 1.37 mg/dL (H)).   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 Apixaban held for procedure and started on heparin. Last dose apixaban 9/26 @10 :30. MCRP shows worsening PVT compared to 2017, possible acute on chronic thrombus. Pharmacy consulted for heparin.   Heparin held 9/28 7am for ERCP. Per GI MD, ok to restart at 18:00. Last aPTT 74 at low end of goal on 1400 units/hr.    Goal of Therapy:  Heparin level 0.3-0.7 units/mL aPTT 66-102 secs Monitor platelets by anticoagulation protocol: Yes   Plan:  Restart heparin at 1450 units/hr at 18:00 F/u aPTT until correlates with heparin level  Monitor daily aPTT, HL, CBC/plt Monitor for signs/symptoms of bleeding F/u long term anticoagulation plan    Alma Friendly  Bridgett Larsson, PharmD, BCPS, BCCP Clinical Pharmacist  Please check AMION for all [place] phone numbers After 10:00 PM, call [place] [phone]

## 2021-04-03 NOTE — Progress Notes (Signed)
INR rising, I ordered 2 units FFP to decrease the chances of significant bleeding during today's ERCP.  Real balancing act here between his known blood clots and then also risk of procedure related bleeding.

## 2021-04-03 NOTE — Progress Notes (Signed)
PROGRESS NOTE  KNOWLEDGE ESCANDON  VVO:[id number] DOB: Mar 12, 1951 DOA: 04/01/2021 PCP: Deberah Pelton, MD - Most of care at [facility].   Brief Narrative: KALIL WOESSNER is a 70 y.o. male with a history of nonalcoholic hepatic cirrhosis, PVT, ascites, kidney cancer, CAD s/p stenting x2, and GI bleed who presented to the ED with lower abdominal pain, jaundice, and dizziness. In the ED he had stable vital signs. WBC 10.6, sodium 132, BUN 27, glucose 77, alkaline phosphatase 264->208, albumin 2.2,  lipase 90, AST 149-> 138, ALT 105-> 92, total bilirubin 10.8->10, and ammonia 72.  CT scan of the abdomen pelvis with contrast noted coronary artery calcification, advanced cirrhosis with interval thrombosis of the portal vein and evidence of portal venous hypertension with splenomegaly, mild stable ascites, cholelithiasis, and circumferential thickening of the cecum along with ascending colon reflecting portal colopathy.  Urinalysis significant for elevated specific gravity of 1.036 but no signs of infection noted.  He was admitted with GI consult who is to perform ERCP 9/28.  Assessment & Plan: Principal Problem:   Choledocholithiasis with obstruction Active Problems:   Portal vein thrombosis   Type 2 diabetes mellitus without complication, without long-term current use of insulin (HCC)   Essential hypertension   Liver cirrhosis secondary to NASH (HCC)   Transaminitis   Jaundice   Leukocytosis   GERD (gastroesophageal reflux disease)  Hyperbilirubinemia, LFT elevations:  - With suggestion of filling defect on imaging, ERCP was pursued 9/28 with performance of sphincterotomy and sweeping with no stone passed. These findings, including abdominal pain are all likely due, therefore, to chronic, progressive liver dysfunction and PVT.  - Trend in AM.  Nonalcoholic cirrhosis with portal vein thrombosis, portal HTN, ascites, esophageal varices: MELD score is 24 with 19.6% 63-monthestimated mortality.  - Continue  home nonspecific beta blocker (coreg) - Continue lasix 268mdaily, spironolactone 509maily.  - Low sodium diet - After ERCP, GI suspected further decompensation of cirrhosis is to blame for clinical presentation.  7mm43mght liver lesion: Seen on CT (report below):  - Definitively needs follow-up MRI with and without contrast in 3 months  Coagulopathy: s/p 2u FFP this AM per GI.  - Ok to restart heparin gtt at 6pm per GI for portal vein thrombosis, understanding risk of life-threatening GI bleeding.   Dizziness: Suspect diminished EABV contributing, s/p IVF, though he reports symptoms mostly associated with head moving. - Hold IVF - Vestibular evaluation requested   Leukocytosis: Acute.  WBC elevated 10.6.  Chest x-ray noted low lung volumes with likely atelectasis and urinalysis did not show signs of infection.  Urine culture nonclonal. Denies having any significant fever or chills. - Recheck CBC in AM   Portal vein thrombosis: Chronic, Dx 2017.  Patient noted to have collaterals on CT imaging. - Heparin gtt tonight, plan to return to eliquis once ok w/GI.   Acute hepatic encephalopathy: Patient reports that he has been intermittently confused.  Ammonia level noted to be 72 on admission.   - Will not continue regular lactulose at this time, monitor mentation.    T2DM: HbA1c 8.8%.  - Hold metformin, glipizide.  - Sensitive SSI - Hypoglycemic protocol in place.   HTN: - Continue coreg as tolerated   Hyperlipidemia:  - Patient previously had been on Crestor, but unclear when this was discontinued as [facility] [place]: Albumin noted to be as low as 1.9 on admission. -Check prealbumin in a.m.  Severe protein calorie malnutrition: Prealbumin 5.7. Severe progressive liver disease contributing.  - Dietitian consult  GERD - Continue PPI  DVT prophylaxis: Heparin gtt Code Status: Full Family Communication: None at bedside Disposition Plan:   Status is: Inpatient  Remains inpatient appropriate because:Ongoing diagnostic testing needed not appropriate for outpatient work up and Inpatient level of care appropriate due to severity of illness  Dispo: The patient is from: Home              Anticipated d/c is to: Home              Patient currently is not medically stable to d/c.  Consultants:  [facility]  Procedures:  ERCP 04/03/2021 Dr. Ardis Hughs:  Impression:       - Large esophageal varices (without signs of recent                            or impending bleeding) and moderate portal                            gastropathy.                           - Relatively small major papilla.                           - Nondilated biliary tree including common bile                            duct, common hepatic duct. The cystic duct                            opacified and was also normal. A small mobile                            filling defect noted on cholangiogram correlated                            with MRI suggested bile duct stone. This was                            treated with biliary sphincterotomy and balloon                            sweeping however no stones were delivered and I                            suspect that the filling defect noted today was                            probably a small air bubble. Recommendation: - Return to hospital room. I will allow low salt                            diet.                           -  Follow liver tests, I think his overall clinical                            picture is that of decompensation of known liver                            disease.                           - Consider starting non-selective B blocker for his                            large esophageal varices.                           - OK to resume his heparin at 6pm tonight however                            please note that blood thinners in the setting of                            portal gastropathy and  large esophageal varices may                            be risky.  Antimicrobials: Cipro surgical ppx 9/28  Subjective: Abdominal pain mostly in RLQ is constant but improved, 3/10, worse with palpation. Dizziness is stable, significant. No bleeding.  Objective: Vitals:   04/03/21 1435 04/03/21 1440 04/03/21 1445 04/03/21 1502  BP: 127/69  139/67 (!) 146/68  Pulse: 61 60 61 (!) 57  Resp: 14 17 15 16   Temp:      TempSrc:      SpO2: 98% 99% 100% 100%  Weight:      Height:        Intake/Output Summary (Last 24 hours) at 04/03/2021 1607 Last data filed at 04/03/2021 1426 Gross per 24 hour  Intake 682 ml  Output 450 ml  Net 232 ml   Filed Weights   04/02/21 1200 04/02/21 1311 04/03/21 1246  Weight: 82.1 kg 82.1 kg 82.1 kg   Gen: 70 y.o. male in no distress  Pulm: Non-labored breathing room air. Clear to auscultation bilaterally.  CV: Regular rate and rhythm. No murmur, rub, or gallop. No JVD, trace pedal edema. GI: Abdomen soft, tender in RLQ without rebound or guarding, elsewhere non-tender, non-distended, with normoactive bowel sounds. No organomegaly or masses felt. Ext: Warm, no deformities Skin: No rashes, lesions or ulcers Neuro: Alert and oriented. No focal neurological deficits. Psych: Judgement and insight appear normal. Mood & affect appropriate.   Data Reviewed: I have personally reviewed following labs and imaging studies  CBC: Recent Labs  Lab 04/01/21 2303  WBC 10.6*  NEUTROABS 7.4  HGB 13.2  HCT 38.4*  MCV 95.5  PLT 916   Basic Metabolic Panel: Recent Labs  Lab 04/01/21 2303 04/03/21 0325  NA 132* 133*  K 4.3 4.5  CL 107 108  CO2 19* 19*  GLUCOSE 77 73  BUN 27* 22  CREATININE 1.16 1.37*  CALCIUM 9.0 8.5*   GFR: Estimated Creatinine Clearance: 52.7 mL/min (A) (by C-G formula based on SCr of 1.37  mg/dL (H)). Liver Function Tests: Recent Labs  Lab 04/01/21 2303 04/02/21 0546 04/03/21 0325  AST 149* 138* 145*  ALT 105* 92* 84*  ALKPHOS  264* 208* 204*  BILITOT 10.8* 10.0* 10.9*  PROT 7.7 6.8 6.5  ALBUMIN 2.2* 1.9* 1.8*   Recent Labs  Lab 04/01/21 2303  LIPASE 90*   Recent Labs  Lab 04/02/21 0103  AMMONIA 72*   Coagulation Profile: Recent Labs  Lab 04/01/21 2303 04/02/21 1224 04/03/21 0639  INR 1.5* 1.6* 1.7*   Cardiac Enzymes: No results for input(s): CKTOTAL, CKMB, CKMBINDEX, TROPONINI in the last 168 hours. BNP (last 3 results) No results for input(s): PROBNP in the last 8760 hours. HbA1C: Recent Labs    04/02/21 0546  HGBA1C 8.8*   CBG: Recent Labs  Lab 04/02/21 1636 04/02/21 1943 04/03/21 0628 04/03/21 0928 04/03/21 1140  GLUCAP 151* 188* 70 101* 82   Lipid Profile: No results for input(s): CHOL, HDL, LDLCALC, TRIG, CHOLHDL, LDLDIRECT in the last 72 hours. Thyroid Function Tests: No results for input(s): TSH, T4TOTAL, FREET4, T3FREE, THYROIDAB in the last 72 hours. Anemia Panel: No results for input(s): VITAMINB12, FOLATE, FERRITIN, TIBC, IRON, RETICCTPCT in the last 72 hours. Urine analysis:    Component Value Date/Time   COLORURINE AMBER (A) 04/02/2021 0546   APPEARANCEUR CLEAR 04/02/2021 0546   LABSPEC 1.036 (H) 04/02/2021 0546   PHURINE 5.0 04/02/2021 0546   GLUCOSEU NEGATIVE 04/02/2021 0546   HGBUR NEGATIVE 04/02/2021 0546   BILIRUBINUR SMALL (A) 04/02/2021 0546   KETONESUR NEGATIVE 04/02/2021 0546   PROTEINUR NEGATIVE 04/02/2021 0546   NITRITE NEGATIVE 04/02/2021 0546   LEUKOCYTESUR NEGATIVE 04/02/2021 0546   Recent Results (from the past 240 hour(s))  Urine Culture     Status: Abnormal   Collection Time: 04/02/21  5:46 AM   Specimen: Urine, Clean Catch  Result Value Ref Range Status   Specimen Description URINE, CLEAN CATCH  Final   Special Requests   Final    NONE Performed at [facility], [place] [address], [place], [place] [address]    Culture MULTIPLE SPECIES PRESENT, SUGGEST RECOLLECTION (A)  Final   Report Status 04/02/2021 FINAL  Final  Resp Panel by  RT-PCR (Flu A&B, Covid) Nasopharyngeal Swab     Status: None   Collection Time: 04/02/21  5:46 AM   Specimen: Nasopharyngeal Swab; Nasopharyngeal(NP) swabs in vial transport medium  Result Value Ref Range Status   SARS Coronavirus 2 by RT PCR NEGATIVE NEGATIVE Final    Comment: (NOTE) SARS-CoV-2 target nucleic acids are NOT DETECTED.  The SARS-CoV-2 RNA is generally detectable in upper respiratory specimens during the acute phase of infection. The lowest concentration of SARS-CoV-2 viral copies this assay can detect is 138 copies/mL. A negative result does not preclude SARS-Cov-2 infection and should not be used as the sole basis for treatment or other patient management decisions. A negative result may occur with  improper specimen collection/handling, submission of specimen other than nasopharyngeal swab, presence of viral mutation(s) within the areas targeted by this assay, and inadequate number of viral copies(<138 copies/mL). A negative result must be combined with clinical observations, patient history, and epidemiological information. The expected result is Negative.  Fact Sheet for Patients:  EntrepreneurPulse.com.au  Fact Sheet for Healthcare Providers:  IncredibleEmployment.be  This test is no t yet approved or cleared by the [place] FDA and  has been authorized for detection and/or diagnosis of SARS-CoV-2 by FDA under an Emergency Use Authorization (EUA). This EUA will  remain  in effect (meaning this test can be used) for the duration of the COVID-19 declaration under Section 564(b)(1) of the Act, 21 U.S.C.section 360bbb-3(b)(1), unless the authorization is terminated  or revoked sooner.       Influenza A by PCR NEGATIVE NEGATIVE Final   Influenza B by PCR NEGATIVE NEGATIVE Final    Comment: (NOTE) The Xpert Xpress SARS-CoV-2/FLU/RSV plus assay is intended as an aid in the diagnosis of influenza from Nasopharyngeal swab  specimens and should not be used as a sole basis for treatment. Nasal washings and aspirates are unacceptable for Xpert Xpress SARS-CoV-2/FLU/RSV testing.  Fact Sheet for Patients: EntrepreneurPulse.com.au  Fact Sheet for Healthcare Providers: IncredibleEmployment.be  This test is not yet approved or cleared by the [place] FDA and has been authorized for detection and/or diagnosis of SARS-CoV-2 by FDA under an Emergency Use Authorization (EUA). This EUA will remain in effect (meaning this test can be used) for the duration of the COVID-19 declaration under Section 564(b)(1) of the Act, 21 U.S.C. section 360bbb-3(b)(1), unless the authorization is terminated or revoked.  Performed at [facility], [place] [address], [place], [place] [address]   Surgical pcr screen     Status: None   Collection Time: 04/03/21  8:37 AM   Specimen: Nasal Mucosa; Nasal Swab  Result Value Ref Range Status   MRSA, PCR NEGATIVE NEGATIVE Final   Staphylococcus aureus NEGATIVE NEGATIVE Final    Comment: (NOTE) The Xpert SA Assay (FDA approved for NASAL specimens in patients 27 years of age and older), is one component of a comprehensive surveillance program. It is not intended to diagnose infection nor to guide or monitor treatment. Performed at [facility], [place] [address], [place], [place] [address]       Radiology Studies: CT ABDOMEN PELVIS W CONTRAST  Result Date: 04/02/2021 CLINICAL DATA:  Abdominal distension EXAM: CT ABDOMEN AND PELVIS WITH CONTRAST TECHNIQUE: Multidetector CT imaging of the abdomen and pelvis was performed using the standard protocol following bolus administration of intravenous contrast. CONTRAST:  37m OMNIPAQUE IOHEXOL 350 MG/ML SOLN COMPARISON:  None. FINDINGS: Lower chest: The visualized lung bases are clear. Multi-vessel coronary artery stenting has been performed. Advanced coronary artery calcification noted. Global  cardiac size within normal limits. No pericardial effusion. Hepatobiliary: The liver is shrunken, the liver contour is nodular, and there is hypertrophy of the left and caudate lobes in keeping with changes of advanced cirrhosis. No enhancing intrahepatic mass is identified. No intra or extrahepatic biliary ductal dilation. There is interval thrombosis of the main and intrahepatic right and left portal veins with numerous peribiliary venous collaterals identified. Cholelithiasis without pericholecystic inflammatory change noted. Pancreas: Unremarkable Spleen: Stable splenomegaly with the spleen measuring 15.2 cm in greatest dimension. No intrasplenic lesions are seen. Adrenals/Urinary Tract: The adrenal glands are unremarkable. The kidneys are normal in position. Mild cortical scarring noted within the upper pole of the left kidney. The kidneys are otherwise unremarkable. The bladder is unremarkable. Stomach/Bowel: The stomach and small bowel are unremarkable. The appendix is absent. There is circumferential thickening of the cecum and ascending colon likely reflecting changes of portal colopathy. The colon is otherwise unremarkable. Mild ascites is present, similar to prior examination. No free intraperitoneal gas. Vascular/Lymphatic: Numerous gastroepiploic venous collaterals and a splenorenal shunt are identified. Moderate aortoiliac atherosclerotic calcification. No aortic aneurysm. No pathologic adenopathy within the abdomen and pelvis. Reproductive: Moderate prostatic enlargement again noted. Other: Tiny fat containing left inguinal hernia and small ascites  containing right inguinal hernia are identified. Ventral hernia repair with mesh has been performed. Musculoskeletal: No acute bone abnormality. Osseous structures are age-appropriate. IMPRESSION: Coronary artery calcification. Evidence of prior coronary artery stenting. Changes of advanced cirrhosis. Interval thrombosis of the portal vein with peribiliary  venous collateralization. Evidence of portal venous hypertension with portosystemic collateralization, progressive, moderate splenomegaly and mild, stable ascites. Cholelithiasis. Circumferential thickening of the cecum and ascending colon likely reflecting changes of portal colopathy. Infectious or inflammatory colitis is possible, but considered less likely. No evidence of obstruction or perforation. Moderate prostatic enlargement. Aortic Atherosclerosis (ICD10-I70.0). Electronically Signed   By: Fidela Salisbury M.D.   On: 04/02/2021 02:42   MR 3D Recon At Scanner  Result Date: 04/02/2021 CLINICAL DATA:  70 year old male presents with cirrhosis and reported portal vein thrombosis. EXAM: MRI ABDOMEN WITHOUT AND WITH CONTRAST (INCLUDING MRCP) TECHNIQUE: Multiplanar multisequence MR imaging of the abdomen was performed both before and after the administration of intravenous contrast. Heavily T2-weighted images of the biliary and pancreatic ducts were obtained, and three-dimensional MRCP images were rendered by post processing. CONTRAST:  91m GADAVIST GADOBUTROL 1 MMOL/ML IV SOLN COMPARISON:  April 02, 2021. FINDINGS: Lower chest: No effusion. No consolidative process. Limited assessment of the lung bases on MRI Hepatobiliary: Signs of hepatic cirrhosis as before. Cholelithiasis. No biliary duct dilation. Tiny biliary calculus in the distal common bile duct suspected on image 157/100, also seen on the coronal MRCP sequences (image 16/12) Small foci of hyperenhancement noted near the dome of the RIGHT hemi liver (image 7/19) 7 mm (Image 11/19) 8 mm. No additional focal abnormalities on arterial phase or areas of washout on venous phase. In aggregate these are LR category 3. Signs of portal vein thrombosis as seen on recent CT imaging in the main and LEFT portal vein. Suggestion of early cavernous transformation about the porta hepatis. Thrombus propagates into the SMV. The splenic vein is either diminutive or  occluded. Pancreas: Normal intrinsic T1 signal. No ductal dilation or sign of inflammation. No focal lesion. Spleen:  Spleen enlarged as before. Adrenals/Urinary Tract:  Adrenal glands are normal. No suspicious renal lesion though the kidneys are excluded from view on many of the axial sequences. No hydronephrosis. No perinephric stranding. Stomach/Bowel: Edema of the RIGHT colon partially visualized. No acute gastrointestinal process aside from this finding on limited assessment on abdominal MRI. Vascular/Lymphatic: Small splenorenal shunt. 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 Aorta with normal caliber. There is no gastrohepatic or hepatoduodenal ligament lymphadenopathy. No retroperitoneal or mesenteric lymphadenopathy. Other:  Small volume ascites is present about the RIGHT hemi liver. Musculoskeletal: No suspicious bone lesions identified. IMPRESSION: Portal venous thrombosis worse than in 2017 and with potential early cavernous transformation suggests there may be some chronicity. Acute on chronic thrombus is considered. Edema about the RIGHT colon, correlate with any symptoms that would suggest portal colopathy exacerbated by portal thrombosis. 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 Cholelithiasis with choledocholithiasis but without biliary duct dilation. Correlation with symptoms may be helpful. Signs of hepatic cirrhosis with splenomegaly and small volume ascites as on the previous CT. Small foci of hyperenhancement near the dome of the RIGHT hemi liver 7 mm. In aggregate these are LR category 3. Three to six-month follow-up is suggested for further evaluation. Small volume ascites about the RIGHT hemi liver. These results will be  called to the ordering clinician or representative by the Radiologist Assistant, and  communication documented in the PACS or [employer]. Electronically Signed   By: Zetta Bills M.D.   On: 04/02/2021 12:27   DG Chest Portable 1 View  Result Date: 04/02/2021 CLINICAL DATA:  Worsening cirrhosis and abdominal distension EXAM: PORTABLE CHEST 1 VIEW COMPARISON:  Abdominal CT from earlier today FINDINGS: Low volume chest with indistinct density at the bases attributed atelectasis. Borderline heart size for technique, accentuated by mediastinal fat. Coronary stenting. No Kerley lines, effusion, or pneumothorax. IMPRESSION: Low volume chest with mild atelectasis at the bases. Electronically Signed   By: Jorje Guild M.D.   On: 04/02/2021 05:27   MR ABDOMEN MRCP W WO CONTAST  Result Date: 04/02/2021 CLINICAL DATA:  70 year old male presents with cirrhosis and reported portal vein thrombosis. EXAM: MRI ABDOMEN WITHOUT AND WITH CONTRAST (INCLUDING MRCP) TECHNIQUE: Multiplanar multisequence MR imaging of the abdomen was performed both before and after the administration of intravenous contrast. Heavily T2-weighted images of the biliary and pancreatic ducts were obtained, and three-dimensional MRCP images were rendered by post processing. CONTRAST:  19m GADAVIST GADOBUTROL 1 MMOL/ML IV SOLN COMPARISON:  April 02, 2021. FINDINGS: Lower chest: No effusion. No consolidative process. Limited assessment of the lung bases on MRI Hepatobiliary: Signs of hepatic cirrhosis as before. Cholelithiasis. No biliary duct dilation. Tiny biliary calculus in the distal common bile duct suspected on image 157/100, also seen on the coronal MRCP sequences (image 16/12) Small foci of hyperenhancement noted near the dome of the RIGHT hemi liver (image 7/19) 7 mm (Image 11/19) 8 mm. No additional focal abnormalities on arterial phase or areas of washout on venous phase. In aggregate these are LR category 3. Signs of portal vein thrombosis as seen on recent CT imaging in the main and LEFT portal vein. Suggestion  of early cavernous transformation about the porta hepatis. Thrombus propagates into the SMV. The splenic vein is either diminutive or occluded. Pancreas: Normal intrinsic T1 signal. No ductal dilation or sign of inflammation. No focal lesion. Spleen:  Spleen enlarged as before. Adrenals/Urinary Tract:  Adrenal glands are normal. No suspicious renal lesion though the kidneys are excluded from view on many of the axial sequences. No hydronephrosis. No perinephric stranding. Stomach/Bowel: Edema of the RIGHT colon partially visualized. No acute gastrointestinal process aside from this finding on limited assessment on abdominal MRI. Vascular/Lymphatic: Small splenorenal shunt. 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 Aorta with normal caliber. There is no gastrohepatic or hepatoduodenal ligament lymphadenopathy. No retroperitoneal or mesenteric lymphadenopathy. Other:  Small volume ascites is present about the RIGHT hemi liver. Musculoskeletal: No suspicious bone lesions identified. IMPRESSION: Portal venous thrombosis worse than in 2017 and with potential early cavernous transformation suggests there may be some chronicity. Acute on chronic thrombus is considered. Edema about the RIGHT colon, correlate with any symptoms that would suggest portal colopathy exacerbated by portal thrombosis. 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 Cholelithiasis with choledocholithiasis but without biliary duct dilation. Correlation with symptoms may be helpful. Signs of hepatic cirrhosis with splenomegaly and small volume ascites as on the previous CT. Small foci of hyperenhancement near the dome of the RIGHT hemi liver 7 mm. In aggregate these are LR category 3. Three to six-month follow-up is suggested for further evaluation. Small volume ascites about  the  RIGHT hemi liver. These results will be called to the ordering clinician or representative by the Radiologist Assistant, and communication documented in the PACS or [employer]. Electronically Signed   By: Zetta Bills M.D.   On: 04/02/2021 12:27    Scheduled Meds:  carvedilol  3.125 mg Oral BID   cetirizine  5 mg Oral Daily   feeding supplement (GLUCERNA SHAKE)  237 mL Oral BID BM   furosemide  20 mg Oral BID   indomethacin  100 mg Rectal Once   insulin aspart  0-6 Units Subcutaneous TID WC   isosorbide mononitrate  90 mg Oral q morning   loratadine  10 mg Oral QHS   pantoprazole  40 mg Oral BID   sodium chloride flush  3 mL Intravenous Q12H   spironolactone  50 mg Oral Daily   Continuous Infusions:  heparin       LOS: 1 day   Time spent: 25 minutes.  Patrecia Pour, MD [facility] [email or web address] 04/03/2021, 4:07 PM

## 2021-04-03 NOTE — Progress Notes (Signed)
[place] for heparin Indication:  portal vein thrombosis  Allergies  Allergen Reactions   Atorvastatin Other (See Comments)    Unknown   Metoprolol Other (See Comments)    bradycardia   Penicillins Swelling    Swelling at injection site Has patient had a PCN reaction causing immediate rash, facial/tongue/throat swelling, SOB or lightheadedness with hypotension: No Has patient had a PCN reaction causing severe rash involving mucus membranes or skin necrosis: No Has patient had a PCN reaction that required hospitalization No Has patient had a PCN reaction occurring within the last 10 years: No If all of the above answers are "NO", then may proceed with Cephalosporin use.   Rosuvastatin Other (See Comments)    unknown   Simvastatin Other (See Comments)    unknown   Terazosin Other (See Comments)    Orthostatic hypotension    Patient Measurements: Height: 5' 11"  (180.3 cm) Weight: 82.1 kg (181 lb) IBW/kg (Calculated) : 75.3 Heparin Dosing Weight: 81.2 kg  Vital Signs: BP: 103/63 (09/27 2300) Pulse Rate: 73 (09/27 2300)  Labs: Recent Labs    04/01/21 2303 04/02/21 1224 04/02/21 2305  HGB 13.2  --   --   HCT 38.4*  --   --   PLT 211  --   --   APTT  --  38* 74*  LABPROT 17.9* 18.7*  --   INR 1.5* 1.6*  --   HEPARINUNFRC  --  0.13*  --   CREATININE 1.16  --   --      Estimated Creatinine Clearance: 62.2 mL/min (by C-G formula based on SCr of 1.16 mg/dL).   Medical History: Past Medical History:  Diagnosis Date   Arthritis    Coronary artery disease    Diabetes mellitus without complication (HCC)    GERD (gastroesophageal reflux disease)    food related only with spicy only   Headache    h/o as a child-migraines   History of hiatal hernia    Hypercholesteremia    Hypertension    Liver lesion    Primary cancer of kidney or ureter    KIDNEY CANCER   PTSD (post-traumatic stress disorder)    Thrombosis    OF LIVER  THAT CAUSED LIVER FAILURE-NOW ON RIFAXIMIN BID    Medications: see MAR  Assessment: 70 yo M with CAD with history of stent placement x2, diabetes mellitus type 2, nonalcoholic cirrhosis, 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  On PTA AC with Eliquis - compliant with last dose 9/26 morning per med rec tech.  Baseline CBC wnl  9/28 AM update:  aPTT therapeutic    Goal of Therapy:  Heparin level 0.3-0.7 units/mL aPTT 66-102 secs Monitor platelets by anticoagulation protocol: Yes   Plan:  Cont heparin at 1400 units/hr Heparin off at 0700 this AM for procedure  F/U heparin re-start s/p procedure  Narda Bonds, PharmD, BCPS Clinical Pharmacist Phone: [phone]

## 2021-04-04 ENCOUNTER — Other Ambulatory Visit (HOSPITAL_COMMUNITY): Payer: Self-pay

## 2021-04-04 DIAGNOSIS — E871 Hypo-osmolality and hyponatremia: Secondary | ICD-10-CM

## 2021-04-04 DIAGNOSIS — K7469 Other cirrhosis of liver: Secondary | ICD-10-CM | POA: Diagnosis not present

## 2021-04-04 DIAGNOSIS — K7581 Nonalcoholic steatohepatitis (NASH): Secondary | ICD-10-CM | POA: Diagnosis not present

## 2021-04-04 DIAGNOSIS — I81 Portal vein thrombosis: Secondary | ICD-10-CM | POA: Diagnosis not present

## 2021-04-04 DIAGNOSIS — N179 Acute kidney failure, unspecified: Secondary | ICD-10-CM

## 2021-04-04 DIAGNOSIS — R17 Unspecified jaundice: Secondary | ICD-10-CM | POA: Diagnosis not present

## 2021-04-04 LAB — HEPARIN LEVEL (UNFRACTIONATED)
Heparin Unfractionated: 0.21 IU/mL — ABNORMAL LOW (ref 0.30–0.70)
Heparin Unfractionated: 0.26 IU/mL — ABNORMAL LOW (ref 0.30–0.70)
Heparin Unfractionated: 0.38 IU/mL (ref 0.30–0.70)

## 2021-04-04 LAB — COMPREHENSIVE METABOLIC PANEL
ALT: 84 U/L — ABNORMAL HIGH (ref 0–44)
AST: 135 U/L — ABNORMAL HIGH (ref 15–41)
Albumin: 1.9 g/dL — ABNORMAL LOW (ref 3.5–5.0)
Alkaline Phosphatase: 193 U/L — ABNORMAL HIGH (ref 38–126)
Anion gap: 10 (ref 5–15)
BUN: 36 mg/dL — ABNORMAL HIGH (ref 8–23)
CO2: 16 mmol/L — ABNORMAL LOW (ref 22–32)
Calcium: 8.6 mg/dL — ABNORMAL LOW (ref 8.9–10.3)
Chloride: 101 mmol/L (ref 98–111)
Creatinine, Ser: 1.8 mg/dL — ABNORMAL HIGH (ref 0.61–1.24)
GFR, Estimated: 40 mL/min — ABNORMAL LOW (ref >60)
Glucose, Bld: 367 mg/dL — ABNORMAL HIGH (ref 70–99)
Potassium: 4.8 mmol/L (ref 3.5–5.1)
Sodium: 127 mmol/L — ABNORMAL LOW (ref 135–145)
Total Bilirubin: 10.1 mg/dL — ABNORMAL HIGH (ref 0.3–1.2)
Total Protein: 6.5 g/dL (ref 6.5–8.1)

## 2021-04-04 LAB — PROTIME-INR
INR: 2 — ABNORMAL HIGH (ref 0.8–1.2)
Prothrombin Time: 22.8 seconds — ABNORMAL HIGH (ref 11.4–15.2)

## 2021-04-04 LAB — BPAM FFP
Blood Product Expiration Date: 202210012359
Blood Product Expiration Date: 202210032359
ISSUE DATE / TIME: 202209280933
ISSUE DATE / TIME: 202209281126
Unit Type and Rh: 7300
Unit Type and Rh: 8400

## 2021-04-04 LAB — PREPARE FRESH FROZEN PLASMA
Unit division: 0
Unit division: 0

## 2021-04-04 LAB — PROTEIN / CREATININE RATIO, URINE
Creatinine, Urine: 98.33 mg/dL
Protein Creatinine Ratio: 0.44 mg/mg{Cre} — ABNORMAL HIGH (ref 0.00–0.15)
Total Protein, Urine: 43 mg/dL

## 2021-04-04 LAB — CBC
HCT: 32.6 % — ABNORMAL LOW (ref 39.0–52.0)
HCT: 33.8 % — ABNORMAL LOW (ref 39.0–52.0)
Hemoglobin: 11.5 g/dL — ABNORMAL LOW (ref 13.0–17.0)
Hemoglobin: 11.7 g/dL — ABNORMAL LOW (ref 13.0–17.0)
MCH: 32.4 pg (ref 26.0–34.0)
MCH: 32 pg (ref 26.0–34.0)
MCHC: 35.3 g/dL (ref 30.0–36.0)
MCHC: 34.6 g/dL (ref 30.0–36.0)
MCV: 91.8 fL (ref 80.0–100.0)
MCV: 92.3 fL (ref 80.0–100.0)
Platelets: 111 10*3/uL — ABNORMAL LOW (ref 150–400)
Platelets: 134 10*3/uL — ABNORMAL LOW (ref 150–400)
RBC: 3.55 MIL/uL — ABNORMAL LOW (ref 4.22–5.81)
RBC: 3.66 MIL/uL — ABNORMAL LOW (ref 4.22–5.81)
RDW: 15.9 % — ABNORMAL HIGH (ref 11.5–15.5)
RDW: 15.9 % — ABNORMAL HIGH (ref 11.5–15.5)
WBC: 6.3 10*3/uL (ref 4.0–10.5)
WBC: 9.2 10*3/uL (ref 4.0–10.5)
nRBC: 0 % (ref 0.0–0.2)
nRBC: 0 % (ref 0.0–0.2)

## 2021-04-04 LAB — SODIUM, URINE, RANDOM: Sodium, Ur: 17 mmol/L

## 2021-04-04 LAB — GLUCOSE, CAPILLARY
Glucose-Capillary: 66 mg/dL — ABNORMAL LOW (ref 70–99)
Glucose-Capillary: 369 mg/dL — ABNORMAL HIGH (ref 70–99)
Glucose-Capillary: 333 mg/dL — ABNORMAL HIGH (ref 70–99)
Glucose-Capillary: 247 mg/dL — ABNORMAL HIGH (ref 70–99)
Glucose-Capillary: 281 mg/dL — ABNORMAL HIGH (ref 70–99)
Glucose-Capillary: 272 mg/dL — ABNORMAL HIGH (ref 70–99)

## 2021-04-04 LAB — AFP TUMOR MARKER: AFP, Serum, Tumor Marker: 1.9 ng/mL (ref 0.0–8.4)

## 2021-04-04 LAB — APTT
aPTT: 160 seconds — ABNORMAL HIGH (ref 24–36)
aPTT: 187 seconds (ref 24–36)

## 2021-04-04 MED ORDER — ENSURE ENLIVE PO LIQD
237.0000 mL | Freq: Three times a day (TID) | ORAL | Status: DC
  Administered 2021-04-04 – 2021-04-12 (×19): 237 mL via ORAL

## 2021-04-04 MED ORDER — ALBUMIN HUMAN 25 % IV SOLN
50.0000 g | Freq: Once | INTRAVENOUS | Status: AC
  Administered 2021-04-04: 50 g via INTRAVENOUS
  Filled 2021-04-04: qty 200

## 2021-04-04 MED ORDER — LACTULOSE 10 GM/15ML PO SOLN
20.0000 g | Freq: Two times a day (BID) | ORAL | Status: DC
  Administered 2021-04-04 – 2021-04-06 (×4): 20 g via ORAL
  Filled 2021-04-04 (×5): qty 30

## 2021-04-04 MED ORDER — INSULIN ASPART 100 UNIT/ML IJ SOLN
0.0000 [IU] | Freq: Every day | INTRAMUSCULAR | Status: DC
  Administered 2021-04-04: 3 [IU] via SUBCUTANEOUS
  Administered 2021-04-06 – 2021-04-08 (×2): 2 [IU] via SUBCUTANEOUS
  Administered 2021-04-09: 3 [IU] via SUBCUTANEOUS
  Administered 2021-04-10: 2 [IU] via SUBCUTANEOUS

## 2021-04-04 MED ORDER — INSULIN ASPART 100 UNIT/ML IJ SOLN
0.0000 [IU] | Freq: Three times a day (TID) | INTRAMUSCULAR | Status: DC
  Administered 2021-04-04: 5 [IU] via SUBCUTANEOUS
  Administered 2021-04-04 – 2021-04-05 (×2): 3 [IU] via SUBCUTANEOUS

## 2021-04-04 MED ORDER — ADULT MULTIVITAMIN W/MINERALS CH
1.0000 | ORAL_TABLET | Freq: Every day | ORAL | Status: DC
  Administered 2021-04-04 – 2021-04-12 (×9): 1 via ORAL
  Filled 2021-04-04 (×9): qty 1

## 2021-04-04 MED ORDER — SODIUM CHLORIDE 0.9 % IV BOLUS
1000.0000 mL | Freq: Once | INTRAVENOUS | Status: AC
  Administered 2021-04-04: 1000 mL via INTRAVENOUS

## 2021-04-04 MED ORDER — DIPHENHYDRAMINE HCL 25 MG PO CAPS
25.0000 mg | ORAL_CAPSULE | Freq: Four times a day (QID) | ORAL | Status: DC | PRN
  Administered 2021-04-09 – 2021-04-12 (×2): 25 mg via ORAL
  Filled 2021-04-04 (×2): qty 1

## 2021-04-04 NOTE — Progress Notes (Signed)
Initial Nutrition Assessment  DOCUMENTATION CODES:   Non-severe (moderate) malnutrition in context of chronic illness  INTERVENTION:   -Ensure Enlive po TID, each supplement provides 350 kcal and 20 grams of protein  -MVI with minerals daily -Feeding assistance with meals -Reviewed menu with pt and obtained meal preferences -Lactaid milk at all meals  NUTRITION DIAGNOSIS:   Moderate Malnutrition related to chronic illness (non-alcoholic cirrhosis) as evidenced by moderate fat depletion, moderate muscle depletion.  GOAL:   Patient will meet greater than or equal to 90% of their needs  MONITOR:   PO intake, Supplement acceptance, Labs, Weight trends, Skin, I & O's  REASON FOR ASSESSMENT:   Consult Assessment of nutrition requirement/status  ASSESSMENT:   Jerry [name] is a 70 y.o. 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  Pt admitted with jaundice, choledocholithiasis, trasaminitis, and hyperbilirubinemia.   9/28- s/p ERCP- revealed large esophageal varicies, relatively small major papilla, nondilated biliary tree s/p biliary sphincterotomy  Reviewed I/O's: +699 ml x 24 hours and +277 ml since admission  UOP: 475 ml x 24 hours  Spoke with pt, who was sitting in recliner chair at time of visit. He reports feeling about the same. He shares that he has experienced a general decline in health over the past 2 years secondary to multiple medical issues. He has been having a very poor appetite secondary to nausea; hot food odors often triggers this. Per pt, he usually grazes throughout the day and frequently  consumed foods are cinnamon chex cereal with fruit, sandwiches and peanut butter crackers. Observed breakfast tray; pt consumed milk and applesauce off of tray. He shares he also consumed a pancake earlier. He shares he is very picky about what he eats to help control abdominal distention. He usually has about 2 bowel movements per day, but also takes miralax when the distention worsens.   Per pt, his UBW is around 181#. He reports his weight rises to 189# when he gets distended. Noted distant history of weight loss per wt hx.   Discussed importance of good meal and supplement intake to promote healing. Pt reports consuming Ensure supplements PTA and is amenable to continue these in the hospital. Pt has difficulty chewing due to dentures, however, politely declined offer of mechanically altered diet as pt has ordered several meals based upon what foods he thinks he can tolerate. He requires assistance with tray set up and opening packages secondary to weakness.   Pt with poor oral intake and would benefit from nutrient dense supplement. One Ensure Enlive supplement provides 350 kcals, 20 grams protein, and 44-45 grams of carbohydrate vs one Glucerna shake supplement, which provides 220 kcals, 10 grams of protein, and 26 grams of carbohydrate. Given pt's hx of DM, RD will reassess adequacy of PO intake, CBGS, and adjust supplement regimen as appropriate at follow-up.    Lab Results  Component Value Date   HGBA1C 8.8 (H) 04/02/2021   PTA DM medications are 5 mg glipizide BID and 500 mg metformin BID.   Labs reviewed: Na: 127, CBGS: 333-430 (inpatient orders for glycemic control are 0-5 units insulin asaprt daily at bedtime and 0-9 units insulin aspart  TID with meals).    NUTRITION - FOCUSED PHYSICAL EXAM:  Flowsheet Row Most Recent Value  Orbital Region Moderate depletion  Upper Arm Region Moderate depletion  Thoracic and Lumbar Region Mild depletion  Buccal Region Moderate depletion  Temple  Region Moderate depletion  Clavicle Bone Region Moderate depletion  Clavicle and Acromion Bone Region Moderate depletion  Scapular Bone Region Moderate depletion  Dorsal Hand Moderate depletion  Patellar Region Moderate depletion  Anterior Thigh Region Moderate depletion  Posterior Calf Region Moderate depletion  Edema (RD Assessment) None  Hair Reviewed  Eyes Reviewed  Mouth Reviewed  Skin Reviewed  Nails Reviewed       Diet Order:   Diet Order             Diet heart healthy/carb modified Room service appropriate? Yes; Fluid consistency: Thin  Diet effective now                   EDUCATION NEEDS:   Education needs have been addressed  Skin:  Skin Assessment: Reviewed RN Assessment  Last BM:  04/03/21  Height:   Ht Readings from Last 1 Encounters:  04/03/21 5' 11"  (1.803 m)    Weight:   Wt Readings from Last 1 Encounters:  04/03/21 82.1 kg    Ideal Body Weight:  78.2 kg  BMI:  Body mass index is 25.24 kg/m.  Estimated Nutritional Needs:   Kcal:  6811-5726  Protein:  120-135 grams  Fluid:  > 2 L    Loistine Chance, RD, LDN, [place] Registered Dietitian II Certified Diabetes Care and Education Specialist Please refer to [facility] for RD and/or RD on-call/weekend/after hours pager

## 2021-04-04 NOTE — Progress Notes (Signed)
[facility] Progress Note  CC:  Elevated LFTs, cirrhosis, abdominal pain  Subjective:  Sitting up in the chair eating some applesauce.  No new complaints today.  Seems that maybe his abdominal pain is at baseline.  Objective:  Vital signs in last 24 hours: Temp:  [97.3 F (36.3 C)-98 F (36.7 C)] 97.5 F (36.4 C) (09/29 0748) Pulse Rate:  [52-61] 55 (09/29 0748) Resp:  [13-18] 15 (09/29 0748) BP: (116-147)/(62-75) 131/70 (09/29 0748) SpO2:  [98 %-100 %] 99 % (09/29 0748) Weight:  [82.1 kg] 82.1 kg (09/28 1246) Last BM Date: 04/03/21 General:  Alert, chronically ill-appearing, in NAD; jaundice noted Heart:  Slightly bradycardic; no murmurs Pulm:  CTAB.  No W/R/R. Abdomen:  Soft, non-distended.  BS present.  Non-tender. Extremities:  Without edema. Neurologic:  Alert and oriented x 4;  grossly normal neurologically.  No asterixis noted.  Intake/Output from previous day: 09/28 0701 - 09/29 0700 In: 1173.5 [I.V.:351.5; Blood:622; IV Piggyback:200] Out: 475 [Urine:475] Intake/Output this shift: Total I/O In: -  Out: 500 [Urine:500]  Lab Results: Recent Labs    04/01/21 2303 04/04/21 0541 04/04/21 0815  WBC 10.6* 6.3 9.2  HGB 13.2 11.5* 11.7*  HCT 38.4* 32.6* 33.8*  PLT 211 111* 134*   BMET Recent Labs    04/01/21 2303 04/03/21 0325 04/04/21 0541  NA 132* 133* 127*  K 4.3 4.5 4.8  CL 107 108 101  CO2 19* 19* 16*  GLUCOSE 77 73 367*  BUN 27* 22 36*  CREATININE 1.16 1.37* 1.80*  CALCIUM 9.0 8.5* 8.6*   LFT Recent Labs    04/02/21 0546 04/03/21 0325 04/04/21 0541  PROT 6.8   < > 6.5  ALBUMIN 1.9*   < > 1.9*  AST 138*   < > 135*  ALT 92*   < > 84*  ALKPHOS 208*   < > 193*  BILITOT 10.0*   < > 10.1*  BILIDIR 5.9*  --   --   IBILI 4.1*  --   --    < > = values in this interval not displayed.   PT/INR Recent Labs    04/03/21 1518 04/04/21 0541  LABPROT 19.3* 22.8*  INR 1.6* 2.0*   Hepatitis Panel Recent Labs     04/02/21 0546  HEPBSAG NON REACTIVE  HCVAB NON REACTIVE  HEPAIGM NON REACTIVE  HEPBIGM NON REACTIVE    MR 3D Recon At Scanner  Result Date: 04/02/2021 CLINICAL DATA:  70 year old male presents with cirrhosis and reported portal vein thrombosis. EXAM: MRI ABDOMEN WITHOUT AND WITH CONTRAST (INCLUDING MRCP) TECHNIQUE: Multiplanar multisequence MR imaging of the abdomen was performed both before and after the administration of intravenous contrast. Heavily T2-weighted images of the biliary and pancreatic ducts were obtained, and three-dimensional MRCP images were rendered by post processing. CONTRAST:  35m GADAVIST GADOBUTROL 1 MMOL/ML IV SOLN COMPARISON:  April 02, 2021. FINDINGS: Lower chest: No effusion. No consolidative process. Limited assessment of the lung bases on MRI Hepatobiliary: Signs of hepatic cirrhosis as before. Cholelithiasis. No biliary duct dilation. Tiny biliary calculus in the distal common bile duct suspected on image 157/100, also seen on the coronal MRCP sequences (image 16/12) Small foci of hyperenhancement noted near the dome of the RIGHT hemi liver (image 7/19) 7 mm (Image 11/19) 8 mm. No additional focal abnormalities on arterial phase or areas of washout on venous phase. In aggregate these are LR category 3. Signs of portal vein thrombosis as seen on recent CT imaging  in the main and LEFT portal vein. Suggestion of early cavernous transformation about the porta hepatis. Thrombus propagates into the SMV. The splenic vein is either diminutive or occluded. Pancreas: Normal intrinsic T1 signal. No ductal dilation or sign of inflammation. No focal lesion. Spleen:  Spleen enlarged as before. Adrenals/Urinary Tract:  Adrenal glands are normal. No suspicious renal lesion though the kidneys are excluded from view on many of the axial sequences. No hydronephrosis. No perinephric stranding. Stomach/Bowel: Edema of the RIGHT colon partially visualized. No acute gastrointestinal process  aside from this finding on limited assessment on abdominal MRI. Vascular/Lymphatic: Small splenorenal shunt. 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 Aorta with normal caliber. There is no gastrohepatic or hepatoduodenal ligament lymphadenopathy. No retroperitoneal or mesenteric lymphadenopathy. Other:  Small volume ascites is present about the RIGHT hemi liver. Musculoskeletal: No suspicious bone lesions identified. IMPRESSION: Portal venous thrombosis worse than in 2017 and with potential early cavernous transformation suggests there may be some chronicity. Acute on chronic thrombus is considered. Edema about the RIGHT colon, correlate with any symptoms that would suggest portal colopathy exacerbated by portal thrombosis. 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 Cholelithiasis with choledocholithiasis but without biliary duct dilation. Correlation with symptoms may be helpful. Signs of hepatic cirrhosis with splenomegaly and small volume ascites as on the previous CT. Small foci of hyperenhancement near the dome of the RIGHT hemi liver 7 mm. In aggregate these are LR category 3. Three to six-month follow-up is suggested for further evaluation. Small volume ascites about the RIGHT hemi liver. These results will be called to the ordering clinician or representative by the Radiologist Assistant, and communication documented in the PACS or [employer]. Electronically Signed   By: Zetta Bills M.D.   On: 04/02/2021 12:27   DG ERCP  Result Date: 04/03/2021 CLINICAL DATA:  70 year old male with ERCP EXAM: ERCP TECHNIQUE: Multiple spot images obtained with the fluoroscopic device and submitted for interpretation post-procedure. FLUOROSCOPY TIME:  Fluoroscopy Time:  1 minute 35 seconds COMPARISON:  MR 04/02/2021 FINDINGS: Limited  intraoperative fluoroscopic spot images during ERCP. Initial image demonstrates the endoscope projecting over the upper abdomen with safety wire within the extrahepatic biliary ducts. There is subsequently retrograde infusion of contrast with partial opacification. Passage of a balloon retrieval catheter. IMPRESSION: Limited images during ERCP demonstrates deployment of balloon retrieval catheter for treatment of choledocholithiasis. Please refer to the dictated operative report for full details of intraoperative findings and procedure. Electronically Signed   By: Corrie Mckusick D.O.   On: 04/03/2021 16:44   MR ABDOMEN MRCP W WO CONTAST  Result Date: 04/02/2021 CLINICAL DATA:  70 year old male presents with cirrhosis and reported portal vein thrombosis. EXAM: MRI ABDOMEN WITHOUT AND WITH CONTRAST (INCLUDING MRCP) TECHNIQUE: Multiplanar multisequence MR imaging of the abdomen was performed both before and after the administration of intravenous contrast. Heavily T2-weighted images of the biliary and pancreatic ducts were obtained, and three-dimensional MRCP images were rendered by post processing. CONTRAST:  52m GADAVIST GADOBUTROL 1 MMOL/ML IV SOLN COMPARISON:  April 02, 2021. FINDINGS: Lower chest: No effusion. No consolidative process. Limited assessment of the lung bases on MRI Hepatobiliary: Signs of hepatic cirrhosis as before. Cholelithiasis. No biliary duct dilation. Tiny biliary calculus in the distal common bile duct suspected on image 157/100, also seen on the coronal MRCP sequences (image 16/12)  Small foci of hyperenhancement noted near the dome of the RIGHT hemi liver (image 7/19) 7 mm (Image 11/19) 8 mm. No additional focal abnormalities on arterial phase or areas of washout on venous phase. In aggregate these are LR category 3. Signs of portal vein thrombosis as seen on recent CT imaging in the main and LEFT portal vein. Suggestion of early cavernous transformation about the porta hepatis.  Thrombus propagates into the SMV. The splenic vein is either diminutive or occluded. Pancreas: Normal intrinsic T1 signal. No ductal dilation or sign of inflammation. No focal lesion. Spleen:  Spleen enlarged as before. Adrenals/Urinary Tract:  Adrenal glands are normal. No suspicious renal lesion though the kidneys are excluded from view on many of the axial sequences. No hydronephrosis. No perinephric stranding. Stomach/Bowel: Edema of the RIGHT colon partially visualized. No acute gastrointestinal process aside from this finding on limited assessment on abdominal MRI. Vascular/Lymphatic: Small splenorenal shunt. 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 Aorta with normal caliber. There is no gastrohepatic or hepatoduodenal ligament lymphadenopathy. No retroperitoneal or mesenteric lymphadenopathy. Other:  Small volume ascites is present about the RIGHT hemi liver. Musculoskeletal: No suspicious bone lesions identified. IMPRESSION: Portal venous thrombosis worse than in 2017 and with potential early cavernous transformation suggests there may be some chronicity. Acute on chronic thrombus is considered. Edema about the RIGHT colon, correlate with any symptoms that would suggest portal colopathy exacerbated by portal thrombosis. 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 Cholelithiasis with choledocholithiasis but without biliary duct dilation. Correlation with symptoms may be helpful. Signs of hepatic cirrhosis with splenomegaly and small volume ascites as on the previous CT. Small foci of hyperenhancement near the dome of the RIGHT hemi liver 7 mm. In aggregate these are LR category 3. Three to six-month follow-up is suggested for further evaluation. Small volume ascites about the RIGHT hemi liver. These results will be called to the  ordering clinician or representative by the Radiologist Assistant, and communication documented in the PACS or [employer]. Electronically Signed   By: Zetta Bills M.D.   On: 04/02/2021 12:27    Assessment / Plan: *70 year old male with known nonalcohol related cirrhosis who follows at the [place] who presented with abdominal pain and elevated LFTs, particularly increasing total bilirubin at 10.8.  CT scan showed changes of cirrhosis and cholelithiasis.  Question if these symptoms and findings are suggestive of decompensation of his cirrhosis and some acute component of PVT versus biliary obstruction.  MRCP did in fact show choledocholithiasis, but no ductal dilatation.  Viral hepatitis panel is negative.  ERCP did not show any CBD stones, but rather an air bubble.  Suspect that his worsening numbers are representative of decompensation of his liver disease with a MELD of 33 today. *Coagulopathy: INR 2.0 up from 1.6. *Known chronic portal vein thrombosis on chronic Eliquis, but question if there is an acute component as well so ? If this could be contributing to his pain.  He has been placed on heparin drip here. *Liver lesion:  Recommend 3 to 32-monthfollow-up imaging.  AFP normal at 1.9. *Large esophageal varices (without signs of recent or impending bleeding) and moderate portal gastropathy.  Is on carvedilol already, which can suffice. *Hyponatremia:  Na+ is 127 this AM *AKI with Cr up to 1.8 today.  ? HRS.  -Check a urine sodium. -  Trend labs. -Anticoagulation will be tricky with PVT and large esophageal varices.  On heparin gtt for now. -Will give 50 grams of albumin and a liter bolus of normal saline to see if his renal function responds to that.   LOS: 2 days   Laban Emperor. Cyanna Neace  04/04/2021, 10:32 AM

## 2021-04-04 NOTE — Progress Notes (Signed)
Physical Therapy Treatment Patient Details Name: Jerry [name] MRN: [id number] DOB: [birth year] Today's Date: 04/04/2021   History of Present Illness 70 y.o. male admitted on 04/01/21 for abdominal pain, jaundice, and dizziness.  Pt s/p ERCP on 9/28.  Pt dx with hyperbilirubinemia, non-alcoholic cirrhosis, 82m R liver lesion, coagulopathy s/p 2u FFP, dizziness, acute hepatic encephalopathy, hypoalbuminemia, severe protein calorie malnutrition.  Pt with significant PMH of DM, HTN, portal vein thrombosis, CAD, HTN, PTSD, paracentesis, L reverse TSA.    PT Comments    Pt admitted with above diagnosis. Performed a canalith repositioning as suspect that pt with right anterior canal BPPV and right hypofunction.  Pt was able to pivot to the recliner from the bed with mod assist to stand and cues for focusing on the target "A" to decr dizziness. Pt states that he has no assist once he d/c's and definitely needed assist today with dizziness hindering mobility considerably. Recommend SNF for rehab so that vestibular issues can be addressed as well as weakness.  Will follow acutely. Pt currently with functional limitations due to balance and endurance deficits. Pt will benefit from skilled PT to increase their independence and safety with mobility to allow discharge to the venue listed below.      Recommendations for follow up therapy are one component of a multi-disciplinary discharge planning process, led by the attending physician.  Recommendations may be updated based on patient status, additional functional criteria and insurance authorization.  Follow Up Recommendations  Other (comment);SNF;Supervision/Assistance - 24 hour (vestibular PT)     Equipment Recommendations  Rolling walker with 5" wheels    Recommendations for Other Services       Precautions / Restrictions Precautions Precautions: Fall Restrictions Weight Bearing Restrictions: No     Mobility  Bed Mobility Overal bed mobility:  Needs Assistance Bed Mobility: Supine to Sit;Sit to Supine     Supine to sit: Min assist Sit to supine: Min assist   General bed mobility comments: Assessed with hallpike dix.  Pt appeared to have symptoms for right  anterior canal BPPV therefore treated for this with canalith repositioning.  Pt also with possible right hypofunction with further testing at EOB.  Did not want to initiate x 1 exercises as just performed canalith repositioning.  Will further assess and treat at next session.  Min assist to support trunk to come to sitting EOB, slow moving, extra time needed to complete and scoot to edge, min assist to help trunk come back to bed, supine.  Pt also encouarged to use target "A" placed in front of him for compensation and did well using compensation strategies.    Transfers Overall transfer level: Needs assistance Equipment used: Rolling walker (2 wheeled) Transfers: Sit to/from SOmnicareSit to Stand: Mod assist;From elevated surface Stand pivot transfers: Min assist;From elevated surface       General transfer comment: Pt needed mod asist to power up with cues for hand placement.  Pt able to take pivotal steps even though he did have shakiness in UEs and LEs once sitting/standing.  Took incr time to step to the recliner.  Ambulation/Gait                 Stairs             Wheelchair Mobility    Modified Rankin (Stroke Patients Only)       Balance Overall balance assessment: Mild deficits observed, not formally tested  Cognition Arousal/Alertness: Awake/alert Behavior During Therapy: WFL for tasks assessed/performed Overall Cognitive Status: Impaired/Different from baseline Area of Impairment: Problem solving                             Problem Solving: Slow processing        Exercises      General Comments        Pertinent Vitals/Pain Pain  Assessment: Faces Faces Pain Scale: Hurts little more Pain Location: generalized Pain Descriptors / Indicators: Grimacing;Guarding;Aching Pain Intervention(s): Limited activity within patient's tolerance;Monitored during session;Repositioned    Home Living                      Prior Function            PT Goals (current goals can now be found in the care plan section) Acute Rehab PT Goals Patient Stated Goal: to feel better Progress towards PT goals: Progressing toward goals    Frequency    Min 4X/week (secondary to vestibular)      PT Plan Discharge plan needs to be updated    Co-evaluation              AM-PAC PT "6 Clicks" Mobility   Outcome Measure  Help needed turning from your back to your side while in a flat bed without using bedrails?: A Little Help needed moving from lying on your back to sitting on the side of a flat bed without using bedrails?: A Little Help needed moving to and from a bed to a chair (including a wheelchair)?: A Lot Help needed standing up from a chair using your arms (e.g., wheelchair or bedside chair)?: A Lot Help needed to walk in hospital room?: A Lot Help needed climbing 3-5 steps with a railing? : A Lot 6 Click Score: 14    End of Session Equipment Utilized During Treatment: Gait belt Activity Tolerance: Patient limited by fatigue;Other (comment) (by nausea) Patient left: with call bell/phone within reach;in chair;with chair alarm set Nurse Communication: Mobility status PT Visit Diagnosis: Muscle weakness (generalized) (M62.81);Dizziness and giddiness (R42);Difficulty in walking, not elsewhere classified (R26.2)     Time: 3646-8032 PT Time Calculation (min) (ACUTE ONLY): 34 min  Charges:  $Therapeutic Activity: 8-22 mins $Canalith Rep Proc: 8-22 mins                     Keana Dueitt M,PT Acute Rehab Services [phone] (pager)    Alvira Philips 04/04/2021, 10:12 AM

## 2021-04-04 NOTE — Evaluation (Signed)
Occupational Therapy Evaluation Patient Details Name: Jerry [name] MRN: [id number] DOB: [birth year] Today's Date: 04/04/2021   History of Present Illness 70 y.o. male admitted on 04/01/21 for abdominal pain, jaundice, and dizziness.  Pt s/p ERCP on 9/28.  Pt dx with hyperbilirubinemia, non-alcoholic cirrhosis, 56m R liver lesion, coagulopathy s/p 2u FFP, dizziness, acute hepatic encephalopathy, hypoalbuminemia, severe protein calorie malnutrition.  Pt with significant PMH of DM, HTN, portal vein thrombosis, CAD, HTN, PTSD, paracentesis, L reverse TSA.   Clinical Impression   PTA patient independent with ADLs, mobility and driving. Admitted for above and presenting with problem list below, including dizziness, generalized weakness and decreased activity tolerance.  Patient presents with slow processing, but able to recall safety precautions/recommendations to use "A" from PT session.  He requires mod assist for sit to stand, limited tolerance due to dizziness. Up to max assist for LB Adls. Believe he will benefit from further OT services while  admitted and after dc at SNF level to optimize independence and safety with Adls, mobility.     Recommendations for follow up therapy are one component of a multi-disciplinary discharge planning process, led by the attending physician.  Recommendations may be updated based on patient status, additional functional criteria and insurance authorization.   Follow Up Recommendations  SNF;Supervision/Assistance - 24 hour    Equipment Recommendations  3 in 1 bedside commode    Recommendations for Other Services       Precautions / Restrictions Precautions Precautions: Fall Restrictions Weight Bearing Restrictions: No      Mobility Bed Mobility               General bed mobility comments: OOB in recliner upon entry    Transfers Overall transfer level: Needs assistance Equipment used: Rolling walker (2 wheeled) Transfers: Sit to/from  Stand Sit to Stand: Mod assist;From elevated surface         General transfer comment: pt with mod assist to power up, good recall of hand placement but dizzy in standing. Cueing to focus on "A" in room but unable to tolerate    Balance Overall balance assessment: Needs assistance Sitting-balance support: Feet supported Sitting balance-Leahy Scale: Fair Sitting balance - Comments: limited dynamic due to dizziness   Standing balance support: Bilateral upper extremity supported;During functional activity Standing balance-Leahy Scale: Poor Standing balance comment: relies on BUE support                           ADL either performed or assessed with clinical judgement   ADL Overall ADL's : Needs assistance/impaired     Grooming: Set up;Sitting   Upper Body Bathing: Set up;Sitting   Lower Body Bathing: Moderate assistance;Sit to/from stand Lower Body Bathing Details (indicate cue type and reason): requires assist for BLEs, min assist in standing Upper Body Dressing : Set up;Sitting   Lower Body Dressing: Maximal assistance;Sit to/from stand Lower Body Dressing Details (indicate cue type and reason): requires assist for donning socks, reprots increased dizziness with forward lean; mod assist sit to stand and relies on BUE support             Functional mobility during ADLs: Moderate assistance;Rolling walker;Cueing for safety;Cueing for sequencing General ADL Comments: pt limited by dizziness, decreased activity toelrance and weakness     Vision   Additional Comments: denies visual changes, noted nystagmus with scanning-- has "A" in room to focus gaze     Perception     Praxis  Pertinent Vitals/Pain Pain Assessment: Faces Faces Pain Scale: Hurts little more Pain Location: generalized Pain Descriptors / Indicators: Grimacing;Guarding;Aching Pain Intervention(s): Limited activity within patient's tolerance;Monitored during session;Repositioned      Hand Dominance Right   Extremity/Trunk Assessment Upper Extremity Assessment Upper Extremity Assessment: Generalized weakness   Lower Extremity Assessment Lower Extremity Assessment: Defer to PT evaluation       Communication Communication Communication: HOH   Cognition Arousal/Alertness: Awake/alert Behavior During Therapy: WFL for tasks assessed/performed Overall Cognitive Status: Impaired/Different from baseline Area of Impairment: Problem solving                             Problem Solving: Slow processing;Requires verbal cues General Comments: pt with slow processing, requires increased time   General Comments  limited by dizziness    Exercises     Shoulder Instructions      Home Living Family/patient expects to be discharged to:: Private residence Living Arrangements: Alone Available Help at Discharge: Friend(s);Available PRN/intermittently (significant other) Type of Home: House Home Access: Stairs to enter [employer] of Steps: 3 Entrance Stairs-Rails: None Home Layout: One level     Bathroom Shower/Tub: Tub/shower unit;Door   Bathroom Toilet: Handicapped height     Home Equipment: None          Prior Functioning/Environment Level of Independence: Independent        Comments: reports independent for ADLs, IADLs (driving)        OT Problem List: Decreased strength;Decreased activity tolerance;Impaired balance (sitting and/or standing);Pain;Decreased knowledge of precautions;Decreased knowledge of use of DME or AE;Decreased safety awareness      OT Treatment/Interventions: Self-care/ADL training;Therapeutic exercise;DME and/or AE instruction;Therapeutic activities;Balance training;Patient/family education    OT Goals(Current goals can be found in the care plan section) Acute Rehab OT Goals Patient Stated Goal: to feel better OT Goal Formulation: With patient Time For Goal Achievement: 04/18/21 Potential to  Achieve Goals: Good  OT Frequency: Min 2X/week   Barriers to D/C:            Co-evaluation              AM-PAC OT "6 Clicks" Daily Activity     Outcome Measure Help from another person eating meals?: None Help from another person taking care of personal grooming?: A Little Help from another person toileting, which includes using toliet, bedpan, or urinal?: A Lot Help from another person bathing (including washing, rinsing, drying)?: A Lot Help from another person to put on and taking off regular upper body clothing?: A Little Help from another person to put on and taking off regular lower body clothing?: A Lot 6 Click Score: 16   End of Session Equipment Utilized During Treatment: Rolling walker Nurse Communication: Mobility status  Activity Tolerance: Patient tolerated treatment well Patient left: in chair;with call bell/phone within reach;with chair alarm set  OT Visit Diagnosis: Other abnormalities of gait and mobility (R26.89);Muscle weakness (generalized) (M62.81);Pain;Dizziness and giddiness (R42) Pain - part of body:  (generalized)                Time: 7001-7494 OT Time Calculation (min): 15 min Charges:  OT General Charges $OT Visit: 1 Visit OT Evaluation $OT Eval Moderate Complexity: 1 Mod  Jolaine Artist, OT Acute Rehabilitation Services Pager [phone] Office [phone]   Delight Stare 04/04/2021, 1:37 PM

## 2021-04-04 NOTE — TOC Initial Note (Signed)
Transition of Care [facility]) - Initial/Assessment Note    Patient Details  Name: Jerry [name] MRN: [id number] Date of Birth: 1950/12/14  Transition of Care [facility]) CM/SW Contact:    Milinda Antis, [place] Phone Number: 04/04/2021, 4:11 PM  Clinical Narrative:                 CSW received consult for possible SNF placement at time of discharge. CSW spoke with patient. Patient expressed understanding of PT recommendation and is agreeable to SNF placement at time of discharge. Patient reports preference for a facility in [place]. CSW discussed insurance authorization process and will provide Medicare SNF ratings list. Patient has received 1 COVID vaccines. No further questions reported at this time.   Skilled Nursing Rehab Facilities-   [email or web address] Ratings out of 5 possible    Name Address  Phone # [place] Inspection Overall  [facility] [address] [place] 5 1 4 4   [facility]  [place], [place] [phone] 3 1 5 4   [facility] [place], [place] 3 1 1 1   [facility] [place], [place] 2 2 4 4   [facility] [address], [place] 3 1 2 1   [facility] [address], [place] 3 2 4 4   [facility] [address], [place] 5 1 2 2   [facility] [address] [place] 5 2 2 3   [facility] [place] 5 1 2 2   [facility] [phone] 5 1 2 2   [facility] [address], [facility] [phone] 5 1 2 2   [place] [address] [phone]. [address] [place] 3 1 1 1            Patient Goals and CMS Choice        Expected Discharge Plan and Services                                                Prior  Living Arrangements/Services                       Activities of Daily Living Home Assistive Devices/Equipment: Dentures (specify type), Hearing aid ADL Screening (condition at time of admission) Patient's cognitive ability adequate to safely complete daily activities?: Yes Is the patient deaf or have difficulty hearing?: Yes Does the patient have difficulty seeing, even when wearing glasses/contacts?: Yes Does the patient have difficulty concentrating, remembering, or making decisions?: No Patient able to express need for assistance with ADLs?: Yes Does the patient have difficulty dressing or bathing?: No Independently performs ADLs?: Yes (appropriate for developmental age) Does the patient have difficulty walking or climbing stairs?: Yes Weakness of Legs: None Weakness of Arms/Hands: None  Permission Sought/Granted                  Emotional Assessment              Admission diagnosis:  Cholecystitis [K81.9] Hyperammonemia (HCC) [E72.20] Transaminitis [R74.01] Encephalopathy [G93.40] Elevated transaminase level [R74.01] Patient Active Problem List   Diagnosis Date Noted   Decompensated liver disease ([place])    Hyponatremia    AKI (acute kidney injury) ([place])    Transaminitis  04/02/2021   Jaundice 04/02/2021   Choledocholithiasis with obstruction 04/02/2021   Leukocytosis 04/02/2021   GERD (gastroesophageal reflux disease) 04/02/2021   Portal hypertension ([place])    Choledocholithiasis    Elevated liver enzymes    Status post reverse arthroplasty of shoulder, 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   Chest pain due to CAD ([place]) 03/27/2017   Upper GI bleed    S/P coronary artery stent placement    Coronary artery disease involving native coronary artery of native heart with angina pectoris ([place])    Chest pain 03/19/2017   Liver cirrhosis secondary to NASH ([place]) 03/19/2017   Portal vein thrombosis 01/21/2016   Type 2 diabetes mellitus without complication, without long-term  current use of insulin ([place]) 01/21/2016   Coronary artery disease due to lipid rich plaque 01/21/2016   Essential hypertension    PCP:  Deberah Pelton, MD Pharmacy:   [facility] [place], [address] AT [address] [place] [address] Phone: [phone] Fax: [phone]  [place], [place] - [address] [place] [address] Phone: [phone] Fax: [phone]  [facility] #[id number] [place], [place] - [address] [place] [address] Phone: [phone] Fax: [phone]     Social Determinants of Health (SDOH) Interventions    Readmission Risk Interventions No flowsheet data found.

## 2021-04-04 NOTE — Progress Notes (Signed)
PROGRESS NOTE  Jerry [name]  GBT:[id number] DOB: November 02, 1950 DOA: 04/01/2021 PCP: Deberah Pelton, MD - Most of care at [facility].   Brief Narrative: Jerry [name] is a 70 y.o. male with a history of nonalcoholic hepatic cirrhosis, PVT, ascites, kidney cancer, CAD s/p stenting x2, and GI bleed who presented to the ED with lower abdominal pain, jaundice, and dizziness. In the ED he had stable vital signs. WBC 10.6, sodium 132, BUN 27, glucose 77, alkaline phosphatase 264->208, albumin 2.2,  lipase 90, AST 149-> 138, ALT 105-> 92, total bilirubin 10.8->10, and ammonia 72.  CT scan of the abdomen pelvis with contrast noted coronary artery calcification, advanced cirrhosis with interval thrombosis of the portal vein and evidence of portal venous hypertension with splenomegaly, mild stable ascites, cholelithiasis, and circumferential thickening of the cecum along with ascending colon reflecting portal colopathy.  Urinalysis significant for elevated specific gravity of 1.036 but no signs of infection noted.  He was admitted with GI consult who is to perform ERCP 9/28.  Assessment & Plan: Principal Problem:   Choledocholithiasis with obstruction Active Problems:   Portal vein thrombosis   Type 2 diabetes mellitus without complication, without long-term current use of insulin (HCC)   Essential hypertension   Liver cirrhosis secondary to NASH (HCC)   Transaminitis   Jaundice   Leukocytosis   GERD (gastroesophageal reflux disease)   Decompensated liver disease (HCC)   Hyponatremia   AKI (acute kidney injury) ([place])  Hyperbilirubinemia, LFT elevations: Most likely related to progressive hepatic dysfunction. With suggestion of filling defect on imaging, ERCP was pursued 9/28 with performance of sphincterotomy and sweeping with no stone passed, suspect air bubble.    - Trend  Nonalcoholic cirrhosis with portal vein thrombosis, portal HTN, ascites, esophageal varices: MELD score is 24 with 19.6% 6-month estimated mortality.  - Continue home nonselective beta blocker (coreg) - Hold lasix, spironolactone for now with rising Cr.   AKI: Concern for HRS. Prerenal azotemia possible with NPO and diuretics 9/29, cipro periprocedurally may also contribute, and pt did receive 842mIV contrast for CT abd/pelvis 9/27. Note hx renal cancer s/p partial nephrectomy. - Hold diuretics. Agree with GI with giving IVF, albumin. D/w RN to send urine studies as soon as possible.  47m9might liver lesion: Seen on CT (report below):  - Definitively needs follow-up MRI with and without contrast in 3 months  Coagulopathy: due to hepatic synthetic dysfunction, still at very high risk of clotting, has an active clot, and is at high risk of life threatening bleeding from large varices.  Thrombocytopenia and Anemia of chronic disease: Stable.   Dizziness: Suspect diminished EABV contributing, s/p IVF, though he reports reports vestibular-sounding symptoms and has consistent exam for BPPV.  - Continue vestibular rehab. This puts pt at such a high fall risk that he cannot be without supervision for mobility.    Leukocytosis: Acute.  WBC elevated 10.6.  Chest x-ray noted low lung volumes with likely atelectasis and urinalysis did not show signs of infection.  Urine culture nonclonal. Denies having any significant fever or chills. - Resolved on recheck.   Portal vein thrombosis: Chronic, Dx 2017.  Patient noted to have collaterals on CT imaging. - Heparin gtt to continue. Dosing per pharmacy. No bleeding, though therapeutic window needs to be narrow due to coagulopathy.    Acute hepatic encephalopathy: Patient reports that he has been intermittently confused.  Ammonia level noted to be 72 on admission.   - Restart lactulose  T2DM: HbA1c 8.8%.  - Hold metformin, glipizide.  - Sensitive SSI - Hypoglycemic protocol in place.   HTN: - Continue low-dose coreg as tolerated, though we have to avoid hypotension.    Hyperlipidemia:  - Previously on statin. Not giving currently.   Hypoalbuminemia: Albumin noted to be as low as 1.9 on admission. -Check prealbumin in a.m.   Severe protein calorie malnutrition: Prealbumin 5.7. Severe progressive liver disease contributing.  - Dietitian consult  GERD - Continue PPI  DVT prophylaxis: Heparin gtt Code Status: Full Family Communication: None at bedside Disposition Plan:  Status is: Inpatient  Remains inpatient appropriate because:Ongoing diagnostic testing needed not appropriate for outpatient work up and Inpatient level of care appropriate due to severity of illness  Dispo: The patient is from: Home              Anticipated d/c is to: SNF              Patient currently is not medically stable to d/c.  Consultants:  [facility]  Procedures:  ERCP 04/03/2021 Dr. Ardis Hughs:  Impression:       - Large esophageal varices (without signs of recent                            or impending bleeding) and moderate portal                            gastropathy.                           - Relatively small major papilla.                           - Nondilated biliary tree including common bile                            duct, common hepatic duct. The cystic duct                            opacified and was also normal. A small mobile                            filling defect noted on cholangiogram correlated                            with MRI suggested bile duct stone. This was                            treated with biliary sphincterotomy and balloon                            sweeping however no stones were delivered and I                            suspect that the filling defect noted today was                            probably a small air bubble. Recommendation: -  Return to hospital room. I will allow low salt                            diet.                           - Follow liver tests, I think his overall clinical                             picture is that of decompensation of known liver                            disease.                           - Consider starting non-selective B blocker for his                            large esophageal varices.                           - OK to resume his heparin at 6pm tonight however                            please note that blood thinners in the setting of                            portal gastropathy and large esophageal varices may                            be risky.  Antimicrobials: Cipro surgical ppx 9/28  Subjective: Lower abdominal pain is stable, 3/10, constant, though abdomen is a bit more distended today, hasn't had BM. No bleeding. Throat is sore. No fever or chills. Benadryl helped with diffuse itching this morning.  Objective: Vitals:   04/03/21 1502 04/03/21 1703 04/03/21 1948 04/04/21 0748  BP: (!) 146/68 (!) 147/73 (!) 143/62 131/70  Pulse: (!) 57 (!) 52 (!) 54 (!) 55  Resp: 16 18 16 15   Temp:  (!) 97.5 F (36.4 C) (!) 97.4 F (36.3 C) (!) 97.5 F (36.4 C)  TempSrc:  Oral Oral Oral  SpO2: 100% 100% 100% 99%  Weight:      Height:        Intake/Output Summary (Last 24 hours) at 04/04/2021 1342 Last data filed at 04/04/2021 0750 Gross per 24 hour  Intake 769.5 ml  Output 975 ml  Net -205.5 ml   Filed Weights   04/02/21 1200 04/02/21 1311 04/03/21 1246  Weight: 82.1 kg 82.1 kg 82.1 kg   Gen: 70 y.o. male in no distress Pulm: Nonlabored breathing room air. Clear. CV: Regular rate and rhythm. No murmur, rub, or gallop. No JVD, no dependent edema. GI: Abdomen soft, distended, mildly tender to palpation in lower quadrants without rebound or guarding, distant BS.   Ext: Warm, no deformities Skin: No new rashes, lesions or ulcers on visualized skin. Jaundice stable. Neuro: Drowsy but interactive, slowed but appropriate cognition without asterixis or focal neurological deficits. Psych: Judgement and insight appear fair. Mood euthymic & affect congruent.  Behavior is appropriate.    Data Reviewed: I have personally reviewed following labs and imaging studies  CBC: Recent Labs  Lab 04/01/21 2303 04/04/21 0541 04/04/21 0815  WBC 10.6* 6.3 9.2  NEUTROABS 7.4  --   --   HGB 13.2 11.5* 11.7*  HCT 38.4* 32.6* 33.8*  MCV 95.5 91.8 92.3  PLT 211 111* 332*   Basic Metabolic Panel: Recent Labs  Lab 04/01/21 2303 04/03/21 0325 04/04/21 0541  NA 132* 133* 127*  K 4.3 4.5 4.8  CL 107 108 101  CO2 19* 19* 16*  GLUCOSE 77 73 367*  BUN 27* 22 36*  CREATININE 1.16 1.37* 1.80*  CALCIUM 9.0 8.5* 8.6*   GFR: Estimated Creatinine Clearance: 40.1 mL/min (A) (by C-G formula based on SCr of 1.8 mg/dL (H)). Liver Function Tests: Recent Labs  Lab 04/01/21 2303 04/02/21 0546 04/03/21 0325 04/04/21 0541  AST 149* 138* 145* 135*  ALT 105* 92* 84* 84*  ALKPHOS 264* 208* 204* 193*  BILITOT 10.8* 10.0* 10.9* 10.1*  PROT 7.7 6.8 6.5 6.5  ALBUMIN 2.2* 1.9* 1.8* 1.9*   Recent Labs  Lab 04/01/21 2303  LIPASE 90*   Recent Labs  Lab 04/02/21 0103  AMMONIA 72*   Coagulation Profile: Recent Labs  Lab 04/01/21 2303 04/02/21 1224 04/03/21 0639 04/03/21 1518 04/04/21 0541  INR 1.5* 1.6* 1.7* 1.6* 2.0*   Cardiac Enzymes: No results for input(s): CKTOTAL, CKMB, CKMBINDEX, TROPONINI in the last 168 hours. BNP (last 3 results) No results for input(s): PROBNP in the last 8760 hours. HbA1C: Recent Labs    04/02/21 0546  HGBA1C 8.8*   CBG: Recent Labs  Lab 04/03/21 1705 04/03/21 2054 04/04/21 0106 04/04/21 0629 04/04/21 1145  GLUCAP 163* 430* 369* 333* 247*   Lipid Profile: No results for input(s): CHOL, HDL, LDLCALC, TRIG, CHOLHDL, LDLDIRECT in the last 72 hours. Thyroid Function Tests: No results for input(s): TSH, T4TOTAL, FREET4, T3FREE, THYROIDAB in the last 72 hours. Anemia Panel: No results for input(s): VITAMINB12, FOLATE, FERRITIN, TIBC, IRON, RETICCTPCT in the last 72 hours. Urine analysis:    Component Value  Date/Time   COLORURINE AMBER (A) 04/02/2021 0546   APPEARANCEUR CLEAR 04/02/2021 0546   LABSPEC 1.036 (H) 04/02/2021 0546   PHURINE 5.0 04/02/2021 0546   GLUCOSEU NEGATIVE 04/02/2021 0546   HGBUR NEGATIVE 04/02/2021 0546   BILIRUBINUR SMALL (A) 04/02/2021 0546   KETONESUR NEGATIVE 04/02/2021 0546   PROTEINUR NEGATIVE 04/02/2021 0546   NITRITE NEGATIVE 04/02/2021 0546   LEUKOCYTESUR NEGATIVE 04/02/2021 0546   Recent Results (from the past 240 hour(s))  Urine Culture     Status: Abnormal   Collection Time: 04/02/21  5:46 AM   Specimen: Urine, Clean Catch  Result Value Ref Range Status   Specimen Description URINE, CLEAN CATCH  Final   Special Requests   Final    NONE Performed at [facility], [place] [address] [place], [place] [address]    Culture MULTIPLE SPECIES PRESENT, SUGGEST RECOLLECTION (A)  Final   Report Status 04/02/2021 FINAL  Final  Resp Panel by RT-PCR (Flu A&B, Covid) Nasopharyngeal Swab     Status: None   Collection Time: 04/02/21  5:46 AM   Specimen: Nasopharyngeal Swab; Nasopharyngeal(NP) swabs in vial transport medium  Result Value Ref Range Status   SARS Coronavirus 2 by RT PCR NEGATIVE NEGATIVE Final    Comment: (NOTE) SARS-CoV-2 target nucleic acids are NOT DETECTED.  The SARS-CoV-2 RNA is generally detectable in upper respiratory specimens during the  acute phase of infection. The lowest concentration of SARS-CoV-2 viral copies this assay can detect is 138 copies/mL. A negative result does not preclude SARS-Cov-2 infection and should not be used as the sole basis for treatment or other patient management decisions. A negative result may occur with  improper specimen collection/handling, submission of specimen other than nasopharyngeal swab, presence of viral mutation(s) within the areas targeted by this assay, and inadequate number of viral copies(<138 copies/mL). A negative result must be combined with clinical observations, patient history, and  epidemiological information. The expected result is Negative.  Fact Sheet for Patients:  EntrepreneurPulse.com.au  Fact Sheet for Healthcare Providers:  IncredibleEmployment.be  This test is no t yet approved or cleared by the [place] FDA and  has been authorized for detection and/or diagnosis of SARS-CoV-2 by FDA under an Emergency Use Authorization (EUA). This EUA will remain  in effect (meaning this test can be used) for the duration of the COVID-19 declaration under Section 564(b)(1) of the Act, 21 U.S.C.section 360bbb-3(b)(1), unless the authorization is terminated  or revoked sooner.       Influenza A by PCR NEGATIVE NEGATIVE Final   Influenza B by PCR NEGATIVE NEGATIVE Final    Comment: (NOTE) The Xpert Xpress SARS-CoV-2/FLU/RSV plus assay is intended as an aid in the diagnosis of influenza from Nasopharyngeal swab specimens and should not be used as a sole basis for treatment. Nasal washings and aspirates are unacceptable for Xpert Xpress SARS-CoV-2/FLU/RSV testing.  Fact Sheet for Patients: EntrepreneurPulse.com.au  Fact Sheet for Healthcare Providers: IncredibleEmployment.be  This test is not yet approved or cleared by the [place] FDA and has been authorized for detection and/or diagnosis of SARS-CoV-2 by FDA under an Emergency Use Authorization (EUA). This EUA will remain in effect (meaning this test can be used) for the duration of the COVID-19 declaration under Section 564(b)(1) of the Act, 21 U.S.C. section 360bbb-3(b)(1), unless the authorization is terminated or revoked.  Performed at [facility], [place] [address] [place], [place] [address]   Surgical pcr screen     Status: None   Collection Time: 04/03/21  8:37 AM   Specimen: Nasal Mucosa; Nasal Swab  Result Value Ref Range Status   MRSA, PCR NEGATIVE NEGATIVE Final   Staphylococcus aureus NEGATIVE NEGATIVE Final     Comment: (NOTE) The Xpert SA Assay (FDA approved for NASAL specimens in patients 29 years of age and older), is one component of a comprehensive surveillance program. It is not intended to diagnose infection nor to guide or monitor treatment. Performed at [facility], [place] [address], [place], [place] [address]       Radiology Studies: DG ERCP  Result Date: 04/03/2021 CLINICAL DATA:  70 year old male with ERCP EXAM: ERCP TECHNIQUE: Multiple spot images obtained with the fluoroscopic device and submitted for interpretation post-procedure. FLUOROSCOPY TIME:  Fluoroscopy Time:  1 minute 35 seconds COMPARISON:  MR 04/02/2021 FINDINGS: Limited intraoperative fluoroscopic spot images during ERCP. Initial image demonstrates the endoscope projecting over the upper abdomen with safety wire within the extrahepatic biliary ducts. There is subsequently retrograde infusion of contrast with partial opacification. Passage of a balloon retrieval catheter. IMPRESSION: Limited images during ERCP demonstrates deployment of balloon retrieval catheter for treatment of choledocholithiasis. Please refer to the dictated operative report for full details of intraoperative findings and procedure. Electronically Signed   By: Corrie Mckusick D.O.   On: 04/03/2021 16:44    Scheduled Meds:  carvedilol  3.125 mg Oral BID  cetirizine  5 mg Oral Daily   feeding supplement (GLUCERNA SHAKE)  237 mL Oral BID BM   indomethacin  100 mg Rectal Once   insulin aspart  0-5 Units Subcutaneous QHS   insulin aspart  0-9 Units Subcutaneous TID WC   isosorbide mononitrate  90 mg Oral q morning   loratadine  10 mg Oral QHS   pantoprazole  40 mg Oral BID   sodium chloride flush  3 mL Intravenous Q12H   Continuous Infusions:  albumin human     heparin 1,500 Units/hr (04/04/21 1039)   sodium chloride       LOS: 2 days   Time spent: 35 minutes.  Patrecia Pour, MD [facility] [email or web address] 04/04/2021, 1:42 PM

## 2021-04-04 NOTE — Progress Notes (Signed)
   04/04/21 0815  Provider Notification  Provider Name/Title Shirlee Limerick, Pharmacist  Date Provider Notified 04/04/21  Time Provider Notified [phone]  Notification Type Call  Notification Reason Critical result  Test performed and critical result APTT 187  Date Critical Result Received 04/04/21  Time Critical Result Received 0815  Provider response Evaluate remotely  Date of Provider Response 04/04/21  Time of Provider Response [phone]

## 2021-04-04 NOTE — Progress Notes (Signed)
[place] for heparin Indication:  portal vein thrombosis  Allergies  Allergen Reactions   Atorvastatin Other (See Comments)    Unknown   Metoprolol Other (See Comments)    bradycardia   Penicillins Swelling    Swelling at injection site Has patient had a PCN reaction causing immediate rash, facial/tongue/throat swelling, SOB or lightheadedness with hypotension: No Has patient had a PCN reaction causing severe rash involving mucus membranes or skin necrosis: No Has patient had a PCN reaction that required hospitalization No Has patient had a PCN reaction occurring within the last 10 years: No If all of the above answers are "NO", then may proceed with Cephalosporin use.   Rosuvastatin Other (See Comments)    unknown   Simvastatin Other (See Comments)    unknown   Terazosin Other (See Comments)    Orthostatic hypotension    Patient Measurements: Height: 5' 11"  (180.3 cm) Weight: 82.1 kg (181 lb) IBW/kg (Calculated) : 75.3 Heparin Dosing Weight: 81.2 kg  Vital Signs: Temp: 97.5 F (36.4 C) (09/29 1432) Temp Source: Oral (09/29 1432) BP: 100/50 (09/29 1432) Pulse Rate: 59 (09/29 1432)  Labs: Recent Labs    04/01/21 2303 04/02/21 1224 04/02/21 2305 04/03/21 0325 04/03/21 0639 04/03/21 1518 04/04/21 0541 04/04/21 0815 04/04/21 1856  HGB 13.2  --   --   --   --   --  11.5* 11.7*  --   HCT 38.4*  --   --   --   --   --  32.6* 33.8*  --   PLT 211  --   --   --   --   --  111* 134*  --   APTT  --    < > 74*  --   --   --  160* 187*  --   LABPROT 17.9*   < >  --   --  20.4* 19.3* 22.8*  --   --   INR 1.5*   < >  --   --  1.7* 1.6* 2.0*  --   --   HEPARINUNFRC  --    < >  --   --   --   --  0.21* 0.26* 0.38  CREATININE 1.16  --   --  1.37*  --   --  1.80*  --   --    < > = values in this interval not displayed.     Estimated Creatinine Clearance: 40.1 mL/min (A) (by C-G formula based on SCr of 1.8 mg/dL (H)).   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 Confirmed with pt he was taking apixaban 2.19m twice daily and reports compliance. Apixaban held for procedure and  started on heparin. Last dose apixaban 9/26 @10 :30. MCRP shows worsening PVT compared to 2017, possible acute on chronic thrombus. Pharmacy consulted for heparin.   Heparin held 9/28 7am for ERCP. Per GI MD, ok to restart at 18:00. ERCP found large esophageal varices (no bleeding noted) and moderate portal gastropathy.  Heparin level 0.38 (on heparin 1500 units/hr)    Goal of Therapy:  Heparin level 0.3-0.5 units/mL with GI bleed risk  aPTT 66-102 secs Monitor platelets by anticoagulation protocol: Yes   Plan:  Increase heparin to 1500 units/hr  Check 8 hr heparin level  Monitor heparin level, CBC and s/s of bleeding  F/u long term anticoagulation plan - multiple difficulties with oral AC options given clinical scenario with GI bleed risk, liver dysfunction, and indication for anticoagulation. There is limited data of DOAC use in portal vein thrombosis, concern of DOAC use in liver impairment, and liver dysfunction causing elevated INR which would impair warfarin dosing. Will ask pharmacy technician to assess cost of enoxaparin and will follow along with providers on oral anticoagulation plan.   Thank you for  allowing pharmacy to be a part of this patient's care.  Donnald Garre, PharmD Clinical Pharmacist  Please check AMION for all [place] numbers After 10:00 PM, call [place] [phone]

## 2021-04-04 NOTE — Plan of Care (Signed)
  Problem: Education: Goal: Knowledge of General Education information will improve Description: Including pain rating scale, medication(s)/side effects and non-pharmacologic comfort measures Outcome: Progressing   Problem: Clinical Measurements: Goal: Ability to maintain clinical measurements within normal limits will improve Outcome: Progressing Goal: Will remain free from infection Outcome: Progressing Goal: Diagnostic test results will improve Outcome: Progressing   Problem: Nutrition: Goal: Adequate nutrition will be maintained Outcome: Progressing   Problem: Elimination: Goal: Will not experience complications related to bowel motility Outcome: Progressing Goal: Will not experience complications related to urinary retention Outcome: Progressing   Problem: Pain Managment: Goal: General experience of comfort will improve Outcome: Progressing   Problem: Safety: Goal: Ability to remain free from injury will improve Outcome: Progressing   Report received from previous shift and care assumed. Progressing towards goals as outlined above. VS obtained, shift assessments completed - see flowsheets. Patient denies pain. Turns and repositions self in bed. Heparin gtt infusing as ordered, see MAR. Patient currently resting in bed, bed in lowest position. Denies needs. Call bell within reach. Bedalarm in use at all times.

## 2021-04-04 NOTE — Progress Notes (Signed)
[place] for heparin Indication:  portal vein thrombosis  Allergies  Allergen Reactions   Atorvastatin Other (See Comments)    Unknown   Metoprolol Other (See Comments)    bradycardia   Penicillins Swelling    Swelling at injection site Has patient had a PCN reaction causing immediate rash, facial/tongue/throat swelling, SOB or lightheadedness with hypotension: No Has patient had a PCN reaction causing severe rash involving mucus membranes or skin necrosis: No Has patient had a PCN reaction that required hospitalization No Has patient had a PCN reaction occurring within the last 10 years: No If all of the above answers are "NO", then may proceed with Cephalosporin use.   Rosuvastatin Other (See Comments)    unknown   Simvastatin Other (See Comments)    unknown   Terazosin Other (See Comments)    Orthostatic hypotension    Patient Measurements: Height: 5' 11"  (180.3 cm) Weight: 82.1 kg (181 lb) IBW/kg (Calculated) : 75.3 Heparin Dosing Weight: 81.2 kg  Vital Signs: Temp: 97.5 F (36.4 C) (09/29 0748) Temp Source: Oral (09/29 0748) BP: 131/70 (09/29 0748) Pulse Rate: 55 (09/29 0748)  Labs: Recent Labs    04/01/21 2303 04/01/21 2303 04/02/21 1224 04/02/21 2305 04/03/21 0325 04/03/21 0639 04/03/21 1518 04/04/21 0541 04/04/21 0815  HGB 13.2  --   --   --   --   --   --  11.5* 11.7*  HCT 38.4*  --   --   --   --   --   --  32.6* 33.8*  PLT 211  --   --   --   --   --   --  111* 134*  APTT  --    < > 38* 74*  --   --   --  160* 187*  LABPROT 17.9*  --  18.7*  --   --  20.4* 19.3* 22.8*  --   INR 1.5*  --  1.6*  --   --  1.7* 1.6* 2.0*  --   HEPARINUNFRC  --   --  0.13*  --   --   --   --  0.21* 0.26*  CREATININE 1.16  --   --   --  1.37*  --   --  1.80*  --    < > = values in this interval not displayed.     Estimated Creatinine Clearance: 40.1 mL/min (A) (by C-G formula based on SCr of 1.8 mg/dL (H)).   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 Confirmed with pt he was taking apixaban 2.53m twice daily and reports compliance. Apixaban held for procedure and started on heparin. Last dose apixaban 9/26 @10 :30. MCRP  shows worsening PVT compared to 2017, possible acute on chronic thrombus. Pharmacy consulted for heparin.   Heparin held 9/28 7am for ERCP. Per GI MD, ok to restart at 18:00. ERCP found large esophageal varices (no bleeding noted) and moderate portal gastropathy.  Heparin level 0.26 and aPTT 187 on heparin 1450 units/hr - of note labs repeated this morning to confirm given significant aPTT increase and plt decrease and unknown how labs obtained. Confirmed with RN repeat labs drawn appropriately. Elevated aPTT likely due to liver dysfunction. Will dose based on heparin level. Plt 134 - decreased from 211 on admission. INR 2.0. Per RN no bleeding noted and no issues with IV infusion or access.    Goal of Therapy:  Heparin level 0.3-0.5 units/mL with GI bleed risk  aPTT 66-102 secs Monitor platelets by anticoagulation protocol: Yes   Plan:  Increase heparin to 1500 units/hr  Check 8 hr heparin level  Monitor heparin level, CBC and s/s of bleeding  F/u long term anticoagulation plan - multiple difficulties with oral AC options given clinical scenario with GI bleed  risk, liver dysfunction, and indication for anticoagulation. There is limited data of DOAC use in portal vein thrombosis, concern of DOAC use in liver impairment, and liver dysfunction causing elevated INR which would impair warfarin dosing. Will ask pharmacy technician to assess cost of enoxaparin and will follow along with providers on oral anticoagulation plan.   Cristela Felt, PharmD, BCPS Clinical Pharmacist 04/04/2021 9:27 AM

## 2021-04-04 NOTE — Progress Notes (Signed)
   04/04/21 1539  Clinical Encounter Type  Visited With Patient and family together  Visit Type Follow-up  Referral From Nurse  Consult/Referral To Chaplain   Chaplain responded to page for Advance Directive assistance. Pt's support person, [name], was present. Chaplain assisted Pt in filling out paperwork and answered questions. Paperwork is ready for notarization. Chaplain will follow-up with department notary about completing paperwork tomorrow.  This note was prepared by Chaplain Resident, Dante Gang, MDiv. Chaplain remains available as needed through the on-call pager: [phone].

## 2021-04-05 ENCOUNTER — Inpatient Hospital Stay (HOSPITAL_COMMUNITY): Payer: No Typology Code available for payment source

## 2021-04-05 ENCOUNTER — Encounter (HOSPITAL_COMMUNITY): Payer: Self-pay | Admitting: Gastroenterology

## 2021-04-05 DIAGNOSIS — E440 Moderate protein-calorie malnutrition: Secondary | ICD-10-CM | POA: Insufficient documentation

## 2021-04-05 DIAGNOSIS — N179 Acute kidney failure, unspecified: Secondary | ICD-10-CM | POA: Diagnosis not present

## 2021-04-05 DIAGNOSIS — K7469 Other cirrhosis of liver: Secondary | ICD-10-CM | POA: Diagnosis not present

## 2021-04-05 LAB — COMPREHENSIVE METABOLIC PANEL
ALT: 79 U/L — ABNORMAL HIGH (ref 0–44)
AST: 121 U/L — ABNORMAL HIGH (ref 15–41)
Albumin: 2.6 g/dL — ABNORMAL LOW (ref 3.5–5.0)
Alkaline Phosphatase: 174 U/L — ABNORMAL HIGH (ref 38–126)
Anion gap: 9 (ref 5–15)
BUN: 55 mg/dL — ABNORMAL HIGH (ref 8–23)
CO2: 18 mmol/L — ABNORMAL LOW (ref 22–32)
Calcium: 8.9 mg/dL (ref 8.9–10.3)
Chloride: 104 mmol/L (ref 98–111)
Creatinine, Ser: 2.37 mg/dL — ABNORMAL HIGH (ref 0.61–1.24)
GFR, Estimated: 29 mL/min — ABNORMAL LOW (ref >60)
Glucose, Bld: 267 mg/dL — ABNORMAL HIGH (ref 70–99)
Potassium: 5 mmol/L (ref 3.5–5.1)
Sodium: 131 mmol/L — ABNORMAL LOW (ref 135–145)
Total Bilirubin: 9.2 mg/dL — ABNORMAL HIGH (ref 0.3–1.2)
Total Protein: 6.6 g/dL (ref 6.5–8.1)

## 2021-04-05 LAB — CBC
HCT: 29 % — ABNORMAL LOW (ref 39.0–52.0)
Hemoglobin: 10.1 g/dL — ABNORMAL LOW (ref 13.0–17.0)
MCH: 32.5 pg (ref 26.0–34.0)
MCHC: 34.8 g/dL (ref 30.0–36.0)
MCV: 93.2 fL (ref 80.0–100.0)
Platelets: 162 10*3/uL (ref 150–400)
RBC: 3.11 MIL/uL — ABNORMAL LOW (ref 4.22–5.81)
RDW: 16.7 % — ABNORMAL HIGH (ref 11.5–15.5)
WBC: 12.8 10*3/uL — ABNORMAL HIGH (ref 4.0–10.5)
nRBC: 0 % (ref 0.0–0.2)

## 2021-04-05 LAB — PROTIME-INR
INR: 1.9 — ABNORMAL HIGH (ref 0.8–1.2)
Prothrombin Time: 21.9 seconds — ABNORMAL HIGH (ref 11.4–15.2)

## 2021-04-05 LAB — HEPARIN LEVEL (UNFRACTIONATED): Heparin Unfractionated: 0.42 IU/mL (ref 0.30–0.70)

## 2021-04-05 LAB — GLUCOSE, CAPILLARY
Glucose-Capillary: 246 mg/dL — ABNORMAL HIGH (ref 70–99)
Glucose-Capillary: 304 mg/dL — ABNORMAL HIGH (ref 70–99)
Glucose-Capillary: 199 mg/dL — ABNORMAL HIGH (ref 70–99)

## 2021-04-05 MED ORDER — INSULIN ASPART 100 UNIT/ML IJ SOLN
0.0000 [IU] | Freq: Three times a day (TID) | INTRAMUSCULAR | Status: DC
  Administered 2021-04-05: 7 [IU] via SUBCUTANEOUS
  Administered 2021-04-05: 15 [IU] via SUBCUTANEOUS
  Administered 2021-04-06 (×2): 7 [IU] via SUBCUTANEOUS
  Administered 2021-04-06: 4 [IU] via SUBCUTANEOUS
  Administered 2021-04-07: 15 [IU] via SUBCUTANEOUS
  Administered 2021-04-07: 4 [IU] via SUBCUTANEOUS
  Administered 2021-04-08: 7 [IU] via SUBCUTANEOUS
  Administered 2021-04-08: 4 [IU] via SUBCUTANEOUS
  Administered 2021-04-09: 7 [IU] via SUBCUTANEOUS
  Administered 2021-04-09: 3 [IU] via SUBCUTANEOUS
  Administered 2021-04-09 – 2021-04-10 (×2): 4 [IU] via SUBCUTANEOUS
  Administered 2021-04-10: 3 [IU] via SUBCUTANEOUS
  Administered 2021-04-10: 7 [IU] via SUBCUTANEOUS
  Administered 2021-04-11: 11 [IU] via SUBCUTANEOUS
  Administered 2021-04-11: 3 [IU] via SUBCUTANEOUS
  Administered 2021-04-11 – 2021-04-12 (×2): 7 [IU] via SUBCUTANEOUS
  Administered 2021-04-12: 3 [IU] via SUBCUTANEOUS
  Administered 2021-04-12: 7 [IU] via SUBCUTANEOUS

## 2021-04-05 NOTE — Progress Notes (Signed)
[facility] Progress Note  CC:  Elevated LFTs, cirrhosis, abdominal pain  Subjective:  No issues overnight.  No new complaints over than some left shoulder pain.  Objective:  Vital signs in last 24 hours: Temp:  [97.5 F (36.4 C)-97.8 F (36.6 C)] 97.7 F (36.5 C) (09/30 0802) Pulse Rate:  [59-62] 62 (09/30 0802) Resp:  [15-16] 15 (09/30 0802) BP: (100-130)/(50-64) 130/64 (09/30 0802) SpO2:  [99 %-100 %] 99 % (09/30 0802) Last BM Date: 04/03/21 General:  Alert, chronically ill-appearing, in NAD; jaundice noted. Heart:  Regular rate and rhythm; no murmurs Pulm:  CTAB.  No W/R/R. Abdomen:  Soft, non-distended.  BS present.  Mid-abdominal TTP. Extremities:  Without edema. Neurologic:  Alert and oriented x 4;  grossly normal neurologically.  No asterixis.  Intake/Output from previous day: 09/29 0701 - 09/30 0700 In: 2182.8 [P.O.:600; I.V.:363.1; IV Piggyback:1219.8] Out: 1300 [Urine:1300] Intake/Output this shift: Total I/O In: -  Out: 500 [Urine:500]  Lab Results: Recent Labs    04/04/21 0541 04/04/21 0815 04/05/21 0324  WBC 6.3 9.2 12.8*  HGB 11.5* 11.7* 10.1*  HCT 32.6* 33.8* 29.0*  PLT 111* 134* 162   BMET Recent Labs    04/03/21 0325 04/04/21 0541 04/05/21 0324  NA 133* 127* 131*  K 4.5 4.8 5.0  CL 108 101 104  CO2 19* 16* 18*  GLUCOSE 73 367* 267*  BUN 22 36* 55*  CREATININE 1.37* 1.80* 2.37*  CALCIUM 8.5* 8.6* 8.9   LFT Recent Labs    04/05/21 0324  PROT 6.6  ALBUMIN 2.6*  AST 121*  ALT 79*  ALKPHOS 174*  BILITOT 9.2*   PT/INR Recent Labs    04/04/21 0541 04/05/21 0324  LABPROT 22.8* 21.9*  INR 2.0* 1.9*   DG ERCP  Result Date: 04/03/2021 CLINICAL DATA:  70 year old male with ERCP EXAM: ERCP TECHNIQUE: Multiple spot images obtained with the fluoroscopic device and submitted for interpretation post-procedure. FLUOROSCOPY TIME:  Fluoroscopy Time:  1 minute 35 seconds COMPARISON:  MR 04/02/2021 FINDINGS: Limited  intraoperative fluoroscopic spot images during ERCP. Initial image demonstrates the endoscope projecting over the upper abdomen with safety wire within the extrahepatic biliary ducts. There is subsequently retrograde infusion of contrast with partial opacification. Passage of a balloon retrieval catheter. IMPRESSION: Limited images during ERCP demonstrates deployment of balloon retrieval catheter for treatment of choledocholithiasis. Please refer to the dictated operative report for full details of intraoperative findings and procedure. Electronically Signed   By: Corrie Mckusick D.O.   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  CT scan showed changes of cirrhosis and cholelithiasis.  Question if these symptoms and findings are suggestive of decompensation of his cirrhosis and some acute component of PVT versus biliary obstruction.  MRCP did in fact show choledocholithiasis, but no ductal dilatation.  Viral hepatitis panel is negative.  ERCP did not show any CBD stones, but rather an air bubble.  Suspect that his worsening numbers are representative of decompensation of his liver disease with a MELD of 33 on 9/29. *Coagulopathy: INR 1.9 *Known chronic portal vein thrombosis on chronic Eliquis, but question if there is an acute component as well so ? If this could be contributing to his pain.  He has been placed on heparin drip here. *Liver lesion:  Recommend 3 to 5-monthfollow-up imaging.  AFP normal at 1.9. *Large esophageal varices (without signs of recent or impending bleeding) and moderate portal gastropathy.  Is on carvedilol already, which can suffice. *Hyponatremia:  Na+ is improved at 131 this AM. *AKI with Cr up to 2.37 today despite liter bolus of NS and albumin, but urine sodium not <10 so not C/W HRS.  -Recommend  renal consult for increasing Cr. -Supportive care otherwise. -Trend labs.   LOS: 3 days   Laban Emperor. Jermond Burkemper  04/05/2021, 9:16 AM

## 2021-04-05 NOTE — Progress Notes (Signed)
Inpatient Diabetes Program Recommendations  AACE/ADA: New Consensus Statement on Inpatient Glycemic Control (2015)  Target Ranges:  Prepandial:   less than 140 mg/dL      Peak postprandial:   less than 180 mg/dL (1-2 hours)      Critically ill patients:  140 - 180 mg/dL   Lab Results  Component Value Date   GLUCAP 246 (H) 04/05/2021   HGBA1C 8.8 (H) 04/02/2021    Review of Glycemic Control Results for LEIB, ELAHI (MRN [id number]) as of 04/05/2021 10:48  Ref. Range 04/04/2021 11:45 04/04/2021 16:06 04/04/2021 21:48 04/05/2021 07:58  Glucose-Capillary Latest Ref Range: 70 - 99 mg/dL 247 (H) 281 (H) 272 (H) 246 (H)   Diabetes history: Type 2DM Outpatient Diabetes medications: Glipizide 10 mg BID, Metformin 500 mg BID Current orders for Inpatient glycemic control: Novolog 0-9 units TID & HS  Inpatient Diabetes Program Recommendations:    Consider adding Levemir 8 units QD.  Would also be mindful of Metformin/Glipizide on DC given GFR and rising creatinine.   Thanks, Bronson Curb, MSN, RNC-OB Diabetes Coordinator [phone] (8a-5p)

## 2021-04-05 NOTE — Progress Notes (Signed)
[place] for heparin Indication:  portal vein thrombosis  Allergies  Allergen Reactions   Atorvastatin Other (See Comments)    Unknown   Metoprolol Other (See Comments)    bradycardia   Penicillins Swelling    Swelling at injection site Has patient had a PCN reaction causing immediate rash, facial/tongue/throat swelling, SOB or lightheadedness with hypotension: No Has patient had a PCN reaction causing severe rash involving mucus membranes or skin necrosis: No Has patient had a PCN reaction that required hospitalization No Has patient had a PCN reaction occurring within the last 10 years: No If all of the above answers are "NO", then may proceed with Cephalosporin use.   Rosuvastatin Other (See Comments)    unknown   Simvastatin Other (See Comments)    unknown   Terazosin Other (See Comments)    Orthostatic hypotension    Patient Measurements: Height: 5' 11"  (180.3 cm) Weight: 82.1 kg (181 lb) IBW/kg (Calculated) : 75.3 Heparin Dosing Weight: 81.2 kg  Vital Signs: Temp: 97.8 F (36.6 C) (09/29 2043) Temp Source: Oral (09/29 2043) BP: 121/63 (09/29 2043) Pulse Rate: 62 (09/29 2043)  Labs: Recent Labs    04/02/21 1224 04/02/21 2305 04/03/21 0325 04/03/21 9470 04/03/21 1518 04/04/21 0541 04/04/21 0815 04/04/21 1856 04/05/21 0324  HGB   < >  --   --   --   --  11.5* 11.7*  --  10.1*  HCT  --   --   --   --   --  32.6* 33.8*  --  29.0*  PLT  --   --   --   --   --  111* 134*  --  162  APTT  --  74*  --   --   --  160* 187*  --   --   LABPROT  --   --   --    < > 19.3* 22.8*  --   --  21.9*  INR  --   --   --    < > 1.6* 2.0*  --   --  1.9*  HEPARINUNFRC  --   --   --   --   --  0.21* 0.26* 0.38 0.42  CREATININE  --   --  1.37*  --   --  1.80*  --   --  2.37*   < > = values in this interval not displayed.     Estimated Creatinine Clearance: 30.4 mL/min (A) (by C-G formula based on SCr of 2.37 mg/dL (H)).   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 Confirmed with pt he was taking apixaban 2.66m twice daily and reports compliance. Apixaban held for procedure  and started on heparin. Last dose apixaban 9/26 @10 :30. MCRP shows worsening PVT compared to 2017, possible acute on chronic thrombus. Pharmacy consulted for heparin.   Heparin held 9/28 7am for ERCP. Per GI MD, ok to restart at 18:00. ERCP found large esophageal varices (no bleeding noted) and moderate portal gastropathy.  Heparin level of 0.42 is therapeutic on heparin 1500 units/hr. Hgb 10.1. Plt 162. No bleeding noted.     Goal of Therapy:  Heparin level 0.3-0.5 units/mL with GI bleed risk  Monitor platelets by anticoagulation protocol: Yes   Plan:  Continue heparin to 1500 units/hr - next heparin level tomorrow morning since therapeutic x2 Monitor heparin level, CBC and s/s of bleeding  F/u long term anticoagulation plan - multiple difficulties with oral AC options given clinical scenario with GI bleed risk, liver dysfunction, and indication for anticoagulation. There is limited data of DOAC use in portal vein thrombosis, concern of DOAC use in liver impairment, and liver dysfunction causing elevated INR which would impair warfarin dosing. Pharmacy technician unable to assess cost of enoxaparin as outpatient due to  [facility]   Thank you for allowing pharmacy to be a part of this patient's care.  Cristela Felt, PharmD, BCPS Clinical Pharmacist 04/05/2021 7:03 AM

## 2021-04-05 NOTE — Progress Notes (Addendum)
PROGRESS NOTE  Jerry [name]  UOH:[id number] DOB: 15-Sep-1950 DOA: 04/01/2021 PCP: Deberah Pelton, MD - Most of care at [facility].   Brief Narrative: Jerry [name] is a 70 y.o. male with a history of nonalcoholic hepatic cirrhosis, PVT, ascites, kidney cancer, CAD s/p stenting x2, and GI bleed who presented to the ED with lower abdominal pain, jaundice, and dizziness. In the ED he had stable vital signs. WBC 10.6, sodium 132, BUN 27, glucose 77, alkaline phosphatase 264->208, albumin 2.2,  lipase 90, AST 149-> 138, ALT 105-> 92, total bilirubin 10.8->10, and ammonia 72.  CT scan of the abdomen pelvis with contrast noted coronary artery calcification, advanced cirrhosis with interval thrombosis of the portal vein and evidence of portal venous hypertension with splenomegaly, mild stable ascites, cholelithiasis, and circumferential thickening of the cecum along with ascending colon reflecting portal colopathy.  Urinalysis significant for elevated specific gravity of 1.036 but no signs of infection noted.  He was admitted with GI consult who is to perform ERCP 9/28.  Assessment & Plan: Principal Problem:   Choledocholithiasis with obstruction Active Problems:   Portal vein thrombosis   Type 2 diabetes mellitus without complication, without long-term current use of insulin (HCC)   Essential hypertension   Liver cirrhosis secondary to NASH (HCC)   Transaminitis   Jaundice   Leukocytosis   GERD (gastroesophageal reflux disease)   Decompensated liver disease (HCC)   Hyponatremia   AKI (acute kidney injury) ([place])   Malnutrition of moderate degree  Hyperbilirubinemia, LFT elevations: Most likely related to progressive hepatic dysfunction. With suggestion of filling defect on imaging, ERCP was pursued 9/28 with performance of sphincterotomy and sweeping with no stone passed, suspect air bubble.    - Trend  Nonalcoholic cirrhosis with portal vein thrombosis, portal HTN, ascites, esophageal varices:  MELD score is 24 with 19.6% 26-monthestimated mortality.  - Continue home nonselective beta blocker (coreg) - Hold lasix, spironolactone for now with rising Cr.   AKI: Concern for HRS. Prerenal azotemia possible with NPO and diuretics 9/29, cipro periprocedurally may also contribute, and pt did receive 841mIV contrast for CT abd/pelvis 9/27. Note hx renal cancer s/p partial nephrectomy. - received albumin and IV fluids on 9/20 -creatinine continues to trend up -will check renal ultrasound and bladder scan for post void residual -nephrology consult  [id number] liver lesion: Seen on CT (report below):  - Definitively needs follow-up MRI with and without contrast in 3 months  Coagulopathy: due to hepatic synthetic dysfunction, still at very high risk of clotting, has an active clot, and is at high risk of life threatening bleeding from large varices.  Thrombocytopenia and Anemia of chronic disease: Stable.   Dizziness: Suspect diminished EABV contributing, s/p IVF, though he reports reports vestibular-sounding symptoms and has consistent exam for BPPV.  - Continue vestibular rehab. This puts pt at such a high fall risk that he cannot be without supervision for mobility.  -check MRI brain to evaluate for posterior circulation infarct   Leukocytosis: Acute.  WBC elevated 10.6.  Chest x-ray noted low lung volumes with likely atelectasis and urinalysis did not show signs of infection.  Urine culture nonclonal. Denies having any significant fever or chills. - Resolved on recheck.   Portal vein thrombosis: Chronic, Dx 2017.  Patient noted to have collaterals on CT imaging. - Heparin gtt to continue. Dosing per pharmacy. No bleeding, though therapeutic window needs to be narrow due to coagulopathy.  -will discuss with GI regarding need for further  anticoagulation, since he is high risk for bleeding and he has been on anticoagulation for 5 years   Acute hepatic encephalopathy: Patient reports that  he has been intermittently confused.  Ammonia level noted to be 72 on admission.   - continue lactulose   T2DM: HbA1c 8.8%.  - Hold metformin, glipizide.  - SSI - Hypoglycemic protocol in place.   HTN: - Continue low-dose coreg as tolerated, though we have to avoid hypotension.   Hyperlipidemia:  - Previously on statin. Not giving currently in light of elevated LFTs.   Hypoalbuminemia: Albumin noted to be as low as 1.9 on admission. -related to cirrhosis -he did receive albumin infusion on 9/29   Moderate protein calorie malnutrition: Prealbumin 5.7. Severe progressive liver disease contributing.  - Dietitian consult  GERD - Continue PPI  Goals of care -discussed patient's wishes regarding end of life and his desire to undergo CPR/resuscitation of the need ever arose.  -discussed the probability of a poor outcome, considering his baseline progressive liver disease -patient agrees to DNR status at this time, but wishes to continue with all available treatments up until that point.  DVT prophylaxis: Heparin gtt, chronically on eliquis Code Status: DNR Family Communication: None at bedside Disposition Plan:  Status is: Inpatient  Remains inpatient appropriate because:Ongoing diagnostic testing needed not appropriate for outpatient work up and Inpatient level of care appropriate due to severity of illness  Dispo: The patient is from: Home              Anticipated d/c is to: SNF              Patient currently is not medically stable to d/c.  Consultants:  [facility]  Procedures:  ERCP 04/03/2021 Dr. Ardis Hughs:  Impression:       - Large esophageal varices (without signs of recent                            or impending bleeding) and moderate portal                            gastropathy.                           - Relatively small major papilla.                           - Nondilated biliary tree including common bile                            duct, common  hepatic duct. The cystic duct                            opacified and was also normal. A small mobile                            filling defect noted on cholangiogram correlated                            with MRI suggested bile duct stone. This was  treated with biliary sphincterotomy and balloon                            sweeping however no stones were delivered and I                            suspect that the filling defect noted today was                            probably a small air bubble. Recommendation: - Return to hospital room. I will allow low salt                            diet.                           - Follow liver tests, I think his overall clinical                            picture is that of decompensation of known liver                            disease.                           - Consider starting non-selective B blocker for his                            large esophageal varices.                           - OK to resume his heparin at 6pm tonight however                            please note that blood thinners in the setting of                            portal gastropathy and large esophageal varices may                            be risky.  Antimicrobials: Cipro surgical ppx 9/28  Subjective: Patient reports continued dizziness.  He feels generally weak and rundown.  Reports that his p.o. intake may be mildly better since admission.  He has not had any vomiting.  He did have some loose stools earlier today.  He has been receiving lactulose.  Objective: Vitals:   04/04/21 1432 04/04/21 2043 04/05/21 0802 04/05/21 1539  BP: (!) 100/50 121/63 130/64 126/61  Pulse: (!) 59 62 62 63  Resp: 16 16 15 16   Temp: (!) 97.5 F (36.4 C) 97.8 F (36.6 C) 97.7 F (36.5 C) 98.1 F (36.7 C)  TempSrc: Oral Oral Oral Oral  SpO2: 100% 99% 99% 100%  Weight:      Height:        Intake/Output Summary (Last 24 hours) at 04/05/2021  1542 Last data filed at 04/05/2021 1236 Gross per 24 hour  Intake 2062.84 ml  Output 1600 ml  Net 462.84 ml   Filed Weights   04/02/21 1200 04/02/21 1311 04/03/21 1246  Weight: 82.1 kg 82.1 kg 82.1 kg   General exam: Alert, awake, oriented x 3, he is jaundiced Respiratory system: Clear to auscultation. Respiratory effort normal. Cardiovascular system:RRR. No murmurs, rubs, gallops. Gastrointestinal system: Abdomen is nondistended, soft and nontender. No organomegaly or masses felt. Normal bowel sounds heard. Central nervous system: Alert and oriented. No focal neurological deficits. Mild asterixis Extremities: No C/C/E, +pedal pulses Skin: No rashes, lesions or ulcers Psychiatry: Judgement and insight appear normal. Mood & affect appropriate.    Data Reviewed: I have personally reviewed following labs and imaging studies  CBC: Recent Labs  Lab 04/01/21 2303 04/04/21 0541 04/04/21 0815 04/05/21 0324  WBC 10.6* 6.3 9.2 12.8*  NEUTROABS 7.4  --   --   --   HGB 13.2 11.5* 11.7* 10.1*  HCT 38.4* 32.6* 33.8* 29.0*  MCV 95.5 91.8 92.3 93.2  PLT 211 111* 134* 680   Basic Metabolic Panel: Recent Labs  Lab 04/01/21 2303 04/03/21 0325 04/04/21 0541 04/05/21 0324  NA 132* 133* 127* 131*  K 4.3 4.5 4.8 5.0  CL 107 108 101 104  CO2 19* 19* 16* 18*  GLUCOSE 77 73 367* 267*  BUN 27* 22 36* 55*  CREATININE 1.16 1.37* 1.80* 2.37*  CALCIUM 9.0 8.5* 8.6* 8.9   GFR: Estimated Creatinine Clearance: 30.4 mL/min (A) (by C-G formula based on SCr of 2.37 mg/dL (H)). Liver Function Tests: Recent Labs  Lab 04/01/21 2303 04/02/21 0546 04/03/21 0325 04/04/21 0541 04/05/21 0324  AST 149* 138* 145* 135* 121*  ALT 105* 92* 84* 84* 79*  ALKPHOS 264* 208* 204* 193* 174*  BILITOT 10.8* 10.0* 10.9* 10.1* 9.2*  PROT 7.7 6.8 6.5 6.5 6.6  ALBUMIN 2.2* 1.9* 1.8* 1.9* 2.6*   Recent Labs  Lab 04/01/21 2303  LIPASE 90*   Recent Labs  Lab 04/02/21 0103  AMMONIA 72*   Coagulation  Profile: Recent Labs  Lab 04/02/21 1224 04/03/21 0639 04/03/21 1518 04/04/21 0541 04/05/21 0324  INR 1.6* 1.7* 1.6* 2.0* 1.9*   Cardiac Enzymes: No results for input(s): CKTOTAL, CKMB, CKMBINDEX, TROPONINI in the last 168 hours. BNP (last 3 results) No results for input(s): PROBNP in the last 8760 hours. HbA1C: No results for input(s): HGBA1C in the last 72 hours.  CBG: Recent Labs  Lab 04/04/21 1145 04/04/21 1606 04/04/21 2148 04/05/21 0758 04/05/21 1134  GLUCAP 247* 281* 272* 246* 304*   Lipid Profile: No results for input(s): CHOL, HDL, LDLCALC, TRIG, CHOLHDL, LDLDIRECT in the last 72 hours. Thyroid Function Tests: No results for input(s): TSH, T4TOTAL, FREET4, T3FREE, THYROIDAB in the last 72 hours. Anemia Panel: No results for input(s): VITAMINB12, FOLATE, FERRITIN, TIBC, IRON, RETICCTPCT in the last 72 hours. Urine analysis:    Component Value Date/Time   COLORURINE AMBER (A) 04/02/2021 0546   APPEARANCEUR CLEAR 04/02/2021 0546   LABSPEC 1.036 (H) 04/02/2021 0546   PHURINE 5.0 04/02/2021 0546   GLUCOSEU NEGATIVE 04/02/2021 0546   HGBUR NEGATIVE 04/02/2021 0546   BILIRUBINUR SMALL (A) 04/02/2021 0546   KETONESUR NEGATIVE 04/02/2021 0546   PROTEINUR NEGATIVE 04/02/2021 0546   NITRITE NEGATIVE 04/02/2021 0546   LEUKOCYTESUR NEGATIVE 04/02/2021 0546   Recent Results (from the past 240 hour(s))  Urine Culture     Status: Abnormal   Collection Time: 04/02/21  5:46 AM   Specimen: Urine, Clean Catch  Result Value Ref Range Status   Specimen  Description URINE, CLEAN CATCH  Final   Special Requests   Final    NONE Performed at [facility], [place] [address], [place], [place] [address]    Culture MULTIPLE SPECIES PRESENT, SUGGEST RECOLLECTION (A)  Final   Report Status 04/02/2021 FINAL  Final  Resp Panel by RT-PCR (Flu A&B, Covid) Nasopharyngeal Swab     Status: None   Collection Time: 04/02/21  5:46 AM   Specimen: Nasopharyngeal Swab; Nasopharyngeal(NP)  swabs in vial transport medium  Result Value Ref Range Status   SARS Coronavirus 2 by RT PCR NEGATIVE NEGATIVE Final    Comment: (NOTE) SARS-CoV-2 target nucleic acids are NOT DETECTED.  The SARS-CoV-2 RNA is generally detectable in upper respiratory specimens during the acute phase of infection. The lowest concentration of SARS-CoV-2 viral copies this assay can detect is 138 copies/mL. A negative result does not preclude SARS-Cov-2 infection and should not be used as the sole basis for treatment or other patient management decisions. A negative result may occur with  improper specimen collection/handling, submission of specimen other than nasopharyngeal swab, presence of viral mutation(s) within the areas targeted by this assay, and inadequate number of viral copies(<138 copies/mL). A negative result must be combined with clinical observations, patient history, and epidemiological information. The expected result is Negative.  Fact Sheet for Patients:  EntrepreneurPulse.com.au  Fact Sheet for Healthcare Providers:  IncredibleEmployment.be  This test is no t yet approved or cleared by the [place] FDA and  has been authorized for detection and/or diagnosis of SARS-CoV-2 by FDA under an Emergency Use Authorization (EUA). This EUA will remain  in effect (meaning this test can be used) for the duration of the COVID-19 declaration under Section 564(b)(1) of the Act, 21 U.S.C.section 360bbb-3(b)(1), unless the authorization is terminated  or revoked sooner.       Influenza A by PCR NEGATIVE NEGATIVE Final   Influenza B by PCR NEGATIVE NEGATIVE Final    Comment: (NOTE) The Xpert Xpress SARS-CoV-2/FLU/RSV plus assay is intended as an aid in the diagnosis of influenza from Nasopharyngeal swab specimens and should not be used as a sole basis for treatment. Nasal washings and aspirates are unacceptable for Xpert Xpress  SARS-CoV-2/FLU/RSV testing.  Fact Sheet for Patients: EntrepreneurPulse.com.au  Fact Sheet for Healthcare Providers: IncredibleEmployment.be  This test is not yet approved or cleared by the [place] FDA and has been authorized for detection and/or diagnosis of SARS-CoV-2 by FDA under an Emergency Use Authorization (EUA). This EUA will remain in effect (meaning this test can be used) for the duration of the COVID-19 declaration under Section 564(b)(1) of the Act, 21 U.S.C. section 360bbb-3(b)(1), unless the authorization is terminated or revoked.  Performed at [facility], [place] [address], [place], [place] [address]   Surgical pcr screen     Status: None   Collection Time: 04/03/21  8:37 AM   Specimen: Nasal Mucosa; Nasal Swab  Result Value Ref Range Status   MRSA, PCR NEGATIVE NEGATIVE Final   Staphylococcus aureus NEGATIVE NEGATIVE Final    Comment: (NOTE) The Xpert SA Assay (FDA approved for NASAL specimens in patients 29 years of age and older), is one component of a comprehensive surveillance program. It is not intended to diagnose infection nor to guide or monitor treatment. Performed at [facility], [place] [address] [place], [place] [address]       Radiology Studies: No results found.  Scheduled Meds:  carvedilol  3.125 mg Oral BID  cetirizine  5 mg Oral Daily   feeding supplement  237 mL Oral TID BM   indomethacin  100 mg Rectal Once   insulin aspart  0-20 Units Subcutaneous TID WC   insulin aspart  0-5 Units Subcutaneous QHS   isosorbide mononitrate  90 mg Oral q morning   lactulose  20 g Oral BID   loratadine  10 mg Oral QHS   multivitamin with minerals  1 tablet Oral Daily   pantoprazole  40 mg Oral BID   sodium chloride flush  3 mL Intravenous Q12H   Continuous Infusions:  heparin 1,500 Units/hr (04/05/21 0331)     LOS: 3 days   Time spent: 35 minutes.  Kathie Dike, MD [facility] [email or web address] 04/05/2021, 3:42 PM

## 2021-04-05 NOTE — NC FL2 (Signed)
[place] LEVEL OF CARE SCREENING TOOL     IDENTIFICATION  Patient Name: Jerry [name] Birthdate: 11/23/1950 Sex: male Admission Date (Current Location): 04/01/2021  [facility] and [place] Number:  Herbalist and Address:  [facility], [place] [address], [place], [place] [address]      Provider Number: [id number]  Attending Physician Name and Address:  Kathie Dike, MD  Relative Name and Phone Number:  Salli Quarry, [phone]    Current Level of Care: Hospital Recommended Level of Care: [place] Prior Approval Number:    Date Approved/Denied:   PASRR Number: [id number]  Discharge Plan: SNF    Current Diagnoses: Patient Active Problem List   Diagnosis Date Noted   Malnutrition of moderate degree 04/05/2021   Decompensated liver disease ([place])    Hyponatremia    AKI (acute kidney injury) ([place])    Transaminitis 04/02/2021   Jaundice 04/02/2021   Choledocholithiasis with obstruction 04/02/2021   Leukocytosis 04/02/2021   GERD (gastroesophageal reflux disease) 04/02/2021   Portal hypertension ([place])    Choledocholithiasis    Elevated liver enzymes    Status post reverse arthroplasty of shoulder, 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   Chest pain due to CAD ([place]) 03/27/2017   Upper GI bleed    S/P coronary artery stent placement    Coronary artery disease involving native coronary artery of native heart with angina pectoris ([place])    Chest pain 03/19/2017   Liver cirrhosis secondary to NASH ([place]) 03/19/2017   Portal vein thrombosis 01/21/2016   Type 2 diabetes mellitus without complication, without long-term current use of insulin (HCC) 01/21/2016   Coronary artery disease due to lipid rich plaque 01/21/2016   Essential hypertension     Orientation RESPIRATION BLADDER Height & Weight     Self, Time, Situation, Place  Normal Continent Weight: 181 lb (82.1 kg) Height:  5' 11"  (180.3 cm)  BEHAVIORAL SYMPTOMS/MOOD  NEUROLOGICAL BOWEL NUTRITION STATUS      Continent Diet (See DC summary)  AMBULATORY STATUS COMMUNICATION OF NEEDS Skin   Extensive Assist Verbally Normal (Jaundice)                       Personal Care Assistance Level of Assistance  Bathing, Dressing, Feeding Bathing Assistance: Maximum assistance Feeding assistance: Independent Dressing Assistance: Maximum assistance     Functional Limitations Info  Sight, Hearing, Speech Sight Info: Adequate Hearing Info: Impaired Speech Info: Adequate    SPECIAL CARE FACTORS FREQUENCY  PT (By licensed PT), OT (By licensed OT)     PT Frequency: 5x week OT Frequency: 5x week            Contractures Contractures Info: Not present    Additional Factors Info  Code Status, Allergies, Insulin Sliding Scale Code Status Info: Full Allergies Info: Atorvastatin   Metoprolol   Penicillins   Rosuvastatin   Simvastatin   Terazosin   Insulin Sliding Scale Info: Insulin Aspart (Novolog) 0-20 U 3x daily w/ meals, 0-5U @ bedtime       Current Medications (04/05/2021):  This is the current hospital active medication list Current Facility-Administered Medications  Medication Dose Route Frequency Provider Last Rate Last Admin   acetaminophen (TYLENOL) tablet 650 mg  650 mg Oral Q6H PRN Milus Banister, MD   650 mg at 04/02/21 1542   Or   acetaminophen (TYLENOL) suppository 650 mg  650 mg Rectal Q6H PRN Milus Banister, MD  albuterol (PROVENTIL) (2.5 MG/3ML) 0.083% nebulizer solution 2.5 mg  2.5 mg Nebulization Q6H PRN Milus Banister, MD       carvedilol (COREG) tablet 3.125 mg  3.125 mg Oral BID Milus Banister, MD   3.125 mg at 04/05/21 0912   cetirizine (ZYRTEC) tablet 5 mg  5 mg Oral Daily Milus Banister, MD   5 mg at 04/05/21 0915   dextrose 50 % solution 50 mL  1 ampule Intravenous PRN Milus Banister, MD   50 mL at 04/02/21 7672   diphenhydrAMINE (BENADRYL) capsule 25 mg  25 mg Oral Q6H PRN Patrecia Pour, MD       feeding  supplement (ENSURE ENLIVE / ENSURE PLUS) liquid 237 mL  237 mL Oral TID BM Patrecia Pour, MD   237 mL at 04/05/21 0919   heparin ADULT infusion 100 units/mL (25000 units/265m)  1,500 Units/hr Intravenous Continuous BHenri Medal RPH 15 mL/hr at 04/05/21 0331 1,500 Units/hr at 04/05/21 0331   hydrocortisone-pramoxine (PROCTOFOAM-HC) rectal foam 1 applicator  1 applicator Rectal QID PRN JMilus Banister MD       indomethacin (INDOCIN) 50 MG suppository 100 mg  100 mg Rectal Once JMilus Banister MD       insulin aspart (novoLOG) injection 0-20 Units  0-20 Units Subcutaneous TID WC MKathie Dike MD   15 Units at 04/05/21 1222   insulin aspart (novoLOG) injection 0-5 Units  0-5 Units Subcutaneous QHS GPatrecia Pour MD   3 Units at 04/04/21 2201   isosorbide mononitrate (IMDUR) 24 hr tablet 90 mg  90 mg Oral q morning JMilus Banister MD   90 mg at 04/05/21 0927   lactulose (CHRONULAC) 10 GM/15ML solution 20 g  20 g Oral BID GPatrecia Pour MD   20 g at 04/04/21 2115   loratadine (CLARITIN) tablet 10 mg  10 mg Oral QHS JMilus Banister MD   10 mg at 04/04/21 2114   multivitamin with minerals tablet 1 tablet  1 tablet Oral Daily GPatrecia Pour MD   1 tablet at 04/05/21 0912   ondansetron (ZOFRAN) tablet 4 mg  4 mg Oral Q6H PRN JMilus Banister MD       Or   ondansetron ([facility] injection 4 mg  4 mg Intravenous Q6H PRN JMilus Banister MD   4 mg at 04/03/21 1745   pantoprazole (PROTONIX) EC tablet 40 mg  40 mg Oral BID JMilus Banister MD   40 mg at 04/05/21 0912   polyethylene glycol (MIRALAX / GLYCOLAX) packet 8.5 g  8.5 g Oral Daily PRN JMilus Banister MD       sodium chloride flush (NS) 0.9 % injection 3 mL  3 mL Intravenous Q12H JMilus Banister MD   3 mL at 04/04/21 2116   triamcinolone cream (KENALOG) 0.1 % cream 1 application  1 application Topical BID PRN JMilus Banister MD   1 application at [id number]    Discharge Medications: Please see discharge summary for a list of  discharge medications.  Relevant Imaging Results:  Relevant Lab Results:   Additional Information SS# [id number] covid vaccine  JCoralee Pesa LCSWA

## 2021-04-05 NOTE — Consult Note (Signed)
Nephrology Consult   Requesting provider: Kathie Dike Service requesting consult: Hospitalist Reason for consult: AKI   Assessment/Recommendations: Jerry [name] is a/an 70 y.o. male with a past medical history CAD, DM2, nonalcoholic cirrhosis c/b varices, portal vein thrombosis, RCC status post partial left nephrectomy who present w/ abdominal pain, signs of liver decompensation, complicated by AKI  Non-Oliguric AKI: Urinalysis reassuring.  Hard to say definitively but this most likely fits with contrast associated injury.  HRS is possible but I agree with GI that it seems unlikely given he does not fit the typical picture of hypotension, severely low albumin, etc. I would hold further fluids and continue with supportive care at this time. 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  -Avoid nephrotoxic medications including NSAIDs and Vanc/Zosyn combo -RUS has been ordered -Currently no indication for HD -Agree with holding diuretics  Volume Status: Appears relatively euvolemic at this time.  Hold further fluids  Hypertension: Holding home medications and diuretics  Abdominal pain: Likely multifactorial but prior thrombosis may be contributing.  Abdomen slightly distended some may have some ascites.  Would hold on LVP at this time but a diagnostic paracentesis could be considered.  Defer to GI and primary  Decompensated cirrhosis: GI following.  Defer to them  Anemia: Hemoglobin 10.  Likely multifactorial.  Continue to monitor  Uncontrolled Diabetes Mellitus Type 2 with Hyperglycemia; management per primary team  Hyponatremia: Mild with sodium  of 131 but corrects to near normal when accounting for glucose.  Continue to monitor   Recommendations conveyed to primary service.    [facility] 04/05/2021 3:44 PM   _____________________________________________________________________________________ CC: Abdominal pain  History of Present Illness: Jerry [name] is a/an 70 y.o. male with a past medical history of CAD, DM2, nonalcoholic cirrhosis c/b varices, portal vein thrombosis, RCC status post partial left nephrectomy who presents with abdominal pain.  The patient presented initially on 9/27 with pressure-like abdominal pain.  He has had chronic abdominal pain for quite some time and also relates some diffuse pain all over.  Related to portal vein thrombosis in 2017.  He continues to have some abdominal pain today.  On admission he was also noted that he had an elevated bilirubin as well as elevated AST/ALT consistent with some signs of decompensation of his liver.  He has undergone work-up with CP and ultimately underwent ERCP that did not show any stones but an air bubble.  It is felt by GI that his worsening numbers are related to decompensation of his liver.  He does have some coagulopathy and an albumin of 2.7.  MEL D is 33.  He has a history of partial left nephrectomy about 10 years ago but it appears that his kidney function has been fairly good lately and was 1.2 on admission.  No data between 08/2019 and now so may have had mild progression but hard to say.  He underwent a CT scan with contrast on admission.  Creatinine has risen precipitously to 2.4 today.  Urine output has been fairly good.  Was given fluids and albumin yesterday.  Of note, his specific gravity was fairly high shortly  after the CT scan which is consistent with hyperosmolar urine from contrast.  Urine sodium was 17 on 9/29.  His diuretics and blood pressure medications have been held.  He has not been significantly  hypotensive.   Medications:  Current Facility-Administered Medications  Medication Dose Route Frequency Provider Last Rate Last Admin   acetaminophen (TYLENOL) tablet 650 mg  650 mg Oral Q6H PRN Milus Banister, MD   650 mg at 04/02/21 1542   Or   acetaminophen (TYLENOL) suppository 650 mg  650 mg Rectal Q6H PRN Milus Banister, MD       albuterol (PROVENTIL) (2.5 MG/3ML) 0.083% nebulizer solution 2.5 mg  2.5 mg Nebulization Q6H PRN Milus Banister, MD       carvedilol (COREG) tablet 3.125 mg  3.125 mg Oral BID Milus Banister, MD   3.125 mg at 04/05/21 0912   cetirizine (ZYRTEC) tablet 5 mg  5 mg Oral Daily Milus Banister, MD   5 mg at 04/05/21 0915   dextrose 50 % solution 50 mL  1 ampule Intravenous PRN Milus Banister, MD   50 mL at 04/02/21 9390   diphenhydrAMINE (BENADRYL) capsule 25 mg  25 mg Oral Q6H PRN Patrecia Pour, MD       feeding supplement (ENSURE ENLIVE / ENSURE PLUS) liquid 237 mL  237 mL Oral TID BM Patrecia Pour, MD   237 mL at 04/05/21 0919   heparin ADULT infusion 100 units/mL (25000 units/222m)  1,500 Units/hr Intravenous Continuous BHenri Medal RPH 15 mL/hr at 04/05/21 0331 1,500 Units/hr at 04/05/21 0331   hydrocortisone-pramoxine (PROCTOFOAM-HC) rectal foam 1 applicator  1 applicator Rectal QID PRN JMilus Banister MD       indomethacin (INDOCIN) 50 MG suppository 100 mg  100 mg Rectal Once JMilus Banister MD       insulin aspart (novoLOG) injection 0-20 Units  0-20 Units Subcutaneous TID WC MKathie Dike MD   15 Units at 04/05/21 1222   insulin aspart (novoLOG) injection 0-5 Units  0-5 Units Subcutaneous QHS GPatrecia Pour MD   3 Units at 04/04/21 2201   isosorbide mononitrate (IMDUR) 24 hr tablet 90 mg  90 mg Oral q morning JMilus Banister MD   90 mg at 04/05/21 0927   lactulose (CHRONULAC) 10 GM/15ML solution 20 g  20 g Oral BID GPatrecia Pour MD   20 g at 04/04/21 2115   loratadine (CLARITIN) tablet 10 mg  10 mg Oral QHS JMilus Banister MD   10  mg at 04/04/21 2114   multivitamin with minerals tablet 1 tablet  1 tablet Oral Daily GPatrecia Pour MD   1 tablet at 04/05/21 0912   ondansetron (ZOFRAN) tablet 4 mg  4 mg Oral Q6H PRN JMilus Banister MD       Or   ondansetron ([facility] injection 4 mg  4 mg Intravenous Q6H PRN JMilus Banister MD   4 mg at 04/03/21 1745   pantoprazole (PROTONIX) EC tablet 40 mg  40 mg Oral BID JMilus Banister MD   40 mg at 04/05/21 0912   polyethylene glycol (MIRALAX / GLYCOLAX) packet 8.5 g  8.5 g Oral Daily PRN JMilus Banister MD       sodium chloride flush (NS) 0.9 % injection 3 mL  3 mL Intravenous Q12H JMilus Banister MD   3 mL at 04/04/21 2116   triamcinolone cream (KENALOG) 0.1 % cream 1 application  1 application Topical BID PRN Milus Banister, MD   1 application at 35/00/93 [phone]     ALLERGIES Atorvastatin, Metoprolol, Penicillins, Rosuvastatin, Simvastatin, and Terazosin  MEDICAL HISTORY Past Medical History:  Diagnosis Date   Arthritis    Coronary artery disease    Diabetes mellitus without complication (HCC)    GERD (gastroesophageal reflux disease)    food related only with spicy only   Headache    h/o as a child-migraines   History of appendectomy    History of hiatal hernia    Hx of heart artery stent    Hypercholesteremia    Hypertension    Liver lesion    Primary cancer of kidney or ureter    Kidney cancer with partial removal of left kidney   PTSD (post-traumatic stress disorder)    Thrombosis    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     SOCIAL HISTORY Social History   Socioeconomic History   Marital status: Single    Spouse name: Not on file   Number of children: Not on file   Years of education: Not on file   Highest education level: Not on file  Occupational History   Occupation: retired  Tobacco Use   Smoking status: Never   Smokeless tobacco: Never  Vaping Use   Vaping Use: Never used  Substance and Sexual Activity   Alcohol use:  No   Drug use: No   Sexual activity: Yes  Other Topics Concern   Not on file  Social History Narrative   Not on file   Social Determinants of Health   Financial Resource Strain: Not on file  Food Insecurity: Not on file  Transportation Needs: Not on file  Physical Activity: Not on file  Stress: Not on file  Social Connections: Not on file  Intimate Partner Violence: Not on file     FAMILY HISTORY Family History  Problem Relation Age of Onset   Stroke Mother    Heart attack Mother    Stroke Father    Heart attack Father    Lung cancer Brother       Review of Systems: 12 systems reviewed Otherwise as per HPI, all other systems reviewed and negative  Physical Exam: Vitals:   04/05/21 0802 04/05/21 1539  BP: 130/64 126/61  Pulse: 62 63  Resp: 15 16  Temp: 97.7 F (36.5 C) 98.1 F (36.7 C)  SpO2: 99% 100%   Total I/O In: 240 [P.O.:240] Out: 1100 [Urine:1100]  Intake/Output Summary (Last 24 hours) at 04/05/2021 1544 Last data filed at 04/05/2021 1236 Gross per 24 hour  Intake 2062.84 ml  Output 1600 ml  Net 462.84 ml   General: well-appearing, no acute distress HEENT: Scleral icterus present, oropharynx clear without lesions CV: regular rate, normal rhythm, no murmurs, no gallops, no rubs, no peripheral edema Lungs: clear to auscultation bilaterally, normal work of breathing Abd: soft, non-tender, mild distention Skin: Jaundice, but otherwise no visible lesions or rashes Psych: alert, engaged, appropriate mood and affect Musculoskeletal: no obvious deformities Neuro: normal speech, no gross focal deficits   Test Results Reviewed Lab Results  Component Value Date   NA 131 (L) 04/05/2021   K 5.0 04/05/2021   CL 104 04/05/2021   CO2 18 (L) 04/05/2021   BUN 55 (H) 04/05/2021   CREATININE 2.37 (H) 04/05/2021   CALCIUM 8.9 04/05/2021   ALBUMIN 2.6 (L) 04/05/2021   PHOS 2.9 01/28/2016   Recent Results (from the past  2160 hour(s))  CBC with  Differential     Status: Abnormal   Collection Time: 04/01/21 11:03 PM  Result Value Ref Range   WBC 10.6 (H) 4.0 - 10.5 K/uL   RBC 4.02 (L) 4.22 - 5.81 MIL/uL   Hemoglobin 13.2 13.0 - 17.0 g/dL   HCT 38.4 (L) 39.0 - 52.0 %   MCV 95.5 80.0 - 100.0 fL   MCH 32.8 26.0 - 34.0 pg   MCHC 34.4 30.0 - 36.0 g/dL   RDW 16.7 (H) 11.5 - 15.5 %   Platelets 211 150 - 400 K/uL   nRBC 0.0 0.0 - 0.2 %   Neutrophils Relative % 70 %   Neutro Abs 7.4 1.7 - 7.7 K/uL   Lymphocytes Relative 15 %   Lymphs Abs 1.6 0.7 - 4.0 K/uL   Monocytes Relative 9 %   Monocytes Absolute 0.9 0.1 - 1.0 K/uL   Eosinophils Relative 3 %   Eosinophils Absolute 0.3 0.0 - 0.5 K/uL   Basophils Relative 1 %   Basophils Absolute 0.1 0.0 - 0.1 K/uL   Immature Granulocytes 2 %   Abs Immature Granulocytes 0.18 (H) 0.00 - 0.07 K/uL    Comment: Performed at [facility] [address], [place], [place] [address]  Comprehensive metabolic panel     Status: Abnormal   Collection Time: 04/01/21 11:03 PM  Result Value Ref Range   Sodium 132 (L) 135 - 145 mmol/L   Potassium 4.3 3.5 - 5.1 mmol/L   Chloride 107 98 - 111 mmol/L   CO2 19 (L) 22 - 32 mmol/L   Glucose, Bld 77 70 - 99 mg/dL    Comment: Glucose reference range applies only to samples taken after fasting for at least 8 hours.   BUN 27 (H) 8 - 23 mg/dL   Creatinine, Ser 1.16 0.61 - 1.24 mg/dL   Calcium 9.0 8.9 - 10.3 mg/dL   Total Protein 7.7 6.5 - 8.1 g/dL   Albumin 2.2 (L) 3.5 - 5.0 g/dL   AST 149 (H) 15 - 41 U/L   ALT 105 (H) 0 - 44 U/L   Alkaline Phosphatase 264 (H) 38 - 126 U/L   Total Bilirubin 10.8 (H) 0.3 - 1.2 mg/dL   GFR, Estimated >60 >60 mL/min    Comment: (NOTE) Calculated using the CKD-EPI Creatinine Equation (2021)    Anion gap 6 5 - 15    Comment: Performed at [facility], [place] [address], [place], [place] [address]  Lipase, blood     Status: Abnormal   Collection Time: 04/01/21 11:03 PM  Result Value Ref Range   Lipase 90 (H) 11 - 51  U/L    Comment: Performed at [facility] [address] [place], [place] [address]  Protime-INR     Status: Abnormal   Collection Time: 04/01/21 11:03 PM  Result Value Ref Range   Prothrombin Time 17.9 (H) 11.4 - 15.2 seconds   INR 1.5 (H) 0.8 - 1.2    Comment: (NOTE) INR goal varies based on device and disease states. Performed at [facility], [place] [address] [place], [place] [address]   ABO/Rh     Status: None   Collection Time: 04/01/21 11:03 PM  Result Value Ref Range   ABO/RH(D)      B POS Performed at [facility] [address], [place], [place] [address]   Ammonia     Status: Abnormal   Collection Time:  04/02/21  1:03 AM  Result Value Ref Range   Ammonia 72 (H) 9 - 35 umol/L    Comment: Performed at [facility], [place] [address], [place], [place] [address]  Urinalysis, Routine w reflex microscopic Urine, Clean Catch     Status: Abnormal   Collection Time: 04/02/21  5:46 AM  Result Value Ref Range   Color, Urine AMBER (A) YELLOW    Comment: BIOCHEMICALS MAY BE AFFECTED BY COLOR   APPearance CLEAR CLEAR   Specific Gravity, Urine 1.036 (H) 1.005 - 1.030   pH 5.0 5.0 - 8.0   Glucose, UA NEGATIVE NEGATIVE mg/dL   Hgb urine dipstick NEGATIVE NEGATIVE   Bilirubin Urine SMALL (A) NEGATIVE   Ketones, ur NEGATIVE NEGATIVE mg/dL   Protein, ur NEGATIVE NEGATIVE mg/dL   Nitrite NEGATIVE NEGATIVE   Leukocytes,Ua NEGATIVE NEGATIVE   RBC / HPF 0-5 0 - 5 RBC/hpf   WBC, UA 0-5 0 - 5 WBC/hpf   Bacteria, UA NONE SEEN NONE SEEN   Squamous Epithelial / LPF 0-5 0 - 5   Mucus PRESENT     Comment: Performed at [facility], [place] [address], [place], [place] [address]  Urine Culture     Status: Abnormal   Collection Time: 04/02/21  5:46 AM   Specimen: Urine, Clean Catch  Result Value Ref Range   Specimen Description URINE, CLEAN CATCH    Special Requests      NONE Performed at [facility], [place] [address] [place], [place] [address]     Culture MULTIPLE SPECIES PRESENT, SUGGEST RECOLLECTION (A)    Report Status 04/02/2021 FINAL   Hepatitis panel, acute     Status: None   Collection Time: 04/02/21  5:46 AM  Result Value Ref Range   Hepatitis B Surface Ag NON REACTIVE NON REACTIVE   HCV Ab NON REACTIVE NON REACTIVE    Comment: (NOTE) Nonreactive HCV antibody screen is consistent with no HCV infections,  unless recent infection is suspected or other evidence exists to indicate HCV infection.     Hep A IgM NON REACTIVE NON REACTIVE   Hep B C IgM NON REACTIVE NON REACTIVE    Comment: Performed at [facility], [place] [address], [place], [place] [address]  Hepatic function panel     Status: Abnormal   Collection Time: 04/02/21  5:46 AM  Result Value Ref Range   Total Protein 6.8 6.5 - 8.1 g/dL   Albumin 1.9 (L) 3.5 - 5.0 g/dL   AST 138 (H) 15 - 41 U/L   ALT 92 (H) 0 - 44 U/L   Alkaline Phosphatase 208 (H) 38 - 126 U/L   Total Bilirubin 10.0 (H) 0.3 - 1.2 mg/dL   Bilirubin, Direct 5.9 (H) 0.0 - 0.2 mg/dL   Indirect Bilirubin 4.1 (H) 0.3 - 0.9 mg/dL    Comment: Performed at [facility] [address], [place], [place] [address]  Resp Panel by RT-PCR (Flu A&B, Covid) Nasopharyngeal Swab     Status: None   Collection Time: 04/02/21  5:46 AM   Specimen: Nasopharyngeal Swab; Nasopharyngeal(NP) swabs in vial transport medium  Result Value Ref Range   SARS Coronavirus 2 by RT PCR NEGATIVE NEGATIVE    Comment: (NOTE) SARS-CoV-2 target nucleic acids are NOT DETECTED.  The SARS-CoV-2 RNA is generally detectable in upper respiratory specimens during the acute phase of infection. The lowest concentration of SARS-CoV-2 viral copies this assay can detect is 138  copies/mL. A negative result does not preclude SARS-Cov-2 infection and should not be used as the sole basis for treatment or other patient management decisions. A negative result may occur with  improper specimen collection/handling, submission of specimen  other than nasopharyngeal swab, presence of viral mutation(s) within the areas targeted by this assay, and inadequate number of viral copies(<138 copies/mL). A negative result must be combined with clinical observations, patient history, and epidemiological information. The expected result is Negative.  Fact Sheet for Patients:  EntrepreneurPulse.com.au  Fact Sheet for Healthcare Providers:  IncredibleEmployment.be  This test is no t yet approved or cleared by the [place] FDA and  has been authorized for detection and/or diagnosis of SARS-CoV-2 by FDA under an Emergency Use Authorization (EUA). This EUA will remain  in effect (meaning this test can be used) for the duration of the COVID-19 declaration under Section 564(b)(1) of the Act, 21 U.S.C.section 360bbb-3(b)(1), unless the authorization is terminated  or revoked sooner.       Influenza A by PCR NEGATIVE NEGATIVE   Influenza B by PCR NEGATIVE NEGATIVE    Comment: (NOTE) The Xpert Xpress SARS-CoV-2/FLU/RSV plus assay is intended as an aid in the diagnosis of influenza from Nasopharyngeal swab specimens and should not be used as a sole basis for treatment. Nasal washings and aspirates are unacceptable for Xpert Xpress SARS-CoV-2/FLU/RSV testing.  Fact Sheet for Patients: EntrepreneurPulse.com.au  Fact Sheet for Healthcare Providers: IncredibleEmployment.be  This test is not yet approved or cleared by the [place] FDA and has been authorized for detection and/or diagnosis of SARS-CoV-2 by FDA under an Emergency Use Authorization (EUA). This EUA will remain in effect (meaning this test can be used) for the duration of the COVID-19 declaration under Section 564(b)(1) of the Act, 21 U.S.C. section 360bbb-3(b)(1), unless the authorization is terminated or revoked.  Performed at [facility], [place] [address], [place], [place] [address]    Hemoglobin A1c     Status: Abnormal   Collection Time: 04/02/21  5:46 AM  Result Value Ref Range   Hgb A1c MFr Bld 8.8 (H) 4.8 - 5.6 %    Comment: (NOTE)         Prediabetes: 5.7 - 6.4         Diabetes: >6.4         Glycemic control for adults with diabetes: <7.0    Mean Plasma Glucose 206 mg/dL    Comment: (NOTE) Performed At: [facility] [place], [place] [id number] Rush Farmer MD [id number]   CBG monitoring, ED     Status: Abnormal   Collection Time: 04/02/21  9:05 AM  Result Value Ref Range   Glucose-Capillary 60 (L) 70 - 99 mg/dL    Comment: Glucose reference range applies only to samples taken after fasting for at least 8 hours.  CBG monitoring, ED     Status: None   Collection Time: 04/02/21 10:58 AM  Result Value Ref Range   Glucose-Capillary 83 70 - 99 mg/dL    Comment: Glucose reference range applies only to samples taken after fasting for at least 8 hours.  APTT     Status: Abnormal   Collection Time: 04/02/21 12:24 PM  Result Value Ref Range   aPTT 38 (H) 24 - 36 seconds    Comment:        IF BASELINE aPTT IS ELEVATED, SUGGEST PATIENT RISK ASSESSMENT BE USED TO DETERMINE APPROPRIATE ANTICOAGULANT THERAPY. Performed at [facility], [place]  [address], [place], [place] [address]   Heparin level (unfractionated)     Status: Abnormal   Collection Time: 04/02/21 12:24 PM  Result Value Ref Range   Heparin Unfractionated 0.13 (L) 0.30 - 0.70 IU/mL    Comment: (NOTE) The clinical reportable range upper limit is being lowered to >1.10 to align with the FDA approved guidance for the current laboratory assay.  If heparin results are below expected values, and patient dosage has  been confirmed, suggest follow up testing of antithrombin III levels. Performed at [facility], [place] [address] [place], [place] [address]   Protime-INR     Status: Abnormal   Collection Time: 04/02/21 12:24 PM  Result Value Ref Range   Prothrombin  Time 18.7 (H) 11.4 - 15.2 seconds   INR 1.6 (H) 0.8 - 1.2    Comment: (NOTE) INR goal varies based on device and disease states. Performed at [facility], [place] [address], [place], [place] [address]   CBG monitoring, ED     Status: Abnormal   Collection Time: 04/02/21  3:30 PM  Result Value Ref Range   Glucose-Capillary 52 (L) 70 - 99 mg/dL    Comment: Glucose reference range applies only to samples taken after fasting for at least 8 hours.  CBG monitoring, ED     Status: Abnormal   Collection Time: 04/02/21  4:36 PM  Result Value Ref Range   Glucose-Capillary 151 (H) 70 - 99 mg/dL    Comment: Glucose reference range applies only to samples taken after fasting for at least 8 hours.  CBG monitoring, ED     Status: Abnormal   Collection Time: 04/02/21  7:43 PM  Result Value Ref Range   Glucose-Capillary 188 (H) 70 - 99 mg/dL    Comment: Glucose reference range applies only to samples taken after fasting for at least 8 hours.  APTT     Status: Abnormal   Collection Time: 04/02/21 11:05 PM  Result Value Ref Range   aPTT 74 (H) 24 - 36 seconds    Comment:        IF BASELINE aPTT IS ELEVATED, SUGGEST PATIENT RISK ASSESSMENT BE USED TO DETERMINE APPROPRIATE ANTICOAGULANT THERAPY. Performed at [facility], [place] [address] [place], [place] [address]   AFP tumor marker     Status: None   Collection Time: 04/03/21  3:25 AM  Result Value Ref Range   AFP, Serum, Tumor Marker 1.9 0.0 - 8.4 ng/mL    Comment: (NOTE) Roche [employer] Electrochemiluminescence Immunoassay (ECLIA) Values obtained with different assay methods or kits cannot be used interchangeably.  Results cannot be interpreted as absolute evidence of the presence or absence of malignant disease. This test is not interpretable in pregnant females. Performed At: [facility] [place], [place] [id number] Rush Farmer MD [id number]   Comprehensive metabolic panel     Status: Abnormal    Collection Time: 04/03/21  3:25 AM  Result Value Ref Range   Sodium 133 (L) 135 - 145 mmol/L   Potassium 4.5 3.5 - 5.1 mmol/L   Chloride 108 98 - 111 mmol/L   CO2 19 (L) 22 - 32 mmol/L   Glucose, Bld 73 70 - 99 mg/dL    Comment: Glucose reference range applies only to samples taken after fasting for at least 8 hours.   BUN 22 8 - 23 mg/dL   Creatinine, Ser 1.37 (H) 0.61 - 1.24 mg/dL   Calcium 8.5 (L) 8.9 -  10.3 mg/dL   Total Protein 6.5 6.5 - 8.1 g/dL   Albumin 1.8 (L) 3.5 - 5.0 g/dL   AST 145 (H) 15 - 41 U/L   ALT 84 (H) 0 - 44 U/L   Alkaline Phosphatase 204 (H) 38 - 126 U/L   Total Bilirubin 10.9 (H) 0.3 - 1.2 mg/dL   GFR, Estimated 55 (L) >60 mL/min    Comment: (NOTE) Calculated using the CKD-EPI Creatinine Equation (2021)    Anion gap 6 5 - 15    Comment: Performed at [facility] [address], [place], [place] [address]  Prealbumin     Status: Abnormal   Collection Time: 04/03/21  3:25 AM  Result Value Ref Range   Prealbumin 5.7 (L) 18 - 38 mg/dL    Comment: Performed at [facility] [address] [place], [place] [address]  Glucose, capillary     Status: None   Collection Time: 04/03/21  6:28 AM  Result Value Ref Range   Glucose-Capillary 70 70 - 99 mg/dL    Comment: Glucose reference range applies only to samples taken after fasting for at least 8 hours.  Protime-INR     Status: Abnormal   Collection Time: 04/03/21  6:39 AM  Result Value Ref Range   Prothrombin Time 20.4 (H) 11.4 - 15.2 seconds   INR 1.7 (H) 0.8 - 1.2    Comment: (NOTE) INR goal varies based on device and disease states. Performed at [facility], [place] [address] [place], [place] [address]   Prepare fresh frozen plasma     Status: None   Collection Time: 04/03/21  7:29 AM  Result Value Ref Range   Unit Number [id number]    Blood Component Type THW PLS APHR    Unit division 00    Status of Unit ISSUED,FINAL    Transfusion Status      OK TO TRANSFUSE Performed at  [facility], [place] [address] [place], [place] [address]    Unit Number [id number]    Blood Component Type THAWED PLASMA    Unit division 00    Status of Unit ISSUED,FINAL    Transfusion Status OK TO TRANSFUSE   BPAM FFP     Status: None   Collection Time: 04/03/21  7:29 AM  Result Value Ref Range   ISSUE DATE / TIME [id number]    Blood Product Unit Number [id number]    PRODUCT CODE E2121V00    Unit Type and Rh 8400    Blood Product Expiration Date 202210012359    ISSUE DATE / TIME [id number]    Blood Product Unit Number [id number]    PRODUCT CODE [id number]    Unit Type and Rh 7300    Blood Product Expiration Date [id number]   Glucose, capillary     Status: Abnormal   Collection Time: 04/03/21  8:21 AM  Result Value Ref Range   Glucose-Capillary 66 (L) 70 - 99 mg/dL    Comment: Glucose reference range applies only to samples taken after fasting for at least 8 hours.   Comment 1 Notify RN   Surgical pcr screen     Status: None   Collection Time: 04/03/21  8:37 AM   Specimen: Nasal Mucosa; Nasal Swab  Result Value Ref Range   MRSA, PCR NEGATIVE NEGATIVE   Staphylococcus aureus NEGATIVE NEGATIVE    Comment: (NOTE) The Xpert SA Assay (FDA approved for NASAL specimens in patients 80 years of age and older),  is one component of a comprehensive surveillance program. It is not intended to diagnose infection nor to guide or monitor treatment. Performed at [facility], [place] [address], [place], [place] [address]   Glucose, capillary     Status: Abnormal   Collection Time: 04/03/21  9:28 AM  Result Value Ref Range   Glucose-Capillary 101 (H) 70 - 99 mg/dL    Comment: Glucose reference range applies only to samples taken after fasting for at least 8 hours.  Glucose, capillary     Status: None   Collection Time: 04/03/21 11:40 AM  Result Value Ref Range   Glucose-Capillary 82 70 - 99 mg/dL    Comment: Glucose reference range applies only to samples taken  after fasting for at least 8 hours.  Protime-INR     Status: Abnormal   Collection Time: 04/03/21  3:18 PM  Result Value Ref Range   Prothrombin Time 19.3 (H) 11.4 - 15.2 seconds   INR 1.6 (H) 0.8 - 1.2    Comment: (NOTE) INR goal varies based on device and disease states. Performed at [facility], [place] [address] [place], [place] [address]   Glucose, capillary     Status: Abnormal   Collection Time: 04/03/21  5:05 PM  Result Value Ref Range   Glucose-Capillary 163 (H) 70 - 99 mg/dL    Comment: Glucose reference range applies only to samples taken after fasting for at least 8 hours.  Glucose, capillary     Status: Abnormal   Collection Time: 04/03/21  8:54 PM  Result Value Ref Range   Glucose-Capillary 430 (H) 70 - 99 mg/dL    Comment: Glucose reference range applies only to samples taken after fasting for at least 8 hours.   Comment 1 Notify RN   Glucose, capillary     Status: Abnormal   Collection Time: 04/04/21  1:06 AM  Result Value Ref Range   Glucose-Capillary 369 (H) 70 - 99 mg/dL    Comment: Glucose reference range applies only to samples taken after fasting for at least 8 hours.  APTT     Status: Abnormal   Collection Time: 04/04/21  5:41 AM  Result Value Ref Range   aPTT 160 (H) 24 - 36 seconds    Comment:        IF BASELINE aPTT IS ELEVATED, SUGGEST PATIENT RISK ASSESSMENT BE USED TO DETERMINE APPROPRIATE ANTICOAGULANT THERAPY. Performed at [facility], [place] [address] [place], [place] [address]   Heparin level (unfractionated)     Status: Abnormal   Collection Time: 04/04/21  5:41 AM  Result Value Ref Range   Heparin Unfractionated 0.21 (L) 0.30 - 0.70 IU/mL    Comment: (NOTE) The clinical reportable range upper limit is being lowered to >1.10 to align with the FDA approved guidance for the current laboratory assay.  If heparin results are below expected values, and patient dosage has  been confirmed, suggest follow up testing of antithrombin  III levels. Performed at [facility], [place] [address] [place], [place] [address]   Comprehensive metabolic panel     Status: Abnormal   Collection Time: 04/04/21  5:41 AM  Result Value Ref Range   Sodium 127 (L) 135 - 145 mmol/L   Potassium 4.8 3.5 - 5.1 mmol/L   Chloride 101 98 - 111 mmol/L   CO2 16 (L) 22 - 32 mmol/L   Glucose, Bld 367 (H) 70 - 99 mg/dL    Comment: Glucose  reference range applies only to samples taken after fasting for at least 8 hours.   BUN 36 (H) 8 - 23 mg/dL   Creatinine, Ser 1.80 (H) 0.61 - 1.24 mg/dL   Calcium 8.6 (L) 8.9 - 10.3 mg/dL   Total Protein 6.5 6.5 - 8.1 g/dL   Albumin 1.9 (L) 3.5 - 5.0 g/dL   AST 135 (H) 15 - 41 U/L   ALT 84 (H) 0 - 44 U/L   Alkaline Phosphatase 193 (H) 38 - 126 U/L   Total Bilirubin 10.1 (H) 0.3 - 1.2 mg/dL   GFR, Estimated 40 (L) >60 mL/min    Comment: (NOTE) Calculated using the CKD-EPI Creatinine Equation (2021)    Anion gap 10 5 - 15    Comment: Performed at [facility], [place] [address], [place], [place] [address]  CBC     Status: Abnormal   Collection Time: 04/04/21  5:41 AM  Result Value Ref Range   WBC 6.3 4.0 - 10.5 K/uL   RBC 3.55 (L) 4.22 - 5.81 MIL/uL   Hemoglobin 11.5 (L) 13.0 - 17.0 g/dL   HCT 32.6 (L) 39.0 - 52.0 %   MCV 91.8 80.0 - 100.0 fL   MCH 32.4 26.0 - 34.0 pg   MCHC 35.3 30.0 - 36.0 g/dL   RDW 15.9 (H) 11.5 - 15.5 %   Platelets 111 (L) 150 - 400 K/uL    Comment: PLATELET CLUMPS NOTED ON SMEAR, UNABLE TO ESTIMATE   nRBC 0.0 0.0 - 0.2 %    Comment: Performed at [facility] [address], [place],  [address]  Protime-INR     Status: Abnormal   Collection Time: 04/04/21  5:41 AM  Result Value Ref Range   Prothrombin Time 22.8 (H) 11.4 - 15.2 seconds   INR 2.0 (H) 0.8 - 1.2    Comment: (NOTE) INR goal varies based on device and disease states. Performed at [facility], [place] [address] [place], [place] [address]   Glucose, capillary     Status: Abnormal    Collection Time: 04/04/21  6:29 AM  Result Value Ref Range   Glucose-Capillary 333 (H) 70 - 99 mg/dL    Comment: Glucose reference range applies only to samples taken after fasting for at least 8 hours.  CBC     Status: Abnormal   Collection Time: 04/04/21  8:15 AM  Result Value Ref Range   WBC 9.2 4.0 - 10.5 K/uL   RBC 3.66 (L) 4.22 - 5.81 MIL/uL   Hemoglobin 11.7 (L) 13.0 - 17.0 g/dL   HCT 33.8 (L) 39.0 - 52.0 %   MCV 92.3 80.0 - 100.0 fL   MCH 32.0 26.0 - 34.0 pg   MCHC 34.6 30.0 - 36.0 g/dL   RDW 15.9 (H) 11.5 - 15.5 %   Platelets 134 (L) 150 - 400 K/uL    Comment: REPEATED TO VERIFY   nRBC 0.0 0.0 - 0.2 %    Comment: Performed at [facility], [place] [address] [place], [place] [address]  Heparin level (unfractionated)     Status: Abnormal   Collection Time: 04/04/21  8:15 AM  Result Value Ref Range   Heparin Unfractionated 0.26 (L) 0.30 - 0.70 IU/mL    Comment: (NOTE) The clinical reportable range upper limit is being lowered to >1.10 to align with the FDA approved guidance for the current laboratory assay.  If heparin results are below expected values, and patient dosage has  been confirmed, suggest follow up testing of antithrombin III levels. Performed at [facility], [place] [address] [place], [place] [address]   APTT     Status: Abnormal   Collection Time: 04/04/21  8:15 AM  Result Value Ref Range   aPTT 187 (HH) 24 - 36 seconds    Comment:        IF BASELINE aPTT IS ELEVATED, SUGGEST PATIENT RISK ASSESSMENT BE USED TO DETERMINE APPROPRIATE ANTICOAGULANT THERAPY. REPEATED TO VERIFY CRITICAL RESULT CALLED TO, READ BACK BY AND VERIFIED WITH: L. [place], [place] 04/04/2021 L. KLAR Performed at [facility], [place] [address], [place], [place] [address]   Glucose, capillary     Status: Abnormal   Collection Time: 04/04/21 11:45 AM  Result Value Ref Range   Glucose-Capillary 247 (H) 70 - 99 mg/dL    Comment: Glucose reference range applies only to  samples taken after fasting for at least 8 hours.  Glucose, capillary     Status: Abnormal   Collection Time: 04/04/21  4:06 PM  Result Value Ref Range   Glucose-Capillary 281 (H) 70 - 99 mg/dL    Comment: Glucose reference range applies only to samples taken after fasting for at least 8 hours.  Heparin level (unfractionated)     Status: None   Collection Time: 04/04/21  6:56 PM  Result Value Ref Range   Heparin Unfractionated 0.38 0.30 - 0.70 IU/mL    Comment: (NOTE) The clinical reportable range upper limit is being lowered to >1.10 to align with the FDA approved guidance for the current laboratory assay.  If heparin results are below expected values, and patient dosage has  been confirmed, suggest follow up testing of antithrombin III levels. Performed at [facility], [place] [address] [place], [place] [address]   Glucose, capillary     Status: Abnormal   Collection Time: 04/04/21  9:48 PM  Result Value Ref Range   Glucose-Capillary 272 (H) 70 - 99 mg/dL    Comment: Glucose reference range applies only to samples taken after fasting for at least 8 hours.  Sodium, urine, random     Status: None   Collection Time: 04/04/21 10:17 PM  Result Value Ref Range   Sodium, Ur 17 mmol/L    Comment: Performed at [facility] [address], [place], [place] [address]  Protein / creatinine ratio, urine     Status: Abnormal   Collection Time: 04/04/21 10:18 PM  Result Value Ref Range   Creatinine, Urine 98.33 mg/dL   Total Protein, Urine 43 mg/dL    Comment: NO NORMAL RANGE ESTABLISHED FOR THIS TEST   Protein Creatinine Ratio 0.44 (H) 0.00 - 0.15 mg/mg[Cre]    Comment: Performed at [facility], [place] [address], [place], [place] [address]  Heparin level (unfractionated)     Status: None   Collection Time: 04/05/21  3:24 AM  Result Value Ref Range   Heparin Unfractionated 0.42 0.30 - 0.70 IU/mL    Comment: (NOTE) The clinical reportable range upper limit is being  lowered to >1.10 to align with the FDA approved guidance for the current laboratory assay.  If heparin results are below expected values, and patient dosage has  been confirmed, suggest follow up testing of antithrombin III levels. Performed at [facility], [place] [address] [place],  [address]   CBC     Status: Abnormal   Collection Time: 04/05/21  3:24 AM  Result Value  Ref Range   WBC 12.8 (H) 4.0 - 10.5 K/uL   RBC 3.11 (L) 4.22 - 5.81 MIL/uL   Hemoglobin 10.1 (L) 13.0 - 17.0 g/dL   HCT 29.0 (L) 39.0 - 52.0 %   MCV 93.2 80.0 - 100.0 fL   MCH 32.5 26.0 - 34.0 pg   MCHC 34.8 30.0 - 36.0 g/dL   RDW 16.7 (H) 11.5 - 15.5 %   Platelets 162 150 - 400 K/uL   nRBC 0.0 0.0 - 0.2 %    Comment: Performed at [facility] [address] [place], [place] [address]  Comprehensive metabolic panel     Status: Abnormal   Collection Time: 04/05/21  3:24 AM  Result Value Ref Range   Sodium 131 (L) 135 - 145 mmol/L   Potassium 5.0 3.5 - 5.1 mmol/L   Chloride 104 98 - 111 mmol/L   CO2 18 (L) 22 - 32 mmol/L   Glucose, Bld 267 (H) 70 - 99 mg/dL    Comment: Glucose reference range applies only to samples taken after fasting for at least 8 hours.   BUN 55 (H) 8 - 23 mg/dL   Creatinine, Ser 2.37 (H) 0.61 - 1.24 mg/dL   Calcium 8.9 8.9 - 10.3 mg/dL   Total Protein 6.6 6.5 - 8.1 g/dL   Albumin 2.6 (L) 3.5 - 5.0 g/dL   AST 121 (H) 15 - 41 U/L   ALT 79 (H) 0 - 44 U/L   Alkaline Phosphatase 174 (H) 38 - 126 U/L   Total Bilirubin 9.2 (H) 0.3 - 1.2 mg/dL   GFR, Estimated 29 (L) >60 mL/min    Comment: (NOTE) Calculated using the CKD-EPI Creatinine Equation (2021)    Anion gap 9 5 - 15    Comment: Performed at [facility], [place] [address] [place], [place] [address]  Protime-INR     Status: Abnormal   Collection Time: 04/05/21  3:24 AM  Result Value Ref Range   Prothrombin Time 21.9 (H) 11.4 - 15.2 seconds   INR 1.9 (H) 0.8 - 1.2    Comment: (NOTE) INR goal varies based on  device and disease states. Performed at [facility], [place] [address] [place], [place] [address]   Glucose, capillary     Status: Abnormal   Collection Time: 04/05/21  7:58 AM  Result Value Ref Range   Glucose-Capillary 246 (H) 70 - 99 mg/dL    Comment: Glucose reference range applies only to samples taken after fasting for at least 8 hours.  Glucose, capillary     Status: Abnormal   Collection Time: 04/05/21 11:34 AM  Result Value Ref Range   Glucose-Capillary 304 (H) 70 - 99 mg/dL    Comment: Glucose reference range applies only to samples taken after fasting for at least 8 hours.      I have reviewed all relevant outside healthcare records related to the patient's current hospitalization

## 2021-04-05 NOTE — Progress Notes (Signed)
Physical Therapy Treatment Patient Details Name: Jerry [name] MRN: [id number] DOB: 12/21/1950 Today's Date: 04/05/2021   History of Present Illness 70 y.o. male admitted on 04/01/21 for abdominal pain, jaundice, and dizziness.  Pt s/p ERCP on 9/28.  Pt dx with hyperbilirubinemia, non-alcoholic cirrhosis, 51m R liver lesion, coagulopathy s/p 2u FFP, dizziness, acute hepatic encephalopathy, hypoalbuminemia, severe protein calorie malnutrition.  Pt with significant PMH of DM, HTN, portal vein thrombosis, CAD, HTN, PTSD, paracentesis, L reverse TSA.    PT Comments    Patient received in bed, sleeping but easily woken; able to get OOB to recliner but required as much as ModA for transfers and mobility from standard height bed. Does well and with less dizziness when focusing on a target/"A" when moving, but still limited by dizziness/nausea. Worked on VOR exercises and saccade exercises in the chair and left him with frequency and instructions for how to perform throughout the day. Seems a bit defeated today. Left up in recliner with all needs met, chair alarm active. Will continue efforts.     Recommendations for follow up therapy are one component of a multi-disciplinary discharge planning process, led by the attending physician.  Recommendations may be updated based on patient status, additional functional criteria and insurance authorization.  Follow Up Recommendations  Supervision/Assistance - 24 hour;SNF;Other (comment) (vestibular PT)     Equipment Recommendations  Rolling walker with 5" wheels    Recommendations for Other Services       Precautions / Restrictions Precautions Precautions: Fall Restrictions Weight Bearing Restrictions: No     Mobility  Bed Mobility Overal bed mobility: Needs Assistance Bed Mobility: Supine to Sit     Supine to sit: Mod assist;HOB elevated     General bed mobility comments: needed ModA to boost trunk all the way to EOB- had momentary  posterior LOB with difficulty correcting due to dizziness    Transfers Overall transfer level: Needs assistance Equipment used: Rolling walker (2 wheeled) Transfers: Sit to/from Stand Sit to Stand: Mod assist Stand pivot transfers: Min assist       General transfer comment: able to power up to standing with ModA, then once balanced and focusing on targets/"A", able to pivot to recliner with MinA  Ambulation/Gait             General Gait Details: deferred   Stairs             Wheelchair Mobility    Modified Rankin (Stroke Patients Only)       Balance Overall balance assessment: Needs assistance Sitting-balance support: Feet supported Sitting balance-Leahy Scale: Fair Sitting balance - Comments: limited dynamic due to dizziness   Standing balance support: Bilateral upper extremity supported;During functional activity Standing balance-Leahy Scale: Poor Standing balance comment: relies on BUE support                            Cognition Arousal/Alertness: Awake/alert Behavior During Therapy: WFL for tasks assessed/performed Overall Cognitive Status: Impaired/Different from baseline Area of Impairment: Problem solving;Memory                     Memory: Decreased short-term memory       Problem Solving: Slow processing;Requires verbal cues General Comments: slow processing and requires increased time to complete all tasks; had poor memory of past PT sessions and could not remember vestibular techniques we performed      Exercises      General Comments  General comments (skin integrity, edema, etc.): limited by dizziness      Pertinent Vitals/Pain Pain Assessment: Faces Faces Pain Scale: Hurts a little bit Pain Location: generalized Pain Descriptors / Indicators: Grimacing;Guarding;Aching Pain Intervention(s): Limited activity within patient's tolerance;Monitored during session    Home Living                       Prior Function            PT Goals (current goals can now be found in the care plan section) Acute Rehab PT Goals Patient Stated Goal: to feel better PT Goal Formulation: With patient Time For Goal Achievement: 04/17/21 Potential to Achieve Goals: Good Progress towards PT goals: Progressing toward goals    Frequency    Min 4X/week      PT Plan Current plan remains appropriate    Co-evaluation              AM-PAC PT "6 Clicks" Mobility   Outcome Measure  Help needed turning from your back to your side while in a flat bed without using bedrails?: A Little Help needed moving from lying on your back to sitting on the side of a flat bed without using bedrails?: A Little Help needed moving to and from a bed to a chair (including a wheelchair)?: A Little Help needed standing up from a chair using your arms (e.g., wheelchair or bedside chair)?: A Lot Help needed to walk in hospital room?: A Lot Help needed climbing 3-5 steps with a railing? : A Lot 6 Click Score: 15    End of Session Equipment Utilized During Treatment: Gait belt Activity Tolerance: Patient tolerated treatment well Patient left: with call bell/phone within reach;in chair;with chair alarm set Nurse Communication: Mobility status PT Visit Diagnosis: Muscle weakness (generalized) (M62.81);Dizziness and giddiness (R42);Difficulty in walking, not elsewhere classified (R26.2)     Time: 2957-4734 PT Time Calculation (min) (ACUTE ONLY): 43 min  Charges:  $Therapeutic Activity: 23-37 mins $Neuromuscular Re-education: 8-22 mins                    Windell [place], DPT, PN2   Supplemental Physical Therapist [place]    Pager [phone] Acute Rehab Office [phone]

## 2021-04-06 ENCOUNTER — Inpatient Hospital Stay (HOSPITAL_COMMUNITY): Payer: No Typology Code available for payment source

## 2021-04-06 LAB — HEPATIC FUNCTION PANEL
ALT: 90 U/L — ABNORMAL HIGH (ref 0–44)
AST: 139 U/L — ABNORMAL HIGH (ref 15–41)
Albumin: 2.3 g/dL — ABNORMAL LOW (ref 3.5–5.0)
Alkaline Phosphatase: 184 U/L — ABNORMAL HIGH (ref 38–126)
Bilirubin, Direct: 4.7 mg/dL — ABNORMAL HIGH (ref 0.0–0.2)
Indirect Bilirubin: 3.9 mg/dL — ABNORMAL HIGH (ref 0.3–0.9)
Total Bilirubin: 8.6 mg/dL — ABNORMAL HIGH (ref 0.3–1.2)
Total Protein: 6.2 g/dL — ABNORMAL LOW (ref 6.5–8.1)

## 2021-04-06 LAB — CBC
HCT: 27.4 % — ABNORMAL LOW (ref 39.0–52.0)
HCT: 27.9 % — ABNORMAL LOW (ref 39.0–52.0)
Hemoglobin: 9.6 g/dL — ABNORMAL LOW (ref 13.0–17.0)
Hemoglobin: 9.5 g/dL — ABNORMAL LOW (ref 13.0–17.0)
MCH: 32.5 pg (ref 26.0–34.0)
MCH: 32 pg (ref 26.0–34.0)
MCHC: 35 g/dL (ref 30.0–36.0)
MCHC: 34.1 g/dL (ref 30.0–36.0)
MCV: 92.9 fL (ref 80.0–100.0)
MCV: 93.9 fL (ref 80.0–100.0)
Platelets: 157 10*3/uL (ref 150–400)
Platelets: 148 10*3/uL — ABNORMAL LOW (ref 150–400)
RBC: 2.95 MIL/uL — ABNORMAL LOW (ref 4.22–5.81)
RBC: 2.97 MIL/uL — ABNORMAL LOW (ref 4.22–5.81)
RDW: 17.7 % — ABNORMAL HIGH (ref 11.5–15.5)
RDW: 17.8 % — ABNORMAL HIGH (ref 11.5–15.5)
WBC: 8.9 10*3/uL (ref 4.0–10.5)
WBC: 7.9 10*3/uL (ref 4.0–10.5)
nRBC: 0 % (ref 0.0–0.2)
nRBC: 0 % (ref 0.0–0.2)

## 2021-04-06 LAB — RENAL FUNCTION PANEL
Albumin: 2.3 g/dL — ABNORMAL LOW (ref 3.5–5.0)
Anion gap: 8 (ref 5–15)
BUN: 51 mg/dL — ABNORMAL HIGH (ref 8–23)
CO2: 19 mmol/L — ABNORMAL LOW (ref 22–32)
Calcium: 8.8 mg/dL — ABNORMAL LOW (ref 8.9–10.3)
Chloride: 106 mmol/L (ref 98–111)
Creatinine, Ser: 1.63 mg/dL — ABNORMAL HIGH (ref 0.61–1.24)
GFR, Estimated: 45 mL/min — ABNORMAL LOW (ref >60)
Glucose, Bld: 252 mg/dL — ABNORMAL HIGH (ref 70–99)
Phosphorus: 2.6 mg/dL (ref 2.5–4.6)
Potassium: 4.9 mmol/L (ref 3.5–5.1)
Sodium: 133 mmol/L — ABNORMAL LOW (ref 135–145)

## 2021-04-06 LAB — PROTIME-INR
INR: 1.9 — ABNORMAL HIGH (ref 0.8–1.2)
Prothrombin Time: 21.9 seconds — ABNORMAL HIGH (ref 11.4–15.2)

## 2021-04-06 LAB — OCCULT BLOOD X 1 CARD TO LAB, STOOL: Fecal Occult Bld: POSITIVE — AB

## 2021-04-06 LAB — HEPARIN LEVEL (UNFRACTIONATED): Heparin Unfractionated: 0.3 IU/mL (ref 0.30–0.70)

## 2021-04-06 LAB — GLUCOSE, CAPILLARY
Glucose-Capillary: 212 mg/dL — ABNORMAL HIGH (ref 70–99)
Glucose-Capillary: 226 mg/dL — ABNORMAL HIGH (ref 70–99)
Glucose-Capillary: 239 mg/dL — ABNORMAL HIGH (ref 70–99)
Glucose-Capillary: 152 mg/dL — ABNORMAL HIGH (ref 70–99)

## 2021-04-06 LAB — AMMONIA: Ammonia: 113 umol/L — ABNORMAL HIGH (ref 9–35)

## 2021-04-06 MED ORDER — PNEUMOCOCCAL VAC POLYVALENT 25 MCG/0.5ML IJ INJ
0.5000 mL | INJECTION | INTRAMUSCULAR | Status: AC
  Administered 2021-04-07: 0.5 mL via INTRAMUSCULAR
  Filled 2021-04-06: qty 0.5

## 2021-04-06 MED ORDER — LACTULOSE 10 GM/15ML PO SOLN
30.0000 g | Freq: Three times a day (TID) | ORAL | Status: DC
  Administered 2021-04-06 – 2021-04-07 (×2): 30 g via ORAL
  Filled 2021-04-06 (×3): qty 45

## 2021-04-06 MED ORDER — LACTULOSE 10 GM/15ML PO SOLN: 30.0000 g | Freq: Two times a day (BID) | ORAL | Status: DC

## 2021-04-06 MED ORDER — INFLUENZA VAC A&B SA ADJ QUAD 0.5 ML IM PRSY
0.5000 mL | PREFILLED_SYRINGE | INTRAMUSCULAR | Status: AC
  Administered 2021-04-07: 0.5 mL via INTRAMUSCULAR
  Filled 2021-04-06: qty 0.5

## 2021-04-06 NOTE — Progress Notes (Signed)
Nephrology Follow-Up Consult note   Assessment/Recommendations: Jerry [name] is a/an 70 y.o. 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  Less likely HRS.  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 -Continue with supportive care, hold fluids for now -Continue to monitor daily Cr, Dose meds for GFR -Monitor Daily I/Os, Daily weight  -Maintain MAP>65 for optimal renal perfusion.  -Avoid nephrotoxic medications including NSAIDs and Vanc/Zosyn combo -RUS has been ordered -Currently no indication for HD -Agree with holding diuretics  Given his improvement we will sign off at this time.  If further assistance is needed please let us know.   Volume Status: Appears relatively euvolemic at this time.  Hold further fluids   Hypertension: Holding home medications and diuretics   Abdominal pain: mgmt per primary  Dark stools: concerning for GI bleeding, defer to GI.   Decompensated cirrhosis: GI following.  Defer to them   Anemia: Hemoglobin 10.  Likely multifactorial.  Continue to monitor   Uncontrolled Diabetes Mellitus Type 2 with Hyperglycemia; management per primary team    Recommendations conveyed to primary service.    [facility] 04/06/2021 11:00 AM  ___________________________________________________________  CC: AKI  Interval History/Subjective: Patient states he feels about the same today.  Continues to have abdominal pain largely unchanged.  Starting to have some dark tarry stools.  Urinating well without any problems.   Medications:  Current Facility-Administered Medications  Medication Dose Route Frequency Provider Last Rate Last Admin   acetaminophen  (TYLENOL) tablet 650 mg  650 mg Oral Q6H PRN Milus Banister, MD   650 mg at 04/02/21 1542   Or   acetaminophen (TYLENOL) suppository 650 mg  650 mg Rectal Q6H PRN Milus Banister, MD       albuterol (PROVENTIL) (2.5 MG/3ML) 0.083% nebulizer solution 2.5 mg  2.5 mg Nebulization Q6H PRN Milus Banister, MD       carvedilol (COREG) tablet 3.125 mg  3.125 mg Oral BID Milus Banister, MD   3.125 mg at 04/06/21 5621   cetirizine (ZYRTEC) tablet 5 mg  5 mg Oral Daily Milus Banister, MD   5 mg at 04/06/21 0933   dextrose 50 % solution 50 mL  1 ampule Intravenous PRN Milus Banister, MD   50 mL at 04/02/21 3086   diphenhydrAMINE (BENADRYL) capsule 25 mg  25 mg Oral Q6H PRN Patrecia Pour, MD       feeding supplement (ENSURE ENLIVE / ENSURE PLUS) liquid 237 mL  237 mL Oral TID BM Vance Gather B, MD   237 mL at 04/06/21 0930   heparin ADULT infusion 100 units/mL (25000 units/232m)  1,500 Units/hr Intravenous Continuous BHenri Medal RPH 15 mL/hr at 04/05/21 0331 1,500 Units/hr at 04/05/21 0331   hydrocortisone-pramoxine (PROCTOFOAM-HC) rectal foam 1 applicator  1 applicator Rectal QID PRN JMilus Banister MD       indomethacin (INDOCIN) 50 MG suppository 100 mg  100 mg Rectal Once JMilus Banister MD       [START ON 04/07/2021] influenza vaccine adjuvanted (FLUAD) injection 0.5 mL  0.5 mL Intramuscular Tomorrow-1000 Memon, JJolaine Artist MD       insulin aspart (novoLOG) injection 0-20 Units  0-20 Units Subcutaneous TID  WC Kathie Dike, MD   7 Units at 04/06/21 0931   insulin aspart (novoLOG) injection 0-5 Units  0-5 Units Subcutaneous QHS Patrecia Pour, MD   3 Units at 04/04/21 2201   isosorbide mononitrate (IMDUR) 24 hr tablet 90 mg  90 mg Oral q morning Milus Banister, MD   90 mg at 04/06/21 0953   lactulose (CHRONULAC) 10 GM/15ML solution 20 g  20 g Oral BID Patrecia Pour, MD   20 g at 04/06/21 0934   loratadine (CLARITIN) tablet 10 mg  10 mg Oral QHS Milus Banister, MD   10 mg at 04/05/21 2146    multivitamin with minerals tablet 1 tablet  1 tablet Oral Daily Vance Gather B, MD   1 tablet at 04/06/21 0932   ondansetron (ZOFRAN) tablet 4 mg  4 mg Oral Q6H PRN Milus Banister, MD       Or   ondansetron [facility]) injection 4 mg  4 mg Intravenous Q6H PRN Milus Banister, MD   4 mg at 04/03/21 1745   pantoprazole (PROTONIX) EC tablet 40 mg  40 mg Oral BID Milus Banister, MD   40 mg at 04/06/21 0934   [START ON 04/07/2021] pneumococcal 23 valent vaccine (PNEUMOVAX-23) injection 0.5 mL  0.5 mL Intramuscular Tomorrow-1000 Kathie Dike, MD       polyethylene glycol (MIRALAX / GLYCOLAX) packet 8.5 g  8.5 g Oral Daily PRN Milus Banister, MD       sodium chloride flush (NS) 0.9 % injection 3 mL  3 mL Intravenous Q12H Milus Banister, MD   3 mL at 04/06/21 0930   triamcinolone cream (KENALOG) 0.1 % cream 1 application  1 application Topical BID PRN Milus Banister, MD   1 application at 79/89/21 1941      Review of Systems: 10 systems reviewed and negative except per interval history/subjective  Physical Exam: Vitals:   04/05/21 1950 04/06/21 0757  BP: 122/63 (!) 132/58  Pulse:  62  Resp:  18  Temp:  97.6 F (36.4 C)  SpO2: 100% 99%   No intake/output data recorded.  Intake/Output Summary (Last 24 hours) at 04/06/2021 1100 Last data filed at 04/05/2021 1847 Gross per 24 hour  Intake 480 ml  Output 1000 ml  Net -520 ml   Constitutional: well-appearing, no acute distress ENMT: Scleral icterus present, ears and nose without scars or lesions, MMM CV: normal rate, no edema Respiratory: Bilateral chest rise normal work of breathing Gastrointestinal: Mild distention present, bowel sounds present Skin: Jaundice but otherwise no visible lesions or rashes Psych: alert, judgement/insight appropriate, appropriate mood and affect   Test Results I personally reviewed new and old clinical labs and radiology tests Lab Results  Component Value Date   NA 133 (L) 04/06/2021   K 4.9  04/06/2021   CL 106 04/06/2021   CO2 19 (L) 04/06/2021   BUN 51 (H) 04/06/2021   CREATININE 1.63 (H) 04/06/2021   CALCIUM 8.8 (L) 04/06/2021   ALBUMIN 2.3 (L) 04/06/2021   PHOS 2.6 04/06/2021

## 2021-04-06 NOTE — Progress Notes (Signed)
PROGRESS NOTE  Jerry [name]  KDX:[id number] DOB: 02-Oct-1950 DOA: 04/01/2021 PCP: Deberah Pelton, MD - Most of care at [facility].   Brief Narrative: Jerry [name] is a 70 y.o. male with a history of nonalcoholic hepatic cirrhosis, PVT, ascites, kidney cancer, CAD s/p stenting x2, and GI bleed who presented to the ED with lower abdominal pain, jaundice, and dizziness. In the ED he had stable vital signs. WBC 10.6, sodium 132, BUN 27, glucose 77, alkaline phosphatase 264->208, albumin 2.2,  lipase 90, AST 149-> 138, ALT 105-> 92, total bilirubin 10.8->10, and ammonia 72.  CT scan of the abdomen pelvis with contrast noted coronary artery calcification, advanced cirrhosis with interval thrombosis of the portal vein and evidence of portal venous hypertension with splenomegaly, mild stable ascites, cholelithiasis, and circumferential thickening of the cecum along with ascending colon reflecting portal colopathy.  Urinalysis significant for elevated specific gravity of 1.036 but no signs of infection noted.  He was admitted with GI consult and underwent an ERCP on 9/28.  Assessment & Plan: Principal Problem:   Choledocholithiasis with obstruction Active Problems:   Portal vein thrombosis   Type 2 diabetes mellitus without complication, without long-term current use of insulin (HCC)   Essential hypertension   Liver cirrhosis secondary to NASH (HCC)   Transaminitis   Jaundice   Leukocytosis   GERD (gastroesophageal reflux disease)   Decompensated liver disease (HCC)   Hyponatremia   AKI (acute kidney injury) ([place])   Malnutrition of moderate degree  Hyperbilirubinemia, LFT elevations: Most likely related to progressive hepatic dysfunction. With suggestion of filling defect on imaging, ERCP was pursued 9/28 with performance of sphincterotomy and sweeping with no stone passed, suspect air bubble.    -LFTs appear to be slowly trending down  Nonalcoholic cirrhosis with portal vein thrombosis, portal  HTN, ascites, esophageal varices: MELD score is 24 with 19.6% 21-monthestimated mortality.  - Continue home nonselective beta blocker (coreg) - Hold lasix, spironolactone for now with rising Cr.   Possible GI bleeding -Noted to have dark-colored stools this morning -Continue to follow hemoglobin -Check FOBT -Hold anticoagulation for now -He is already on PPI twice daily  AKI: Concern for HRS. Prerenal azotemia possible with NPO and diuretics 9/29, cipro periprocedurally may also contribute, and pt did receive 853mIV contrast for CT abd/pelvis 9/27. Note hx renal cancer s/p partial nephrectomy. -Creatinine trended up to 2.3 on 9/30 -Seen by nephrology, appreciate input.  Felt to be possibly contrast-induced nephropathy -Overall creatinine has since been trending down, currently 1.6  22m322might liver lesion: Seen on CT (report below):  - Definitively needs follow-up MRI with and without contrast in 3 months  Coagulopathy: due to hepatic synthetic dysfunction, still at very high risk of clotting, has an active clot, and is at high risk of life threatening bleeding from large varices.  Thrombocytopenia and Anemia of chronic disease: Stable.   Dizziness: Suspect diminished EABV contributing, s/p IVF, though he reports reports vestibular-sounding symptoms and has consistent exam for BPPV.  - Continue vestibular rehab. This puts pt at such a high fall risk that he cannot be without supervision for mobility.  -MRI brain did not show any acute abnormality, but did show tiny chronic right cerebellar infarct   Leukocytosis: Acute.  WBC elevated 10.6.  Chest x-ray noted low lung volumes with likely atelectasis and urinalysis did not show signs of infection.  Urine culture nonclonal. Denies having any significant fever or chills. - Resolved on recheck.   Portal vein  thrombosis: Chronic, Dx 2017.  Patient noted to have collaterals on CT imaging. -Patient was being treated with heparin  infusion -Discussed with Dr. 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  Ammonia level noted to be 72 on admission, but now has b trended up to 113.   -Increase lactulose dosing   T2DM: HbA1c 8.8%.  - Hold metformin, glipizide.  - SSI - Hypoglycemic protocol in place.   HTN: - Continue low-dose coreg as tolerated, though we have to avoid hypotension.   Hyperlipidemia:  - Previously on statin. Not giving currently in light of elevated LFTs.   Hypoalbuminemia: Albumin noted to be as low as 1.9 on admission. -related to cirrhosis -he did receive albumin infusion on 9/29   Moderate protein calorie malnutrition: Prealbumin 5.7. Severe progressive liver disease contributing.  - Dietitian consult  GERD - Continue PPI  Goals of care -discussed patient's wishes regarding end of life and his desire to undergo CPR/resuscitation of the need ever arose.  -discussed the probability of a poor outcome, considering his baseline progressive liver disease -patient agrees to DNR status at this time, but wishes to continue with all available treatments up until that point.  DVT prophylaxis: Holding anticoagulation in light of bleeding Code Status: DNR Family Communication: None at bedside Disposition Plan:  Status is: Inpatient  Remains inpatient appropriate because:Ongoing diagnostic testing needed not appropriate for outpatient work up and Inpatient level of care appropriate due to severity of illness  Dispo: The patient is from: Home              Anticipated d/c is to: SNF              Patient currently is not medically stable to d/c.  Consultants:  [facility]  Procedures:  ERCP 04/03/2021 Dr. Ardis Hughs:  Impression:       - Large  esophageal varices (without signs of recent                            or impending bleeding) and moderate portal                            gastropathy.                           - Relatively small major papilla.                           - Nondilated biliary tree including common bile                            duct, common hepatic duct. The cystic duct                            opacified and was also normal. A small mobile                            filling defect noted on cholangiogram correlated  with MRI suggested bile duct stone. This was                            treated with biliary sphincterotomy and balloon                            sweeping however no stones were delivered and I                            suspect that the filling defect noted today was                            probably a small air bubble. Recommendation: - Return to hospital room. I will allow low salt                            diet.                           - Follow liver tests, I think his overall clinical                            picture is that of decompensation of known liver                            disease.                           - Consider starting non-selective B blocker for his                            large esophageal varices.                           - OK to resume his heparin at 6pm tonight however                            please note that blood thinners in the setting of                            portal gastropathy and large esophageal varices may                            be risky.  Antimicrobials: Cipro surgical ppx 9/28  Subjective: Reports noting black stool this morning.  He denies any abdominal pain.  No vomiting.  Objective: Vitals:   04/05/21 1950 04/06/21 0757 04/06/21 1317 04/06/21 1628  BP: 122/63 (!) 132/58 (!) 104/46 (!) 118/58  Pulse:  62 64 66  Resp:  18 16   Temp:  97.6 F (36.4 C) 97.8 F (36.6 C) 98.3 F (36.8 C)   TempSrc:  Oral Oral Oral  SpO2: 100% 99% 98% 99%  Weight:      Height:        Intake/Output Summary (Last 24 hours) at 04/06/2021 1949 Last data filed at 04/06/2021 1855 Gross per 24 hour  Intake --  Output 650 ml  Net -650 ml   Filed Weights   04/02/21 1200 04/02/21 1311 04/03/21 1246  Weight: 82.1 kg 82.1 kg 82.1 kg   General exam: Alert, awake, oriented x 3 Respiratory system: Clear to auscultation. Respiratory effort normal. Cardiovascular system:RRR. No murmurs, rubs, gallops. Gastrointestinal system: Abdomen is nondistended, soft and nontender. No organomegaly or masses felt. Normal bowel sounds heard. Central nervous system: Alert and oriented. No focal neurological deficits. Extremities: No C/C/E, +pedal pulses Skin: No rashes, lesions or ulcers Psychiatry: Judgement and insight appear normal. Mood & affect appropriate.     Data Reviewed: I have personally reviewed following labs and imaging studies  CBC: Recent Labs  Lab 04/01/21 2303 04/04/21 0541 04/04/21 0815 04/05/21 0324 04/06/21 0250 04/06/21 1324  WBC 10.6* 6.3 9.2 12.8* 8.9 7.9  NEUTROABS 7.4  --   --   --   --   --   HGB 13.2 11.5* 11.7* 10.1* 9.6* 9.5*  HCT 38.4* 32.6* 33.8* 29.0* 27.4* 27.9*  MCV 95.5 91.8 92.3 93.2 92.9 93.9  PLT 211 111* 134* 162 157 295*   Basic Metabolic Panel: Recent Labs  Lab 04/01/21 2303 04/03/21 0325 04/04/21 0541 04/05/21 0324 04/06/21 0250  NA 132* 133* 127* 131* 133*  K 4.3 4.5 4.8 5.0 4.9  CL 107 108 101 104 106  CO2 19* 19* 16* 18* 19*  GLUCOSE 77 73 367* 267* 252*  BUN 27* 22 36* 55* 51*  CREATININE 1.16 1.37* 1.80* 2.37* 1.63*  CALCIUM 9.0 8.5* 8.6* 8.9 8.8*  PHOS  --   --   --   --  2.6   GFR: Estimated Creatinine Clearance: 44.3 mL/min (A) (by C-G formula based on SCr of 1.63 mg/dL (H)). Liver Function Tests: Recent Labs  Lab 04/02/21 0546 04/03/21 0325 04/04/21 0541 04/05/21 0324 04/06/21 0250  AST 138* 145* 135* 121* 139*  ALT 92* 84*  84* 79* 90*  ALKPHOS 208* 204* 193* 174* 184*  BILITOT 10.0* 10.9* 10.1* 9.2* 8.6*  PROT 6.8 6.5 6.5 6.6 6.2*  ALBUMIN 1.9* 1.8* 1.9* 2.6* 2.3*  2.3*   Recent Labs  Lab 04/01/21 2303  LIPASE 90*   Recent Labs  Lab 04/02/21 0103 04/06/21 1324  AMMONIA 72* 113*   Coagulation Profile: Recent Labs  Lab 04/03/21 0639 04/03/21 1518 04/04/21 0541 04/05/21 0324 04/06/21 0250  INR 1.7* 1.6* 2.0* 1.9* 1.9*   Cardiac Enzymes: No results for input(s): CKTOTAL, CKMB, CKMBINDEX, TROPONINI in the last 168 hours. BNP (last 3 results) No results for input(s): PROBNP in the last 8760 hours. HbA1C: No results for input(s): HGBA1C in the last 72 hours.  CBG: Recent Labs  Lab 04/05/21 1134 04/05/21 2142 04/06/21 0822 04/06/21 1308 04/06/21 1545  GLUCAP 304* 199* 226* 239* 152*   Lipid Profile: No results for input(s): CHOL, HDL, LDLCALC, TRIG, CHOLHDL, LDLDIRECT in the last 72 hours. Thyroid Function Tests: No results for input(s): TSH, T4TOTAL, FREET4, T3FREE, THYROIDAB in the last 72 hours. Anemia Panel: No results for input(s): VITAMINB12, FOLATE, FERRITIN, TIBC, IRON, RETICCTPCT in the last 72 hours. Urine analysis:    Component Value Date/Time   COLORURINE AMBER (A) 04/02/2021 0546   APPEARANCEUR CLEAR 04/02/2021 0546   LABSPEC 1.036 (H) 04/02/2021 0546   PHURINE 5.0 04/02/2021 0546   GLUCOSEU NEGATIVE 04/02/2021 0546   HGBUR NEGATIVE 04/02/2021 0546   BILIRUBINUR SMALL (A) 04/02/2021 0546   KETONESUR NEGATIVE 04/02/2021 0546   PROTEINUR NEGATIVE 04/02/2021 0546   NITRITE NEGATIVE 04/02/2021 0546  LEUKOCYTESUR NEGATIVE 04/02/2021 0546   Recent Results (from the past 240 hour(s))  Urine Culture     Status: Abnormal   Collection Time: 04/02/21  5:46 AM   Specimen: Urine, Clean Catch  Result Value Ref Range Status   Specimen Description URINE, CLEAN CATCH  Final   Special Requests   Final    NONE Performed at [facility], [address],  [place], [place] [address]    Culture MULTIPLE SPECIES PRESENT, SUGGEST RECOLLECTION (A)  Final   Report Status 04/02/2021 FINAL  Final  Resp Panel by RT-PCR (Flu A&B, Covid) Nasopharyngeal Swab     Status: None   Collection Time: 04/02/21  5:46 AM   Specimen: Nasopharyngeal Swab; Nasopharyngeal(NP) swabs in vial transport medium  Result Value Ref Range Status   SARS Coronavirus 2 by RT PCR NEGATIVE NEGATIVE Final    Comment: (NOTE) SARS-CoV-2 target nucleic acids are NOT DETECTED.  The SARS-CoV-2 RNA is generally detectable in upper respiratory specimens during the acute phase of infection. The lowest concentration of SARS-CoV-2 viral copies this assay can detect is 138 copies/mL. A negative result does not preclude SARS-Cov-2 infection and should not be used as the sole basis for treatment or other patient management decisions. A negative result may occur with  improper specimen collection/handling, submission of specimen other than nasopharyngeal swab, presence of viral mutation(s) within the areas targeted by this assay, and inadequate number of viral copies(<138 copies/mL). A negative result must be combined with clinical observations, patient history, and epidemiological information. The expected result is Negative.  Fact Sheet for Patients:  EntrepreneurPulse.com.au  Fact Sheet for Healthcare Providers:  IncredibleEmployment.be  This test is no t yet approved or cleared by the [place] FDA and  has been authorized for detection and/or diagnosis of SARS-CoV-2 by FDA under an Emergency Use Authorization (EUA). This EUA will remain  in effect (meaning this test can be used) for the duration of the COVID-19 declaration under Section 564(b)(1) of the Act, 21 U.S.C.section 360bbb-3(b)(1), unless the authorization is terminated  or revoked sooner.       Influenza A by PCR NEGATIVE NEGATIVE Final   Influenza B by PCR NEGATIVE NEGATIVE Final     Comment: (NOTE) The Xpert Xpress SARS-CoV-2/FLU/RSV plus assay is intended as an aid in the diagnosis of influenza from Nasopharyngeal swab specimens and should not be used as a sole basis for treatment. Nasal washings and aspirates are unacceptable for Xpert Xpress SARS-CoV-2/FLU/RSV testing.  Fact Sheet for Patients: EntrepreneurPulse.com.au  Fact Sheet for Healthcare Providers: IncredibleEmployment.be  This test is not yet approved or cleared by the [place] FDA and has been authorized for detection and/or diagnosis of SARS-CoV-2 by FDA under an Emergency Use Authorization (EUA). This EUA will remain in effect (meaning this test can be used) for the duration of the COVID-19 declaration under Section 564(b)(1) of the Act, 21 U.S.C. section 360bbb-3(b)(1), unless the authorization is terminated or revoked.  Performed at [facility], [place] [address], [place], [place] [address]   Surgical pcr screen     Status: None   Collection Time: 04/03/21  8:37 AM   Specimen: Nasal Mucosa; Nasal Swab  Result Value Ref Range Status   MRSA, PCR NEGATIVE NEGATIVE Final   Staphylococcus aureus NEGATIVE NEGATIVE Final    Comment: (NOTE) The Xpert SA Assay (FDA approved for NASAL specimens in patients 55 years of age and older), is one component of a comprehensive surveillance program. It is not  intended to diagnose infection nor to guide or monitor treatment. Performed at [facility], [place] [address] [place], [place] [address]       Radiology Studies: MR BRAIN WO CONTRAST  Result Date: 04/06/2021 CLINICAL DATA:  Dizziness, persistent/recurrent, cardiac or vascular cause suspected. History of cirrhosis. EXAM: MRI HEAD WITHOUT CONTRAST TECHNIQUE: Multiplanar, multiecho pulse sequences of the brain and surrounding structures were obtained without intravenous contrast. COMPARISON:  None. FINDINGS: Brain: There is no evidence of an acute  infarct, intracranial hemorrhage, mass, midline shift, or extra-axial fluid collection. Scattered small T2 hyperintensities in the cerebral white matter bilaterally are nonspecific but compatible with minimal chronic small vessel ischemic disease. There is symmetric intrinsic T1 hyperintensity in the globi pallidi. A subcentimeter chronic infarct is noted in the right cerebellar hemisphere. There is mild generalized cerebral atrophy. Vascular: Major intracranial vascular flow voids are preserved. Skull and upper cervical spine: Unremarkable bone marrow signal. Sinuses/Orbits: Unremarkable orbits. Mild mucosal thickening in the paranasal sinuses. Trace right mastoid fluid. Other: None. IMPRESSION: 1. No acute intracranial abnormality. 2. Minimal chronic small vessel ischemic disease in the cerebral white matter. 3. Tiny chronic right cerebellar infarct. 4. T1 hyperintensity in the globi pallidi consistent with chronic liver disease. Electronically Signed   By: Logan Bores M.D.   On: 04/06/2021 13:35   US RENAL  Result Date: 04/05/2021 CLINICAL DATA:  Acute kidney injury and history of prior partial left nephrectomy. EXAM: RENAL / URINARY TRACT ULTRASOUND COMPLETE COMPARISON:  None. FINDINGS: Right Kidney: Renal measurements: 11.8 x 5.3 x 5.5 cm = volume: 179 mL. Echogenicity within normal limits. No mass or hydronephrosis visualized. Left Kidney: Renal measurements: 9.0 x 4.9 x 4.4 cm = volume: 101 mL. Scarring at the level of the upper pole likely is related to the history of partial nephrectomy. No evidence of focal mass or hydronephrosis. Bladder: Appears normal for degree of bladder distention. Other: None. IMPRESSION: No evidence of hydronephrosis bilaterally. Slightly smaller left kidney with upper pole scarring, likely reflecting the given history of prior partial left nephrectomy. Electronically Signed   By: Aletta Edouard M.D.   On: 04/05/2021 16:51    Scheduled Meds:  carvedilol  3.125 mg Oral BID    cetirizine  5 mg Oral Daily   feeding supplement  237 mL Oral TID BM   [START ON 04/07/2021] influenza vaccine adjuvanted  0.5 mL Intramuscular Tomorrow-1000   insulin aspart  0-20 Units Subcutaneous TID WC   insulin aspart  0-5 Units Subcutaneous QHS   isosorbide mononitrate  90 mg Oral q morning   lactulose  30 g Oral BID   loratadine  10 mg Oral QHS   multivitamin with minerals  1 tablet Oral Daily   pantoprazole  40 mg Oral BID   [START ON 04/07/2021] pneumococcal 23 valent vaccine  0.5 mL Intramuscular Tomorrow-1000   sodium chloride flush  3 mL Intravenous Q12H   Continuous Infusions:     LOS: 4 days   Time spent: 35 minutes.  Kathie Dike, MD [facility] [email or web address] 04/06/2021, 7:49 PM

## 2021-04-06 NOTE — Plan of Care (Signed)
  Problem: Education: Goal: Knowledge of General Education information will improve Description: Including pain rating scale, medication(s)/side effects and non-pharmacologic comfort measures Outcome: Progressing   Problem: Clinical Measurements: Goal: Ability to maintain clinical measurements within normal limits will improve Outcome: Progressing Goal: Will remain free from infection Outcome: Progressing Goal: Diagnostic test results will improve Outcome: Progressing   Problem: Nutrition: Goal: Adequate nutrition will be maintained Outcome: Progressing   Problem: Elimination: Goal: Will not experience complications related to bowel motility Outcome: Progressing Goal: Will not experience complications related to urinary retention Outcome: Progressing   Problem: Pain Managment: Goal: General experience of comfort will improve Outcome: Progressing   Problem: Safety: Goal: Ability to remain free from injury will improve Outcome: Progressing

## 2021-04-06 NOTE — Progress Notes (Signed)
Patient had black stool this morning... heparin was D/C by physician.

## 2021-04-06 NOTE — Progress Notes (Signed)
[place] for heparin Indication:  portal vein thrombosis  Allergies  Allergen Reactions   Atorvastatin Other (See Comments)    Unknown   Metoprolol Other (See Comments)    bradycardia   Penicillins Swelling    Swelling at injection site Has patient had a PCN reaction causing immediate rash, facial/tongue/throat swelling, SOB or lightheadedness with hypotension: No Has patient had a PCN reaction causing severe rash involving mucus membranes or skin necrosis: No Has patient had a PCN reaction that required hospitalization No Has patient had a PCN reaction occurring within the last 10 years: No If all of the above answers are "NO", then may proceed with Cephalosporin use.   Rosuvastatin Other (See Comments)    unknown   Simvastatin Other (See Comments)    unknown   Terazosin Other (See Comments)    Orthostatic hypotension    Patient Measurements: Height: 5' 11"  (180.3 cm) Weight: 82.1 kg (181 lb) IBW/kg (Calculated) : 75.3 Heparin Dosing Weight: 81.2 kg  Vital Signs: BP: 122/63 (09/30 1950)  Labs: Recent Labs    04/04/21 0541 04/04/21 0815 04/04/21 1856 04/05/21 0324 04/06/21 0250  HGB 11.5* 11.7*  --  10.1* 9.6*  HCT 32.6* 33.8*  --  29.0* 27.4*  PLT 111* 134*  --  162 157  APTT 160* 187*  --   --   --   LABPROT 22.8*  --   --  21.9* 21.9*  INR 2.0*  --   --  1.9* 1.9*  HEPARINUNFRC 0.21* 0.26* 0.38 0.42 0.30  CREATININE 1.80*  --   --  2.37* 1.63*     Estimated Creatinine Clearance: 44.3 mL/min (A) (by C-G formula based on SCr of 1.63 mg/dL (H)).   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 Confirmed with pt he was taking apixaban 2.6m twice daily and reports compliance. Apixaban held for procedure and started on heparin. Last dose apixaban 9/26 @10 :30. MCRP shows worsening PVT compared to 2017, possible acute on chronic thrombus. Pharmacy consulted for heparin.   Heparin held 9/28 7am for ERCP. Per GI MD, ok to restart at 18:00. ERCP found large esophageal varices (no bleeding noted) and moderate portal gastropathy.  Heparin level of 0.3 is therapeutic on heparin 1500 units/hr. Hgb 9.6. Plt 157. No bleeding noted.     Goal of Therapy:  Heparin level 0.3-0.5 units/mL with GI bleed risk  Monitor platelets by anticoagulation protocol: Yes   Plan:  Continue heparin to 1500 units/hr  Daily heparin levels ordered Monitor heparin level, CBC and s/s of bleeding  F/u long term anticoagulation plan - multiple difficulties with oral AC options given clinical scenario with GI bleed risk, liver dysfunction, and indication for  anticoagulation. There is limited data of DOAC use in portal vein thrombosis, concern of DOAC use in liver impairment, and liver dysfunction causing elevated INR which would impair warfarin dosing. Pharmacy technician unable to assess cost of enoxaparin as outpatient due to [facility].   Thank you for allowing pharmacy to be a part of this patient's care.  Lestine Box, PharmD PGY2 Infectious Diseases Pharmacy Resident   Please check [email or web address] for unit-specific pharmacy phone numbers

## 2021-04-07 DIAGNOSIS — K8071 Calculus of gallbladder and bile duct without cholecystitis with obstruction: Secondary | ICD-10-CM | POA: Diagnosis not present

## 2021-04-07 LAB — CBC
HCT: 29 % — ABNORMAL LOW (ref 39.0–52.0)
Hemoglobin: 10.1 g/dL — ABNORMAL LOW (ref 13.0–17.0)
MCH: 32.3 pg (ref 26.0–34.0)
MCHC: 34.8 g/dL (ref 30.0–36.0)
MCV: 92.7 fL (ref 80.0–100.0)
Platelets: 143 10*3/uL — ABNORMAL LOW (ref 150–400)
RBC: 3.13 MIL/uL — ABNORMAL LOW (ref 4.22–5.81)
RDW: 18.4 % — ABNORMAL HIGH (ref 11.5–15.5)
WBC: 7.2 10*3/uL (ref 4.0–10.5)
nRBC: 0.3 % — ABNORMAL HIGH (ref 0.0–0.2)

## 2021-04-07 LAB — COMPREHENSIVE METABOLIC PANEL
ALT: 114 U/L — ABNORMAL HIGH (ref 0–44)
AST: 173 U/L — ABNORMAL HIGH (ref 15–41)
Albumin: 2.3 g/dL — ABNORMAL LOW (ref 3.5–5.0)
Alkaline Phosphatase: 176 U/L — ABNORMAL HIGH (ref 38–126)
Anion gap: 8 (ref 5–15)
BUN: 38 mg/dL — ABNORMAL HIGH (ref 8–23)
CO2: 20 mmol/L — ABNORMAL LOW (ref 22–32)
Calcium: 9.1 mg/dL (ref 8.9–10.3)
Chloride: 107 mmol/L (ref 98–111)
Creatinine, Ser: 1.38 mg/dL — ABNORMAL HIGH (ref 0.61–1.24)
GFR, Estimated: 55 mL/min — ABNORMAL LOW (ref >60)
Glucose, Bld: 193 mg/dL — ABNORMAL HIGH (ref 70–99)
Potassium: 4.8 mmol/L (ref 3.5–5.1)
Sodium: 135 mmol/L (ref 135–145)
Total Bilirubin: 9.5 mg/dL — ABNORMAL HIGH (ref 0.3–1.2)
Total Protein: 6.2 g/dL — ABNORMAL LOW (ref 6.5–8.1)

## 2021-04-07 LAB — GLUCOSE, CAPILLARY
Glucose-Capillary: 119 mg/dL — ABNORMAL HIGH (ref 70–99)
Glucose-Capillary: 187 mg/dL — ABNORMAL HIGH (ref 70–99)
Glucose-Capillary: 327 mg/dL — ABNORMAL HIGH (ref 70–99)
Glucose-Capillary: 179 mg/dL — ABNORMAL HIGH (ref 70–99)

## 2021-04-07 LAB — PROTIME-INR
INR: 1.6 — ABNORMAL HIGH (ref 0.8–1.2)
Prothrombin Time: 19 seconds — ABNORMAL HIGH (ref 11.4–15.2)

## 2021-04-07 MED ORDER — MECLIZINE HCL 25 MG PO TABS
25.0000 mg | ORAL_TABLET | Freq: Three times a day (TID) | ORAL | Status: DC
  Administered 2021-04-07 – 2021-04-12 (×16): 25 mg via ORAL
  Filled 2021-04-07 (×17): qty 1

## 2021-04-07 MED ORDER — LACTULOSE 10 GM/15ML PO SOLN
20.0000 g | Freq: Two times a day (BID) | ORAL | Status: DC
  Administered 2021-04-08 – 2021-04-12 (×8): 20 g via ORAL
  Filled 2021-04-07 (×9): qty 30

## 2021-04-07 NOTE — Progress Notes (Addendum)
PROGRESS NOTE  Jerry [name]  YDX:[id number] DOB: 1950-12-26 DOA: 04/01/2021 PCP: Deberah Pelton, MD - Most of care at [facility].   Brief Narrative: Jerry [name] is a 70 y.o. male with a history of nonalcoholic hepatic cirrhosis, PVT, ascites, kidney cancer, CAD s/p stenting x2, and GI bleed who presented to the ED with lower abdominal pain, jaundice, and dizziness. In the ED he had stable vital signs. WBC 10.6, sodium 132, BUN 27, glucose 77, alkaline phosphatase 264->208, albumin 2.2,  lipase 90, AST 149-> 138, ALT 105-> 92, total bilirubin 10.8->10, and ammonia 72.  CT scan of the abdomen pelvis with contrast noted coronary artery calcification, advanced cirrhosis with interval thrombosis of the portal vein and evidence of portal venous hypertension with splenomegaly, mild stable ascites, cholelithiasis, and circumferential thickening of the cecum along with ascending colon reflecting portal colopathy.  Urinalysis significant for elevated specific gravity of 1.036 but no signs of infection noted.  He was admitted with GI consult and underwent an ERCP on 9/28.  Assessment & Plan: Principal Problem:   Choledocholithiasis with obstruction Active Problems:   Portal vein thrombosis   Type 2 diabetes mellitus without complication, without long-term current use of insulin (HCC)   Essential hypertension   Liver cirrhosis secondary to NASH (HCC)   Transaminitis   Jaundice   Leukocytosis   GERD (gastroesophageal reflux disease)   Decompensated liver disease (HCC)   Hyponatremia   AKI (acute kidney injury) ([place])   Malnutrition of moderate degree  Hyperbilirubinemia, LFT elevations: Most likely related to progressive hepatic dysfunction. With suggestion of filling defect on imaging, ERCP was pursued 9/28 with performance of sphincterotomy and sweeping with no stone passed, suspect air bubble.    -LFTs appear to be stable  Nonalcoholic cirrhosis with portal vein thrombosis, portal HTN, ascites,  esophageal varices: MELD score is 24 with 19.6% 30-monthestimated mortality.  - Continue home nonselective beta blocker (coreg) - Hold lasix, spironolactone for now with rising Cr.   GI bleeding -Noted to have dark-colored stools 10/2 -FOBT positive -Following hemoglobin which has been stable -Hold anticoagulation for now -He is already on PPI twice daily -Defer to GI regarding need for endoscopy -We will keep n.p.o. after midnight stay  AKI: Concern for HRS. Prerenal azotemia possible with NPO and diuretics 9/29, cipro periprocedurally may also contribute, and pt did receive 892mIV contrast for CT abd/pelvis 9/27. Note hx renal cancer s/p partial nephrectomy. -Creatinine trended up to 2.3 on 9/30 -Seen by nephrology, appreciate input.  Felt to be possibly contrast-induced nephropathy -Overall creatinine has since been trending down, currently 1.3  9m85might liver lesion: Seen on CT (report below):  - Definitively needs follow-up MRI with and without contrast in 3 months  Coagulopathy: due to hepatic synthetic dysfunction, still at very high risk of clotting, has an active clot, and is at high risk of life threatening bleeding from large varices.  Thrombocytopenia and Anemia of chronic disease: Stable.   Dizziness: Suspect diminished EABV contributing, s/p IVF, though he reports reports vestibular-sounding symptoms and has consistent exam for BPPV.  - Continue vestibular rehab. This puts pt at such a high fall risk that he cannot be without supervision for mobility.  -MRI brain did not show any acute abnormality -Suspect that symptoms are related to peripheral vertigo, will start on Antivert   Leukocytosis: Acute.  WBC elevated 10.6.  Chest x-ray noted low lung volumes with likely atelectasis and urinalysis did not show signs of infection.  Urine culture  nonclonal. Denies having any significant fever or chills. - Resolved on recheck.   Portal vein thrombosis: Chronic, Dx 2017.   Patient noted to have collaterals on CT imaging. -Patient was being treated with heparin infusion -Discussed with Dr. 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  Ammonia level noted to be 72 on admission, but subsequently trended up to 113.   -He has had 5-6 bowel movements today -We will reduce lactulose dosing   T2DM: HbA1c 8.8%.  - Hold metformin, glipizide.  - SSI - Hypoglycemic protocol in place.   HTN: - Continue low-dose coreg as tolerated, though we have to avoid hypotension.   Hyperlipidemia:  - Previously on statin. Not giving currently in light of elevated LFTs.   Hypoalbuminemia: Albumin noted to be as low as 1.9 on admission. -related to cirrhosis -he did receive albumin infusion on 9/29   Moderate protein calorie malnutrition: Prealbumin 5.7. Severe progressive liver disease contributing.  - Dietitian consult  GERD - Continue PPI  Goals of care -discussed patient's wishes regarding end of life and his desire to undergo CPR/resuscitation of the need ever arose.  -discussed the probability of a poor outcome, considering his baseline progressive liver disease -patient agrees to DNR status at this time, but wishes to continue with all available treatments up until that point. -Updated patient's emergency contact, Santiago Glad regarding his multiple comorbidities and overall prognosis.  She is a former [employer] scientist and appeared to be understanding of the severity of his illness -If patient fails to make meaningful improvements, may need palliative care consult.  DVT prophylaxis: Holding anticoagulation in light of bleeding Code Status: DNR Family Communication: None at bedside.  At patient's request, I contacted his friend Santiago Glad who he has  designated as his Media planner. Disposition Plan:  Status is: Inpatient  Remains inpatient appropriate because:Ongoing diagnostic testing needed not appropriate for outpatient work up and Inpatient level of care appropriate due to severity of illness  Dispo: The patient is from: Home              Anticipated d/c is to: SNF              Patient currently is not medically stable to d/c.  Consultants:  [facility]  Procedures:  ERCP 04/03/2021 Dr. Ardis Hughs:  Impression:       - Large esophageal varices (without signs of recent                            or impending bleeding) and moderate portal                            gastropathy.                           - Relatively small major papilla.                           - Nondilated biliary tree including common bile                            duct, common hepatic duct. The cystic duct  opacified and was also normal. A small mobile                            filling defect noted on cholangiogram correlated                            with MRI suggested bile duct stone. This was                            treated with biliary sphincterotomy and balloon                            sweeping however no stones were delivered and I                            suspect that the filling defect noted today was                            probably a small air bubble. Recommendation: - Return to hospital room. I will allow low salt                            diet.                           - Follow liver tests, I think his overall clinical                            picture is that of decompensation of known liver                            disease.                           - Consider starting non-selective B blocker for his                            large esophageal varices.                           - OK to resume his heparin at 6pm tonight however                            please note that blood thinners in  the setting of                            portal gastropathy and large esophageal varices may                            be risky.  Antimicrobials: Cipro surgical ppx 9/28  Subjective: Patient reports having 5-6 bowel movements today.  Staff reports that last bowel movement was brown in color.  He feels very weak and tired.  Objective: Vitals:   04/06/21 1317 04/06/21 1628 04/06/21 2100 04/07/21 0800  BP: (!) 104/46 (!) 118/58  125/65 (!) 113/59  Pulse: 64 66 64 66  Resp: 16  18 18   Temp: 97.8 F (36.6 C) 98.3 F (36.8 C)  99.2 F (37.3 C)  TempSrc: Oral Oral  Oral  SpO2: 98% 99% 100% 98%  Weight:      Height:        Intake/Output Summary (Last 24 hours) at 04/07/2021 1908 Last data filed at 04/07/2021 1400 Gross per 24 hour  Intake --  Output 500 ml  Net -500 ml   Filed Weights   04/02/21 1200 04/02/21 1311 04/03/21 1246  Weight: 82.1 kg 82.1 kg 82.1 kg   General exam: Alert, awake, oriented x 3 Respiratory system: Clear to auscultation. Respiratory effort normal. Cardiovascular system:RRR. No murmurs, rubs, gallops. Gastrointestinal system: Abdomen is distended, soft and nontender. No organomegaly or masses felt. Normal bowel sounds heard. Central nervous system: Alert and oriented. No focal neurological deficits. Extremities: No C/C/E, +pedal pulses Skin: No rashes, lesions or ulcers Psychiatry: Judgement and insight appear normal. Mood & affect appropriate.     Data Reviewed: I have personally reviewed following labs and imaging studies  CBC: Recent Labs  Lab 04/01/21 2303 04/04/21 0541 04/04/21 0815 04/05/21 0324 04/06/21 0250 04/06/21 1324 04/07/21 0222  WBC 10.6*   < > 9.2 12.8* 8.9 7.9 7.2  NEUTROABS 7.4  --   --   --   --   --   --   HGB 13.2   < > 11.7* 10.1* 9.6* 9.5* 10.1*  HCT 38.4*   < > 33.8* 29.0* 27.4* 27.9* 29.0*  MCV 95.5   < > 92.3 93.2 92.9 93.9 92.7  PLT 211   < > 134* 162 157 148* 143*   < > = values in this interval not displayed.    Basic Metabolic Panel: Recent Labs  Lab 04/03/21 0325 04/04/21 0541 04/05/21 0324 04/06/21 0250 04/07/21 0222  NA 133* 127* 131* 133* 135  K 4.5 4.8 5.0 4.9 4.8  CL 108 101 104 106 107  CO2 19* 16* 18* 19* 20*  GLUCOSE 73 367* 267* 252* 193*  BUN 22 36* 55* 51* 38*  CREATININE 1.37* 1.80* 2.37* 1.63* 1.38*  CALCIUM 8.5* 8.6* 8.9 8.8* 9.1  PHOS  --   --   --  2.6  --    GFR: Estimated Creatinine Clearance: 52.3 mL/min (A) (by C-G formula based on SCr of 1.38 mg/dL (H)). Liver Function Tests: Recent Labs  Lab 04/03/21 0325 04/04/21 0541 04/05/21 0324 04/06/21 0250 04/07/21 0222  AST 145* 135* 121* 139* 173*  ALT 84* 84* 79* 90* 114*  ALKPHOS 204* 193* 174* 184* 176*  BILITOT 10.9* 10.1* 9.2* 8.6* 9.5*  PROT 6.5 6.5 6.6 6.2* 6.2*  ALBUMIN 1.8* 1.9* 2.6* 2.3*  2.3* 2.3*   Recent Labs  Lab 04/01/21 2303  LIPASE 90*   Recent Labs  Lab 04/02/21 0103 04/06/21 1324  AMMONIA 72* 113*   Coagulation Profile: Recent Labs  Lab 04/03/21 1518 04/04/21 0541 04/05/21 0324 04/06/21 0250 04/07/21 0222  INR 1.6* 2.0* 1.9* 1.9* 1.6*   Cardiac Enzymes: No results for input(s): CKTOTAL, CKMB, CKMBINDEX, TROPONINI in the last 168 hours. BNP (last 3 results) No results for input(s): PROBNP in the last 8760 hours. HbA1C: No results for input(s): HGBA1C in the last 72 hours.  CBG: Recent Labs  Lab 04/06/21 1308 04/06/21 1545 04/07/21 0807 04/07/21 1147 04/07/21 1601  GLUCAP 239* 152* 119* 187* 327*   Lipid Profile: No results for input(s): CHOL, HDL,  LDLCALC, TRIG, CHOLHDL, LDLDIRECT in the last 72 hours. Thyroid Function Tests: No results for input(s): TSH, T4TOTAL, FREET4, T3FREE, THYROIDAB in the last 72 hours. Anemia Panel: No results for input(s): VITAMINB12, FOLATE, FERRITIN, TIBC, IRON, RETICCTPCT in the last 72 hours. Urine analysis:    Component Value Date/Time   COLORURINE AMBER (A) 04/02/2021 0546   APPEARANCEUR CLEAR 04/02/2021 0546   LABSPEC  1.036 (H) 04/02/2021 0546   PHURINE 5.0 04/02/2021 0546   GLUCOSEU NEGATIVE 04/02/2021 0546   HGBUR NEGATIVE 04/02/2021 0546   BILIRUBINUR SMALL (A) 04/02/2021 0546   KETONESUR NEGATIVE 04/02/2021 0546   PROTEINUR NEGATIVE 04/02/2021 0546   NITRITE NEGATIVE 04/02/2021 0546   LEUKOCYTESUR NEGATIVE 04/02/2021 0546   Recent Results (from the past 240 hour(s))  Urine Culture     Status: Abnormal   Collection Time: 04/02/21  5:46 AM   Specimen: Urine, Clean Catch  Result Value Ref Range Status   Specimen Description URINE, CLEAN CATCH  Final   Special Requests   Final    NONE Performed at [facility], [place] [address], [place], [place] [address]    Culture MULTIPLE SPECIES PRESENT, SUGGEST RECOLLECTION (A)  Final   Report Status 04/02/2021 FINAL  Final  Resp Panel by RT-PCR (Flu A&B, Covid) Nasopharyngeal Swab     Status: None   Collection Time: 04/02/21  5:46 AM   Specimen: Nasopharyngeal Swab; Nasopharyngeal(NP) swabs in vial transport medium  Result Value Ref Range Status   SARS Coronavirus 2 by RT PCR NEGATIVE NEGATIVE Final    Comment: (NOTE) SARS-CoV-2 target nucleic acids are NOT DETECTED.  The SARS-CoV-2 RNA is generally detectable in upper respiratory specimens during the acute phase of infection. The lowest concentration of SARS-CoV-2 viral copies this assay can detect is 138 copies/mL. A negative result does not preclude SARS-Cov-2 infection and should not be used as the sole basis for treatment or other patient management decisions. A negative result may occur with  improper specimen collection/handling, submission of specimen other than nasopharyngeal swab, presence of viral mutation(s) within the areas targeted by this assay, and inadequate number of viral copies(<138 copies/mL). A negative result must be combined with clinical observations, patient history, and epidemiological information. The expected result is Negative.  Fact Sheet for Patients:   EntrepreneurPulse.com.au  Fact Sheet for Healthcare Providers:  IncredibleEmployment.be  This test is no t yet approved or cleared by the [place] FDA and  has been authorized for detection and/or diagnosis of SARS-CoV-2 by FDA under an Emergency Use Authorization (EUA). This EUA will remain  in effect (meaning this test can be used) for the duration of the COVID-19 declaration under Section 564(b)(1) of the Act, 21 U.S.C.section 360bbb-3(b)(1), unless the authorization is terminated  or revoked sooner.       Influenza A by PCR NEGATIVE NEGATIVE Final   Influenza B by PCR NEGATIVE NEGATIVE Final    Comment: (NOTE) The Xpert Xpress SARS-CoV-2/FLU/RSV plus assay is intended as an aid in the diagnosis of influenza from Nasopharyngeal swab specimens and should not be used as a sole basis for treatment. Nasal washings and aspirates are unacceptable for Xpert Xpress SARS-CoV-2/FLU/RSV testing.  Fact Sheet for Patients: EntrepreneurPulse.com.au  Fact Sheet for Healthcare Providers: IncredibleEmployment.be  This test is not yet approved or cleared by the [place] FDA and has been authorized for detection and/or diagnosis of SARS-CoV-2 by FDA under an Emergency Use Authorization (EUA). This EUA will remain in effect (meaning this test can be used) for  the duration of the COVID-19 declaration under Section 564(b)(1) of the Act, 21 U.S.C. section 360bbb-3(b)(1), unless the authorization is terminated or revoked.  Performed at [facility], [place] [address] [place], [place] [address]   Surgical pcr screen     Status: None   Collection Time: 04/03/21  8:37 AM   Specimen: Nasal Mucosa; Nasal Swab  Result Value Ref Range Status   MRSA, PCR NEGATIVE NEGATIVE Final   Staphylococcus aureus NEGATIVE NEGATIVE Final    Comment: (NOTE) The Xpert SA Assay (FDA approved for NASAL specimens in patients  28 years of age and older), is one component of a comprehensive surveillance program. It is not intended to diagnose infection nor to guide or monitor treatment. Performed at [facility], [place] [address], [place], [place] [address]       Radiology Studies: MR BRAIN WO CONTRAST  Result Date: 04/06/2021 CLINICAL DATA:  Dizziness, persistent/recurrent, cardiac or vascular cause suspected. History of cirrhosis. EXAM: MRI HEAD WITHOUT CONTRAST TECHNIQUE: Multiplanar, multiecho pulse sequences of the brain and surrounding structures were obtained without intravenous contrast. COMPARISON:  None. FINDINGS: Brain: There is no evidence of an acute infarct, intracranial hemorrhage, mass, midline shift, or extra-axial fluid collection. Scattered small T2 hyperintensities in the cerebral white matter bilaterally are nonspecific but compatible with minimal chronic small vessel ischemic disease. There is symmetric intrinsic T1 hyperintensity in the globi pallidi. A subcentimeter chronic infarct is noted in the right cerebellar hemisphere. There is mild generalized cerebral atrophy. Vascular: Major intracranial vascular flow voids are preserved. Skull and upper cervical spine: Unremarkable bone marrow signal. Sinuses/Orbits: Unremarkable orbits. Mild mucosal thickening in the paranasal sinuses. Trace right mastoid fluid. Other: None. IMPRESSION: 1. No acute intracranial abnormality. 2. Minimal chronic small vessel ischemic disease in the cerebral white matter. 3. Tiny chronic right cerebellar infarct. 4. T1 hyperintensity in the globi pallidi consistent with chronic liver disease. Electronically Signed   By: Logan Bores M.D.   On: 04/06/2021 13:35    Scheduled Meds:  carvedilol  3.125 mg Oral BID   cetirizine  5 mg Oral Daily   feeding supplement  237 mL Oral TID BM   insulin aspart  0-20 Units Subcutaneous TID WC   insulin aspart  0-5 Units Subcutaneous QHS   isosorbide mononitrate  90 mg Oral q morning    [START ON 04/08/2021] lactulose  20 g Oral BID   meclizine  25 mg Oral TID   multivitamin with minerals  1 tablet Oral Daily   pantoprazole  40 mg Oral BID   sodium chloride flush  3 mL Intravenous Q12H   Continuous Infusions:     LOS: 5 days   Time spent: 35 minutes.  Kathie Dike, MD [facility] [email or web address] 04/07/2021, 7:08 PM

## 2021-04-08 DIAGNOSIS — I8510 Secondary esophageal varices without bleeding: Secondary | ICD-10-CM

## 2021-04-08 DIAGNOSIS — G9340 Encephalopathy, unspecified: Secondary | ICD-10-CM

## 2021-04-08 LAB — COMPREHENSIVE METABOLIC PANEL
ALT: 122 U/L — ABNORMAL HIGH (ref 0–44)
AST: 172 U/L — ABNORMAL HIGH (ref 15–41)
Albumin: 2.2 g/dL — ABNORMAL LOW (ref 3.5–5.0)
Alkaline Phosphatase: 185 U/L — ABNORMAL HIGH (ref 38–126)
Anion gap: 7 (ref 5–15)
BUN: 30 mg/dL — ABNORMAL HIGH (ref 8–23)
CO2: 21 mmol/L — ABNORMAL LOW (ref 22–32)
Calcium: 8.8 mg/dL — ABNORMAL LOW (ref 8.9–10.3)
Chloride: 105 mmol/L (ref 98–111)
Creatinine, Ser: 1.22 mg/dL (ref 0.61–1.24)
GFR, Estimated: >60 mL/min (ref >60)
Glucose, Bld: 183 mg/dL — ABNORMAL HIGH (ref 70–99)
Potassium: 5.1 mmol/L (ref 3.5–5.1)
Sodium: 133 mmol/L — ABNORMAL LOW (ref 135–145)
Total Bilirubin: 10.4 mg/dL — ABNORMAL HIGH (ref 0.3–1.2)
Total Protein: 6.4 g/dL — ABNORMAL LOW (ref 6.5–8.1)

## 2021-04-08 LAB — CBC
HCT: 31.8 % — ABNORMAL LOW (ref 39.0–52.0)
Hemoglobin: 10.9 g/dL — ABNORMAL LOW (ref 13.0–17.0)
MCH: 32.3 pg (ref 26.0–34.0)
MCHC: 34.3 g/dL (ref 30.0–36.0)
MCV: 94.4 fL (ref 80.0–100.0)
Platelets: 141 10*3/uL — ABNORMAL LOW (ref 150–400)
RBC: 3.37 MIL/uL — ABNORMAL LOW (ref 4.22–5.81)
RDW: 18.6 % — ABNORMAL HIGH (ref 11.5–15.5)
WBC: 8.5 10*3/uL (ref 4.0–10.5)
nRBC: 0 % (ref 0.0–0.2)

## 2021-04-08 LAB — GLUCOSE, CAPILLARY
Glucose-Capillary: 156 mg/dL — ABNORMAL HIGH (ref 70–99)
Glucose-Capillary: 106 mg/dL — ABNORMAL HIGH (ref 70–99)
Glucose-Capillary: 240 mg/dL — ABNORMAL HIGH (ref 70–99)
Glucose-Capillary: 250 mg/dL — ABNORMAL HIGH (ref 70–99)

## 2021-04-08 LAB — AMMONIA: Ammonia: 75 umol/L — ABNORMAL HIGH (ref 9–35)

## 2021-04-08 LAB — PROTIME-INR
INR: 1.5 — ABNORMAL HIGH (ref 0.8–1.2)
Prothrombin Time: 18.5 seconds — ABNORMAL HIGH (ref 11.4–15.2)

## 2021-04-08 MED ORDER — HYDRALAZINE HCL 20 MG/ML IJ SOLN: 10.0000 mg | INTRAMUSCULAR | Status: DC | PRN

## 2021-04-08 MED ORDER — METOPROLOL TARTRATE 5 MG/5ML IV SOLN: 5.0000 mg | INTRAVENOUS | Status: DC | PRN

## 2021-04-08 MED ORDER — HYDROXYZINE HCL 25 MG PO TABS
25.0000 mg | ORAL_TABLET | Freq: Three times a day (TID) | ORAL | Status: DC | PRN
  Administered 2021-04-08 – 2021-04-11 (×4): 25 mg via ORAL
  Filled 2021-04-08 (×4): qty 1

## 2021-04-08 MED ORDER — HYDROXYZINE HCL 25 MG PO TABS: 25.0000 mg | ORAL_TABLET | Freq: Three times a day (TID) | ORAL | 0 refills | Status: DC | PRN

## 2021-04-08 MED ORDER — SENNOSIDES-DOCUSATE SODIUM 8.6-50 MG PO TABS: 1.0000 | ORAL_TABLET | Freq: Every evening | ORAL | Status: DC | PRN

## 2021-04-08 MED ORDER — TRAZODONE HCL 50 MG PO TABS
50.0000 mg | ORAL_TABLET | Freq: Every evening | ORAL | Status: DC | PRN
  Administered 2021-04-09 – 2021-04-11 (×2): 50 mg via ORAL
  Filled 2021-04-08 (×2): qty 1

## 2021-04-08 MED ORDER — LACTULOSE 10 GM/15ML PO SOLN: 20.0000 g | Freq: Two times a day (BID) | ORAL | 0 refills | Status: DC

## 2021-04-08 MED ORDER — MECLIZINE HCL 25 MG PO TABS: 25.0000 mg | ORAL_TABLET | Freq: Three times a day (TID) | ORAL | 0 refills | Status: DC

## 2021-04-08 NOTE — Progress Notes (Addendum)
Daily Rounding Note  04/08/2021, 11:00 AM  LOS: 6 days   SUBJECTIVE:   Chief complaint:  abdominal pain.    Receiving lactulose for elevated ammonia and mental fog.  Patient finding the degree of loose stools and having to ask nursing to clean him up distressing, inconvenient.  Says he was not taking lactulose prior to admission.  RN reports stools which were black are now brown, loose.  No reports of bloody stool.  Abdominal pain is more like a pressure and has improved.  Patient reports 1 paracentesis at the [place] a few months ago.  Also says he had a colonoscopy at the [place] within the last 2 to 3 years (not confirmed).    OBJECTIVE:         Vital signs in last 24 hours:    Temp:  [98.5 F (36.9 C)-98.7 F (37.1 C)] 98.7 F (37.1 C) (10/03 0827) Pulse Rate:  [66-73] 66 (10/03 0827) Resp:  [16-19] 16 (10/03 0827) BP: (135)/(65-69) 135/69 (10/03 0827) SpO2:  [98 %-99 %] 98 % (10/03 0827) Last BM Date: 04/07/21 Filed Weights   04/02/21 1200 04/02/21 1311 04/03/21 1246  Weight: 82.1 kg 82.1 kg 82.1 kg   General: Pleasant, jaundiced, comfortable, alert. Heart: RRR. Chest: No labored breathing or cough.  Lungs clear bilaterally. Abdomen: Distended but soft.  Minor tenderness versus sense of pressure.  Active bowel sounds.  Incision scar consistent with umbilical hernia repair.  Right inguinal hernia is nontender but visible/palpable. Extremities: No CCE. Neuro/Psych: Oriented x3.  Very slight tremor in the hands, possible asterixis but so slight its impossible to say this is asterixis.  Speech is slow but clear, mentation intact.  Intake/Output from previous day: 10/02 0701 - 10/03 0700 In: 477 [P.O.:477] Out: 1525 [Urine:1525]  Intake/Output this shift: No intake/output data recorded.  Lab Results: Recent Labs    04/06/21 1324 04/07/21 0222 04/08/21 0233  WBC 7.9 7.2 8.5  HGB 9.5* 10.1* 10.9*  HCT 27.9* 29.0* 31.8*   PLT 148* 143* 141*   BMET Recent Labs    04/06/21 0250 04/07/21 0222 04/08/21 0233  NA 133* 135 133*  K 4.9 4.8 5.1  CL 106 107 105  CO2 19* 20* 21*  GLUCOSE 252* 193* 183*  BUN 51* 38* 30*  CREATININE 1.63* 1.38* 1.22  CALCIUM 8.8* 9.1 8.8*   LFT Recent Labs    04/06/21 0250 04/07/21 0222 04/08/21 0233  PROT 6.2* 6.2* 6.4*  ALBUMIN 2.3*  2.3* 2.3* 2.2*  AST 139* 173* 172*  ALT 90* 114* 122*  ALKPHOS 184* 176* 185*  BILITOT 8.6* 9.5* 10.4*  BILIDIR 4.7*  --   --   IBILI 3.9*  --   --    PT/INR Recent Labs    04/07/21 0222 04/08/21 0233  LABPROT 19.0* 18.5*  INR 1.6* 1.5*   Hepatitis Panel No results for input(s): HEPBSAG, HCVAB, HEPAIGM, HEPBIGM in the last 72 hours.  Studies/Results: MR BRAIN WO CONTRAST  Result Date: 04/06/2021 CLINICAL DATA:  Dizziness, persistent/recurrent, cardiac or vascular cause suspected. History of cirrhosis. EXAM: MRI HEAD WITHOUT CONTRAST TECHNIQUE: Multiplanar, multiecho pulse sequences of the brain and surrounding structures were obtained without intravenous contrast. COMPARISON:  None. FINDINGS: Brain: There is no evidence of an acute infarct, intracranial hemorrhage, mass, midline shift, or extra-axial fluid collection. Scattered small T2 hyperintensities in the cerebral white matter bilaterally are nonspecific but compatible with minimal chronic small vessel ischemic disease. There is symmetric  intrinsic T1 hyperintensity in the globi pallidi. A subcentimeter chronic infarct is noted in the right cerebellar hemisphere. There is mild generalized cerebral atrophy. Vascular: Major intracranial vascular flow voids are preserved. Skull and upper cervical spine: Unremarkable bone marrow signal. Sinuses/Orbits: Unremarkable orbits. Mild mucosal thickening in the paranasal sinuses. Trace right mastoid fluid. Other: None. IMPRESSION: 1. No acute intracranial abnormality. 2. Minimal chronic small vessel ischemic disease in the cerebral white  matter. 3. Tiny chronic right cerebellar infarct. 4. T1 hyperintensity in the globi pallidi consistent with chronic liver disease. Electronically Signed   By: Logan Bores M.D.   On: 04/06/2021 13:35    Scheduled Meds:  carvedilol  3.125 mg Oral BID   cetirizine  5 mg Oral Daily   feeding supplement  237 mL Oral TID BM   insulin aspart  0-20 Units Subcutaneous TID WC   insulin aspart  0-5 Units Subcutaneous QHS   isosorbide mononitrate  90 mg Oral q morning   lactulose  20 g Oral BID   meclizine  25 mg Oral TID   multivitamin with minerals  1 tablet Oral Daily   pantoprazole  40 mg Oral BID   sodium chloride flush  3 mL Intravenous Q12H   Continuous Infusions: PRN Meds:.acetaminophen **OR** acetaminophen, albuterol, dextrose, diphenhydrAMINE, hydrALAZINE, hydrocortisone-pramoxine, hydrOXYzine, metoprolol tartrate, ondansetron **OR** ondansetron (ZOFRAN) IV, polyethylene glycol, senna-docusate, traZODone, triamcinolone cream   ASSESMENT:      Non ETOH cirrhosis.  Progressive, decompensated with MELD 33 on 9/29.    Abdominal pain.  LFTs  (including T bili, alk phos, transaminases) elevated.  Uncomplicated cholelithiasis per CT scan.  MRCP: Choledocholithiasis, .  Liver lesion per imaging, AFP level 1.9 (normal).  04/03/2021 ERCP/EGD: Large, nonbleeding esophageal varices, moderate portal gastropathy, small major papilla.  Nondilated biliary tree.  Small filling defect on cholangiogram treated with biliary sphincterotomy and balloon sweep but no stones delivered so suspect filling defect may have been air bubble.  Protonix 40 bid in place.  CTAP showing thickened cecum, and ascending colon likely reflecting portal colopathy.  Established diagnosis PVT.  On Eliquis PTA.  Current CT shows portal vein thrombosis but [place] biliary venous collateralization as well as portal venous hypertension with portosystemic collateralization.  Coagulopathy.  Small volume ascites per MRCP    AKI.   Urine  sodium not < 10, though unlikely HRS.  BUN/creatinine improving.  GFR now normal.    Hyponatremia.  Sodium 133, stable.   Elevated ammonia level.  Max 113, currently 75.  Protein malnutrition, of undetermined severity, with low prealbumin of 5.7.  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  No plans for colonoscopy.   PLAN     ? Need for ongoing anti-coagulation/resuming Eliquis?   No plans for EGD or colonoscopy so resumed carb mod/HH diet.      Add non-selective BB?     By recent CT and MR imaging does not seem to have sufficient ascites to perform successful paracentesis.    Does he require bid PPI, or is  1 x daily sufficient?  Taking bid PTA.     Azucena Freed  04/08/2021, 11:00 AM Phone [phone]    I have taken a history, reviewed the chart and examined the patient. I performed a substantive portion of this encounter, including complete performance of at least one of the key components, in conjunction with the APP. 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 Currently doing well but having issues with diarrhea and incontinence secondary to lactulose and continued pruritis  Decompensated cirrhosis, MELDNa 24, not a transplant candidate Hepatic encephalopathy - If incontinence continues to be an issue with lactulose, would start rifaximin 550 mg bid use lactulose PRN  Pruritis - Continue with benadryl or atarax for now, but may need to use alternative medication such as cholestryamin  (4gm bid) if sedation or worsening HE is a  problem  PVT/hx of MVT - Given the patient's history of MVT, continued anticoagulation is recommended.  If GI bleed occurs, then the benefits of anticoagulation should be reassessed  Esophagal varices/PHG - Continue Coreg - Recommend once daily PPI  Questionable GI bleed/FOBT - Patient has portal hypertensive gastropathy which would likely cause a positive FOBT.  Hgb stable.  No need for endoscopic evaluation.  AKI - Appears resolved, continue to avoid NSAIDs  Agree with SNF disposition.  Patient can follow up with his [place] following discharge.   Stacie Knutzen E. Candis Schatz, MD [facility]

## 2021-04-08 NOTE — Progress Notes (Signed)
Physical Therapy Treatment Patient Details Name: Jerry [name] MRN: [id number] DOB: 31-Oct-1950 Today's Date: 04/08/2021   History of Present Illness 70 y.o. male admitted on 04/01/21 for abdominal pain, jaundice, and dizziness.  Pt s/p ERCP on 9/28.  Pt dx with hyperbilirubinemia, non-alcoholic cirrhosis, 63m R liver lesion, coagulopathy s/p 2u FFP, dizziness, acute hepatic encephalopathy, hypoalbuminemia, severe protein calorie malnutrition. Complicated by lower GI bleed.  Pt with significant PMH of DM, HTN, portal vein thrombosis, CAD, HTN, PTSD, paracentesis, L reverse TSA.    PT Comments    Pt lethargic, confused, slow to process.  He was able to get up and walk a short distance around the room, but it took >15 mins to walk 15' with the RW, min to mod assist for mobility.  He remains lethargic, mildly confused.  I turned his lights on and opened his blinds (it was completely dark when I entered).  PT will continue to follow acutely for safe mobility progression.  Pt nauseated at the end of the session, so x1 exercise practice deferred.  Next session review these exercises at the beginning of the session to avoid nausea from mobility.   Recommendations for follow up therapy are one component of a multi-disciplinary discharge planning process, led by the attending physician.  Recommendations may be updated based on patient status, additional functional criteria and insurance authorization.  Follow Up Recommendations  SNF     Equipment Recommendations  Rolling walker with 5" wheels    Recommendations for Other Services       Precautions / Restrictions Precautions Precautions: Fall Precaution Comments: chronic dizziness     Mobility  Bed Mobility Overal bed mobility: Needs Assistance Bed Mobility: Sit to Sidelying         Sit to sidelying: Mod assist General bed mobility comments: Mod assist to help lift both legs back into Bed, HOB elevated and pt relying heavily on railing  for support at trunk during transition.    Transfers Overall transfer level: Needs assistance Equipment used: Rolling walker (2 wheeled) Transfers: Sit to/from Stand Sit to Stand: Min assist         General transfer comment: Min assist to support trunk and stabilize RW for transitions. very very slow transitions over flexed knees with heavy reliance on bil hands on RW to push up to stand.  Ambulation/Gait Ambulation/Gait assistance: Min guard Gait Distance (Feet): 20 Feet Assistive device: Rolling walker (2 wheeled) Gait Pattern/deviations: Step-through pattern;Shuffle;Trunk flexed Gait velocity: decreased Gait velocity interpretation: <1.31 ft/sec, indicative of household ambulator General Gait Details: Pt with extremely slow gait, flexed posture, dizzy when he looks up, practiced targeting and compensatory methods.  Pt took> 15 mins to walk 15'   Stairs             Wheelchair Mobility    Modified Rankin (Stroke Patients Only)       Balance Overall balance assessment: Needs assistance Sitting-balance support: Feet supported;Bilateral upper extremity supported Sitting balance-Leahy Scale: Fair     Standing balance support: Bilateral upper extremity supported Standing balance-Leahy Scale: Poor Standing balance comment: heavily reliant on Bil UE support in standing.                            Cognition Arousal/Alertness: Lethargic Behavior During Therapy: Flat affect Overall Cognitive Status: Impaired/Different from baseline Area of Impairment: Problem solving;Memory  Memory: Decreased short-term memory       Problem Solving: Slow processing;Requires verbal cues General Comments: slow processing, did not recognize me at first, lethargic. Pt reports he is sleeping all day (blinds were closed and all lights were out.  He was standing in front of the [facility] when I entered, RN tech came in later and said she told him to call  (he did not).      Exercises      General Comments General comments (skin integrity, edema, etc.): asked pt if he had done his gaze stability exercises, reports no, but has been using target "A" to get up and focus his gaze to get to [facility].      Pertinent Vitals/Pain Pain Assessment: Faces Faces Pain Scale: Hurts little more Pain Location: back Pain Descriptors / Indicators: Grimacing;Guarding;Aching Pain Intervention(s): Limited activity within patient's tolerance;Monitored during session;Repositioned    Home Living                      Prior Function            PT Goals (current goals can now be found in the care plan section) Acute Rehab PT Goals Patient Stated Goal: to feel better Progress towards PT goals: Progressing toward goals    Frequency    Min 2X/week      PT Plan Current plan remains appropriate;Frequency needs to be updated    Co-evaluation              AM-PAC PT "6 Clicks" Mobility   Outcome Measure  Help needed turning from your back to your side while in a flat bed without using bedrails?: A Little Help needed moving from lying on your back to sitting on the side of a flat bed without using bedrails?: A Little Help needed moving to and from a bed to a chair (including a wheelchair)?: A Little Help needed standing up from a chair using your arms (e.g., wheelchair or bedside chair)?: A Little Help needed to walk in hospital room?: A Little Help needed climbing 3-5 steps with a railing? : A Lot 6 Click Score: 17    End of Session Equipment Utilized During Treatment: Gait belt Activity Tolerance: Patient limited by fatigue Patient left: in bed;with call bell/phone within reach;with bed alarm set;with nursing/sitter in room   PT Visit Diagnosis: Muscle weakness (generalized) (M62.81);Dizziness and giddiness (R42);Difficulty in walking, not elsewhere classified (R26.2)     Time: 6063-0160 PT Time Calculation (min) (ACUTE ONLY): 34  min  Charges:  $Gait Training: 23-37 mins                     Verdene Lennert, PT, DPT  Acute Rehabilitation Ortho Tech Supervisor [phone] pager [phone]) [phone] office

## 2021-04-08 NOTE — TOC Progression Note (Signed)
Transition of Care [facility]) - Progression Note    Patient Details  Name: Jerry [name] MRN: [id number] Date of Birth: [birth year]  Transition of Care [facility]) CM/SW [place], [place] Phone Number: 04/08/2021, 10:08 AM  Clinical Narrative:    CSW spoke with Briscoe Burns- [place] CSW [phone] ext. [phone])[place]. She noted that pt is 100% service connected and has the option to use a [facility] SNF, or SNF's the [place] contracts with. CSW was given the number for Margo [facility] ext [phone]), to follow up with him, as he will be the one to set pt up for SNF. CSW left a voicemail. TOC will continue to follow for SNF placement.        Expected Discharge Plan and Services                                                 Social Determinants of Health (SDOH) Interventions    Readmission Risk Interventions No flowsheet data found.

## 2021-04-08 NOTE — Progress Notes (Signed)
PROGRESS NOTE    Jerry [name]  LKG:[id number] DOB: 10/05/1950 DOA: 04/01/2021 PCP: Deberah Pelton, MD   Brief Narrative:  [age]-year-old with history of nonalcoholic cirrhosis, PVD, ascites, renal cancer, CAD status post PCI, GI bleed admitted for abdominal pain, jaundice and dizziness.  Patient found to have transaminitis with elevated bili.  CT abdomen pelvis showed advanced cirrhosis with interval portal vein thrombosis, splenomegaly, choledocholithiasis, circumferential thickening of cecum and ascending colon reflecting portal colopathy.  GI team was consulted and patient underwent ERCP on 9/28 with performance of sphincterotomy and sweeping but no evidence of stone.  He was also noted to have acute kidney injury and due to concerns of HRS, nephrology team was consulted and was felt to be due to possibly contrast-induced nephropathy.  Eventually hospital course was complicated by GI bleed, started on PPI.   Assessment & Plan:   Principal Problem:   Choledocholithiasis with obstruction Active Problems:   Portal vein thrombosis   Type 2 diabetes mellitus without complication, without long-term current use of insulin (HCC)   Essential hypertension   Liver cirrhosis secondary to NASH (HCC)   Transaminitis   Jaundice   Leukocytosis   GERD (gastroesophageal reflux disease)   Decompensated liver disease (HCC)   Hyponatremia   AKI (acute kidney injury) ([place])   Malnutrition of moderate degree   Transaminitis with elevated bilirubin - Status post ERCP 9/28-performed enterotomy and sweeping without evidence of stone.  LFTs stable.  Continue to monitor.  Decompensated liver cirrhosis, nonalcoholic Chronic portal vein thrombosis; 2017 portal hypertension, ascites, EV, coagulopathy, thrombocytopenia, hypoalbuminemia Acute hepatic encephalopathy - Currently patient is on Coreg.  Continue to hold Lasix and Aldactone upon discharge per nephrology.  Can be resumed outpatient by GI as  necessary -Anticoagulation on hold due to concerns of GI bleed -Lactulose twice daily, titrate 3-4 soft bowel movements daily.  Lower GI bleed - FOBT positive.  Dark stool.  Continue PPI twice daily.  Seen by gastroenterology, no plans for inpatient endoscopy. - GI following.  Acute kidney injury History of renal cancer status post left partial nephrectomy - Suspect contrast-induced nephropathy.  Renal function around baseline 1.3 - Nephrology team following. - Renal ultrasound-no acute findings   27m right liver lesion: Seen on CT (report below):  -Will need outpatient MRI in 3 months for follow-up   Dizziness -Unclear etiology, possibly underlying disease.  Vestibular therapy ongoing, meclizine 25 mg 3 times daily - MRI brain-shows chronic infarct.   Leukocytosis: Resolved  Diabetes mellitus type 2 - A1c 8.8.  Holding metformin and glipizide. -Sliding scale and Accu-Cheks   Essential hypertension -On Coreg, isosorbide mononitrate   Hyperlipidemia -Statin on hold    Moderate protein calorie malnutrition: Prealbumin 5.7. Severe progressive liver disease contributing.  - Dietitian consult     Goals of care -Discussed by previous provider.  Patient wishes to be DNR but would like to have full scope of treatment until then.   DVT prophylaxis: SCDs Code Status: DNR Family Communication:    Status is: Inpatient   Dispo: The patient is from: Home              Anticipated d/c is to: SNF              Patient currently is medically stable for discharge when bed available   Difficult to place patient No       Nutritional status  Nutrition Problem: Moderate Malnutrition Etiology: chronic illness (non-alcoholic cirrhosis)  Signs/Symptoms: moderate fat depletion, moderate muscle  depletion  Interventions: Ensure Enlive (each supplement provides 350kcal and 20 grams of protein), MVI  Body mass index is 25.24 kg/m.           Subjective: Seen and examined  at bedside, no complaints  Review of Systems Otherwise negative except as per HPI, including: General: Denies fever, chills, night sweats or unintended weight loss. Resp: Denies cough, wheezing, shortness of breath. Cardiac: Denies chest pain, palpitations, orthopnea, paroxysmal nocturnal dyspnea. GI: Denies abdominal pain, nausea, vomiting, diarrhea or constipation GU: Denies dysuria, frequency, hesitancy or incontinence MS: Denies muscle aches, joint pain or swelling Neuro: Denies headache, neurologic deficits (focal weakness, numbness, tingling), abnormal gait Psych: Denies anxiety, depression, SI/HI/AVH Skin: Denies new rashes or lesions ID: Denies sick contacts, exotic exposures, travel  Examination:  General exam: Appears calm and comfortable  Respiratory system: Clear to auscultation. Respiratory effort normal. Cardiovascular system: S1 & S2 heard, RRR. No JVD, murmurs, rubs, gallops or clicks. No pedal edema. Gastrointestinal system: Mild abdominal distention, soft and nontender. No organomegaly or masses felt. Normal bowel sounds heard. Central nervous system: Alert and oriented. No focal neurological deficits. Extremities: Symmetric 5 x 5 power. Skin: Jaundice scleral icterus Psychiatry: Judgement and insight appear normal. Mood & affect appropriate.     Objective: Vitals:   04/06/21 2100 04/07/21 0800 04/07/21 2157 04/07/21 2207  BP: 125/65 (!) 113/59 135/65   Pulse: 64 66 73 68  Resp: 18 18 19    Temp:  99.2 F (37.3 C) 98.5 F (36.9 C)   TempSrc:  Oral Oral   SpO2: 100% 98% 99%   Weight:      Height:        Intake/Output Summary (Last 24 hours) at 04/08/2021 0733 Last data filed at 04/08/2021 0406 Gross per 24 hour  Intake 477 ml  Output 1525 ml  Net -1048 ml   Filed Weights   04/02/21 1200 04/02/21 1311 04/03/21 1246  Weight: 82.1 kg 82.1 kg 82.1 kg     Data Reviewed:   CBC: Recent Labs  Lab 04/01/21 2303 04/04/21 0541 04/05/21 0324  04/06/21 0250 04/06/21 1324 04/07/21 0222 04/08/21 0233  WBC 10.6*   < > 12.8* 8.9 7.9 7.2 8.5  NEUTROABS 7.4  --   --   --   --   --   --   HGB 13.2   < > 10.1* 9.6* 9.5* 10.1* 10.9*  HCT 38.4*   < > 29.0* 27.4* 27.9* 29.0* 31.8*  MCV 95.5   < > 93.2 92.9 93.9 92.7 94.4  PLT 211   < > 162 157 148* 143* 141*   < > = values in this interval not displayed.   Basic Metabolic Panel: Recent Labs  Lab 04/04/21 0541 04/05/21 0324 04/06/21 0250 04/07/21 0222 04/08/21 0233  NA 127* 131* 133* 135 133*  K 4.8 5.0 4.9 4.8 5.1  CL 101 104 106 107 105  CO2 16* 18* 19* 20* 21*  GLUCOSE 367* 267* 252* 193* 183*  BUN 36* 55* 51* 38* 30*  CREATININE 1.80* 2.37* 1.63* 1.38* 1.22  CALCIUM 8.6* 8.9 8.8* 9.1 8.8*  PHOS  --   --  2.6  --   --    GFR: Estimated Creatinine Clearance: 59.1 mL/min (by C-G formula based on SCr of 1.22 mg/dL). Liver Function Tests: Recent Labs  Lab 04/04/21 0541 04/05/21 0324 04/06/21 0250 04/07/21 0222 04/08/21 0233  AST 135* 121* 139* 173* 172*  ALT 84* 79* 90* 114* 122*  ALKPHOS 193* 174* 184*  176* 185*  BILITOT 10.1* 9.2* 8.6* 9.5* 10.4*  PROT 6.5 6.6 6.2* 6.2* 6.4*  ALBUMIN 1.9* 2.6* 2.3*  2.3* 2.3* 2.2*   Recent Labs  Lab 04/01/21 2303  LIPASE 90*   Recent Labs  Lab 04/02/21 0103 04/06/21 1324 04/08/21 0233  AMMONIA 72* 113* 75*   Coagulation Profile: Recent Labs  Lab 04/04/21 0541 04/05/21 0324 04/06/21 0250 04/07/21 0222 04/08/21 0233  INR 2.0* 1.9* 1.9* 1.6* 1.5*   Cardiac Enzymes: No results for input(s): CKTOTAL, CKMB, CKMBINDEX, TROPONINI in the last 168 hours. BNP (last 3 results) No results for input(s): PROBNP in the last 8760 hours. HbA1C: No results for input(s): HGBA1C in the last 72 hours. CBG: Recent Labs  Lab 04/07/21 0807 04/07/21 1147 04/07/21 1601 04/07/21 2120 04/08/21 0637  GLUCAP 119* 187* 327* 179* 156*   Lipid Profile: No results for input(s): CHOL, HDL, LDLCALC, TRIG, CHOLHDL, LDLDIRECT in the  last 72 hours. Thyroid Function Tests: No results for input(s): TSH, T4TOTAL, FREET4, T3FREE, THYROIDAB in the last 72 hours. Anemia Panel: No results for input(s): VITAMINB12, FOLATE, FERRITIN, TIBC, IRON, RETICCTPCT in the last 72 hours. Sepsis Labs: No results for input(s): PROCALCITON, LATICACIDVEN in the last 168 hours.  Recent Results (from the past 240 hour(s))  Urine Culture     Status: Abnormal   Collection Time: 04/02/21  5:46 AM   Specimen: Urine, Clean Catch  Result Value Ref Range Status   Specimen Description URINE, CLEAN CATCH  Final   Special Requests   Final    NONE Performed at [facility], [address] [place], [place] [address]    Culture MULTIPLE SPECIES PRESENT, SUGGEST RECOLLECTION (A)  Final   Report Status 04/02/2021 FINAL  Final  Resp Panel by RT-PCR (Flu A&B, Covid) Nasopharyngeal Swab     Status: None   Collection Time: 04/02/21  5:46 AM   Specimen: Nasopharyngeal Swab; Nasopharyngeal(NP) swabs in vial transport medium  Result Value Ref Range Status   SARS Coronavirus 2 by RT PCR NEGATIVE NEGATIVE Final    Comment: (NOTE) SARS-CoV-2 target nucleic acids are NOT DETECTED.  The SARS-CoV-2 RNA is generally detectable in upper respiratory specimens during the acute phase of infection. The lowest concentration of SARS-CoV-2 viral copies this assay can detect is 138 copies/mL. A negative result does not preclude SARS-Cov-2 infection and should not be used as the sole basis for treatment or other patient management decisions. A negative result may occur with  improper specimen collection/handling, submission of specimen other than nasopharyngeal swab, presence of viral mutation(s) within the areas targeted by this assay, and inadequate number of viral copies(<138 copies/mL). A negative result must be combined with clinical observations, patient history, and epidemiological information. The expected result is Negative.  Fact Sheet for Patients:   EntrepreneurPulse.com.au  Fact Sheet for Healthcare Providers:  IncredibleEmployment.be  This test is no t yet approved or cleared by the [place] FDA and  has been authorized for detection and/or diagnosis of SARS-CoV-2 by FDA under an Emergency Use Authorization (EUA). This EUA will remain  in effect (meaning this test can be used) for the duration of the COVID-19 declaration under Section 564(b)(1) of the Act, 21 U.S.C.section 360bbb-3(b)(1), unless the authorization is terminated  or revoked sooner.       Influenza A by PCR NEGATIVE NEGATIVE Final   Influenza B by PCR NEGATIVE NEGATIVE Final    Comment: (NOTE) The Xpert Xpress SARS-CoV-2/FLU/RSV plus assay is intended as an aid in the  diagnosis of influenza from Nasopharyngeal swab specimens and should not be used as a sole basis for treatment. Nasal washings and aspirates are unacceptable for Xpert Xpress SARS-CoV-2/FLU/RSV testing.  Fact Sheet for Patients: EntrepreneurPulse.com.au  Fact Sheet for Healthcare Providers: IncredibleEmployment.be  This test is not yet approved or cleared by the [place] FDA and has been authorized for detection and/or diagnosis of SARS-CoV-2 by FDA under an Emergency Use Authorization (EUA). This EUA will remain in effect (meaning this test can be used) for the duration of the COVID-19 declaration under Section 564(b)(1) of the Act, 21 U.S.C. section 360bbb-3(b)(1), unless the authorization is terminated or revoked.  Performed at [facility], [place] [address] [place], [place] [address]   Surgical pcr screen     Status: None   Collection Time: 04/03/21  8:37 AM   Specimen: Nasal Mucosa; Nasal Swab  Result Value Ref Range Status   MRSA, PCR NEGATIVE NEGATIVE Final   Staphylococcus aureus NEGATIVE NEGATIVE Final    Comment: (NOTE) The Xpert SA Assay (FDA approved for NASAL specimens in patients  23 years of age and older), is one component of a comprehensive surveillance program. It is not intended to diagnose infection nor to guide or monitor treatment. Performed at [facility], [place] [address] [place], [place] [address]          Radiology Studies: MR BRAIN WO CONTRAST  Result Date: 04/06/2021 CLINICAL DATA:  Dizziness, persistent/recurrent, cardiac or vascular cause suspected. History of cirrhosis. EXAM: MRI HEAD WITHOUT CONTRAST TECHNIQUE: Multiplanar, multiecho pulse sequences of the brain and surrounding structures were obtained without intravenous contrast. COMPARISON:  None. FINDINGS: Brain: There is no evidence of an acute infarct, intracranial hemorrhage, mass, midline shift, or extra-axial fluid collection. Scattered small T2 hyperintensities in the cerebral white matter bilaterally are nonspecific but compatible with minimal chronic small vessel ischemic disease. There is symmetric intrinsic T1 hyperintensity in the globi pallidi. A subcentimeter chronic infarct is noted in the right cerebellar hemisphere. There is mild generalized cerebral atrophy. Vascular: Major intracranial vascular flow voids are preserved. Skull and upper cervical spine: Unremarkable bone marrow signal. Sinuses/Orbits: Unremarkable orbits. Mild mucosal thickening in the paranasal sinuses. Trace right mastoid fluid. Other: None. IMPRESSION: 1. No acute intracranial abnormality. 2. Minimal chronic small vessel ischemic disease in the cerebral white matter. 3. Tiny chronic right cerebellar infarct. 4. T1 hyperintensity in the globi pallidi consistent with chronic liver disease. Electronically Signed   By: Logan Bores M.D.   On: 04/06/2021 13:35        Scheduled Meds:  carvedilol  3.125 mg Oral BID   cetirizine  5 mg Oral Daily   feeding supplement  237 mL Oral TID BM   insulin aspart  0-20 Units Subcutaneous TID WC   insulin aspart  0-5 Units Subcutaneous QHS   isosorbide mononitrate  90 mg  Oral q morning   lactulose  20 g Oral BID   meclizine  25 mg Oral TID   multivitamin with minerals  1 tablet Oral Daily   pantoprazole  40 mg Oral BID   sodium chloride flush  3 mL Intravenous Q12H   Continuous Infusions:   LOS: 6 days   Time spent= 35 mins    Chee Dimon Arsenio Loader, MD [facility]  If 7PM-7AM, please contact night-coverage  04/08/2021, 7:33 AM

## 2021-04-08 NOTE — Progress Notes (Signed)
Occupational Therapy Treatment Patient Details Name: Jerry [name] MRN: [id number] DOB: August 08, 1950 Today's Date: 04/08/2021   History of present illness 70 y.o. male admitted on 04/01/21 for abdominal pain, jaundice, and dizziness.  Pt s/p ERCP on 9/28.  Pt dx with hyperbilirubinemia, non-alcoholic cirrhosis, 50m R liver lesion, coagulopathy s/p 2u FFP, dizziness, acute hepatic encephalopathy, hypoalbuminemia, severe protein calorie malnutrition. Complicated by lower GI bleed.  Pt with significant PMH of DM, HTN, portal vein thrombosis, CAD, HTN, PTSD, paracentesis, L reverse TSA.   OT comments  Limited OT session today, pt fatigued and continues to report dizziness if he "moves his head too fast".  Educated on focused gaze for bed mobility, but requires cueing to attend to focused gaze during activity. Min assist for bed mobility, min guard for unsupported sitting at EOB during grooming.  After grooming, pt requested to lay back down and declined further ADLs/mobility at this time.  Continue to recommend SNF. Will follow.    Recommendations for follow up therapy are one component of a multi-disciplinary discharge planning process, led by the attending physician.  Recommendations may be updated based on patient status, additional functional criteria and insurance authorization.    Follow Up Recommendations  SNF;Supervision/Assistance - 24 hour    Equipment Recommendations  3 in 1 bedside commode    Recommendations for Other Services      Precautions / Restrictions Precautions Precautions: Fall Restrictions Weight Bearing Restrictions: No       Mobility Bed Mobility Overal bed mobility: Needs Assistance Bed Mobility: Sidelying to Sit;Sit to Sidelying   Sidelying to sit: Min assist     Sit to sidelying: Min assist General bed mobility comments: pt sidelying upon entry, transitioned using rails with min assist for trunk support.    Transfers                 General  transfer comment: pt declines due to fatigue and dizzinesss    Balance Overall balance assessment: Needs assistance Sitting-balance support: Feet supported Sitting balance-Leahy Scale: Fair Sitting balance - Comments: limited dynamic due to dizziness                                   ADL either performed or assessed with clinical judgement   ADL Overall ADL's : Needs assistance/impaired     Grooming: Min guard;Sitting;Oral care Grooming Details (indicate cue type and reason): unsuppported at EOB                             Functional mobility during ADLs: Minimal assistance General ADL Comments: pt limited by weakness, dizziness and decreased activity tolerance.     Vision       Perception     Praxis      Cognition Arousal/Alertness: Awake/alert Behavior During Therapy: WFL for tasks assessed/performed Overall Cognitive Status: Impaired/Different from baseline Area of Impairment: Problem solving                             Problem Solving: Slow processing;Requires verbal cues          Exercises     Shoulder Instructions       General Comments      Pertinent Vitals/ Pain       Pain Assessment: Faces Faces Pain Scale: Hurts a little bit Pain Location: generalized  Pain Descriptors / Indicators: Grimacing;Guarding;Aching Pain Intervention(s): Limited activity within patient's tolerance;Monitored during session;Repositioned  Home Living                                          Prior Functioning/Environment              Frequency  Min 2X/week        Progress Toward Goals  OT Goals(current goals can now be found in the care plan section)  Progress towards OT goals: Progressing toward goals  Acute Rehab OT Goals Patient Stated Goal: to feel better OT Goal Formulation: With patient  Plan Discharge plan remains appropriate;Frequency remains appropriate    Co-evaluation                  AM-PAC OT "6 Clicks" Daily Activity     Outcome Measure   Help from another person eating meals?: None Help from another person taking care of personal grooming?: A Little Help from another person toileting, which includes using toliet, bedpan, or urinal?: A Lot Help from another person bathing (including washing, rinsing, drying)?: A Lot Help from another person to put on and taking off regular upper body clothing?: A Little Help from another person to put on and taking off regular lower body clothing?: A Lot 6 Click Score: 16    End of Session    OT Visit Diagnosis: Other abnormalities of gait and mobility (R26.89);Muscle weakness (generalized) (M62.81);Pain;Dizziness and giddiness (R42) Pain - part of body:  (generalized)   Activity Tolerance Patient limited by fatigue   Patient Left in bed;with call bell/phone within reach;with bed alarm set   Nurse Communication Mobility status        Time: 4166-0630 OT Time Calculation (min): 17 min  Charges: OT General Charges $OT Visit: 1 Visit OT Treatments $Self Care/Home Management : 8-22 mins  Jolaine Artist, OT [place] Pager [phone] [place] 04/08/2021, 1:18 PM

## 2021-04-09 DIAGNOSIS — L298 Other pruritus: Secondary | ICD-10-CM

## 2021-04-09 DIAGNOSIS — E7220 Disorder of urea cycle metabolism, unspecified: Secondary | ICD-10-CM

## 2021-04-09 LAB — CBC
HCT: 30 % — ABNORMAL LOW (ref 39.0–52.0)
Hemoglobin: 10.3 g/dL — ABNORMAL LOW (ref 13.0–17.0)
MCH: 32.5 pg (ref 26.0–34.0)
MCHC: 34.3 g/dL (ref 30.0–36.0)
MCV: 94.6 fL (ref 80.0–100.0)
Platelets: 148 10*3/uL — ABNORMAL LOW (ref 150–400)
RBC: 3.17 MIL/uL — ABNORMAL LOW (ref 4.22–5.81)
RDW: 19 % — ABNORMAL HIGH (ref 11.5–15.5)
WBC: 8.9 10*3/uL (ref 4.0–10.5)
nRBC: 0 % (ref 0.0–0.2)

## 2021-04-09 LAB — COMPREHENSIVE METABOLIC PANEL
ALT: 114 U/L — ABNORMAL HIGH (ref 0–44)
AST: 155 U/L — ABNORMAL HIGH (ref 15–41)
Albumin: 2 g/dL — ABNORMAL LOW (ref 3.5–5.0)
Alkaline Phosphatase: 192 U/L — ABNORMAL HIGH (ref 38–126)
Anion gap: 8 (ref 5–15)
BUN: 32 mg/dL — ABNORMAL HIGH (ref 8–23)
CO2: 23 mmol/L (ref 22–32)
Calcium: 8.7 mg/dL — ABNORMAL LOW (ref 8.9–10.3)
Chloride: 104 mmol/L (ref 98–111)
Creatinine, Ser: 1.39 mg/dL — ABNORMAL HIGH (ref 0.61–1.24)
GFR, Estimated: 54 mL/min — ABNORMAL LOW (ref >60)
Glucose, Bld: 176 mg/dL — ABNORMAL HIGH (ref 70–99)
Potassium: 5.2 mmol/L — ABNORMAL HIGH (ref 3.5–5.1)
Sodium: 135 mmol/L (ref 135–145)
Total Bilirubin: 11.1 mg/dL — ABNORMAL HIGH (ref 0.3–1.2)
Total Protein: 6.2 g/dL — ABNORMAL LOW (ref 6.5–8.1)

## 2021-04-09 LAB — GLUCOSE, CAPILLARY
Glucose-Capillary: 125 mg/dL — ABNORMAL HIGH (ref 70–99)
Glucose-Capillary: 243 mg/dL — ABNORMAL HIGH (ref 70–99)
Glucose-Capillary: 177 mg/dL — ABNORMAL HIGH (ref 70–99)
Glucose-Capillary: 270 mg/dL — ABNORMAL HIGH (ref 70–99)

## 2021-04-09 LAB — MAGNESIUM: Magnesium: 1.8 mg/dL (ref 1.7–2.4)

## 2021-04-09 MED ORDER — CHOLESTYRAMINE LIGHT 4 G PO PACK
4.0000 g | PACK | Freq: Two times a day (BID) | ORAL | Status: DC
  Administered 2021-04-09 – 2021-04-12 (×7): 4 g via ORAL
  Filled 2021-04-09 (×8): qty 1

## 2021-04-09 MED ORDER — PANTOPRAZOLE SODIUM 40 MG PO TBEC
40.0000 mg | DELAYED_RELEASE_TABLET | Freq: Every day | ORAL | Status: DC
  Administered 2021-04-10 – 2021-04-12 (×3): 40 mg via ORAL
  Filled 2021-04-09 (×3): qty 1

## 2021-04-09 NOTE — TOC Progression Note (Addendum)
Transition of Care [facility]) - Initial/Assessment Note    Patient Details  Name: Jerry [name] MRN: [id number] Date of Birth: February 09, 1951  Transition of [facility]) CM/SW Contact:    Milinda Antis, [place] Phone Number: 04/09/2021, 12:42 PM  Clinical Narrative:                 CSW received a call from [place] with the [place] [phone]) informing CSW that the [place] would not be approving SNF.  CSW notified the patient of the above information.  CSW will now contact the facility in [facility]) to inquire as to whether they will accept the patient under his medicare Part A.  CSW left a VM for Neoma Laming in admissions at [facility] inquiring about the facilities ability to accept the patient.    17:15-  CSW received a returned call from [place] with admissions at [facility].  The is reviewing the referral and have tentatively given a bed offer.  CSW will receive confirmation of bed offer tomorrow morning.  Care team informed of the above information and notified of the need for a COVID test prior to admission to SNF.  Patient Goals and CMS Choice        Expected Discharge Plan and Services           Expected Discharge Date: 04/09/21                                    Prior Living Arrangements/Services                       Activities of Daily Living Home Assistive Devices/Equipment: Dentures (specify type), Hearing aid ADL Screening (condition at time of admission) Patient's cognitive ability adequate to safely complete daily activities?: Yes Is the patient deaf or have difficulty hearing?: Yes Does the patient have difficulty seeing, even when wearing glasses/contacts?: Yes Does the patient have difficulty concentrating, remembering, or making decisions?: No Patient able to express need for assistance with ADLs?: Yes Does the patient have difficulty dressing or bathing?: No Independently performs ADLs?: Yes (appropriate for developmental age) Does the  patient have difficulty walking or climbing stairs?: Yes Weakness of Legs: None Weakness of Arms/Hands: None  Permission Sought/Granted                  Emotional Assessment              Admission diagnosis:  Cholecystitis [K81.9] Hyperammonemia ([place]) [E72.20] Transaminitis [R74.01] Encephalopathy [G93.40] Elevated transaminase level [R74.01] Patient Active Problem List   Diagnosis Date Noted   Malnutrition of moderate degree 04/05/2021   Decompensated liver disease ([place])    Hyponatremia    AKI (acute kidney injury) ([place])    Transaminitis 04/02/2021   Jaundice 04/02/2021   Choledocholithiasis with obstruction 04/02/2021   Leukocytosis 04/02/2021   GERD (gastroesophageal reflux disease) 04/02/2021   Portal hypertension ([place])    Choledocholithiasis    Elevated liver enzymes    Status post reverse arthroplasty of shoulder, 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   Chest pain due to CAD ([place]) 03/27/2017   Upper GI bleed    S/P coronary artery stent placement    Coronary artery disease involving native coronary artery of native heart with angina pectoris ([place])    Chest pain 03/19/2017   Liver cirrhosis secondary to NASH ([place]) 03/19/2017   Portal vein thrombosis 01/21/2016   Type 2 diabetes  mellitus without complication, without long-term current use of insulin ([place]) 01/21/2016   Coronary artery disease due to lipid rich plaque 01/21/2016   Essential hypertension    PCP:  Deberah Pelton, MD Pharmacy:   [id number] - [place], [place] AT [address] [address] Phone: [phone] Fax: [phone]  [place], [place] - [address] [place] [address] Phone: [phone] Fax: [phone]  [id number] [place], [place] - [place] AT [place] [address] Phone: [phone] Fax: [phone]     Social Determinants of Health (SDOH) Interventions    Readmission Risk Interventions No flowsheet data found.

## 2021-04-09 NOTE — Discharge Summary (Signed)
PROGRESS NOTE    Jerry [name]  OMB:[id number] DOB: July 18, 1950 DOA: 04/01/2021 PCP: Deberah Pelton, MD   Brief Narrative:  [age]-year-old with history of nonalcoholic cirrhosis, PVD, ascites, renal cancer, CAD status post PCI, GI bleed admitted for abdominal pain, jaundice and dizziness.  Patient found to have transaminitis with elevated bili.  CT abdomen pelvis showed advanced cirrhosis with interval portal vein thrombosis, splenomegaly, choledocholithiasis, circumferential thickening of cecum and ascending colon reflecting portal colopathy.  GI team was consulted and patient underwent ERCP on 9/28 with performance of sphincterotomy and sweeping but no evidence of stone.  He was also noted to have acute kidney injury and due to concerns of HRS, nephrology team was consulted and was felt to be due to possibly contrast-induced nephropathy.  Eventually hospital course was complicated by GI bleed, started on PPI.  Patient is cleared by nephrology and GI for discharge, awaiting SNF placement.   Assessment & Plan:   Principal Problem:   Choledocholithiasis with obstruction Active Problems:   Portal vein thrombosis   Type 2 diabetes mellitus without complication, without long-term current use of insulin (HCC)   Essential hypertension   Liver cirrhosis secondary to NASH (HCC)   Transaminitis   Jaundice   Leukocytosis   GERD (gastroesophageal reflux disease)   Decompensated liver disease (HCC)   Hyponatremia   AKI (acute kidney injury) ([place])   Malnutrition of moderate degree   Transaminitis with elevated bilirubin - Status post ERCP 9/28-performed enterotomy and sweeping without evidence of stone.  LFTs stable, continue to monitor  Decompensated liver cirrhosis, nonalcoholic Chronic portal vein thrombosis; 2017 portal hypertension, ascites, EV, coagulopathy, thrombocytopenia, hypoalbuminemia Acute hepatic encephalopathy - Currently patient is on Coreg.  Continue to hold Lasix and  Aldactone upon discharge per nephrology.  Can be resumed outpatient by GI as necessary -Anticoagulation on hold due to concerns of GI bleed - Lactulose twice daily, need to have at least 3-4 soft bowel movements daily  Lower GI bleed - FOBT positive.  Dark stool.  Continue PPI twice daily.  Seen by gastroenterology, no plans for inpatient endoscopy. - GI following.  Acute kidney injury History of renal cancer status post left partial nephrectomy - Suspect contrast-induced nephropathy.  Renal function around baseline 1.3 - Cleared by nephrology for discharge - Renal ultrasound-no acute findings   58m right liver lesion: Seen on CT (report below):  -Will need outpatient MRI in 3 months for follow-up   Dizziness -Unclear etiology, possibly underlying disease.  Vestibular therapy ongoing, meclizine 25 mg 3 times daily - MRI brain-shows chronic infarct.   Leukocytosis: Resolved  Diabetes mellitus type 2, - A1c 8.8.  Holding metformin and glipizide. - Continue sliding scale and Accu-Cheks   Essential hypertension -On Coreg, isosorbide mononitrate   Hyperlipidemia -Statin on hold    Moderate protein calorie malnutrition: Prealbumin 5.7. Severe progressive liver disease contributing.  - Dietitian consult     Goals of care -Discussed by previous provider.  Patient wishes to be DNR but would like to have full scope of treatment until then.   DVT prophylaxis: SCDs Code Status: DNR Family Communication:    Status is: Inpatient   Dispo: The patient is from: Home              Anticipated d/c is to: SNF              Patient currently is medically stable for discharge when bed is available   Difficult to place patient No  Nutritional status  Nutrition Problem: Moderate Malnutrition Etiology: chronic illness (non-alcoholic cirrhosis)  Signs/Symptoms: moderate fat depletion, moderate muscle depletion  Interventions: Ensure Enlive (each supplement provides 350kcal  and 20 grams of protein), MVI  Body mass index is 25.24 kg/m.           Subjective: Seen and examined at bedside, no complaints this morning.  Doing okay.  Only had 1-2 bowel movement yesterday according to the patient   Examination:  Constitutional: Not in acute distress Respiratory: Clear to auscultation bilaterally Cardiovascular: Normal sinus rhythm, no rubs Abdomen: Nontender nondistended good bowel sounds Musculoskeletal: No edema noted Skin: Jaundice, scleral icterus Neurologic: CN 2-12 grossly intact.  And nonfocal Psychiatric: Normal judgment and insight. Alert and oriented x 3. Normal mood.   Objective: Vitals:   04/08/21 0827 04/08/21 1451 04/08/21 2113 04/09/21 0807  BP: 135/69 115/60 114/62 134/60  Pulse: 66 61 63 62  Resp: 16 15 17 18   Temp: 98.7 F (37.1 C) 98.3 F (36.8 C) 98.4 F (36.9 C) 98.5 F (36.9 C)  TempSrc: Oral Oral Oral Oral  SpO2: 98% 97% 99% 98%  Weight:      Height:        Intake/Output Summary (Last 24 hours) at 04/09/2021 1153 Last data filed at 04/09/2021 0932 Gross per 24 hour  Intake 363 ml  Output 300 ml  Net 63 ml   Filed Weights   04/02/21 1200 04/02/21 1311 04/03/21 1246  Weight: 82.1 kg 82.1 kg 82.1 kg     Data Reviewed:   CBC: Recent Labs  Lab 04/06/21 0250 04/06/21 1324 04/07/21 0222 04/08/21 0233 04/09/21 0123  WBC 8.9 7.9 7.2 8.5 8.9  HGB 9.6* 9.5* 10.1* 10.9* 10.3*  HCT 27.4* 27.9* 29.0* 31.8* 30.0*  MCV 92.9 93.9 92.7 94.4 94.6  PLT 157 148* 143* 141* 412*   Basic Metabolic Panel: Recent Labs  Lab 04/05/21 0324 04/06/21 0250 04/07/21 0222 04/08/21 0233 04/09/21 0123  NA 131* 133* 135 133* 135  K 5.0 4.9 4.8 5.1 5.2*  CL 104 106 107 105 104  CO2 18* 19* 20* 21* 23  GLUCOSE 267* 252* 193* 183* 176*  BUN 55* 51* 38* 30* 32*  CREATININE 2.37* 1.63* 1.38* 1.22 1.39*  CALCIUM 8.9 8.8* 9.1 8.8* 8.7*  MG  --   --   --   --  1.8  PHOS  --  2.6  --   --   --    GFR: Estimated Creatinine  Clearance: 51.9 mL/min (A) (by C-G formula based on SCr of 1.39 mg/dL (H)). Liver Function Tests: Recent Labs  Lab 04/05/21 0324 04/06/21 0250 04/07/21 0222 04/08/21 0233 04/09/21 0123  AST 121* 139* 173* 172* 155*  ALT 79* 90* 114* 122* 114*  ALKPHOS 174* 184* 176* 185* 192*  BILITOT 9.2* 8.6* 9.5* 10.4* 11.1*  PROT 6.6 6.2* 6.2* 6.4* 6.2*  ALBUMIN 2.6* 2.3*  2.3* 2.3* 2.2* 2.0*   No results for input(s): LIPASE, AMYLASE in the last 168 hours.  Recent Labs  Lab 04/06/21 1324 04/08/21 0233  AMMONIA 113* 75*   Coagulation Profile: Recent Labs  Lab 04/04/21 0541 04/05/21 0324 04/06/21 0250 04/07/21 0222 04/08/21 0233  INR 2.0* 1.9* 1.9* 1.6* 1.5*   Cardiac Enzymes: No results for input(s): CKTOTAL, CKMB, CKMBINDEX, TROPONINI in the last 168 hours. BNP (last 3 results) No results for input(s): PROBNP in the last 8760 hours. HbA1C: No results for input(s): HGBA1C in the last 72 hours. CBG: Recent Labs  Lab 04/08/21  7591 04/08/21 1121 04/08/21 1539 04/08/21 2018 04/09/21 0701  GLUCAP 156* 106* 240* 250* 125*   Lipid Profile: No results for input(s): CHOL, HDL, LDLCALC, TRIG, CHOLHDL, LDLDIRECT in the last 72 hours. Thyroid Function Tests: No results for input(s): TSH, T4TOTAL, FREET4, T3FREE, THYROIDAB in the last 72 hours. Anemia Panel: No results for input(s): VITAMINB12, FOLATE, FERRITIN, TIBC, IRON, RETICCTPCT in the last 72 hours. Sepsis Labs: No results for input(s): PROCALCITON, LATICACIDVEN in the last 168 hours.  Recent Results (from the past 240 hour(s))  Urine Culture     Status: Abnormal   Collection Time: 04/02/21  5:46 AM   Specimen: Urine, Clean Catch  Result Value Ref Range Status   Specimen Description URINE, CLEAN CATCH  Final   Special Requests   Final    NONE Performed at [facility], [address], [place], [place] [address]    Culture MULTIPLE SPECIES PRESENT, SUGGEST RECOLLECTION (A)  Final   Report Status 04/02/2021  FINAL  Final  Resp Panel by RT-PCR (Flu A&B, Covid) Nasopharyngeal Swab     Status: None   Collection Time: 04/02/21  5:46 AM   Specimen: Nasopharyngeal Swab; Nasopharyngeal(NP) swabs in vial transport medium  Result Value Ref Range Status   SARS Coronavirus 2 by RT PCR NEGATIVE NEGATIVE Final    Comment: (NOTE) SARS-CoV-2 target nucleic acids are NOT DETECTED.  The SARS-CoV-2 RNA is generally detectable in upper respiratory specimens during the acute phase of infection. The lowest concentration of SARS-CoV-2 viral copies this assay can detect is 138 copies/mL. A negative result does not preclude SARS-Cov-2 infection and should not be used as the sole basis for treatment or other patient management decisions. A negative result may occur with  improper specimen collection/handling, submission of specimen other than nasopharyngeal swab, presence of viral mutation(s) within the areas targeted by this assay, and inadequate number of viral copies(<138 copies/mL). A negative result must be combined with clinical observations, patient history, and epidemiological information. The expected result is Negative.  Fact Sheet for Patients:  EntrepreneurPulse.com.au  Fact Sheet for Healthcare Providers:  IncredibleEmployment.be  This test is no t yet approved or cleared by the [place] FDA and  has been authorized for detection and/or diagnosis of SARS-CoV-2 by FDA under an Emergency Use Authorization (EUA). This EUA will remain  in effect (meaning this test can be used) for the duration of the COVID-19 declaration under Section 564(b)(1) of the Act, 21 U.S.C.section 360bbb-3(b)(1), unless the authorization is terminated  or revoked sooner.       Influenza A by PCR NEGATIVE NEGATIVE Final   Influenza B by PCR NEGATIVE NEGATIVE Final    Comment: (NOTE) The Xpert Xpress SARS-CoV-2/FLU/RSV plus assay is intended as an aid in the diagnosis of influenza  from Nasopharyngeal swab specimens and should not be used as a sole basis for treatment. Nasal washings and aspirates are unacceptable for Xpert Xpress SARS-CoV-2/FLU/RSV testing.  Fact Sheet for Patients: EntrepreneurPulse.com.au  Fact Sheet for Healthcare Providers: IncredibleEmployment.be  This test is not yet approved or cleared by the [place] FDA and has been authorized for detection and/or diagnosis of SARS-CoV-2 by FDA under an Emergency Use Authorization (EUA). This EUA will remain in effect (meaning this test can be used) for the duration of the COVID-19 declaration under Section 564(b)(1) of the Act, 21 U.S.C. section 360bbb-3(b)(1), unless the authorization is terminated or revoked.  Performed at [facility], [place] [address], [place], [place] [address]  Surgical pcr screen     Status: None   Collection Time: 04/03/21  8:37 AM   Specimen: Nasal Mucosa; Nasal Swab  Result Value Ref Range Status   MRSA, PCR NEGATIVE NEGATIVE Final   Staphylococcus aureus NEGATIVE NEGATIVE Final    Comment: (NOTE) The Xpert SA Assay (FDA approved for NASAL specimens in patients 3 years of age and older), is one component of a comprehensive surveillance program. It is not intended to diagnose infection nor to guide or monitor treatment. Performed at [facility], [place] [address], [place], [place] [address]          Radiology Studies: No results found.      Scheduled Meds:  carvedilol  3.125 mg Oral BID   cetirizine  5 mg Oral Daily   feeding supplement  237 mL Oral TID BM   insulin aspart  0-20 Units Subcutaneous TID WC   insulin aspart  0-5 Units Subcutaneous QHS   isosorbide mononitrate  90 mg Oral q morning   lactulose  20 g Oral BID   meclizine  25 mg Oral TID   multivitamin with minerals  1 tablet Oral Daily   pantoprazole  40 mg Oral BID   sodium chloride flush  3 mL Intravenous Q12H   Continuous  Infusions:   LOS: 7 days   Time spent= 35 mins    Jerry Swoyer Arsenio Loader, MD [facility]  If 7PM-7AM, please contact night-coverage  04/09/2021, 11:53 AM

## 2021-04-09 NOTE — Progress Notes (Signed)
Nutrition Follow-up  DOCUMENTATION CODES:   Non-severe (moderate) malnutrition in context of chronic illness  INTERVENTION:   -Continue Ensure Enlive po TID, each supplement provides 350 kcal and 20 grams of protein  -Continue MVI with minerals daily -Continue feeding assistance with meals -Continua lactaid milk at all meals  NUTRITION DIAGNOSIS:   Moderate Malnutrition related to chronic illness (non-alcoholic cirrhosis) as evidenced by moderate fat depletion, moderate muscle depletion.  Ongoing  GOAL:   Patient will meet greater than or equal to 90% of their needs  Progressing   MONITOR:   PO intake, Supplement acceptance, Labs, Weight trends, Skin, I & O's  REASON FOR ASSESSMENT:   Consult Assessment of nutrition requirement/status  ASSESSMENT:   Jerry [name] is a 70 y.o. 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  9/28- s/p ERCP- revealed large esophageal varicies, relatively small major papilla, nondilated biliary tree s/p biliary sphincterotomy  Reviewed I/O's: -340 ml x 24 hours and -2.4 L since admission  UOP: 700 ml x 24 hours  Pt sleeping soundly at time of visit. RD did not disturb.   Intake has improved since last visit. Noted meal completion 50-100%. Pt consuming 1-2 Ensure supplements daily.   Pt medically stable for discharge; awaiting SNF placement.   Medications reviewed and include lactulose.  Labs reviewed: K: 5.2, CBGS: 125-250 (inpatient orders for glycemic control are 0-20 units insulin aspart TID with meals and 0-5 units insulin aspart daily at bedtime).    Diet  Order:   Diet Order             Diet heart healthy/carb modified Room service appropriate? Yes; Fluid consistency: Thin  Diet effective now                   EDUCATION NEEDS:   Education needs have been addressed  Skin:  Skin Assessment: Reviewed RN Assessment  Last BM:  04/09/21  Height:   Ht Readings from Last 1 Encounters:  04/03/21 5' 11"  (1.803 m)    Weight:   Wt Readings from Last 1 Encounters:  04/03/21 82.1 kg    Ideal Body Weight:  78.2 kg  BMI:  Body mass index is 25.24 kg/m.  Estimated Nutritional Needs:   Kcal:  0981-1914  Protein:  120-135 grams  Fluid:  > 2 L    Loistine Chance, RD, LDN, [place] Registered Dietitian II Certified Diabetes Care and Education Specialist Please refer to [facility] for RD and/or RD on-call/weekend/after hours pager

## 2021-04-09 NOTE — Progress Notes (Addendum)
Daily Rounding Note  04/09/2021, 1:12 PM  LOS: 7 days   SUBJECTIVE:   Chief complaint:  decompensated cirrhosis.     Eating about half of what he is served, appetite remains depressed.  No nausea or vomiting.  Minor abdominal pressure in the lower abdomen.  No nausea.  Says he did not have any bowel movements yesterday but had to brown, soft to liquid stools today.  Feels like his eyeballs are going to pop out of his head.  Still bothered by pruritus in limbs and trunk.  OBJECTIVE:         Vital signs in last 24 hours:    Temp:  [98.3 F (36.8 C)-98.5 F (36.9 C)] 98.5 F (36.9 C) (10/04 0807) Pulse Rate:  [61-63] 62 (10/04 0807) Resp:  [15-18] 18 (10/04 0807) BP: (114-134)/(60-62) 134/60 (10/04 0807) SpO2:  [97 %-99 %] 98 % (10/04 0807) Last BM Date: 04/09/21 Filed Weights   04/02/21 1200 04/02/21 1311 04/03/21 1246  Weight: 82.1 kg 82.1 kg 82.1 kg   General: looks unwell, frail but comfortable and alert   Heart: RRR Chest: no dyspnea Abdomen: Soft.  Minimal lower abdominal discomfort.  Active bowel sounds.  No distention. Extremities: No CCE. Neuro/Psych: Alert.  No tremor or asterixis.  Moves all 4 limbs, strength not tested.  Speech slow but clear and accurate.  Intake/Output from previous day: 10/03 0701 - 10/04 0700 In: 360 [P.O.:360] Out: 700 [Urine:700]  Intake/Output this shift: Total I/O In: 3 [I.V.:3] Out: -   Lab Results: Recent Labs    04/07/21 0222 04/08/21 0233 04/09/21 0123  WBC 7.2 8.5 8.9  HGB 10.1* 10.9* 10.3*  HCT 29.0* 31.8* 30.0*  PLT 143* 141* 148*   BMET Recent Labs    04/07/21 0222 04/08/21 0233 04/09/21 0123  NA 135 133* 135  K 4.8 5.1 5.2*  CL 107 105 104  CO2 20* 21* 23  GLUCOSE 193* 183* 176*  BUN 38* 30* 32*  CREATININE 1.38* 1.22 1.39*  CALCIUM 9.1 8.8* 8.7*   LFT Recent Labs    04/07/21 0222 04/08/21 0233 04/09/21 0123  PROT 6.2* 6.4* 6.2*  ALBUMIN  2.3* 2.2* 2.0*  AST 173* 172* 155*  ALT 114* 122* 114*  ALKPHOS 176* 185* 192*  BILITOT 9.5* 10.4* 11.1*   PT/INR Recent Labs    04/07/21 0222 04/08/21 0233  LABPROT 19.0* 18.5*  INR 1.6* 1.5*   Hepatitis Panel No results for input(s): HEPBSAG, HCVAB, HEPAIGM, HEPBIGM in the last 72 hours.  Studies/Results: No results found.  Scheduled Meds:  carvedilol  3.125 mg Oral BID   cetirizine  5 mg Oral Daily   feeding supplement  237 mL Oral TID BM   insulin aspart  0-20 Units Subcutaneous TID WC   insulin aspart  0-5 Units Subcutaneous QHS   isosorbide mononitrate  90 mg Oral q morning   lactulose  20 g Oral BID   meclizine  25 mg Oral TID   multivitamin with minerals  1 tablet Oral Daily   pantoprazole  40 mg Oral BID   sodium chloride flush  3 mL Intravenous Q12H   Continuous Infusions: PRN Meds:.acetaminophen **OR** acetaminophen, albuterol, dextrose, diphenhydrAMINE, hydrALAZINE, hydrocortisone-pramoxine, hydrOXYzine, metoprolol tartrate, ondansetron **OR** ondansetron (ZOFRAN) IV, polyethylene glycol, senna-docusate, traZODone, triamcinolone cream  ASSESMENT:       Abdominal pain.   Decompensated cirrhosis.  Elevated LFTs  (including T bili, alk phos, transaminases).  Uncomplicated cholelithiasis per CT  scan.  MRCP: Choledocholithiasis, .  Liver lesion per imaging, AFP level 1.9 (normal).  04/03/2021 ERCP/EGD: Large, nonbleeding esophageal varices, moderate portal gastropathy, small major papilla.  Nondilated biliary tree.  Small filling defect on cholangiogram treated with biliary sphincterotomy and balloon sweep but no stones delivered so suspect filling defect may have been air bubble.  Protonix 40 bid in place.  CTAP showing thickened cecum, and ascending colon likely reflecting portal colopathy.  On Carvedilol, PPI, prn hydroxyzine     Established diagnosis PVT/MVT.  On Eliquis PTA, on hold now.  Current CT shows portal vein thrombosis w peri-biliary venous  collateralization as well as portal venous hypertension with portosystemic collateralization.    Pruritus.  Persists.  Prn hydroxyzine and Benadryl not that helpful   Coagulopathy.  INR 1.9 >> 1.5.    Hyperkalemia.     AKI.  Normocytic anemia.  Hgb slightly improved at 10.3.   Small volume ascites per MRCP    HE, Elevated ammonia level.  Max 113, currently 75. Lactulos bid in place.     Protein malnutrition, of undetermined severity, with low prealbumin of 5.7.   Portal colopathy suggested by CT, patient says he has had a colon[facility] within the last 3 years, no data to confirm this.  No plans for colonoscopy. Prn Proctofoam in place.     PLAN   Reluctant to start Questran for pruritus given that we are trying to promote bowel movements with lactulose to address encephalopathy.  GI will follow patient from periphery.  Call back if active reinvolvement needed.    Azucena Freed  04/09/2021, 1:12 PM Phone [phone]   I have taken a history, reviewed the chart and examined the patient. I performed a substantive portion of this encounter, including complete performance of at least one of the key components, in conjunction with the APP. I agree with the APP's note, impression and recommendations    70 year old male with decompensated cirrhosis complicated by PVT/MVT, ascites, nonbleeding gastroesophageal varices and hepatic encephalopathy, admitted with abdominal pain and concern for choledocholithiasis, ERCP negative for stone, experienced acute kidney injury thought to be secondary to contrast nephropathy which appears to have resolved. Currently doing well but having issues with diarrhea and incontinence secondary to lactulose and continued pruritis   Decompensated cirrhosis, MELDNa 24, not a transplant candidate Hepatic encephalopathy - If incontinence continues to be an issue with lactulose, would start rifaximin 550 mg bid use lactulose PRN   Pruritis - This is the  patient's primary complaint to me.  I think a trial of cholestyramine is reasonable, but will possibly need to increase dose of lactulose while on cholestyramine. - Will start cholestyramine 4gm bid, should be given 1 hr after other oral medications   PVT/hx of MVT - Given the patient's history of MVT, continued anticoagulation is recommended.  If GI bleed occurs, then the benefits of anticoagulation should be reassessed   Esophagal varices/PHG - Continue Coreg - Recommend once daily PPI   Questionable GI bleed/FOBT - Patient has portal hypertensive gastropathy which would likely cause a positive FOBT.  Hgb stable.  No need for endoscopic evaluation.  Hepatic lesion - Will need repeat MRI in 3-6 months.   AKI - Appears resolved, continue to avoid NSAIDs   Agree with SNF disposition.  Patient can follow up with his [place] following discharge.     Lucciano Vitali E. Candis Schatz, MD [facility]

## 2021-04-10 LAB — CBC
HCT: 31.3 % — ABNORMAL LOW (ref 39.0–52.0)
Hemoglobin: 10.7 g/dL — ABNORMAL LOW (ref 13.0–17.0)
MCH: 32.6 pg (ref 26.0–34.0)
MCHC: 34.2 g/dL (ref 30.0–36.0)
MCV: 95.4 fL (ref 80.0–100.0)
Platelets: 164 10*3/uL (ref 150–400)
RBC: 3.28 MIL/uL — ABNORMAL LOW (ref 4.22–5.81)
RDW: 19.2 % — ABNORMAL HIGH (ref 11.5–15.5)
WBC: 9.6 10*3/uL (ref 4.0–10.5)
nRBC: 0 % (ref 0.0–0.2)

## 2021-04-10 LAB — COMPREHENSIVE METABOLIC PANEL
ALT: 115 U/L — ABNORMAL HIGH (ref 0–44)
AST: 156 U/L — ABNORMAL HIGH (ref 15–41)
Albumin: 2 g/dL — ABNORMAL LOW (ref 3.5–5.0)
Alkaline Phosphatase: 193 U/L — ABNORMAL HIGH (ref 38–126)
Anion gap: 7 (ref 5–15)
BUN: 34 mg/dL — ABNORMAL HIGH (ref 8–23)
CO2: 21 mmol/L — ABNORMAL LOW (ref 22–32)
Calcium: 8.5 mg/dL — ABNORMAL LOW (ref 8.9–10.3)
Chloride: 102 mmol/L (ref 98–111)
Creatinine, Ser: 1.37 mg/dL — ABNORMAL HIGH (ref 0.61–1.24)
GFR, Estimated: 55 mL/min — ABNORMAL LOW (ref >60)
Glucose, Bld: 218 mg/dL — ABNORMAL HIGH (ref 70–99)
Potassium: 5.1 mmol/L (ref 3.5–5.1)
Sodium: 130 mmol/L — ABNORMAL LOW (ref 135–145)
Total Bilirubin: 12.3 mg/dL — ABNORMAL HIGH (ref 0.3–1.2)
Total Protein: 6.3 g/dL — ABNORMAL LOW (ref 6.5–8.1)

## 2021-04-10 LAB — GLUCOSE, CAPILLARY
Glucose-Capillary: 141 mg/dL — ABNORMAL HIGH (ref 70–99)
Glucose-Capillary: 191 mg/dL — ABNORMAL HIGH (ref 70–99)
Glucose-Capillary: 216 mg/dL — ABNORMAL HIGH (ref 70–99)
Glucose-Capillary: 238 mg/dL — ABNORMAL HIGH (ref 70–99)

## 2021-04-10 LAB — RESP PANEL BY RT-PCR (FLU A&B, COVID) ARPGX2
Influenza A by PCR: NEGATIVE
Influenza B by PCR: NEGATIVE
SARS Coronavirus 2 by RT PCR: NEGATIVE

## 2021-04-10 LAB — MAGNESIUM: Magnesium: 2 mg/dL (ref 1.7–2.4)

## 2021-04-10 MED ORDER — PANTOPRAZOLE SODIUM 40 MG PO TBEC: 40.0000 mg | DELAYED_RELEASE_TABLET | Freq: Two times a day (BID) | ORAL | 0 refills | Status: DC

## 2021-04-10 MED ORDER — CHOLESTYRAMINE LIGHT 4 G PO PACK: 4.0000 g | PACK | Freq: Two times a day (BID) | ORAL | Status: DC

## 2021-04-10 MED ORDER — LACTULOSE 10 GM/15ML PO SOLN: 20.0000 g | Freq: Three times a day (TID) | ORAL | 0 refills | Status: DC

## 2021-04-10 NOTE — Progress Notes (Signed)
This chaplain is present with Pt., Pt. significant other-Lee Ann, notary, and two witnesses for the notarizing of the Pt. Advance Directive:  HCPOA and Living Will.  The Pt. named Jerry [name] as his healthcare agent.   The chaplain gave the original AD and one copy to the Pt.  The chaplain scanned a copy of the Pt. AD into the EMR.  This chaplain is available for F/U spiritual care as needed.  Chaplain Sallyanne Kuster [phone]

## 2021-04-10 NOTE — Discharge Summary (Addendum)
Physician Discharge Summary  Jerry [name] TTS:[id number] DOB: 1951-03-05 DOA: 04/01/2021  PCP: Deberah Pelton, MD  Admit date: 04/01/2021 Discharge date: 04/10/2021  Admitted From: Home Disposition: SNF  Recommendations for Outpatient Follow-up:  Follow up with PCP in 1-2 weeks Please obtain CMP/CBC in one week your next doctors visit.  Discontinue Lasix and Aldactone, once renal function stabilized this can be resumed outpatient if necessary Colestyramine initiated for severe pruritus which is helped.  This should be given 1 hour after other medications Increase lactulose 3 times daily Meclizine as needed for dizziness Statin has been stopped for now due to elevated LFTs, consider resuming this once LFTs have stabilized Eliquis stopped for concerns of GI bleed. PPI BID Patient will need repeat MRI in 3-6 months for hepatic lesion.  Discharge Condition: Stable CODE STATUS: DNR Diet recommendation: 2 g salt  Brief/Interim Summary: [age]-year-old with history of nonalcoholic cirrhosis, PVD, ascites, renal cancer, CAD status post PCI, GI bleed admitted for abdominal pain, jaundice and dizziness.  Patient found to have transaminitis with elevated bili.  CT abdomen pelvis showed advanced cirrhosis with interval portal vein thrombosis, splenomegaly, choledocholithiasis, circumferential thickening of cecum and ascending colon reflecting portal colopathy.  GI team was consulted and patient underwent ERCP on 9/28 with performance of sphincterotomy and sweeping but no evidence of stone.  He was also noted to have acute kidney injury and due to concerns of HRS, nephrology team was consulted and was felt to be due to possibly contrast-induced nephropathy.  Eventually hospital course was complicated by GI bleed, started on PPI.  Patient is cleared by nephrology and GI for discharge, awaiting SNF placement.     Assessment & Plan:   Principal Problem:   Choledocholithiasis with obstruction Active  Problems:   Portal vein thrombosis   Type 2 diabetes mellitus without complication, without long-term current use of insulin (HCC)   Essential hypertension   Liver cirrhosis secondary to NASH (HCC)   Transaminitis   Jaundice   Leukocytosis   GERD (gastroesophageal reflux disease)   Decompensated liver disease (HCC)   Hyponatremia   AKI (acute kidney injury) ([place])   Malnutrition of moderate degree     Transaminitis with elevated bilirubin - Status post ERCP 9/28-performed enterotomy and sweeping without evidence of stone.  LFTs stable, continue to monitor.  Currently statin is on hold which can be resumed in the future.   Decompensated liver cirrhosis, nonalcoholic Chronic portal vein thrombosis; 2017 portal hypertension, ascites, EV, coagulopathy, thrombocytopenia, hypoalbuminemia Acute hepatic encephalopathy - Currently patient is on Coreg.  Continue to hold Lasix and Aldactone upon discharge per nephrology.  Can be resumed outpatient by GI as necessary -Anticoagulation on hold due to concerns of GI bleed - Lactulose increased to 3 times daily.  Needs to have at least 3-4 soft bowel movements.  Cholestyramine has been added to help with his pruritus and should be given 1 hour after other medications.   Lower GI bleed - FOBT positive.  Dark stool.  Continue PPI twice daily.  Seen by gastroenterology, no plans for inpatient endoscopy. - GI following.   Acute kidney injury History of renal cancer status post left partial nephrectomy - Suspect contrast-induced nephropathy.  Renal function around baseline 1.3 - Cleared by nephrology for discharge - Renal ultrasound-no acute findings   4m right liver lesion: Seen on CT (report below):  -Will need outpatient MRI in 3 months for follow-up   Dizziness -Unclear etiology, possibly underlying disease.  Vestibular therapy ongoing, meclizine 25  mg 3 times daily - MRI brain-shows chronic infarct.   Leukocytosis: Resolved   Diabetes  mellitus type 2, - A1c 8.8.  Holding metformin and glipizide. - Continue sliding scale and Accu-Cheks   Essential hypertension -On Coreg, isosorbide mononitrate   Hyperlipidemia -Statin on hold    Moderate protein calorie malnutrition: Prealbumin 5.7. Severe progressive liver disease contributing.  - Dietitian consult     Goals of care -Discussed by previous provider.  Patient wishes to be DNR but would like to have full scope of treatment until then.    Body mass index is 25.24 kg/m.         Discharge Diagnoses:  Principal Problem:   Choledocholithiasis with obstruction Active Problems:   Portal vein thrombosis   Type 2 diabetes mellitus without complication, without long-term current use of insulin (HCC)   Essential hypertension   Liver cirrhosis secondary to NASH (HCC)   Transaminitis   Jaundice   Leukocytosis   GERD (gastroesophageal reflux disease)   Decompensated liver disease (HCC)   Hyponatremia   AKI (acute kidney injury) ([place])   Malnutrition of moderate degree      Consultations: Gastroenterology Nephrology  Subjective: Patient states his itching is much better after starting cholestyramine yesterday.  Does not have any complaints.  Discharge Exam: Vitals:   04/09/21 2031 04/10/21 0700  BP: 130/66 128/73  Pulse: 68 66  Resp: 16 18  Temp: 98.9 F (37.2 C) 98.9 F (37.2 C)  SpO2: 97% 98%   Vitals:   04/08/21 2113 04/09/21 0807 04/09/21 2031 04/10/21 0700  BP: 114/62 134/60 130/66 128/73  Pulse: 63 62 68 66  Resp: 17 18 16 18   Temp: 98.4 F (36.9 C) 98.5 F (36.9 C) 98.9 F (37.2 C) 98.9 F (37.2 C)  TempSrc: Oral Oral Oral Oral  SpO2: 99% 98% 97% 98%  Weight:      Height:        General: Pt is alert, awake, not in acute distress Cardiovascular: RRR, S1/S2 +, no rubs, no gallops Respiratory: CTA bilaterally, no wheezing, no rhonchi Abdominal: Soft, NT, ND, bowel sounds + Extremities: no edema, no cyanosis Mild jaundice and  scleral icterus noted  Discharge Instructions   Allergies as of 04/10/2021       Reactions   Atorvastatin Other (See Comments)   Unknown   Metoprolol Other (See Comments)   bradycardia   Penicillins Swelling   Swelling at injection site Has patient had a PCN reaction causing immediate rash, facial/tongue/throat swelling, SOB or lightheadedness with hypotension: No Has patient had a PCN reaction causing severe rash involving mucus membranes or skin necrosis: No Has patient had a PCN reaction that required hospitalization No Has patient had a PCN reaction occurring within the last 10 years: No If all of the above answers are "NO", then may proceed with Cephalosporin use.   Rosuvastatin Other (See Comments)   unknown   Simvastatin Other (See Comments)   unknown   Terazosin Other (See Comments)   Orthostatic hypotension        Medication List     STOP taking these medications    apixaban 5 MG Tabs tablet Commonly known as: ELIQUIS   aspirin EC 325 MG tablet   furosemide 40 MG tablet Commonly known as: LASIX   rosuvastatin 20 MG tablet Commonly known as: CRESTOR   spironolactone 100 MG tablet Commonly known as: ALDACTONE       TAKE these medications    acetaminophen 325 MG tablet Commonly known  as: TYLENOL Take 975 mg by mouth 2 (two) times daily.   carvedilol 3.125 MG tablet Commonly known as: COREG Take 3.125 mg by mouth 2 (two) times daily.   cetirizine 5 MG tablet Commonly known as: ZYRTEC Take 5 mg by mouth every morning.   cholestyramine light 4 g packet Commonly known as: PREVALITE Take 1 packet (4 g total) by mouth 2 times daily at 12 noon and 4 pm.   feeding supplement (GLUCERNA SHAKE) Liqd Take 237 mLs by mouth 2 (two) times daily between meals.   glipiZIDE 10 MG tablet Commonly known as: GLUCOTROL Take 10 mg by mouth 2 (two) times daily before a meal.   hydrOXYzine 25 MG tablet Commonly known as: ATARAX/VISTARIL Take 1 tablet (25 mg  total) by mouth 3 (three) times daily as needed for itching or anxiety.   isosorbide mononitrate 30 MG 24 hr tablet Commonly known as: IMDUR Take 90 mg by mouth every morning.   lactulose 10 GM/15ML solution Commonly known as: CHRONULAC Take 30 mLs (20 g total) by mouth 3 (three) times daily.   loratadine 10 MG tablet Commonly known as: CLARITIN Take 10 mg by mouth at bedtime.   meclizine 25 MG tablet Commonly known as: ANTIVERT Take 1 tablet (25 mg total) by mouth 3 (three) times daily.   metFORMIN 500 MG 24 hr tablet Commonly known as: GLUCOPHAGE-XR Take 500 mg by mouth 2 (two) times daily.   pantoprazole 40 MG tablet Commonly known as: PROTONIX Take 1 tablet (40 mg total) by mouth 2 (two) times daily.   polyethylene glycol 17 g packet Commonly known as: MIRALAX / GLYCOLAX Take 8.5 g by mouth daily as needed for mild constipation.   pramoxine-hydrocortisone 1-1 % foam Apply 1 g topically 4 (four) times daily as needed (itching).   triamcinolone cream 0.1 % Commonly known as: KENALOG Apply 1 application topically 2 (two) times daily as needed (itching).        Allergies  Allergen Reactions   Atorvastatin Other (See Comments)    Unknown   Metoprolol Other (See Comments)    bradycardia   Penicillins Swelling    Swelling at injection site Has patient had a PCN reaction causing immediate rash, facial/tongue/throat swelling, SOB or lightheadedness with hypotension: No Has patient had a PCN reaction causing severe rash involving mucus membranes or skin necrosis: No Has patient had a PCN reaction that required hospitalization No Has patient had a PCN reaction occurring within the last 10 years: No If all of the above answers are "NO", then may proceed with Cephalosporin use.   Rosuvastatin Other (See Comments)    unknown   Simvastatin Other (See Comments)    unknown   Terazosin Other (See Comments)    Orthostatic hypotension    You were cared for by a  hospitalist during your hospital stay. If you have any questions about your discharge medications or the care you received while you were in the hospital after you are discharged, you can call the unit and asked to speak with the hospitalist on call if the hospitalist that took care of you is not available. Once you are discharged, your primary care physician will handle any further medical issues. Please note that no refills for any discharge medications will be authorized once you are discharged, as it is imperative that you return to your primary care physician (or establish a relationship with a primary care physician if you do not have one) for your aftercare needs so that  they can reassess your need for medications and monitor your lab values.   Procedures/Studies: MR BRAIN WO CONTRAST  Result Date: 04/06/2021 CLINICAL DATA:  Dizziness, persistent/recurrent, cardiac or vascular cause suspected. History of cirrhosis. EXAM: MRI HEAD WITHOUT CONTRAST TECHNIQUE: Multiplanar, multiecho pulse sequences of the brain and surrounding structures were obtained without intravenous contrast. COMPARISON:  None. FINDINGS: Brain: There is no evidence of an acute infarct, intracranial hemorrhage, mass, midline shift, or extra-axial fluid collection. Scattered small T2 hyperintensities in the cerebral white matter bilaterally are nonspecific but compatible with minimal chronic small vessel ischemic disease. There is symmetric intrinsic T1 hyperintensity in the globi pallidi. A subcentimeter chronic infarct is noted in the right cerebellar hemisphere. There is mild generalized cerebral atrophy. Vascular: Major intracranial vascular flow voids are preserved. Skull and upper cervical spine: Unremarkable bone marrow signal. Sinuses/Orbits: Unremarkable orbits. Mild mucosal thickening in the paranasal sinuses. Trace right mastoid fluid. Other: None. IMPRESSION: 1. No acute intracranial abnormality. 2. Minimal chronic small  vessel ischemic disease in the cerebral white matter. 3. Tiny chronic right cerebellar infarct. 4. T1 hyperintensity in the globi pallidi consistent with chronic liver disease. Electronically Signed   By: Logan Bores M.D.   On: 04/06/2021 13:35   CT ABDOMEN PELVIS W CONTRAST  Result Date: 04/02/2021 CLINICAL DATA:  Abdominal distension EXAM: CT ABDOMEN AND PELVIS WITH CONTRAST TECHNIQUE: Multidetector CT imaging of the abdomen and pelvis was performed using the standard protocol following bolus administration of intravenous contrast. CONTRAST:  51m OMNIPAQUE IOHEXOL 350 MG/ML SOLN COMPARISON:  None. FINDINGS: Lower chest: The visualized lung bases are clear. Multi-vessel coronary artery stenting has been performed. Advanced coronary artery calcification noted. Global cardiac size within normal limits. No pericardial effusion. Hepatobiliary: The liver is shrunken, the liver contour is nodular, and there is hypertrophy of the left and caudate lobes in keeping with changes of advanced cirrhosis. No enhancing intrahepatic mass is identified. No intra or extrahepatic biliary ductal dilation. There is interval thrombosis of the main and intrahepatic right and left portal veins with numerous peribiliary venous collaterals identified. Cholelithiasis without pericholecystic inflammatory change noted. Pancreas: Unremarkable Spleen: Stable splenomegaly with the spleen measuring 15.2 cm in greatest dimension. No intrasplenic lesions are seen. Adrenals/Urinary Tract: The adrenal glands are unremarkable. The kidneys are normal in position. Mild cortical scarring noted within the upper pole of the left kidney. The kidneys are otherwise unremarkable. The bladder is unremarkable. Stomach/Bowel: The stomach and small bowel are unremarkable. The appendix is absent. There is circumferential thickening of the cecum and ascending colon likely reflecting changes of portal colopathy. The colon is otherwise unremarkable. Mild ascites  is present, similar to prior examination. No free intraperitoneal gas. Vascular/Lymphatic: Numerous gastroepiploic venous collaterals and a splenorenal shunt are identified. Moderate aortoiliac atherosclerotic calcification. No aortic aneurysm. No pathologic adenopathy within the abdomen and pelvis. Reproductive: Moderate prostatic enlargement again noted. Other: Tiny fat containing left inguinal hernia and small ascites containing right inguinal hernia are identified. Ventral hernia repair with mesh has been performed. Musculoskeletal: No acute bone abnormality. Osseous structures are age-appropriate. IMPRESSION: Coronary artery calcification. Evidence of prior coronary artery stenting. Changes of advanced cirrhosis. Interval thrombosis of the portal vein with peribiliary venous collateralization. Evidence of portal venous hypertension with portosystemic collateralization, progressive, moderate splenomegaly and mild, stable ascites. Cholelithiasis. Circumferential thickening of the cecum and ascending colon likely reflecting changes of portal colopathy. Infectious or inflammatory colitis is possible, but considered less likely. No evidence of obstruction or perforation. Moderate prostatic enlargement. Aortic  Atherosclerosis (ICD10-I70.0). Electronically Signed   By: Fidela Salisbury M.D.   On: 04/02/2021 02:42   US RENAL  Result Date: 04/05/2021 CLINICAL DATA:  Acute kidney injury and history of prior partial left nephrectomy. EXAM: RENAL / URINARY TRACT ULTRASOUND COMPLETE COMPARISON:  None. FINDINGS: Right Kidney: Renal measurements: 11.8 x 5.3 x 5.5 cm = volume: 179 mL. Echogenicity within normal limits. No mass or hydronephrosis visualized. Left Kidney: Renal measurements: 9.0 x 4.9 x 4.4 cm = volume: 101 mL. Scarring at the level of the upper pole likely is related to the history of partial nephrectomy. No evidence of focal mass or hydronephrosis. Bladder: Appears normal for degree of bladder distention.  Other: None. IMPRESSION: No evidence of hydronephrosis bilaterally. Slightly smaller left kidney with upper pole scarring, likely reflecting the given history of prior partial left nephrectomy. Electronically Signed   By: Aletta Edouard M.D.   On: 04/05/2021 16:51   MR 3D Recon At Scanner  Result Date: 04/02/2021 CLINICAL DATA:  70 year old male presents with cirrhosis and reported portal vein thrombosis. EXAM: MRI ABDOMEN WITHOUT AND WITH CONTRAST (INCLUDING MRCP) TECHNIQUE: Multiplanar multisequence MR imaging of the abdomen was performed both before and after the administration of intravenous contrast. Heavily T2-weighted images of the biliary and pancreatic ducts were obtained, and three-dimensional MRCP images were rendered by post processing. CONTRAST:  43m GADAVIST GADOBUTROL 1 MMOL/ML IV SOLN COMPARISON:  April 02, 2021. FINDINGS: Lower chest: No effusion. No consolidative process. Limited assessment of the lung bases on MRI Hepatobiliary: Signs of hepatic cirrhosis as before. Cholelithiasis. No biliary duct dilation. Tiny biliary calculus in the distal common bile duct suspected on image 157/100, also seen on the coronal MRCP sequences (image 16/12) Small foci of hyperenhancement noted near the dome of the RIGHT hemi liver (image 7/19) 7 mm (Image 11/19) 8 mm. No additional focal abnormalities on arterial phase or areas of washout on venous phase. In aggregate these are LR category 3. Signs of portal vein thrombosis as seen on recent CT imaging in the main and LEFT portal vein. Suggestion of early cavernous transformation about the porta hepatis. Thrombus propagates into the SMV. The splenic vein is either diminutive or occluded. Pancreas: Normal intrinsic T1 signal. No ductal dilation or sign of inflammation. No focal lesion. Spleen:  Spleen enlarged as before. Adrenals/Urinary Tract:  Adrenal glands are normal. No suspicious renal lesion though the kidneys are excluded from view on many of the  axial sequences. No hydronephrosis. No perinephric stranding. Stomach/Bowel: Edema of the RIGHT colon partially visualized. No acute gastrointestinal process aside from this finding on limited assessment on abdominal MRI. Vascular/Lymphatic: Small splenorenal shunt. 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 Aorta with normal caliber. There is no gastrohepatic or hepatoduodenal ligament lymphadenopathy. No retroperitoneal or mesenteric lymphadenopathy. Other:  Small volume ascites is present about the RIGHT hemi liver. Musculoskeletal: No suspicious bone lesions identified. IMPRESSION: Portal venous thrombosis worse than in 2017 and with potential early cavernous transformation suggests there may be some chronicity. Acute on chronic thrombus is considered. Edema about the RIGHT colon, correlate with any symptoms that would suggest portal colopathy exacerbated by portal thrombosis. 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 Cholelithiasis with choledocholithiasis but without biliary duct dilation. Correlation with symptoms may be helpful. Signs of hepatic cirrhosis with splenomegaly and small volume ascites as on the  previous CT. Small foci of hyperenhancement near the dome of the RIGHT hemi liver 7 mm. In aggregate these are LR category 3. Three to six-month follow-up is suggested for further evaluation. Small volume ascites about the RIGHT hemi liver. These results will be called to the ordering clinician or representative by the Radiologist Assistant, and communication documented in the PACS or [employer]. Electronically Signed   By: Zetta Bills M.D.   On: 04/02/2021 12:27   DG Chest Portable 1 View  Result Date: 04/02/2021 CLINICAL DATA:  Worsening cirrhosis and abdominal distension EXAM: PORTABLE CHEST 1 VIEW COMPARISON:   Abdominal CT from earlier today FINDINGS: Low volume chest with indistinct density at the bases attributed atelectasis. Borderline heart size for technique, accentuated by mediastinal fat. Coronary stenting. No Kerley lines, effusion, or pneumothorax. IMPRESSION: Low volume chest with mild atelectasis at the bases. Electronically Signed   By: Jorje Guild M.D.   On: 04/02/2021 05:27   DG ERCP  Result Date: 04/03/2021 CLINICAL DATA:  70 year old male with ERCP EXAM: ERCP TECHNIQUE: Multiple spot images obtained with the fluoroscopic device and submitted for interpretation post-procedure. FLUOROSCOPY TIME:  Fluoroscopy Time:  1 minute 35 seconds COMPARISON:  MR 04/02/2021 FINDINGS: Limited intraoperative fluoroscopic spot images during ERCP. Initial image demonstrates the endoscope projecting over the upper abdomen with safety wire within the extrahepatic biliary ducts. There is subsequently retrograde infusion of contrast with partial opacification. Passage of a balloon retrieval catheter. IMPRESSION: Limited images during ERCP demonstrates deployment of balloon retrieval catheter for treatment of choledocholithiasis. Please refer to the dictated operative report for full details of intraoperative findings and procedure. Electronically Signed   By: Corrie Mckusick D.O.   On: 04/03/2021 16:44   MR ABDOMEN MRCP W WO CONTAST  Result Date: 04/02/2021 CLINICAL DATA:  70 year old male presents with cirrhosis and reported portal vein thrombosis. EXAM: MRI ABDOMEN WITHOUT AND WITH CONTRAST (INCLUDING MRCP) TECHNIQUE: Multiplanar multisequence MR imaging of the abdomen was performed both before and after the administration of intravenous contrast. Heavily T2-weighted images of the biliary and pancreatic ducts were obtained, and three-dimensional MRCP images were rendered by post processing. CONTRAST:  39m GADAVIST GADOBUTROL 1 MMOL/ML IV SOLN COMPARISON:  April 02, 2021. FINDINGS: Lower chest: No effusion. No  consolidative process. Limited assessment of the lung bases on MRI Hepatobiliary: Signs of hepatic cirrhosis as before. Cholelithiasis. No biliary duct dilation. Tiny biliary calculus in the distal common bile duct suspected on image 157/100, also seen on the coronal MRCP sequences (image 16/12) Small foci of hyperenhancement noted near the dome of the RIGHT hemi liver (image 7/19) 7 mm (Image 11/19) 8 mm. No additional focal abnormalities on arterial phase or areas of washout on venous phase. In aggregate these are LR category 3. Signs of portal vein thrombosis as seen on recent CT imaging in the main and LEFT portal vein. Suggestion of early cavernous transformation about the porta hepatis. Thrombus propagates into the SMV. The splenic vein is either diminutive or occluded. Pancreas: Normal intrinsic T1 signal. No ductal dilation or sign of inflammation. No focal lesion. Spleen:  Spleen enlarged as before. Adrenals/Urinary Tract:  Adrenal glands are normal. No suspicious renal lesion though the kidneys are excluded from view on many of the axial sequences. No hydronephrosis. No perinephric stranding. Stomach/Bowel: Edema of the RIGHT colon partially visualized. No acute gastrointestinal process aside from this finding on limited assessment on abdominal MRI. Vascular/Lymphatic: Small splenorenal shunt. Suspected splenic venous occlusion and signs of thrombus in the  main portal vein and LEFT portal vein extending into the first portion of the SMV as described. Aorta with normal caliber. There is no gastrohepatic or hepatoduodenal ligament lymphadenopathy. No retroperitoneal or mesenteric lymphadenopathy. Other:  Small volume ascites is present about the RIGHT hemi liver. Musculoskeletal: No suspicious bone lesions identified. IMPRESSION: Portal venous thrombosis worse than in 2017 and with potential early cavernous transformation suggests there may be some chronicity. Acute on chronic thrombus is considered. Edema  about the RIGHT colon, correlate with any symptoms that would suggest portal colopathy exacerbated by portal thrombosis. 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 Cholelithiasis with choledocholithiasis but without biliary duct dilation. Correlation with symptoms may be helpful. Signs of hepatic cirrhosis with splenomegaly and small volume ascites as on the previous CT. Small foci of hyperenhancement near the dome of the RIGHT hemi liver 7 mm. In aggregate these are LR category 3. Three to six-month follow-up is suggested for further evaluation. Small volume ascites about the RIGHT hemi liver. These results will be called to the ordering clinician or representative by the Radiologist Assistant, and communication documented in the PACS or [employer]. Electronically Signed   By: Zetta Bills M.D.   On: 04/02/2021 12:27     The results of significant diagnostics from this hospitalization (including imaging, microbiology, ancillary and laboratory) are listed below for reference.     Microbiology: Recent Results (from the past 240 hour(s))  Urine Culture     Status: Abnormal   Collection Time: 04/02/21  5:46 AM   Specimen: Urine, Clean Catch  Result Value Ref Range Status   Specimen Description URINE, CLEAN CATCH  Final   Special Requests   Final    NONE Performed at [facility], [address], [place], [place] [address]    Culture MULTIPLE SPECIES PRESENT, SUGGEST RECOLLECTION (A)  Final   Report Status 04/02/2021 FINAL  Final  Resp Panel by RT-PCR (Flu A&B, Covid) Nasopharyngeal Swab     Status: None   Collection Time: 04/02/21  5:46 AM   Specimen: Nasopharyngeal Swab; Nasopharyngeal(NP) swabs in vial transport medium  Result Value Ref Range Status   SARS Coronavirus 2 by RT PCR NEGATIVE NEGATIVE Final    Comment: (NOTE) SARS-CoV-2 target nucleic acids are NOT DETECTED.  The SARS-CoV-2 RNA is generally detectable  in upper respiratory specimens during the acute phase of infection. The lowest concentration of SARS-CoV-2 viral copies this assay can detect is 138 copies/mL. A negative result does not preclude SARS-Cov-2 infection and should not be used as the sole basis for treatment or other patient management decisions. A negative result may occur with  improper specimen collection/handling, submission of specimen other than nasopharyngeal swab, presence of viral mutation(s) within the areas targeted by this assay, and inadequate number of viral copies(<138 copies/mL). A negative result must be combined with clinical observations, patient history, and epidemiological information. The expected result is Negative.  Fact Sheet for Patients:  EntrepreneurPulse.com.au  Fact Sheet for Healthcare Providers:  IncredibleEmployment.be  This test is no t yet approved or cleared by the [place] FDA and  has been authorized for detection and/or diagnosis of SARS-CoV-2 by FDA under an Emergency Use Authorization (EUA). This EUA will remain  in effect (meaning this test can be used) for the duration of the COVID-19 declaration under Section 564(b)(1) of the Act, 21 U.S.C.section 360bbb-3(b)(1), unless the authorization is terminated  or revoked sooner.  Influenza A by PCR NEGATIVE NEGATIVE Final   Influenza B by PCR NEGATIVE NEGATIVE Final    Comment: (NOTE) The Xpert Xpress SARS-CoV-2/FLU/RSV plus assay is intended as an aid in the diagnosis of influenza from Nasopharyngeal swab specimens and should not be used as a sole basis for treatment. Nasal washings and aspirates are unacceptable for Xpert Xpress SARS-CoV-2/FLU/RSV testing.  Fact Sheet for Patients: EntrepreneurPulse.com.au  Fact Sheet for Healthcare Providers: IncredibleEmployment.be  This test is not yet approved or cleared by the [place] FDA and has  been authorized for detection and/or diagnosis of SARS-CoV-2 by FDA under an Emergency Use Authorization (EUA). This EUA will remain in effect (meaning this test can be used) for the duration of the COVID-19 declaration under Section 564(b)(1) of the Act, 21 U.S.C. section 360bbb-3(b)(1), unless the authorization is terminated or revoked.  Performed at [facility], [place] [address] [place], [place] [address]   Surgical pcr screen     Status: None   Collection Time: 04/03/21  8:37 AM   Specimen: Nasal Mucosa; Nasal Swab  Result Value Ref Range Status   MRSA, PCR NEGATIVE NEGATIVE Final   Staphylococcus aureus NEGATIVE NEGATIVE Final    Comment: (NOTE) The Xpert SA Assay (FDA approved for NASAL specimens in patients 26 years of age and older), is one component of a comprehensive surveillance program. It is not intended to diagnose infection nor to guide or monitor treatment. Performed at [facility], [place] [address], [place], [place] [address]   Resp Panel by RT-PCR (Flu A&B, Covid) Nasopharyngeal Swab     Status: None   Collection Time: 04/10/21 12:46 AM   Specimen: Nasopharyngeal Swab; Nasopharyngeal(NP) swabs in vial transport medium  Result Value Ref Range Status   SARS Coronavirus 2 by RT PCR NEGATIVE NEGATIVE Final    Comment: (NOTE) SARS-CoV-2 target nucleic acids are NOT DETECTED.  The SARS-CoV-2 RNA is generally detectable in upper respiratory specimens during the acute phase of infection. The lowest concentration of SARS-CoV-2 viral copies this assay can detect is 138 copies/mL. A negative result does not preclude SARS-Cov-2 infection and should not be used as the sole basis for treatment or other patient management decisions. A negative result may occur with  improper specimen collection/handling, submission of specimen other than nasopharyngeal swab, presence of viral mutation(s) within the areas targeted by this assay, and inadequate number of  viral copies(<138 copies/mL). A negative result must be combined with clinical observations, patient history, and epidemiological information. The expected result is Negative.  Fact Sheet for Patients:  EntrepreneurPulse.com.au  Fact Sheet for Healthcare Providers:  IncredibleEmployment.be  This test is no t yet approved or cleared by the [place] FDA and  has been authorized for detection and/or diagnosis of SARS-CoV-2 by FDA under an Emergency Use Authorization (EUA). This EUA will remain  in effect (meaning this test can be used) for the duration of the COVID-19 declaration under Section 564(b)(1) of the Act, 21 U.S.C.section 360bbb-3(b)(1), unless the authorization is terminated  or revoked sooner.       Influenza A by PCR NEGATIVE NEGATIVE Final   Influenza B by PCR NEGATIVE NEGATIVE Final    Comment: (NOTE) The Xpert Xpress SARS-CoV-2/FLU/RSV plus assay is intended as an aid in the diagnosis of influenza from Nasopharyngeal swab specimens and should not be used as a sole basis for treatment. Nasal washings and aspirates are unacceptable for Xpert Xpress SARS-CoV-2/FLU/RSV testing.  Fact Sheet for Patients: EntrepreneurPulse.com.au  Fact Sheet  for Healthcare Providers: IncredibleEmployment.be  This test is not yet approved or cleared by the [place] and has been authorized for detection and/or diagnosis of SARS-CoV-2 by FDA under an Emergency Use Authorization (EUA). This EUA will remain in effect (meaning this test can be used) for the duration of the COVID-19 declaration under Section 564(b)(1) of the Act, 21 U.S.C. section 360bbb-3(b)(1), unless the authorization is terminated or revoked.  Performed at [facility], [place] [address], [place], [place] [address]      Labs: BNP (last 3 results) No results for input(s): BNP in the last 8760 hours. Basic Metabolic  Panel: Recent Labs  Lab 04/06/21 0250 04/07/21 0222 04/08/21 0233 04/09/21 0123 04/10/21 0203  NA 133* 135 133* 135 130*  K 4.9 4.8 5.1 5.2* 5.1  CL 106 107 105 104 102  CO2 19* 20* 21* 23 21*  GLUCOSE 252* 193* 183* 176* 218*  BUN 51* 38* 30* 32* 34*  CREATININE 1.63* 1.38* 1.22 1.39* 1.37*  CALCIUM 8.8* 9.1 8.8* 8.7* 8.5*  MG  --   --   --  1.8 2.0  PHOS 2.6  --   --   --   --    Liver Function Tests: Recent Labs  Lab 04/06/21 0250 04/07/21 0222 04/08/21 0233 04/09/21 0123 04/10/21 0203  AST 139* 173* 172* 155* 156*  ALT 90* 114* 122* 114* 115*  ALKPHOS 184* 176* 185* 192* 193*  BILITOT 8.6* 9.5* 10.4* 11.1* 12.3*  PROT 6.2* 6.2* 6.4* 6.2* 6.3*  ALBUMIN 2.3*  2.3* 2.3* 2.2* 2.0* 2.0*   No results for input(s): LIPASE, AMYLASE in the last 168 hours. Recent Labs  Lab 04/06/21 1324 04/08/21 0233  AMMONIA 113* 75*   CBC: Recent Labs  Lab 04/06/21 1324 04/07/21 0222 04/08/21 0233 04/09/21 0123 04/10/21 0203  WBC 7.9 7.2 8.5 8.9 9.6  HGB 9.5* 10.1* 10.9* 10.3* 10.7*  HCT 27.9* 29.0* 31.8* 30.0* 31.3*  MCV 93.9 92.7 94.4 94.6 95.4  PLT 148* 143* 141* 148* 164   Cardiac Enzymes: No results for input(s): CKTOTAL, CKMB, CKMBINDEX, TROPONINI in the last 168 hours. BNP: Invalid input(s): POCBNP CBG: Recent Labs  Lab 04/09/21 0701 04/09/21 1216 04/09/21 1702 04/09/21 2032 04/10/21 0647  GLUCAP 125* 243* 177* 270* 141*   D-Dimer No results for input(s): DDIMER in the last 72 hours. Hgb A1c No results for input(s): HGBA1C in the last 72 hours. Lipid Profile No results for input(s): CHOL, HDL, LDLCALC, TRIG, CHOLHDL, LDLDIRECT in the last 72 hours. Thyroid function studies No results for input(s): TSH, T4TOTAL, T3FREE, THYROIDAB in the last 72 hours.  Invalid input(s): FREET3 Anemia work up No results for input(s): VITAMINB12, FOLATE, FERRITIN, TIBC, IRON, RETICCTPCT in the last 72 hours. Urinalysis    Component Value Date/Time   COLORURINE AMBER  (A) 04/02/2021 0546   APPEARANCEUR CLEAR 04/02/2021 0546   LABSPEC 1.036 (H) 04/02/2021 0546   PHURINE 5.0 04/02/2021 0546   GLUCOSEU NEGATIVE 04/02/2021 0546   HGBUR NEGATIVE 04/02/2021 0546   BILIRUBINUR SMALL (A) 04/02/2021 0546   KETONESUR NEGATIVE 04/02/2021 0546   PROTEINUR NEGATIVE 04/02/2021 0546   NITRITE NEGATIVE 04/02/2021 0546   LEUKOCYTESUR NEGATIVE 04/02/2021 0546   Sepsis Labs Invalid input(s): PROCALCITONIN,  WBC,  LACTICIDVEN Microbiology Recent Results (from the past 240 hour(s))  Urine Culture     Status: Abnormal   Collection Time: 04/02/21  5:46 AM   Specimen: Urine, Clean Catch  Result Value Ref Range Status   Specimen Description  URINE, CLEAN CATCH  Final   Special Requests   Final    NONE Performed at [facility], [place] [address], [place], [place] [address]    Culture MULTIPLE SPECIES PRESENT, SUGGEST RECOLLECTION (A)  Final   Report Status 04/02/2021 FINAL  Final  Resp Panel by RT-PCR (Flu A&B, Covid) Nasopharyngeal Swab     Status: None   Collection Time: 04/02/21  5:46 AM   Specimen: Nasopharyngeal Swab; Nasopharyngeal(NP) swabs in vial transport medium  Result Value Ref Range Status   SARS Coronavirus 2 by RT PCR NEGATIVE NEGATIVE Final    Comment: (NOTE) SARS-CoV-2 target nucleic acids are NOT DETECTED.  The SARS-CoV-2 RNA is generally detectable in upper respiratory specimens during the acute phase of infection. The lowest concentration of SARS-CoV-2 viral copies this assay can detect is 138 copies/mL. A negative result does not preclude SARS-Cov-2 infection and should not be used as the sole basis for treatment or other patient management decisions. A negative result may occur with  improper specimen collection/handling, submission of specimen other than nasopharyngeal swab, presence of viral mutation(s) within the areas targeted by this assay, and inadequate number of viral copies(<138 copies/mL). A negative result must be combined  with clinical observations, patient history, and epidemiological information. The expected result is Negative.  Fact Sheet for Patients:  EntrepreneurPulse.com.au  Fact Sheet for Healthcare Providers:  IncredibleEmployment.be  This test is no t yet approved or cleared by the [place] FDA and  has been authorized for detection and/or diagnosis of SARS-CoV-2 by FDA under an Emergency Use Authorization (EUA). This EUA will remain  in effect (meaning this test can be used) for the duration of the COVID-19 declaration under Section 564(b)(1) of the Act, 21 U.S.C.section 360bbb-3(b)(1), unless the authorization is terminated  or revoked sooner.       Influenza A by PCR NEGATIVE NEGATIVE Final   Influenza B by PCR NEGATIVE NEGATIVE Final    Comment: (NOTE) The Xpert Xpress SARS-CoV-2/FLU/RSV plus assay is intended as an aid in the diagnosis of influenza from Nasopharyngeal swab specimens and should not be used as a sole basis for treatment. Nasal washings and aspirates are unacceptable for Xpert Xpress SARS-CoV-2/FLU/RSV testing.  Fact Sheet for Patients: EntrepreneurPulse.com.au  Fact Sheet for Healthcare Providers: IncredibleEmployment.be  This test is not yet approved or cleared by the [place] FDA and has been authorized for detection and/or diagnosis of SARS-CoV-2 by FDA under an Emergency Use Authorization (EUA). This EUA will remain in effect (meaning this test can be used) for the duration of the COVID-19 declaration under Section 564(b)(1) of the Act, 21 U.S.C. section 360bbb-3(b)(1), unless the authorization is terminated or revoked.  Performed at [facility], [place] [address] [place], [place] [address]   Surgical pcr screen     Status: None   Collection Time: 04/03/21  8:37 AM   Specimen: Nasal Mucosa; Nasal Swab  Result Value Ref Range Status   MRSA, PCR NEGATIVE NEGATIVE Final    Staphylococcus aureus NEGATIVE NEGATIVE Final    Comment: (NOTE) The Xpert SA Assay (FDA approved for NASAL specimens in patients 63 years of age and older), is one component of a comprehensive surveillance program. It is not intended to diagnose infection nor to guide or monitor treatment. Performed at [facility], [place] [address] [place], [place] [address]   Resp Panel by RT-PCR (Flu A&B, Covid) Nasopharyngeal Swab     Status: None   Collection Time: 04/10/21 12:46  AM   Specimen: Nasopharyngeal Swab; Nasopharyngeal(NP) swabs in vial transport medium  Result Value Ref Range Status   SARS Coronavirus 2 by RT PCR NEGATIVE NEGATIVE Final    Comment: (NOTE) SARS-CoV-2 target nucleic acids are NOT DETECTED.  The SARS-CoV-2 RNA is generally detectable in upper respiratory specimens during the acute phase of infection. The lowest concentration of SARS-CoV-2 viral copies this assay can detect is 138 copies/mL. A negative result does not preclude SARS-Cov-2 infection and should not be used as the sole basis for treatment or other patient management decisions. A negative result may occur with  improper specimen collection/handling, submission of specimen other than nasopharyngeal swab, presence of viral mutation(s) within the areas targeted by this assay, and inadequate number of viral copies(<138 copies/mL). A negative result must be combined with clinical observations, patient history, and epidemiological information. The expected result is Negative.  Fact Sheet for Patients:  EntrepreneurPulse.com.au  Fact Sheet for Healthcare Providers:  IncredibleEmployment.be  This test is no t yet approved or cleared by the [place] FDA and  has been authorized for detection and/or diagnosis of SARS-CoV-2 by FDA under an Emergency Use Authorization (EUA). This EUA will remain  in effect (meaning this test can be used) for the duration of  the COVID-19 declaration under Section 564(b)(1) of the Act, 21 U.S.C.section 360bbb-3(b)(1), unless the authorization is terminated  or revoked sooner.       Influenza A by PCR NEGATIVE NEGATIVE Final   Influenza B by PCR NEGATIVE NEGATIVE Final    Comment: (NOTE) The Xpert Xpress SARS-CoV-2/FLU/RSV plus assay is intended as an aid in the diagnosis of influenza from Nasopharyngeal swab specimens and should not be used as a sole basis for treatment. Nasal washings and aspirates are unacceptable for Xpert Xpress SARS-CoV-2/FLU/RSV testing.  Fact Sheet for Patients: EntrepreneurPulse.com.au  Fact Sheet for Healthcare Providers: IncredibleEmployment.be  This test is not yet approved or cleared by the [place] FDA and has been authorized for detection and/or diagnosis of SARS-CoV-2 by FDA under an Emergency Use Authorization (EUA). This EUA will remain in effect (meaning this test can be used) for the duration of the COVID-19 declaration under Section 564(b)(1) of the Act, 21 U.S.C. section 360bbb-3(b)(1), unless the authorization is terminated or revoked.  Performed at [facility], [place] [address], [place], [place] [address]      Time coordinating discharge:  I have spent 35 minutes face to face with the patient and on the ward discussing the patients care, assessment, plan and disposition with other care givers. >50% of the time was devoted counseling the patient about the risks and benefits of treatment/Discharge disposition and coordinating care.   SIGNED:   Damita Lack, MD  [facility] 04/10/2021, 9:41 AM   If 7PM-7AM, please contact night-coverage

## 2021-04-10 NOTE — Progress Notes (Signed)
[place] visited pt. as follow-up to [place] visit last week to assess whether pt.'s advanced directive was in need of notarization.  Pt. initially said all paperwork had been finalized but upon seeing that AD was not in pt.'s chart [place] found completed but unnotarized document under a pile of wound dressings on shelf.  Arrangements made w/notary and witnesses to coordinate notarization this afternoon.  No further needs expressed at this time.

## 2021-04-10 NOTE — TOC Progression Note (Addendum)
Transition of Care [facility]) - Initial/Assessment Note    Patient Details  Name: Jerry [name] MRN: [id number] Date of Birth: [birth year]  [facility] CM/SW Contact:    Milinda Antis, [place] Phone Number: 04/10/2021, 9:46 AM  Clinical Narrative:                 CSW called Neoma Laming with admissions at [facility]) to receive confirmation of admission.  There was no answer.  CSW left a VM requesting a returned call.   11:35- CSW called admissions with [facility] again to confirm the patient's discharge to the facility.  CSW was informed that the facility cannot find the patient's Medicare number and cannot admit the patient without it.  The facility will contact the patient's VA representative in an attempt to retrieve the number.   14:00 CSW requested the patient's Medicare number and gave it to admissions at [facility] so that they could verify that the patient's insurance will cover his stay.  14:48-CSW notified by admissions at [facility] that there is an issue with the Medicare insurance and that the [place] will have to approve prior to the patient coming.  CSW informed that the [place] will send a functional assessment to be completed in order to for the patient to be evaluated for [employer] coverage.  If the patient is approved, he can possibly discharge to the facility.  Pending: VA insurance approval   Patient Goals and CMS Choice        Expected Discharge Plan and Services           Expected Discharge Date: 04/09/21                                    Prior Living Arrangements/Services                       Activities of Daily Living Home Assistive Devices/Equipment: Dentures (specify type), Hearing aid ADL Screening (condition at time of admission) Patient's cognitive ability adequate to safely complete daily activities?: Yes Is the patient deaf or have difficulty hearing?: Yes Does the patient have difficulty seeing, even when wearing glasses/contacts?: Yes Does the  patient have difficulty concentrating, remembering, or making decisions?: No Patient able to express need for assistance with ADLs?: Yes Does the patient have difficulty dressing or bathing?: No Independently performs ADLs?: Yes (appropriate for developmental age) Does the patient have difficulty walking or climbing stairs?: Yes Weakness of Legs: None Weakness of Arms/Hands: None  Permission Sought/Granted                  Emotional Assessment              Admission diagnosis:  Cholecystitis [K81.9] Hyperammonemia ([place]) [E72.20] Transaminitis [R74.01] Encephalopathy [G93.40] Elevated transaminase level [R74.01] Patient Active Problem List   Diagnosis Date Noted   Malnutrition of moderate degree 04/05/2021   Decompensated liver disease ([place])    Hyponatremia    AKI (acute kidney injury) (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   Chest pain due to CAD ([place]) 03/27/2017   Upper GI bleed    S/P coronary artery  stent placement    Coronary artery disease involving native coronary artery of native heart with angina pectoris ([place])    Chest pain 03/19/2017   Liver cirrhosis secondary to NASH ([place]) 03/19/2017   Portal vein thrombosis 01/21/2016   Type 2 diabetes mellitus without complication, without long-term current use of insulin ([place]) 01/21/2016   Coronary artery disease due to lipid rich plaque 01/21/2016   Essential hypertension    PCP:  Deberah Pelton, MD Pharmacy:   [facility] [place], [place] AT [place] [address] Phone: [phone] Fax: [place], [place] - [address] [place] [address] Phone: [phone] Fax: [phone]  [id number] [place], [place] - [address] AT [facility] [address] [place] [address] Phone: [phone] Fax: [phone]     Social Determinants of Health ([place]) Interventions    Readmission Risk Interventions No flowsheet data found.

## 2021-04-11 ENCOUNTER — Inpatient Hospital Stay (HOSPITAL_COMMUNITY): Payer: No Typology Code available for payment source

## 2021-04-11 HISTORY — PX: IR PARACENTESIS: IMG2679

## 2021-04-11 LAB — COMPREHENSIVE METABOLIC PANEL
ALT: 114 U/L — ABNORMAL HIGH (ref 0–44)
ALT: 115 U/L — ABNORMAL HIGH (ref 0–44)
AST: 168 U/L — ABNORMAL HIGH (ref 15–41)
AST: 166 U/L — ABNORMAL HIGH (ref 15–41)
Albumin: 1.9 g/dL — ABNORMAL LOW (ref 3.5–5.0)
Albumin: 2.5 g/dL — ABNORMAL LOW (ref 3.5–5.0)
Alkaline Phosphatase: 204 U/L — ABNORMAL HIGH (ref 38–126)
Alkaline Phosphatase: 194 U/L — ABNORMAL HIGH (ref 38–126)
Anion gap: 7 (ref 5–15)
Anion gap: 8 (ref 5–15)
BUN: 34 mg/dL — ABNORMAL HIGH (ref 8–23)
BUN: 41 mg/dL — ABNORMAL HIGH (ref 8–23)
CO2: 19 mmol/L — ABNORMAL LOW (ref 22–32)
CO2: 20 mmol/L — ABNORMAL LOW (ref 22–32)
Calcium: 8.7 mg/dL — ABNORMAL LOW (ref 8.9–10.3)
Calcium: 8.9 mg/dL (ref 8.9–10.3)
Chloride: 107 mmol/L (ref 98–111)
Chloride: 105 mmol/L (ref 98–111)
Creatinine, Ser: 1.29 mg/dL — ABNORMAL HIGH (ref 0.61–1.24)
Creatinine, Ser: 1.52 mg/dL — ABNORMAL HIGH (ref 0.61–1.24)
GFR, Estimated: 59 mL/min — ABNORMAL LOW (ref >60)
GFR, Estimated: 49 mL/min — ABNORMAL LOW (ref >60)
Glucose, Bld: 136 mg/dL — ABNORMAL HIGH (ref 70–99)
Glucose, Bld: 208 mg/dL — ABNORMAL HIGH (ref 70–99)
Potassium: 5.4 mmol/L — ABNORMAL HIGH (ref 3.5–5.1)
Potassium: 5.6 mmol/L — ABNORMAL HIGH (ref 3.5–5.1)
Sodium: 133 mmol/L — ABNORMAL LOW (ref 135–145)
Sodium: 133 mmol/L — ABNORMAL LOW (ref 135–145)
Total Bilirubin: 13.1 mg/dL — ABNORMAL HIGH (ref 0.3–1.2)
Total Bilirubin: 14.8 mg/dL — ABNORMAL HIGH (ref 0.3–1.2)
Total Protein: 6.1 g/dL — ABNORMAL LOW (ref 6.5–8.1)
Total Protein: 6.8 g/dL (ref 6.5–8.1)

## 2021-04-11 LAB — LACTATE DEHYDROGENASE, PLEURAL OR PERITONEAL FLUID: LD, Fluid: 19 U/L (ref 3–23)

## 2021-04-11 LAB — GRAM STAIN

## 2021-04-11 LAB — GLUCOSE, CAPILLARY
Glucose-Capillary: 133 mg/dL — ABNORMAL HIGH (ref 70–99)
Glucose-Capillary: 222 mg/dL — ABNORMAL HIGH (ref 70–99)
Glucose-Capillary: 280 mg/dL — ABNORMAL HIGH (ref 70–99)
Glucose-Capillary: 177 mg/dL — ABNORMAL HIGH (ref 70–99)

## 2021-04-11 LAB — BODY FLUID CELL COUNT WITH DIFFERENTIAL
Eos, Fluid: 0 %
Lymphs, Fluid: 52 %
Monocyte-Macrophage-Serous Fluid: 16 % — ABNORMAL LOW (ref 50–90)
Neutrophil Count, Fluid: 32 % — ABNORMAL HIGH (ref 0–25)
Total Nucleated Cell Count, Fluid: 186 cu mm (ref 0–1000)

## 2021-04-11 LAB — CBC
HCT: 31.8 % — ABNORMAL LOW (ref 39.0–52.0)
Hemoglobin: 11.1 g/dL — ABNORMAL LOW (ref 13.0–17.0)
MCH: 32.7 pg (ref 26.0–34.0)
MCHC: 34.9 g/dL (ref 30.0–36.0)
MCV: 93.8 fL (ref 80.0–100.0)
Platelets: 148 10*3/uL — ABNORMAL LOW (ref 150–400)
RBC: 3.39 MIL/uL — ABNORMAL LOW (ref 4.22–5.81)
RDW: 19.3 % — ABNORMAL HIGH (ref 11.5–15.5)
WBC: 9.6 10*3/uL (ref 4.0–10.5)
nRBC: 0 % (ref 0.0–0.2)

## 2021-04-11 LAB — MAGNESIUM: Magnesium: 1.8 mg/dL (ref 1.7–2.4)

## 2021-04-11 LAB — ALBUMIN, PLEURAL OR PERITONEAL FLUID: Albumin, Fluid: <1.5 g/dL

## 2021-04-11 MED ORDER — CAMPHOR-MENTHOL 0.5-0.5 % EX LOTN
TOPICAL_LOTION | CUTANEOUS | Status: DC | PRN
  Filled 2021-04-11: qty 222

## 2021-04-11 MED ORDER — SODIUM ZIRCONIUM CYCLOSILICATE 10 G PO PACK
10.0000 g | PACK | Freq: Once | ORAL | Status: AC
  Administered 2021-04-11: 10 g via ORAL
  Filled 2021-04-11: qty 1

## 2021-04-11 MED ORDER — APIXABAN 2.5 MG PO TABS: 2.5000 mg | ORAL_TABLET | Freq: Two times a day (BID) | ORAL | Status: DC

## 2021-04-11 MED ORDER — LIDOCAINE HCL (PF) 1 % IJ SOLN
INTRAMUSCULAR | Status: DC | PRN
  Administered 2021-04-11: 10 mL

## 2021-04-11 MED ORDER — ALBUMIN HUMAN 25 % IV SOLN
25.0000 g | Freq: Once | INTRAVENOUS | Status: AC
  Administered 2021-04-11: 25 g via INTRAVENOUS
  Filled 2021-04-11: qty 100

## 2021-04-11 MED ORDER — LIDOCAINE HCL 1 % IJ SOLN
INTRAMUSCULAR | Status: AC
  Filled 2021-04-11: qty 20

## 2021-04-11 MED ORDER — CAMPHOR-MENTHOL 0.5-0.5 % EX LOTN
TOPICAL_LOTION | Freq: Two times a day (BID) | CUTANEOUS | Status: DC
  Filled 2021-04-11: qty 222

## 2021-04-11 MED ORDER — APIXABAN 5 MG PO TABS
5.0000 mg | ORAL_TABLET | Freq: Two times a day (BID) | ORAL | Status: DC
  Administered 2021-04-11 – 2021-04-12 (×2): 5 mg via ORAL
  Filled 2021-04-11 (×2): qty 1

## 2021-04-11 NOTE — Plan of Care (Signed)
  Problem: Education: Goal: Knowledge of General Education information will improve Description: Including pain rating scale, medication(s)/side effects and non-pharmacologic comfort measures Outcome: Progressing   Problem: Clinical Measurements: Goal: Ability to maintain clinical measurements within normal limits will improve Outcome: Progressing   Problem: Nutrition: Goal: Adequate nutrition will be maintained Outcome: Progressing   Problem: Pain Managment: Goal: General experience of comfort will improve Outcome: Progressing   Problem: Safety: Goal: Ability to remain free from injury will improve Outcome: Progressing

## 2021-04-11 NOTE — Progress Notes (Signed)
[facility] ROUNDING NOTE   Subjective: Patient has been feeling more bloated, distended.  No nausea or vomiting.  Also feeling a little light-headed but reports clear cognition.  No BM noted today.  Tbili increasing over the past 3 days. He underwent a 1L paracentesis today which improved his abdominal distention. Itching much improved with the cholestyramine     Objective: Vital signs in last 24 hours: Temp:  [97.5 F (36.4 C)-98.7 F (37.1 C)] 98.4 F (36.9 C) (10/06 1518) Pulse Rate:  [64-72] 72 (10/06 1518) Resp:  [15-17] 15 (10/06 1518) BP: (120-135)/(60-64) 127/60 (10/06 1518) SpO2:  [95 %-98 %] 96 % (10/06 1518) Last BM Date: 04/11/21 General: NAD, family present (son, girlfriend, daughter-in-law) Lungs:  CTA b/l, no w/r/r Heart:  RRR, no m/r/g Abdomen:  Soft, NT, ND, +BS Ext:  No c/c/e, no asterixis    Intake/Output from previous day: 10/05 0701 - 10/06 0700 In: 360 [P.O.:360] Out: 400 [Urine:400] Intake/Output this shift: Total I/O In: 480 [P.O.:480] Out: 700 [Urine:700]   Lab Results: Recent Labs    04/09/21 0123 04/10/21 0203 04/11/21 0534  WBC 8.9 9.6 9.6  HGB 10.3* 10.7* 11.1*  PLT 148* 164 148*  MCV 94.6 95.4 93.8   BMET Recent Labs    04/09/21 0123 04/10/21 0203 04/11/21 0230  NA 135 130* 133*  K 5.2* 5.1 5.4*  CL 104 102 107  CO2 23 21* 19*  GLUCOSE 176* 218* 136*  BUN 32* 34* 34*  CREATININE 1.39* 1.37* 1.29*  CALCIUM 8.7* 8.5* 8.7*   LFT Recent Labs    04/09/21 0123 04/10/21 0203 04/11/21 0230  PROT 6.2* 6.3* 6.1*  ALBUMIN 2.0* 2.0* 1.9*  AST 155* 156* 168*  ALT 114* 115* 114*  ALKPHOS 192* 193* 204*  BILITOT 11.1* 12.3* 13.1*   PT/INR No results for input(s): INR in the last 72 hours.    Imaging/Other results: IR Paracentesis  Result Date: 04/11/2021 INDICATION: Patient with history of cirrhosis, portal vein thrombosis, recurrent ascites. Request to IR for diagnostic and therapeutic paracentesis  EXAM: ULTRASOUND GUIDED DIAGNOSTIC AND THERAPEUTIC PARACENTESIS MEDICATIONS: 8 mL 1% lidocaine COMPLICATIONS: None immediate. PROCEDURE: Informed written consent was obtained from the patient after a discussion of the risks, benefits and alternatives to treatment. A timeout was performed prior to the initiation of the procedure. Initial ultrasound scanning demonstrates a small amount of ascites within the left lower abdominal quadrant. The left lower abdomen was prepped and draped in the usual sterile fashion. 1% lidocaine was used for local anesthesia. Following this, a 6 Fr Safe-T-Centesis catheter was introduced. An ultrasound image was saved for documentation purposes. The paracentesis was performed. The catheter was removed and a dressing was applied. The patient tolerated the procedure well without immediate post procedural complication. FINDINGS: A total of approximately 1.2L of clear, bright yellow fluid was removed. Samples were sent to the laboratory as requested by the clinical team. IMPRESSION: Successful ultrasound-guided paracentesis yielding 1.2 liters of peritoneal fluid. Read by Candiss Norse, PA-C Electronically Signed   By: Miachel Roux M.D.   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 In the past few days, he has had worsened hyperbilirubinemia with increased bloating but no other focal symptoms  or fevers.  He had paracentesis today which helped his bloating and will r/o SBP.  I had a long discussion with the patient's family today about the patient's condition. I explained to in detail what cirrhosis is and how much of what he is dealing  with is the result of known complications of cirrhosis.   We discussed the reduced life expectancy in these situations and the lack of a 'cure' for his cirrhosis other than a liver transplant.  I explained that the patient is not a reasonable transplant candidate because of his age and frail state.   Although I am not certain, I think the clot progression may have had a role in worsening his liver function, as we have not found any other etiologies.  I explained the concerns of ongoing anticoagulation in a patient with know large gastroesophageal varices and portal hypertension as well as the risks of no anticoagulation.   We discussed what HE is and how it is managed.  I mentioned rifaximin, and the son would like to pursue this medication as an outpatient if possible    Decompensated cirrhosis, MELDNa 24, not a transplant candidate -  Recommend infectious work up if [place] continues to trend up. -  Recommended not discharging patient yet, given lab trends  Hepatic encephalopathy - Patient's family requested starting Rifaximin which is reasonable given the patient's difficulty with diarrhea and incontinence. - Rifaximin 550 mg PO BID - Continue lactulose BID for now  Ascites - Holding diuretics given AKI - Recommend restarting lasix 20 mg PO daily given relatively normal renal function and reaccumulation of ascites   Pruritis - Continue cholestyramine 4gm bid, should be given 1 hr after other oral medications as this has helped his pruritis - Use anti-histamines sparingly given HE   PVT/hx of MVT - Given the patient's history of MVT and progression of PVT, continued anticoagulation is recommended.  If GI bleed occurs, then the benefits of anticoagulation should be reassessed   Esophagal varices/PHG - Continue Coreg - Recommend once daily PPI   Questionable GI bleed/FOBT - Patient has portal hypertensive gastropathy which would likely cause a positive FOBT.  Hgb stable.  No need for  endoscopic evaluation.   Hepatic lesion - Will need repeat MRI in 3-6 months.   AKI - Appears resolved, continue to avoid NSAIDs    Daryel November, MD  04/11/2021, 6:28 PM [facility] Pager [phone]

## 2021-04-11 NOTE — TOC Progression Note (Signed)
Transition of Care [facility]) - Initial/Assessment Note    Patient Details  Name: Jerry [name] MRN: [id number] Date of Birth: [birth year]  [facility]) CM/SW Contact:    Milinda Antis, [place] Phone Number: 04/11/2021, 4:34 PM  Clinical Narrative:                 CSW received notification from Neoma Laming with [facility] (SNF) that the [facility] has now approved SNF and that the patient could come today.    CSW notified the care team and was informed that the patient is now not medically ready and is awaiting GI evaluation and clearance.   TOC Barrier to d/c:  patient not medically ready, discharge summary   Patient Goals and CMS Choice        Expected Discharge Plan and Services           Expected Discharge Date: 04/09/21                                    Prior Living Arrangements/Services                       Activities of Daily Living Home Assistive Devices/Equipment: Dentures (specify type), Hearing aid ADL Screening (condition at time of admission) Patient's cognitive ability adequate to safely complete daily activities?: Yes Is the patient deaf or have difficulty hearing?: Yes Does the patient have difficulty seeing, even when wearing glasses/contacts?: Yes Does the patient have difficulty concentrating, remembering, or making decisions?: No Patient able to express need for assistance with ADLs?: Yes Does the patient have difficulty dressing or bathing?: No Independently performs ADLs?: Yes (appropriate for developmental age) Does the patient have difficulty walking or climbing stairs?: Yes Weakness of Legs: None Weakness of Arms/Hands: None  Permission Sought/Granted                  Emotional Assessment              Admission diagnosis:  Cholecystitis [K81.9] Hyperammonemia ([place]) [E72.20] Transaminitis [R74.01] Encephalopathy [G93.40] Elevated transaminase level [R74.01] Patient Active Problem List   Diagnosis Date Noted    Malnutrition of moderate degree 04/05/2021   Decompensated liver disease ([place])    Hyponatremia    AKI (acute kidney injury) ([place])    Transaminitis 04/02/2021   Jaundice 04/02/2021   Choledocholithiasis with obstruction 04/02/2021   Leukocytosis 04/02/2021   GERD (gastroesophageal reflux disease) 04/02/2021   Portal hypertension ([place])    Choledocholithiasis    Elevated liver enzymes    Status post reverse arthroplasty of shoulder, 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   Chest pain due to CAD ([place]) 03/27/2017   Upper GI bleed    S/P coronary artery stent placement    Coronary artery disease involving native coronary artery of native heart with angina pectoris ([place])    Chest pain 03/19/2017   Liver cirrhosis secondary to NASH ([place]) 03/19/2017   Portal vein thrombosis 01/21/2016   Type 2 diabetes mellitus without complication, without long-term current use of insulin ([place]) 01/21/2016   Coronary artery disease due to lipid rich plaque 01/21/2016   Essential hypertension    PCP:  Deberah Pelton, MD Pharmacy:   [facility] [place], [address] [place] [address] Phone: [phone] Fax: [place], [place] -  [address] [place] [address] Phone: [phone] Fax: [phone]  [id number] [place], [place] - [address] AT [address] [place] [address] Phone: [phone] Fax: [phone]     Social Determinants of Health (SDOH) Interventions    Readmission Risk Interventions No flowsheet data found.

## 2021-04-11 NOTE — Progress Notes (Signed)
PT Cancellation Note  Patient Details Name: Jerry [name] MRN: [id number] DOB: [birth year]   Cancelled Treatment:    Reason Eval/Treat Not Completed: Patient declined, no reason specified.  Pt reports he just worked with OT.  He is too fatigued to get up with PT again.  Son and daughter in law in the room and interested in having him practice with a rollator next therapy session.  They are also interested in taking him to the windows in the lobby area of the unit where he can sit in the sunshine.  RN made aware via secure chat of family's interest in sitting with him in the lobby.  PT to check back tomorrow.   Thanks,  Jerry [name], PT, DPT  Acute Rehabilitation Ortho Tech Supervisor [phone] pager #[phone] office      Jerry [name] Jerry [name] 04/11/2021, 4:55 PM

## 2021-04-11 NOTE — Procedures (Signed)
PROCEDURE SUMMARY:  Successful image-guided paracentesis from the left lower abdomen.  Yielded 1.2 liters of clear, bright yellow fluid.  No immediate complications.  EBL < 1 mL Patient tolerated well.   Specimen was sent for labs.  Please see imaging section of Epic for full dictation.  Joaquim Nam PA-C 04/11/2021 1:14 PM

## 2021-04-11 NOTE — Progress Notes (Signed)
ANTICOAGULATION CONSULT NOTE - Initial Consult  Pharmacy Consult for Eliquis Indication:  portal vein thrombosis  Allergies  Allergen Reactions   Atorvastatin Other (See Comments)    Unknown   Metoprolol Other (See Comments)    bradycardia   Penicillins Swelling    Swelling at injection site Has patient had a PCN reaction causing immediate rash, facial/tongue/throat swelling, SOB or lightheadedness with hypotension: No Has patient had a PCN reaction causing severe rash involving mucus membranes or skin necrosis: No Has patient had a PCN reaction that required hospitalization No Has patient had a PCN reaction occurring within the last 10 years: No If all of the above answers are "NO", then may proceed with Cephalosporin use.   Rosuvastatin Other (See Comments)    unknown   Simvastatin Other (See Comments)    unknown   Terazosin Other (See Comments)    Orthostatic hypotension    Patient Measurements: Height: 5' 11"  (180.3 cm) Weight: 82.1 kg (181 lb) IBW/kg (Calculated) : 75.3  Vital Signs: Temp: 97.5 F (36.4 C) (10/06 0815) Temp Source: Oral (10/06 0815) BP: 120/64 (10/06 0815) Pulse Rate: 64 (10/06 0815)  Labs: Recent Labs    04/09/21 0123 04/10/21 0203 04/11/21 0230 04/11/21 0534  HGB 10.3* 10.7*  --  11.1*  HCT 30.0* 31.3*  --  31.8*  PLT 148* 164  --  148*  CREATININE 1.39* 1.37* 1.29*  --     Estimated Creatinine Clearance: 55.9 mL/min (A) (by C-G formula based on SCr of 1.29 mg/dL (H)).   Medical History: Past Medical History:  Diagnosis Date   Arthritis    Coronary artery disease    Diabetes mellitus without complication (HCC)    GERD (gastroesophageal reflux disease)    food related only with spicy only   Headache    h/o as a child-migraines   History of appendectomy    History of hiatal hernia    Hx of heart artery stent    Hypercholesteremia    Hypertension    Liver lesion    Primary cancer of kidney or ureter    Kidney cancer with  partial removal of left kidney   PTSD (post-traumatic stress disorder)    Thrombosis    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    Assessment: CC/HPI: Bile stones, esoph varices, acute on chronic PVT  PMH: history of stent placement x2,  DM 2, NASH, PVT, ascites status post paracentesis, kidney cancer, inguinal hernia, CAD, GERD, HLD, PTSD  Anticoag: PTA eliquis held > -Portal vein thrombosis has been there since 2017 now worse on MRI 9/27. Possible acute on chronic PVT. - Was on heparin drip until dark stools noted. - Eliquis LD on 9/26 AM - confirmed w/ pt was taking 2.30m twice daily [place], unclear why reduced dose but likely due to liver dysfunc.(No specific hepatic recomm available) - Hgb 11.1 up, plt 140s (Stable)  Goal of Therapy:  Therapeutic oral anticoagulation  Plan:  With enlarged portal vein thrombosis, will increase Eliquis to 552mBID.  Start tonight after paracentesis per Dr. [place] Crane  Jalexa Pifer S. RoAlford HighlandPharmD, BCPS Clinical Staff Pharmacist [email or web address] RoAlford HighlandCrWellford0/12/2020,1:03 PM

## 2021-04-11 NOTE — Progress Notes (Signed)
Occupational Therapy Treatment Patient Details Name: Jerry [name] MRN: [id number] DOB: [birth year] Today's Date: 04/11/2021   History of present illness 70 y.o. male admitted on 04/01/21 for abdominal pain, jaundice, and dizziness.  Pt s/p ERCP on 9/28.  Pt dx with hyperbilirubinemia, non-alcoholic cirrhosis, 33m R liver lesion, coagulopathy s/p 2u FFP, dizziness, acute hepatic encephalopathy, hypoalbuminemia, severe protein calorie malnutrition. Complicated by lower GI bleed.  Pt with significant PMH of DM, HTN, portal vein thrombosis, CAD, HTN, PTSD, paracentesis, L reverse TSA.   OT comments  Pt is slowly progressing towards OT goals. During session, ambulated to toilet with RW, requiring supervision for hygiene and Min guard for functional transfers/mobility. Pt continues to report dizziness, however stated that it was improved today. Continues to be appropriate for SNF at d/c. Will continue to follow acutely.   Recommendations for follow up therapy are one component of a multi-disciplinary discharge planning process, led by the attending physician.  Recommendations may be updated based on patient status, additional functional criteria and insurance authorization.    Follow Up Recommendations  SNF;Supervision/Assistance - 24 hour    Equipment Recommendations  3 in 1 bedside commode    Recommendations for Other Services      Precautions / Restrictions Precautions Precautions: Fall Precaution Comments: chronic dizziness Restrictions Weight Bearing Restrictions: No       Mobility Bed Mobility Overal bed mobility: Needs Assistance Bed Mobility: Sidelying to Sit   Sidelying to sit: Supervision            Transfers                      Balance Overall balance assessment: Needs assistance Sitting-balance support: No upper extremity supported;Feet supported Sitting balance-Leahy Scale: Fair     Standing balance support: Bilateral upper extremity  supported Standing balance-Leahy Scale: Poor                             ADL either performed or assessed with clinical judgement   ADL Overall ADL's : Needs assistance/impaired                         Toilet Transfer: Min guard;Ambulation;Regular Toilet;RW   Toileting- Clothing Manipulation and Hygiene: Supervision/safety;Sitting/lateral lean;Sit to/from stand       Functional mobility during ADLs: MTherapist, art     Cognition Arousal/Alertness: Awake/alert Behavior During Therapy: Flat affect Overall Cognitive Status: Within Functional Limits for tasks assessed                                          Exercises     Shoulder Instructions       General Comments      Pertinent Vitals/ Pain       Pain Assessment: Faces Faces Pain Scale: Hurts a little bit Pain Location: back Pain Descriptors / Indicators: Grimacing;Guarding;Aching Pain Intervention(s): Monitored during session;Repositioned  Home Living                                          Prior Functioning/Environment  Frequency  Min 2X/week        Progress Toward Goals  OT Goals(current goals can now be found in the care plan section)  Progress towards OT goals: Progressing toward goals  Acute Rehab OT Goals Patient Stated Goal: improve functional ability OT Goal Formulation: With patient Time For Goal Achievement: 04/18/21 Potential to Achieve Goals: Good ADL Goals Pt Will Perform Grooming: with supervision;standing Pt Will Perform Lower Body Dressing: with supervision;sit to/from stand;with adaptive equipment Pt Will Transfer to Toilet: with supervision;ambulating;bedside commode Pt Will Perform Toileting - Clothing Manipulation and hygiene: with supervision;sit to/from stand;sitting/lateral leans  Plan Discharge plan remains appropriate;Frequency remains  appropriate    Co-evaluation                 AM-PAC OT "6 Clicks" Daily Activity     Outcome Measure   Help from another person eating meals?: None Help from another person taking care of personal grooming?: A Little Help from another person toileting, which includes using toliet, bedpan, or urinal?: A Little Help from another person bathing (including washing, rinsing, drying)?: A Lot Help from another person to put on and taking off regular upper body clothing?: A Little Help from another person to put on and taking off regular lower body clothing?: A Lot 6 Click Score: 17    End of Session Equipment Utilized During Treatment: Rolling walker;Gait belt  OT Visit Diagnosis: Other abnormalities of gait and mobility (R26.89);Muscle weakness (generalized) (M62.81);Pain;Dizziness and giddiness (R42)   Activity Tolerance Patient tolerated treatment well   Patient Left in chair;with call bell/phone within reach;with chair alarm set   Nurse Communication Mobility status        Time: 4585-9292 OT Time Calculation (min): 32 min  Charges: OT General Charges $OT Visit: 1 Visit OT Treatments $Self Care/Home Management : 23-37 mins  Saim Almanza C, OT/L  Acute Rehab [place] 04/11/2021, 4:28 PM

## 2021-04-11 NOTE — Progress Notes (Signed)
PROGRESS NOTE  Jerry [name] MEQ:[id number] DOB: 05-Jul-1951 DOA: 04/01/2021 PCP: Deberah Pelton, MD  HPI/Recap of past 58 hours: 70 year old male with history of nonalcoholic cirrhosis, PVD, ascites, renal cancer, CAD status post PCI, GI bleed admitted for abdominal pain, jaundice and dizziness.  Patient found to have transaminitis with elevated T bili.  CT abdomen pelvis with contrast (04/02/21) showed advanced cirrhosis with interval portal vein thrombosis, splenomegaly, choledocholithiasis, circumferential thickening of cecum and ascending colon reflecting portal colopathy.  GI team was consulted and patient underwent ERCP on 9/28 with performance of sphincterotomy and sweeping but no evidence of stone.  He was also noted to have acute kidney injury and due to concerns of HRS, nephrology team was consulted and was felt to be due to possibly contrast-induced nephropathy.  Eventually hospital course was complicated by GI bleed, started on PPI.  Okay from GI standpoint to restart Eliquis for portal vein thrombosis.  04/11/2021: Patient was seen and examined at his bedside.  He reports feeling fatigue, dizzy with worsening abdominal distention.  IR has been consulted for possible paracentesis.  States his last paracentesis was at the [facility] about a month ago, 2.6 L of fluid was removed at that time.  Assessment/Plan: Principal Problem:   Choledocholithiasis with obstruction Active Problems:   Portal vein thrombosis   Type 2 diabetes mellitus without complication, without long-term current use of insulin (HCC)   Essential hypertension   Liver cirrhosis secondary to NASH (HCC)   Transaminitis   Jaundice   Leukocytosis   GERD (gastroesophageal reflux disease)   Decompensated liver disease (HCC)   Hyponatremia   AKI (acute kidney injury) ([place])   Malnutrition of moderate degree  Generalized weakness/dizziness, multifactorial MRI brain shows chronic infarct Underlying advanced liver  disease Encourage increasing oral protein calorie intake Continue PT OT with assistance and fall precautions. Meclizine, vestibular therapy  Decompensated nonalcoholic liver cirrhosis, followed by GI bleed at the [facility] Liver chemistries are uptrending T bilirubin 13.1 from 12.3. Seen by GI, post ERCP on 9/28, enterotomy and sweeping without evidence of stone. Mental status at baseline  Portal vein thrombosis with peribiliary venous collateralization Okay to resume home Eliquis per GI  Abdominal distention possibly secondary to ascites from cirrhosis IR consulted for paracentesis Endorses last paracentesis was about a month ago with 2.6 L of fluid removed at the [facility] hospital.  Recent lower GI bleed Was found to have positive FOBT Was seen by GI with no plan for inpatient endoscopy. Currently on PPI twice daily.  History of renal cancer status post left partial nephrectomy Avoid nephrotoxic agents Monitor  Resolving AKI Baseline creatinine appears to be 1.1 with GFR greater than 60 Bump in creatinine peaked at 2.37 with GFR of 29 on 04/05/2021. Creatinine is downtrending, 1.29 with GFR 59. Continue to avoid nephrotoxic agents, dehydration and hypotension Monitor urine output  Mild hyperkalemia Serum potassium 5.4 Treated with 1 dose of Lokelma  Non anion gap metabolic acidosis Serum bicarb 19, anion gap of 7 Closely monitor  Hyperbilirubinemia/jaundice T bilirubin uptrending 13.1 from 12.3 Seen by GI, post ERCP. Repeat LFTs in the morning   Code Status: DNR  Family Communication: None at bedside  Disposition Plan: Plan to discharge to SNF   Consultants: GI  Procedures: ERCP  Antimicrobials: None  DVT prophylaxis: Eliquis  Status is: Inpatient    Dispo:  Patient From: Home  Planned Disposition: [place]  Medically stable for discharge: Yes  Objective: Vitals:   04/10/21 0700 04/10/21 1500 04/10/21 2059  04/11/21 0815  BP: 128/73 (!) 121/47 135/61 120/64  Pulse: 66 61 64 64  Resp: 18 17 17 16   Temp: 98.9 F (37.2 C) 98.8 F (37.1 C) 98.7 F (37.1 C) (!) 97.5 F (36.4 C)  TempSrc: Oral Oral Oral Oral  SpO2: 98% 99% 98% 95%  Weight:      Height:        Intake/Output Summary (Last 24 hours) at 04/11/2021 1213 Last data filed at 04/11/2021 0800 Gross per 24 hour  Intake 240 ml  Output 400 ml  Net -160 ml   Filed Weights   04/02/21 1200 04/02/21 1311 04/03/21 1246  Weight: 82.1 kg 82.1 kg 82.1 kg    Exam:  General: 70 y.o. year-old male well developed well nourished in no acute distress.  Alert and oriented x3. Cardiovascular: Regular rate and rhythm with no rubs or gallops.  No thyromegaly or JVD noted.   Respiratory: Clear to auscultation with no wheezes or rales. Good inspiratory effort. Abdomen: Distended, nontender, bowel sounds present. Musculoskeletal: No lower extremity edema. 2/4 pulses in all 4 extremities. Skin: No ulcerative lesions noted or rashes, Psychiatry: Mood is appropriate for condition and setting   Data Reviewed: CBC: Recent Labs  Lab 04/07/21 0222 04/08/21 0233 04/09/21 0123 04/10/21 0203 04/11/21 0534  WBC 7.2 8.5 8.9 9.6 9.6  HGB 10.1* 10.9* 10.3* 10.7* 11.1*  HCT 29.0* 31.8* 30.0* 31.3* 31.8*  MCV 92.7 94.4 94.6 95.4 93.8  PLT 143* 141* 148* 164 379*   Basic Metabolic Panel: Recent Labs  Lab 04/06/21 0250 04/07/21 0222 04/08/21 0233 04/09/21 0123 04/10/21 0203 04/11/21 0230  NA 133* 135 133* 135 130* 133*  K 4.9 4.8 5.1 5.2* 5.1 5.4*  CL 106 107 105 104 102 107  CO2 19* 20* 21* 23 21* 19*  GLUCOSE 252* 193* 183* 176* 218* 136*  BUN 51* 38* 30* 32* 34* 34*  CREATININE 1.63* 1.38* 1.22 1.39* 1.37* 1.29*  CALCIUM 8.8* 9.1 8.8* 8.7* 8.5* 8.7*  MG  --   --   --  1.8 2.0 1.8  PHOS 2.6  --   --   --   --   --    GFR: Estimated Creatinine Clearance: 55.9 mL/min (A) (by C-G formula based on SCr of 1.29 mg/dL (H)). Liver Function  Tests: Recent Labs  Lab 04/07/21 0222 04/08/21 0233 04/09/21 0123 04/10/21 0203 04/11/21 0230  AST 173* 172* 155* 156* 168*  ALT 114* 122* 114* 115* 114*  ALKPHOS 176* 185* 192* 193* 204*  BILITOT 9.5* 10.4* 11.1* 12.3* 13.1*  PROT 6.2* 6.4* 6.2* 6.3* 6.1*  ALBUMIN 2.3* 2.2* 2.0* 2.0* 1.9*   No results for input(s): LIPASE, AMYLASE in the last 168 hours. Recent Labs  Lab 04/06/21 1324 04/08/21 0233  AMMONIA 113* 75*   Coagulation Profile: Recent Labs  Lab 04/05/21 0324 04/06/21 0250 04/07/21 0222 04/08/21 0233  INR 1.9* 1.9* 1.6* 1.5*   Cardiac Enzymes: No results for input(s): CKTOTAL, CKMB, CKMBINDEX, TROPONINI in the last 168 hours. BNP (last 3 results) No results for input(s): PROBNP in the last 8760 hours. HbA1C: No results for input(s): HGBA1C in the last 72 hours. CBG: Recent Labs  Lab 04/10/21 1157 04/10/21 1714 04/10/21 2106 04/11/21 0641 04/11/21 1120  GLUCAP 191* 216* 238* 133* 222*   Lipid Profile: No results for input(s): CHOL, HDL, LDLCALC, TRIG, CHOLHDL, LDLDIRECT in the last 72 hours. Thyroid Function Tests: No results for input(s):  TSH, T4TOTAL, FREET4, T3FREE, THYROIDAB in the last 72 hours. Anemia Panel: No results for input(s): VITAMINB12, FOLATE, FERRITIN, TIBC, IRON, RETICCTPCT in the last 72 hours. Urine analysis:    Component Value Date/Time   COLORURINE AMBER (A) 04/02/2021 0546   APPEARANCEUR CLEAR 04/02/2021 0546   LABSPEC 1.036 (H) 04/02/2021 0546   PHURINE 5.0 04/02/2021 0546   GLUCOSEU NEGATIVE 04/02/2021 0546   HGBUR NEGATIVE 04/02/2021 0546   BILIRUBINUR SMALL (A) 04/02/2021 0546   KETONESUR NEGATIVE 04/02/2021 0546   PROTEINUR NEGATIVE 04/02/2021 0546   NITRITE NEGATIVE 04/02/2021 0546   LEUKOCYTESUR NEGATIVE 04/02/2021 0546   Sepsis Labs: @LABRCNTIP (procalcitonin:4,lacticidven:4)  ) Recent Results (from the past 240 hour(s))  Urine Culture     Status: Abnormal   Collection Time: 04/02/21  5:46 AM   Specimen:  Urine, Clean Catch  Result Value Ref Range Status   Specimen Description URINE, CLEAN CATCH  Final   Special Requests   Final    NONE Performed at [facility], [place] [address], [place], [place] [address]    Culture MULTIPLE SPECIES PRESENT, SUGGEST RECOLLECTION (A)  Final   Report Status 04/02/2021 FINAL  Final  Resp Panel by RT-PCR (Flu A&B, Covid) Nasopharyngeal Swab     Status: None   Collection Time: 04/02/21  5:46 AM   Specimen: Nasopharyngeal Swab; Nasopharyngeal(NP) swabs in vial transport medium  Result Value Ref Range Status   SARS Coronavirus 2 by RT PCR NEGATIVE NEGATIVE Final    Comment: (NOTE) SARS-CoV-2 target nucleic acids are NOT DETECTED.  The SARS-CoV-2 RNA is generally detectable in upper respiratory specimens during the acute phase of infection. The lowest concentration of SARS-CoV-2 viral copies this assay can detect is 138 copies/mL. A negative result does not preclude SARS-Cov-2 infection and should not be used as the sole basis for treatment or other patient management decisions. A negative result may occur with  improper specimen collection/handling, submission of specimen other than nasopharyngeal swab, presence of viral mutation(s) within the areas targeted by this assay, and inadequate number of viral copies(<138 copies/mL). A negative result must be combined with clinical observations, patient history, and epidemiological information. The expected result is Negative.  Fact Sheet for Patients:  EntrepreneurPulse.com.au  Fact Sheet for Healthcare Providers:  IncredibleEmployment.be  This test is no t yet approved or cleared by the [place] FDA and  has been authorized for detection and/or diagnosis of SARS-CoV-2 by FDA under an Emergency Use Authorization (EUA). This EUA will remain  in effect (meaning this test can be used) for the duration of the COVID-19 declaration under Section 564(b)(1) of the Act,  21 U.S.C.section 360bbb-3(b)(1), unless the authorization is terminated  or revoked sooner.       Influenza A by PCR NEGATIVE NEGATIVE Final   Influenza B by PCR NEGATIVE NEGATIVE Final    Comment: (NOTE) The Xpert Xpress SARS-CoV-2/FLU/RSV plus assay is intended as an aid in the diagnosis of influenza from Nasopharyngeal swab specimens and should not be used as a sole basis for treatment. Nasal washings and aspirates are unacceptable for Xpert Xpress SARS-CoV-2/FLU/RSV testing.  Fact Sheet for Patients: EntrepreneurPulse.com.au  Fact Sheet for Healthcare Providers: IncredibleEmployment.be  This test is not yet approved or cleared by the [place] FDA and has been authorized for detection and/or diagnosis of SARS-CoV-2 by FDA under an Emergency Use Authorization (EUA). This EUA will remain in effect (meaning this test can be used) for the duration of the COVID-19 declaration under Section 564(b)(1) of the  Act, 21 U.S.C. section 360bbb-3(b)(1), unless the authorization is terminated or revoked.  Performed at [facility], [place] [address] [place], [place] [address]   Surgical pcr screen     Status: None   Collection Time: 04/03/21  8:37 AM   Specimen: Nasal Mucosa; Nasal Swab  Result Value Ref Range Status   MRSA, PCR NEGATIVE NEGATIVE Final   Staphylococcus aureus NEGATIVE NEGATIVE Final    Comment: (NOTE) The Xpert SA Assay (FDA approved for NASAL specimens in patients 29 years of age and older), is one component of a comprehensive surveillance program. It is not intended to diagnose infection nor to guide or monitor treatment. Performed at [facility], [place] [address] [place], [place] [address]   Resp Panel by RT-PCR (Flu A&B, Covid) Nasopharyngeal Swab     Status: None   Collection Time: 04/10/21 12:46 AM   Specimen: Nasopharyngeal Swab; Nasopharyngeal(NP) swabs in vial transport medium  Result Value Ref Range  Status   SARS Coronavirus 2 by RT PCR NEGATIVE NEGATIVE Final    Comment: (NOTE) SARS-CoV-2 target nucleic acids are NOT DETECTED.  The SARS-CoV-2 RNA is generally detectable in upper respiratory specimens during the acute phase of infection. The lowest concentration of SARS-CoV-2 viral copies this assay can detect is 138 copies/mL. A negative result does not preclude SARS-Cov-2 infection and should not be used as the sole basis for treatment or other patient management decisions. A negative result may occur with  improper specimen collection/handling, submission of specimen other than nasopharyngeal swab, presence of viral mutation(s) within the areas targeted by this assay, and inadequate number of viral copies(<138 copies/mL). A negative result must be combined with clinical observations, patient history, and epidemiological information. The expected result is Negative.  Fact Sheet for Patients:  EntrepreneurPulse.com.au  Fact Sheet for Healthcare Providers:  IncredibleEmployment.be  This test is no t yet approved or cleared by the [place] FDA and  has been authorized for detection and/or diagnosis of SARS-CoV-2 by FDA under an Emergency Use Authorization (EUA). This EUA will remain  in effect (meaning this test can be used) for the duration of the COVID-19 declaration under Section 564(b)(1) of the Act, 21 U.S.C.section 360bbb-3(b)(1), unless the authorization is terminated  or revoked sooner.       Influenza A by PCR NEGATIVE NEGATIVE Final   Influenza B by PCR NEGATIVE NEGATIVE Final    Comment: (NOTE) The Xpert Xpress SARS-CoV-2/FLU/RSV plus assay is intended as an aid in the diagnosis of influenza from Nasopharyngeal swab specimens and should not be used as a sole basis for treatment. Nasal washings and aspirates are unacceptable for Xpert Xpress SARS-CoV-2/FLU/RSV testing.  Fact Sheet for  Patients: EntrepreneurPulse.com.au  Fact Sheet for Healthcare Providers: IncredibleEmployment.be  This test is not yet approved or cleared by the [place] FDA and has been authorized for detection and/or diagnosis of SARS-CoV-2 by FDA under an Emergency Use Authorization (EUA). This EUA will remain in effect (meaning this test can be used) for the duration of the COVID-19 declaration under Section 564(b)(1) of the Act, 21 U.S.C. section 360bbb-3(b)(1), unless the authorization is terminated or revoked.  Performed at [facility], [place] [address], [place], [place] [address]       Studies: No results found.  Scheduled Meds:  camphor-menthol   Topical BID   carvedilol  3.125 mg Oral BID   cetirizine  5 mg Oral Daily   cholestyramine light  4 g Oral q12n4p  feeding supplement  237 mL Oral TID BM   insulin aspart  0-20 Units Subcutaneous TID WC   insulin aspart  0-5 Units Subcutaneous QHS   isosorbide mononitrate  90 mg Oral q morning   lactulose  20 g Oral BID   meclizine  25 mg Oral TID   multivitamin with minerals  1 tablet Oral Daily   pantoprazole  40 mg Oral Daily   sodium chloride flush  3 mL Intravenous Q12H    Continuous Infusions:   LOS: 9 days     Kayleen Memos, MD [facility] Pager [phone]  If 7PM-7AM, please contact night-coverage [email or web address] Password [email or web address] 04/11/2021, 12:13 PM

## 2021-04-11 NOTE — Plan of Care (Signed)
  Problem: Education: Goal: Knowledge of General Education information will improve Description: Including pain rating scale, medication(s)/side effects and non-pharmacologic comfort measures Outcome: Progressing   Problem: Clinical Measurements: Goal: Ability to maintain clinical measurements within normal limits will improve Outcome: Progressing Goal: Will remain free from infection Outcome: Progressing Goal: Diagnostic test results will improve Outcome: Progressing   Problem: Nutrition: Goal: Adequate nutrition will be maintained Outcome: Progressing   Problem: Elimination: Goal: Will not experience complications related to bowel motility Outcome: Progressing Goal: Will not experience complications related to urinary retention Outcome: Progressing   Problem: Pain Managment: Goal: General experience of comfort will improve Outcome: Progressing   Problem: Safety: Goal: Ability to remain free from injury will improve Outcome: Progressing

## 2021-04-12 ENCOUNTER — Other Ambulatory Visit (HOSPITAL_COMMUNITY): Payer: Self-pay

## 2021-04-12 DIAGNOSIS — Z515 Encounter for palliative care: Secondary | ICD-10-CM

## 2021-04-12 DIAGNOSIS — Z7189 Other specified counseling: Secondary | ICD-10-CM

## 2021-04-12 DIAGNOSIS — R188 Other ascites: Secondary | ICD-10-CM

## 2021-04-12 DIAGNOSIS — Z66 Do not resuscitate: Secondary | ICD-10-CM

## 2021-04-12 LAB — GLUCOSE, CAPILLARY
Glucose-Capillary: 117 mg/dL — ABNORMAL HIGH (ref 70–99)
Glucose-Capillary: 129 mg/dL — ABNORMAL HIGH (ref 70–99)
Glucose-Capillary: 216 mg/dL — ABNORMAL HIGH (ref 70–99)
Glucose-Capillary: 204 mg/dL — ABNORMAL HIGH (ref 70–99)

## 2021-04-12 LAB — COMPREHENSIVE METABOLIC PANEL
ALT: 101 U/L — ABNORMAL HIGH (ref 0–44)
AST: 151 U/L — ABNORMAL HIGH (ref 15–41)
Albumin: 2.2 g/dL — ABNORMAL LOW (ref 3.5–5.0)
Alkaline Phosphatase: 177 U/L — ABNORMAL HIGH (ref 38–126)
Anion gap: 6 (ref 5–15)
BUN: 44 mg/dL — ABNORMAL HIGH (ref 8–23)
CO2: 21 mmol/L — ABNORMAL LOW (ref 22–32)
Calcium: 8.4 mg/dL — ABNORMAL LOW (ref 8.9–10.3)
Chloride: 104 mmol/L (ref 98–111)
Creatinine, Ser: 1.58 mg/dL — ABNORMAL HIGH (ref 0.61–1.24)
GFR, Estimated: 46 mL/min — ABNORMAL LOW (ref >60)
Glucose, Bld: 176 mg/dL — ABNORMAL HIGH (ref 70–99)
Potassium: 5.4 mmol/L — ABNORMAL HIGH (ref 3.5–5.1)
Sodium: 131 mmol/L — ABNORMAL LOW (ref 135–145)
Total Bilirubin: 13.3 mg/dL — ABNORMAL HIGH (ref 0.3–1.2)
Total Protein: 6 g/dL — ABNORMAL LOW (ref 6.5–8.1)

## 2021-04-12 LAB — CBC
HCT: 28.2 % — ABNORMAL LOW (ref 39.0–52.0)
Hemoglobin: 9.8 g/dL — ABNORMAL LOW (ref 13.0–17.0)
MCH: 33.3 pg (ref 26.0–34.0)
MCHC: 34.8 g/dL (ref 30.0–36.0)
MCV: 95.9 fL (ref 80.0–100.0)
Platelets: 131 10*3/uL — ABNORMAL LOW (ref 150–400)
RBC: 2.94 MIL/uL — ABNORMAL LOW (ref 4.22–5.81)
RDW: 19.3 % — ABNORMAL HIGH (ref 11.5–15.5)
WBC: 9 10*3/uL (ref 4.0–10.5)
nRBC: 0 % (ref 0.0–0.2)

## 2021-04-12 LAB — PHOSPHORUS: Phosphorus: 3.4 mg/dL (ref 2.5–4.6)

## 2021-04-12 LAB — PH, BODY FLUID: pH, Body Fluid: 7.6

## 2021-04-12 LAB — MAGNESIUM: Magnesium: 2 mg/dL (ref 1.7–2.4)

## 2021-04-12 MED ORDER — HYDROXYZINE HCL 25 MG PO TABS: 25.0000 mg | ORAL_TABLET | Freq: Three times a day (TID) | ORAL | 0 refills | Status: AC | PRN

## 2021-04-12 MED ORDER — RIFAXIMIN 550 MG PO TABS
550.0000 mg | ORAL_TABLET | Freq: Two times a day (BID) | ORAL | Status: DC
  Administered 2021-04-12: 550 mg via ORAL
  Filled 2021-04-12 (×2): qty 1

## 2021-04-12 MED ORDER — GLIPIZIDE 10 MG PO TABS: 10.0000 mg | ORAL_TABLET | Freq: Every day | ORAL | 0 refills | Status: DC

## 2021-04-12 MED ORDER — SODIUM ZIRCONIUM CYCLOSILICATE 10 G PO PACK
10.0000 g | PACK | Freq: Three times a day (TID) | ORAL | Status: DC
  Administered 2021-04-12 (×2): 10 g via ORAL
  Filled 2021-04-12 (×3): qty 1

## 2021-04-12 MED ORDER — RIFAXIMIN 550 MG PO TABS: 550.0000 mg | ORAL_TABLET | Freq: Two times a day (BID) | ORAL | 0 refills | Status: DC

## 2021-04-12 MED ORDER — FUROSEMIDE 20 MG PO TABS
20.0000 mg | ORAL_TABLET | Freq: Every day | ORAL | Status: DC
  Administered 2021-04-12: 20 mg via ORAL
  Filled 2021-04-12: qty 1

## 2021-04-12 MED ORDER — CAMPHOR-MENTHOL 0.5-0.5 % EX LOTN: TOPICAL_LOTION | Freq: Two times a day (BID) | CUTANEOUS | 0 refills | Status: DC

## 2021-04-12 MED ORDER — FUROSEMIDE 20 MG PO TABS
20.0000 mg | ORAL_TABLET | Freq: Every day | ORAL | 0 refills | Status: DC
Start: 2021-04-12 — End: 2021-04-12

## 2021-04-12 MED ORDER — APIXABAN 5 MG PO TABS: 5.0000 mg | ORAL_TABLET | Freq: Two times a day (BID) | ORAL | 0 refills | Status: DC

## 2021-04-12 MED ORDER — CAMPHOR-MENTHOL 0.5-0.5 % EX LOTN: TOPICAL_LOTION | Freq: Two times a day (BID) | CUTANEOUS | 0 refills | Status: AC

## 2021-04-12 MED ORDER — CHOLESTYRAMINE LIGHT 4 G PO PACK: 4.0000 g | PACK | Freq: Two times a day (BID) | ORAL | 0 refills | Status: AC

## 2021-04-12 MED ORDER — APIXABAN 5 MG PO TABS
5.0000 mg | ORAL_TABLET | Freq: Two times a day (BID) | ORAL | 0 refills | Status: DC
Start: 2021-04-12 — End: 2021-05-03

## 2021-04-12 MED ORDER — MECLIZINE HCL 25 MG PO TABS: 25.0000 mg | ORAL_TABLET | Freq: Three times a day (TID) | ORAL | 0 refills | Status: AC

## 2021-04-12 MED ORDER — LACTULOSE 10 GM/15ML PO SOLN: 20.0000 g | Freq: Three times a day (TID) | ORAL | 0 refills | Status: AC

## 2021-04-12 MED ORDER — FUROSEMIDE 20 MG PO TABS
20.0000 mg | ORAL_TABLET | Freq: Every day | ORAL | 0 refills | Status: DC
Start: 2021-04-12 — End: 2021-05-03

## 2021-04-12 NOTE — Progress Notes (Signed)
Physical Therapy Treatment Patient Details Name: Jerry [name] MRN: [id number] DOB: [birth year] Today's Date: 04/12/2021   History of Present Illness 70 y.o. male admitted on 04/01/21 for abdominal pain, jaundice, and dizziness.  Pt s/p ERCP on 9/28.  Pt dx with hyperbilirubinemia, non-alcoholic cirrhosis, 22m R liver lesion, coagulopathy s/p 2u FFP, dizziness, acute hepatic encephalopathy, hypoalbuminemia, severe protein calorie malnutrition. Complicated by lower GI bleed. s/p paracentesis 04/11/21. Pt with significant PMH of DM, HTN, portal vein thrombosis, CAD, HTN, PTSD, paracentesis, L reverse TSA.    PT Comments    Patient progressing slowly towards PT goals. Reports feeling well today. Continues to have dizziness with standing and walking putting pt at increased risk for falls. Tolerated short distance ambulation using rollator and Min A for safety. Worked on targeting and compensatory strategies during ambulation. Provided instruction for rollator brakes prior to standing/sitting for safety. Limited due to dizziness and weakness. Pt left sitting in hallway with family, RN aware, to look out the window. Will follow.   Recommendations for follow up therapy are one component of a multi-disciplinary discharge planning process, led by the attending physician.  Recommendations may be updated based on patient status, additional functional criteria and insurance authorization.  Follow Up Recommendations  SNF     Equipment Recommendations  Other (comment) (4 wheeled walker)    Recommendations for Other Services       Precautions / Restrictions Precautions Precautions: Fall;Other (comment) Precaution Comments: chronic dizziness Restrictions Weight Bearing Restrictions: No     Mobility  Bed Mobility Overal bed mobility: Needs Assistance Bed Mobility: Rolling;Sidelying to Sit Rolling: Min guard Sidelying to sit: Min assist;HOB elevated       General bed mobility comments: Cues to  reach for rail and elevate trunk tog et to EOB.    Transfers Overall transfer level: Needs assistance Equipment used: 4-wheeled walker Transfers: Sit to/from Stand Sit to Stand: Min assist         General transfer comment: Assist to power to standing with cues for hand placement/technique and locking rollator brakes. Slow transition with dizziness once upright. Assist to turn and sit on rollator seat with cues for eyes/head/body to minimize dizzy symptoms.  Ambulation/Gait Ambulation/Gait assistance: Min assist Gait Distance (Feet): 15 Feet Assistive device: 4-wheeled walker Gait Pattern/deviations: Step-through pattern;Decreased stride length;Shuffle Gait velocity: decreased   General Gait Details: Very very slow gait with flexed posture and deliberate steps, dizziness present throughout worsened at times. Practiced targeting and compensatory methods.   Stairs             Wheelchair Mobility    Modified Rankin (Stroke Patients Only)       Balance Overall balance assessment: Needs assistance Sitting-balance support: Feet supported;No upper extremity supported Sitting balance-Leahy Scale: Fair     Standing balance support: During functional activity Standing balance-Leahy Scale: Poor Standing balance comment: heavily reliant on Bil UE support in standing.                            Cognition Arousal/Alertness: Awake/alert Behavior During Therapy: WFL for tasks assessed/performed Overall Cognitive Status: Within Functional Limits for tasks assessed                                 General Comments: Seems in good spirits today with family present.      Exercises Other Exercises Other Exercises: worked on hamstring activation  with pt pulling self with feet while sitting in rollator seat    General Comments General comments (skin integrity, edema, etc.): Family present during session.      Pertinent Vitals/Pain Pain Assessment:  Faces Faces Pain Scale: Hurts a little bit Pain Location: abdomen at hernia site Pain Descriptors / Indicators: Grimacing Pain Intervention(s): Monitored during session;Repositioned    Home Living                      Prior Function            PT Goals (current goals can now be found in the care plan section) Progress towards PT goals: Progressing toward goals (slowly)    Frequency    Min 2X/week      PT Plan Current plan remains appropriate    Co-evaluation              AM-PAC PT "6 Clicks" Mobility   Outcome Measure  Help needed turning from your back to your side while in a flat bed without using bedrails?: A Little Help needed moving from lying on your back to sitting on the side of a flat bed without using bedrails?: A Little Help needed moving to and from a bed to a chair (including a wheelchair)?: A Little Help needed standing up from a chair using your arms (e.g., wheelchair or bedside chair)?: A Little Help needed to walk in hospital room?: A Little Help needed climbing 3-5 steps with a railing? : A Lot 6 Click Score: 17    End of Session Equipment Utilized During Treatment: Gait belt Activity Tolerance: Patient limited by fatigue Patient left: Other (comment) (left sitting on rollator with family in hallway looking out window, RN aware) Nurse Communication: Mobility status;Other (comment) (pt in hallway wih family) PT Visit Diagnosis: Muscle weakness (generalized) (M62.81);Dizziness and giddiness (R42);Difficulty in walking, not elsewhere classified (R26.2)     Time: 1131-1206 PT Time Calculation (min) (ACUTE ONLY): 35 min  Charges:  $Gait Training: 23-37 mins                     Marisa Severin, PT, DPT Acute Rehabilitation Services Pager [phone] Office [place] 04/12/2021, 12:56 PM

## 2021-04-12 NOTE — Consult Note (Signed)
Palliative Medicine Inpatient Consult Note  Consulting Provider: Kayleen Memos, DO  Reason for consult:   [facility] Palliative Medicine Consult  Reason for Consult? To assist with establishing goals of care    HPI:  Per intake H&P --> 70 year old male with history of nonalcoholic cirrhosis, PVD, ascites, renal cancer, CAD status post PCI, GI bleed admitted for abdominal pain, jaundice and dizziness.  Patient found to have transaminitis with elevated T bili.  CT abdomen pelvis with contrast (04/02/21) showed advanced cirrhosis with interval portal vein thrombosis, splenomegaly, choledocholithiasis, circumferential thickening of cecum and ascending colon reflecting portal colopathy.  GI team was consulted and patient underwent ERCP on 9/28 with performance of sphincterotomy and sweeping but no evidence of stone.  He was also noted to have acute kidney injury and due to concerns of HRS, nephrology team was consulted and was felt to be due to possibly contrast-induced nephropathy.  Eventually hospital course was complicated by GI bleed, started on PPI.  Okay from GI standpoint to restart Eliquis for portal vein thrombosis.  Palliative care was asked to get involved to discuss goals of care given multiple co-morbid conditions.   Clinical Assessment/Goals of Care:  *Please note that this is a verbal dictation therefore any spelling or grammatical errors are due to the "[employer]" system interpretation.  I have reviewed medical records including EPIC notes, labs and imaging, received report from bedside RN, assessed the patient, who was resting comfortably in bed in no visible distress. .    I met with [name] to further discuss diagnosis prognosis, GOC, EOL wishes, disposition and options.  A brief review of Jerry [name]'s past medical history was held inclusive of his history of renal cancer, peripheral vascular disease, Jerry [name] cirrhosis, coronary artery  disease.  I asked Jerry [name] what he understands about his admission to the hospital and he shares that he came in because his abdomen was uncomfortable.  We reviewed that he has cirrhosis which is at its end stages and there is nothing additional that can be done from an interventional perspective to repair this.  We reviewed the importance of comprehensive medication management.   I introduced Palliative Medicine as specialized medical care for people living with serious illness. It focuses on providing relief from the symptoms and stress of a serious illness. The goal is to improve quality of life for both the patient and the family.  Jerry [name] shares with me that he is from [facility].  He is divorced.  He has had a significant other, Jerry [name] for the past 15 months.  He has 2 children a son who lives in [place] and a daughter who lives in [place].  While he is close with his son he is estranged from his daughter and has been for the past 22 years.  Jerry [name] formally worked in the [employer].  He is a man who enjoys reading.  He does not consider himself overtly faithful.  Prior to hospitalization Jerry [name] was living in a single-family home in [place]. He was able to complete all of his basic activities and instrumental activities of daily living unassisted.  He was still driving with a car.  He was not using any mobility aids though he shares he probably should have been using a cane.  A detailed discussion was had today regarding advanced directives, Jerry [name] shares that he has completed these.  Per review of the electronic medical records they are active on [employer]..  Concepts specific to code status,  artifical feeding and hydration, continued IV antibiotics and rehospitalization was had.  Patient is DO NOT RESUSCITATE, DO NOT INTUBATE CODE STATUS.    The difference between a aggressive medical intervention path  and a palliative comfort care path for this patient at this time  was had.  I gently broach the topic of hospice care.  I described hospice as a service for patients for have a life expectancy of < 6 months. It preserves dignity and quality at the end phases of life. The focus changes from curative to symptom relief.  I shared that given the reality that Jerry [name]'s disease is incurable consideration of hospice is a worthwhile one.  Discussed the importance of continued conversation with family and their  medical providers regarding overall plan of care and treatment options, ensuring decisions are within the context of the patients values and GOCs.  ____________________________________ Addendum:  I spoke to patient's significant other Jerry [name], we reviewed Hisao's medical conditions.  She shared with me that she was present yesterday when the "young doctor" came into the room and shared with [name] that he is not a candidate for any advanced therapies or surgical procedures to correct his end-stage liver disease.  I asked Jerry [name] if she understands what that means and she expresses that this means he is not going to be cured.  She shares with me that she has seen him decline over the past month fairly significantly and she is quite surprised that nobody at the [place] picked up on the obvious signs of his disease process.  Jerry [name] shares that she is not surprised that we have gotten to a point where hospice discussions have started.  I asked her if she was going to be in later today so that we may all talk collectively and she shares she will be present at 2 PM.  We have agreed to meet then.  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   Discharge Planning: Discharge is presently unclear.  Vitals:   04/11/21 1518 04/11/21 2139  BP: 127/60 (!) 111/49  Pulse: 72 67  Resp: 15 16  Temp: 98.4 F (36.9 C) 98.2 F (36.8 C)  SpO2: 96% 99%    Intake/Output Summary (Last 24 hours) at 04/12/2021 0647 Last data filed at 04/12/2021 0174 Gross per 24 hour  Intake 480 ml  Output 1700 ml  Net -1220 ml   Last Weight  Most recent update: 04/03/2021 12:48 PM    Weight  82.1 kg (181 lb)            Gen:  Elderly M in NAD HEENT: moist mucous membranes CV: Regular rate and rhythm  PULM: clear to auscultation bilaterally  ABD: Distended EXT: No edema  Neuro: Alert and oriented x2  PPS: 50%   This conversation/these recommendations were discussed with patient primary care team, Dr. Nevada Crane  Time In: 0650 Time Out: 0800 Total Time: 70 Greater than 50%  of this time was spent counseling and coordinating care related to the above  assessment and plan.  [place] Team Team Cell Phone: [phone] Please utilize secure chat with additional questions, if there is no response within 30 minutes please call the above phone number  Palliative Medicine Team providers are available by phone from 7am to 7pm daily and can be reached through the team cell phone.  Should this patient require assistance outside of these hours, please call the patient's attending physician.

## 2021-04-12 NOTE — Progress Notes (Signed)
Patient was discharged to [name] with patient's belongings via [place]. Pt in no apparent distress.

## 2021-04-12 NOTE — Progress Notes (Signed)
Informed that body fluid culture returned positive for gram positive cocci in clusters, likely a contaminant as body fluid PMN count clearly less than 250 cells/microL. Afebrile with no leukocytosis.  No abdominal pain.  Discussed with GI Dr. Candis Schatz who agrees.

## 2021-04-12 NOTE — Discharge Summary (Addendum)
Physician Discharge Summary  Jerry [name] XBL:[id number] DOB: 11/11/1950 DOA: 04/01/2021  PCP: Jerry Pelton, MD  Admit date: 04/01/2021 Discharge date: 04/12/2021  Admitted From: Home Disposition: SNF  Recommendations for Outpatient Follow-up:  Follow up with PCP in 1-2 weeks Please obtain CMP/CBC in one week your next doctors visit.  Restart Lasix as recommended by GI. Cholestyramine initiated for severe pruritus which has helped.  This should be given 1 hour after other medications Continue lactulose Meclizine as needed for dizziness Statin has been stopped due to elevated LFTs Eliquis restarted due to portal vein thrombosis  PPI BID Patient will need repeat MRI in 3-6 months for hepatic lesion. Follow-up with palliative care team outpatient  Discharge Condition: Stable CODE STATUS: DNR Diet recommendation: Less than 2 g salt per day.  Brief/Interim Summary: [age]-year-old with history of nonalcoholic cirrhosis, PVD, ascites, renal cancer, CAD status post PCI, GI bleed admitted for abdominal pain, jaundice and dizziness.  Patient found to have transaminitis with elevated bili.  CT abdomen pelvis showed advanced cirrhosis with interval portal vein thrombosis, splenomegaly, choledocholithiasis, circumferential thickening of cecum and ascending colon reflecting portal colopathy.  GI team was consulted and patient underwent ERCP on 9/28 with performance of sphincterotomy and sweeping but no evidence of stone.  He was also noted to have acute kidney injury and due to concerns of HRS, nephrology team was consulted and was felt to be due to possibly contrast-induced nephropathy.  Eventually hospital course was complicated by GI bleed, started on PPI.  Abdominal distention with ascites for which paracentesis was completed with 1.2 L of fluid removed by IR.  Patient is cleared by nephrology and GI for discharge.  Okay from GI standpoint to restart Eliquis for portal vein  thrombosis.   04/12/2021: Patient was seen and examined at his bedside.  He states he feels better after his paracentesis.     Assessment & Plan:   Principal Problem:   Choledocholithiasis with obstruction Active Problems:   Portal vein thrombosis   Type 2 diabetes mellitus without complication, without long-term current use of insulin (HCC)   Essential hypertension   Liver cirrhosis secondary to NASH (HCC)   Transaminitis   Jaundice   Leukocytosis   GERD (gastroesophageal reflux disease)   Decompensated liver disease (HCC)   Hyponatremia   AKI (acute kidney injury) ([place])   Malnutrition of moderate degree   Generalized weakness/dizziness, multifactorial MRI brain shows chronic infarct Underlying advanced liver disease Encourage increase in protein calorie intake Continue PT OT with assistance and fall precautions. Meclizine, vestibular therapy   Decompensated nonalcoholic liver cirrhosis, followed by GI at the [facility] T bilirubin 13.3 from 14.8 Seen by GI, post ERCP on 9/28, enterotomy and sweeping without evidence of stone. Mental status at baseline. 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 Restarted p.o. Lasix at [facility] recommendations Salt restriction less than 2 g/day.   Presumptive lower GI bleed Was found to have positive FOBT Patient has portal hypertensive gastropathy which would likely cause a positive FOBT, per GI. Was seen by GI with no plan for inpatient endoscopy. Currently on PPI twice daily. CBC in 1 week 04/19/21 at PCP's office  History of renal cancer status post left partial nephrectomy Avoid nephrotoxic agents    AKI likely contributed by end-stage liver disease. Baseline creatinine appears to be 1.1 with GFR greater than 60 Creatinine peaked at 2.37 with GFR of 29 on 04/05/2021. Creatinine 1.58 from 1.52 from 1.29 Continue to avoid nephrotoxic agents and hypotension CMP in 1 week   Hyperkalemia Serum potassium 5.4 Treated with Lokelma Restarted p.o. Lasix.   Non anion gap metabolic acidosis Serum bicarb 21, anion gap of 6 Closely monitor   Hyperbilirubinemia/jaundice T bilirubin 13.3 from 14.8 Seen by GI, post ERCP. Follow-up with your gastroenterologist.  Resolved acute hepatic encephalopathy - Continue lactulose and rifaximin as recommended by GI  History of renal cancer status post left partial nephrectomy - Suspect contrast-induced nephropathy.   - Cleared by nephrology for discharge - Renal ultrasound-no acute findings   44m right liver lesion: Seen on CT (report below):  -Will need outpatient MRI in 3 months for follow-up   Dizziness -Unclear etiology, possibly underlying disease.  Vestibular therapy ongoing, meclizine 25 mg 3 times daily - MRI brain-shows chronic infarct.   Resolved Leukocytosis: Resolved-Afebrile   Diabetes mellitus type 2, - A1c 8.8.  Holding metformin Continue glipizide at lower dose 10 mg daily. Avoid hypoglycemia Follow up with your PCP   Essential hypertension BP is at goal -On Coreg, isosorbide mononitrate   Hyperlipidemia Continue to hold off statin due to transaminitis.    Moderate protein calorie malnutrition: Prealbumin 5.7. Severe progressive liver disease contributing.  Increase protein calorie intake.  UPDATE: Informed that body fluid culture returned positive for gram positive cocci in clusters, likely a contaminant as body fluid PMN count clearly less than 250 cells/microL. Afebrile with no leukocytosis.  No abdominal pain.  Discussed with GI Dr. CCandis Schatzwho agrees.     Code Status: DNR   Family Communication: Updated his  son and other family members at bedside 04/11/2021.   Disposition Plan: Plan to discharge to SNF     Consultants: GI Palliative care team.   Procedures: ERCP   Antimicrobials: None   DVT prophylaxis: Eliquis       Body mass index is 25.24 kg/m.     Discharge Diagnoses:  Principal Problem:   Choledocholithiasis with obstruction Active Problems:   Portal vein thrombosis   Type 2 diabetes mellitus without complication, without long-term current use of insulin (HCC)   Essential hypertension   Liver cirrhosis secondary to NASH (HCC)   Transaminitis   Jaundice   Leukocytosis   GERD (gastroesophageal reflux disease)   Decompensated liver disease (HCC)   Hyponatremia   AKI (acute kidney injury) ([place]   Malnutrition of moderate degree    Discharge Exam: Vitals:   04/12/21 1135 04/12/21 1429  BP: 120/60 117/66  Pulse: 63 61  Resp:  17  Temp:  97.6 F (36.4 C)  SpO2:  98%   Vitals:   04/11/21 2139 04/12/21 0852 04/12/21 1135 04/12/21 1429  BP: (!) 111/49 (!) 118/58 120/60 117/66  Pulse: 67 66 63 61  Resp: 16 (!) 22  17  Temp: 98.2 F (36.8 C) 98.1 F (36.7 C)  97.6 F (36.4 C)  TempSrc: Oral Oral    SpO2: 99% 98%  98%  Weight:      Height:        General: Frail-appearing alert and oriented x3. Cardiovascular: Regular rate and rhythm no rubs or gallops. Respiratory: Clear to auscultation with no wheezes or rales. Abdominal: Mildly distended with bowel sounds present.  Nontender with palpation. Extremities: Trace edema lower extremities bilaterally.  No cyanosis. Skin: Jaundice noted, icteric sclera.  Discharge Instructions   Allergies as of 04/12/2021       Reactions   Atorvastatin Other (See Comments)   Unknown   Metoprolol Other (See Comments)   bradycardia   Penicillins Swelling   Swelling at injection site Has patient had a PCN reaction causing immediate rash, facial/tongue/throat swelling, SOB or lightheadedness with hypotension: No Has  patient had a PCN reaction causing severe rash involving mucus membranes or skin necrosis: No Has patient had a PCN reaction that required hospitalization No Has patient had a PCN reaction occurring within the last 10 years: No If all of the above answers are "NO", then may proceed with Cephalosporin use.   Rosuvastatin Other (See Comments)   unknown   Simvastatin Other (See Comments)   unknown   Terazosin Other (See Comments)   Orthostatic hypotension        Medication List     STOP taking these medications    acetaminophen 325 MG tablet Commonly known as: TYLENOL   aspirin EC 325 MG tablet   metFORMIN 500 MG 24 hr tablet Commonly known as: GLUCOPHAGE-XR   rosuvastatin 20 MG tablet Commonly known as: CRESTOR   spironolactone 100 MG tablet Commonly known as: ALDACTONE       TAKE these medications    apixaban 5 MG Tabs tablet Commonly known as: ELIQUIS Take 1 tablet (5 mg total) by mouth 2 (two) times daily. What changed: how much to take   camphor-menthol lotion Commonly known as: SARNA Apply topically 2 (two) times daily.   carvedilol 3.125 MG tablet Commonly known as: COREG Take 3.125 mg by mouth 2 (two) times daily.   cetirizine 5 MG tablet Commonly known as: ZYRTEC Take 5 mg by mouth every morning.   cholestyramine light 4 g packet Commonly known as: PREVALITE Take 1 packet (4 g total) by mouth 2 times daily at 12 noon and 4 pm.   feeding supplement (GLUCERNA SHAKE) Liqd Take 237 mLs by mouth 2 (two) times daily between meals.   furosemide 20 MG tablet Commonly known as: LASIX Take 1 tablet (20 mg total) by mouth daily. What changed:  medication strength when to take this   glipiZIDE 10 MG tablet Commonly known as: GLUCOTROL Take 1 tablet (10 mg total) by mouth daily before breakfast. What changed: when to take this   hydrOXYzine 25 MG tablet Commonly known as: ATARAX/VISTARIL Take 1 tablet (25 mg total) by mouth 3 (three) times daily as  needed for itching or anxiety.   isosorbide mononitrate 30 MG 24 hr tablet Commonly known as: IMDUR Take 90 mg by mouth every morning.   lactulose 10 GM/15ML solution Commonly known as: CHRONULAC Take 30 mLs (20 g total) by mouth 3 (three) times daily.   loratadine 10 MG tablet Commonly known as: CLARITIN Take 10 mg by mouth at bedtime.   meclizine 25 MG tablet Commonly known as: ANTIVERT Take 1 tablet (25 mg total) by mouth 3 (three) times daily.   pantoprazole 40 MG tablet Commonly known as: PROTONIX Take 1 tablet (40 mg total) by mouth 2 (two) times daily.   polyethylene glycol 17 g packet Commonly known as: MIRALAX / GLYCOLAX Take 8.5 g by mouth daily as needed for mild constipation.   pramoxine-hydrocortisone 1-1 % foam Apply 1 g topically 4 (four) times daily as needed (itching).   rifaximin 550 MG Tabs tablet Commonly known as:  XIFAXAN Take 1 tablet (550 mg total) by mouth 2 (two) times daily.   triamcinolone cream 0.1 % Commonly known as: KENALOG Apply 1 application topically 2 (two) times daily as needed (itching).        Contact information for after-discharge care     Destination     [facility] .   Service: Skilled Nursing Contact information: [address] [place] [address] [phone]                    Allergies  Allergen Reactions   Atorvastatin Other (See Comments)    Unknown   Metoprolol Other (See Comments)    bradycardia   Penicillins Swelling    Swelling at injection site Has patient had a PCN reaction causing immediate rash, facial/tongue/throat swelling, SOB or lightheadedness with hypotension: No Has patient had a PCN reaction causing severe rash involving mucus membranes or skin necrosis: No Has patient had a PCN reaction that required hospitalization No Has patient had a PCN reaction occurring within the last 10 years: No If all of the above answers are "NO", then may  proceed with Cephalosporin use.   Rosuvastatin Other (See Comments)    unknown   Simvastatin Other (See Comments)    unknown   Terazosin Other (See Comments)    Orthostatic hypotension    You were cared for by a hospitalist during your hospital stay. If you have any questions about your discharge medications or the care you received while you were in the hospital after you are discharged, you can call the unit and asked to speak with the hospitalist on call if the hospitalist that took care of you is not available. Once you are discharged, your primary care physician will handle any further medical issues. Please note that no refills for any discharge medications will be authorized once you are discharged, as it is imperative that you return to your primary care physician (or establish a relationship with a primary care physician if you do not have one) for your aftercare needs so that they can reassess your need for medications and monitor your lab values.   Procedures/Studies: MR BRAIN WO CONTRAST  Result Date: 04/06/2021 CLINICAL DATA:  Dizziness, persistent/recurrent, cardiac or vascular cause suspected. History of cirrhosis. EXAM: MRI HEAD WITHOUT CONTRAST TECHNIQUE: Multiplanar, multiecho pulse sequences of the brain and surrounding structures were obtained without intravenous contrast. COMPARISON:  None. FINDINGS: Brain: There is no evidence of an acute infarct, intracranial hemorrhage, mass, midline shift, or extra-axial fluid collection. Scattered small T2 hyperintensities in the cerebral white matter bilaterally are nonspecific but compatible with minimal chronic small vessel ischemic disease. There is symmetric intrinsic T1 hyperintensity in the globi pallidi. A subcentimeter chronic infarct is noted in the right cerebellar hemisphere. There is mild generalized cerebral atrophy. Vascular: Major intracranial vascular flow voids are preserved. Skull and upper cervical spine: Unremarkable bone  marrow signal. Sinuses/Orbits: Unremarkable orbits. Mild mucosal thickening in the paranasal sinuses. Trace right mastoid fluid. Other: None. IMPRESSION: 1. No acute intracranial abnormality. 2. Minimal chronic small vessel ischemic disease in the cerebral white matter. 3. Tiny chronic right cerebellar infarct. 4. T1 hyperintensity in the globi pallidi consistent with chronic liver disease. Electronically Signed   By: Logan Bores M.D.   On: 04/06/2021 13:35   CT ABDOMEN PELVIS W CONTRAST  Result Date: 04/02/2021 CLINICAL DATA:  Abdominal distension EXAM: CT ABDOMEN AND PELVIS WITH CONTRAST TECHNIQUE: Multidetector CT imaging of the abdomen  and pelvis was performed using the standard protocol following bolus administration of intravenous contrast. CONTRAST:  63m OMNIPAQUE IOHEXOL 350 MG/ML SOLN COMPARISON:  None. FINDINGS: Lower chest: The visualized lung bases are clear. Multi-vessel coronary artery stenting has been performed. Advanced coronary artery calcification noted. Global cardiac size within normal limits. No pericardial effusion. Hepatobiliary: The liver is shrunken, the liver contour is nodular, and there is hypertrophy of the left and caudate lobes in keeping with changes of advanced cirrhosis. No enhancing intrahepatic mass is identified. No intra or extrahepatic biliary ductal dilation. There is interval thrombosis of the main and intrahepatic right and left portal veins with numerous peribiliary venous collaterals identified. Cholelithiasis without pericholecystic inflammatory change noted. Pancreas: Unremarkable Spleen: Stable splenomegaly with the spleen measuring 15.2 cm in greatest dimension. No intrasplenic lesions are seen. Adrenals/Urinary Tract: The adrenal glands are unremarkable. The kidneys are normal in position. Mild cortical scarring noted within the upper pole of the left kidney. The kidneys are otherwise unremarkable. The bladder is unremarkable. Stomach/Bowel: The stomach and  small bowel are unremarkable. The appendix is absent. There is circumferential thickening of the cecum and ascending colon likely reflecting changes of portal colopathy. The colon is otherwise unremarkable. Mild ascites is present, similar to prior examination. No free intraperitoneal gas. Vascular/Lymphatic: Numerous gastroepiploic venous collaterals and a splenorenal shunt are identified. Moderate aortoiliac atherosclerotic calcification. No aortic aneurysm. No pathologic adenopathy within the abdomen and pelvis. Reproductive: Moderate prostatic enlargement again noted. Other: Tiny fat containing left inguinal hernia and small ascites containing right inguinal hernia are identified. Ventral hernia repair with mesh has been performed. Musculoskeletal: No acute bone abnormality. Osseous structures are age-appropriate. IMPRESSION: Coronary artery calcification. Evidence of prior coronary artery stenting. Changes of advanced cirrhosis. Interval thrombosis of the portal vein with peribiliary venous collateralization. Evidence of portal venous hypertension with portosystemic collateralization, progressive, moderate splenomegaly and mild, stable ascites. Cholelithiasis. Circumferential thickening of the cecum and ascending colon likely reflecting changes of portal colopathy. Infectious or inflammatory colitis is possible, but considered less likely. No evidence of obstruction or perforation. Moderate prostatic enlargement. Aortic Atherosclerosis (ICD10-I70.0). Electronically Signed   By: AFidela SalisburyM.D.   On: 04/02/2021 02:42   [place]RENAL  Result Date: 04/05/2021 CLINICAL DATA:  Acute kidney injury and history of prior partial left nephrectomy. EXAM: RENAL / URINARY TRACT ULTRASOUND COMPLETE COMPARISON:  None. FINDINGS: Right Kidney: Renal measurements: 11.8 x 5.3 x 5.5 cm = volume: 179 mL. Echogenicity within normal limits. No mass or hydronephrosis visualized. Left Kidney: Renal measurements: 9.0 x 4.9 x 4.4 cm =  volume: 101 mL. Scarring at the level of the upper pole likely is related to the history of partial nephrectomy. No evidence of focal mass or hydronephrosis. Bladder: Appears normal for degree of bladder distention. Other: None. IMPRESSION: No evidence of hydronephrosis bilaterally. Slightly smaller left kidney with upper pole scarring, likely reflecting the given history of prior partial left nephrectomy. Electronically Signed   By: GAletta EdouardM.D.   On: 04/05/2021 16:51   MR 3D Recon At Scanner  Result Date: 04/02/2021 CLINICAL DATA:  70year old male presents with cirrhosis and reported portal vein thrombosis. EXAM: MRI ABDOMEN WITHOUT AND WITH CONTRAST (INCLUDING MRCP) TECHNIQUE: Multiplanar multisequence MR imaging of the abdomen was performed both before and after the administration of intravenous contrast. Heavily T2-weighted images of the biliary and pancreatic ducts were obtained, and three-dimensional MRCP images were rendered by post processing. CONTRAST:  954mGADAVIST GADOBUTROL 1 MMOL/ML IV SOLN COMPARISON:  April 02, 2021. FINDINGS: Lower chest: No effusion. No consolidative process. Limited assessment of the lung bases on MRI Hepatobiliary: Signs of hepatic cirrhosis as before. Cholelithiasis. No biliary duct dilation. Tiny biliary calculus in the distal common bile duct suspected on image 157/100, also seen on the coronal MRCP sequences (image 16/12) Small foci of hyperenhancement noted near the dome of the RIGHT hemi liver (image 7/19) 7 mm (Image 11/19) 8 mm. No additional focal abnormalities on arterial phase or areas of washout on venous phase. In aggregate these are LR category 3. Signs of portal vein thrombosis as seen on recent CT imaging in the main and LEFT portal vein. Suggestion of early cavernous transformation about the porta hepatis. Thrombus propagates into the SMV. The splenic vein is either diminutive or occluded. Pancreas: Normal intrinsic T1 signal. No ductal dilation  or sign of inflammation. No focal lesion. Spleen:  Spleen enlarged as before. Adrenals/Urinary Tract:  Adrenal glands are normal. No suspicious renal lesion though the kidneys are excluded from view on many of the axial sequences. No hydronephrosis. No perinephric stranding. Stomach/Bowel: Edema of the RIGHT colon partially visualized. No acute gastrointestinal process aside from this finding on limited assessment on abdominal MRI. Vascular/Lymphatic: Small splenorenal shunt. 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 Aorta with normal caliber. There is no gastrohepatic or hepatoduodenal ligament lymphadenopathy. No retroperitoneal or mesenteric lymphadenopathy. Other:  Small volume ascites is present about the RIGHT hemi liver. Musculoskeletal: No suspicious bone lesions identified. IMPRESSION: Portal venous thrombosis worse than in 2017 and with potential early cavernous transformation suggests there may be some chronicity. Acute on chronic thrombus is considered. Edema about the RIGHT colon, correlate with any symptoms that would suggest portal colopathy exacerbated by portal thrombosis. 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 Cholelithiasis with choledocholithiasis but without biliary duct dilation. Correlation with symptoms may be helpful. Signs of hepatic cirrhosis with splenomegaly and small volume ascites as on the previous CT. Small foci of hyperenhancement near the dome of the RIGHT hemi liver 7 mm. In aggregate these are LR category 3. Three to six-month follow-up is suggested for further evaluation. Small volume ascites about the RIGHT hemi liver. These results will be called to the ordering clinician or representative by the Radiologist Assistant, and communication documented in the PACS or [employer]. Electronically  Signed   By: Zetta Bills M.D.   On: 04/02/2021 12:27   DG Chest Portable 1 View  Result Date: 04/02/2021 CLINICAL DATA:  Worsening cirrhosis and abdominal distension EXAM: PORTABLE CHEST 1 VIEW COMPARISON:  Abdominal CT from earlier today FINDINGS: Low volume chest with indistinct density at the bases attributed atelectasis. Borderline heart size for technique, accentuated by mediastinal fat. Coronary stenting. No Kerley lines, effusion, or pneumothorax. IMPRESSION: Low volume chest with mild atelectasis at the bases. Electronically Signed   By: Jorje Guild M.D.   On: 04/02/2021 05:27   DG ERCP  Result Date: 04/03/2021 CLINICAL DATA:  70 year old male with ERCP EXAM: ERCP TECHNIQUE: Multiple spot images obtained with the fluoroscopic device and submitted for interpretation post-procedure. FLUOROSCOPY TIME:  Fluoroscopy Time:  1 minute 35 seconds COMPARISON:  MR 04/02/2021 FINDINGS: Limited intraoperative fluoroscopic spot images during ERCP. Initial image demonstrates the endoscope projecting over the upper abdomen with safety wire within the extrahepatic biliary ducts. There is subsequently retrograde infusion of contrast  with partial opacification. Passage of a balloon retrieval catheter. IMPRESSION: Limited images during ERCP demonstrates deployment of balloon retrieval catheter for treatment of choledocholithiasis. Please refer to the dictated operative report for full details of intraoperative findings and procedure. Electronically Signed   By: Corrie Mckusick D.O.   On: 04/03/2021 16:44   MR ABDOMEN MRCP W WO CONTAST  Result Date: 04/02/2021 CLINICAL DATA:  70 year old male presents with cirrhosis and reported portal vein thrombosis. EXAM: MRI ABDOMEN WITHOUT AND WITH CONTRAST (INCLUDING MRCP) TECHNIQUE: Multiplanar multisequence MR imaging of the abdomen was performed both before and after the administration of intravenous contrast. Heavily T2-weighted images of the biliary and pancreatic  ducts were obtained, and three-dimensional MRCP images were rendered by post processing. CONTRAST:  80m GADAVIST GADOBUTROL 1 MMOL/ML IV SOLN COMPARISON:  April 02, 2021. FINDINGS: Lower chest: No effusion. No consolidative process. Limited assessment of the lung bases on MRI Hepatobiliary: Signs of hepatic cirrhosis as before. Cholelithiasis. No biliary duct dilation. Tiny biliary calculus in the distal common bile duct suspected on image 157/100, also seen on the coronal MRCP sequences (image 16/12) Small foci of hyperenhancement noted near the dome of the RIGHT hemi liver (image 7/19) 7 mm (Image 11/19) 8 mm. No additional focal abnormalities on arterial phase or areas of washout on venous phase. In aggregate these are LR category 3. Signs of portal vein thrombosis as seen on recent CT imaging in the main and LEFT portal vein. Suggestion of early cavernous transformation about the porta hepatis. Thrombus propagates into the SMV. The splenic vein is either diminutive or occluded. Pancreas: Normal intrinsic T1 signal. No ductal dilation or sign of inflammation. No focal lesion. Spleen:  Spleen enlarged as before. Adrenals/Urinary Tract:  Adrenal glands are normal. No suspicious renal lesion though the kidneys are excluded from view on many of the axial sequences. No hydronephrosis. No perinephric stranding. Stomach/Bowel: Edema of the RIGHT colon partially visualized. No acute gastrointestinal process aside from this finding on limited assessment on abdominal MRI. Vascular/Lymphatic: Small splenorenal shunt. 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 Aorta with normal caliber. There is no gastrohepatic or hepatoduodenal ligament lymphadenopathy. No retroperitoneal or mesenteric lymphadenopathy. Other:  Small volume ascites is present about the RIGHT hemi liver. Musculoskeletal: No suspicious bone  lesions identified. IMPRESSION: Portal venous thrombosis worse than in 2017 and with potential early cavernous transformation suggests there may be some chronicity. Acute on chronic thrombus is considered. Edema about the RIGHT colon, correlate with any symptoms that would suggest portal colopathy exacerbated by portal thrombosis. 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 Cholelithiasis with choledocholithiasis but without biliary duct dilation. Correlation with symptoms may be helpful. Signs of hepatic cirrhosis with splenomegaly and small volume ascites as on the previous CT. Small foci of hyperenhancement near the dome of the RIGHT hemi liver 7 mm. In aggregate these are LR category 3. Three to six-month follow-up is suggested for further evaluation. Small volume ascites about the RIGHT hemi liver. These results will be called to the ordering clinician or representative by the Radiologist Assistant, and communication documented in the PACS or [employer] Electronically Signed   By: GZetta BillsM.D.   On: 04/02/2021 12:27   IR Paracentesis  Result Date: 04/11/2021 INDICATION: Patient with history of cirrhosis, portal vein thrombosis, recurrent ascites. Request to IR for diagnostic and  therapeutic paracentesis EXAM: ULTRASOUND GUIDED DIAGNOSTIC AND THERAPEUTIC PARACENTESIS MEDICATIONS: 8 mL 1% lidocaine COMPLICATIONS: None immediate. PROCEDURE: Informed written consent was obtained from the patient after a discussion of the risks, benefits and alternatives to treatment. A timeout was performed prior to the initiation of the procedure. Initial ultrasound scanning demonstrates a small amount of ascites within the left lower abdominal quadrant. The left lower abdomen was prepped and draped in the usual sterile fashion. 1% lidocaine was used for local anesthesia. Following this, a 6 Fr Safe-T-Centesis catheter was introduced. An ultrasound  image was saved for documentation purposes. The paracentesis was performed. The catheter was removed and a dressing was applied. The patient tolerated the procedure well without immediate post procedural complication. FINDINGS: A total of approximately 1.2L of clear, bright yellow fluid was removed. Samples were sent to the laboratory as requested by the clinical team. IMPRESSION: Successful ultrasound-guided paracentesis yielding 1.2 liters of peritoneal fluid. Read by Candiss Norse, PA-C Electronically Signed   By: Miachel Roux M.D.   On: 04/11/2021 15:17     The results of significant diagnostics from this hospitalization (including imaging, microbiology, ancillary and laboratory) are listed below for reference.     Microbiology: Recent Results (from the past 240 hour(s))  Surgical pcr screen     Status: None   Collection Time: 04/03/21  8:37 AM   Specimen: Nasal Mucosa; Nasal Swab  Result Value Ref Range Status   MRSA, PCR NEGATIVE NEGATIVE Final   Staphylococcus aureus NEGATIVE NEGATIVE Final    Comment: (NOTE) The Xpert SA Assay (FDA approved for NASAL specimens in patients 33 years of age and older), is one component of a comprehensive surveillance program. It is not intended to diagnose infection nor to guide or monitor treatment. Performed at [facility], [place] [address], [place], [place] [address]   Resp Panel by RT-PCR (Flu A&B, Covid) Nasopharyngeal Swab     Status: None   Collection Time: 04/10/21 12:46 AM   Specimen: Nasopharyngeal Swab; Nasopharyngeal(NP) swabs in vial transport medium  Result Value Ref Range Status   SARS Coronavirus 2 by RT PCR NEGATIVE NEGATIVE Final    Comment: (NOTE) SARS-CoV-2 target nucleic acids are NOT DETECTED.  The SARS-CoV-2 RNA is generally detectable in upper respiratory specimens during the acute phase of infection. The lowest concentration of SARS-CoV-2 viral copies this assay can detect is 138 copies/mL. A negative result  does not preclude SARS-Cov-2 infection and should not be used as the sole basis for treatment or other patient management decisions. A negative result may occur with  improper specimen collection/handling, submission of specimen other than nasopharyngeal swab, presence of viral mutation(s) within the areas targeted by this assay, and inadequate number of viral copies(<138 copies/mL). A negative result must be combined with clinical observations, patient history, and epidemiological information. The expected result is Negative.  Fact Sheet for Patients:  EntrepreneurPulse.com.au  Fact Sheet for Healthcare Providers:  IncredibleEmployment.be  This test is no t yet approved or cleared by the [place] FDA and  has been authorized for detection and/or diagnosis of SARS-CoV-2 by FDA under an Emergency Use Authorization (EUA). This EUA will remain  in effect (meaning this test can be used) for the duration of the COVID-19 declaration under Section 564(b)(1) of the Act, 21 U.S.C.section 360bbb-3(b)(1), unless the authorization is terminated  or revoked sooner.       Influenza A by PCR NEGATIVE NEGATIVE Final   Influenza B by PCR NEGATIVE NEGATIVE Final  Comment: (NOTE) The Xpert Xpress SARS-CoV-2/FLU/RSV plus assay is intended as an aid in the diagnosis of influenza from Nasopharyngeal swab specimens and should not be used as a sole basis for treatment. Nasal washings and aspirates are unacceptable for Xpert Xpress SARS-CoV-2/FLU/RSV testing.  Fact Sheet for Patients: EntrepreneurPulse.com.au  Fact Sheet for Healthcare Providers: IncredibleEmployment.be  This test is not yet approved or cleared by the [place] FDA and has been authorized for detection and/or diagnosis of SARS-CoV-2 by FDA under an Emergency Use Authorization (EUA). This EUA will remain in effect (meaning this test can be used) for  the duration of the COVID-19 declaration under Section 564(b)(1) of the Act, 21 U.S.C. section 360bbb-3(b)(1), unless the authorization is terminated or revoked.  Performed at [facility], [place] [address] [place], [place] [address]   Culture, body fluid w Gram Stain-bottle     Status: None (Preliminary result)   Collection Time: 04/11/21  1:50 PM   Specimen: Peritoneal Washings  Result Value Ref Range Status   Specimen Description PERITONEAL FLUID  Final   Special Requests NONE  Final   Culture   Final    NO GROWTH < 24 HOURS Performed at [facility], [place] [address] [place], [place] [address]    Report Status PENDING  Incomplete  Gram stain     Status: None   Collection Time: 04/11/21  1:50 PM   Specimen: Peritoneal Washings  Result Value Ref Range Status   Specimen Description PERITONEAL FLUID  Final   Special Requests NONE  Final   Gram Stain   Final    MODERATE WBC PRESENT, PREDOMINANTLY MONONUCLEAR NO ORGANISMS SEEN Performed at [facility], [place] [address], [place], [place] [address]    Report Status 04/11/2021 FINAL  Final     Labs: BNP (last 3 results) No results for input(s): BNP in the last 8760 hours. Basic Metabolic Panel: Recent Labs  Lab 04/06/21 0250 04/07/21 0222 04/09/21 0123 04/10/21 0203 04/11/21 0230 04/11/21 1848 04/12/21 0152  NA 133*   < > 135 130* 133* 133* 131*  K 4.9   < > 5.2* 5.1 5.4* 5.6* 5.4*  CL 106   < > 104 102 107 105 104  CO2 19*   < > 23 21* 19* 20* 21*  GLUCOSE 252*   < > 176* 218* 136* 208* 176*  BUN 51*   < > 32* 34* 34* 41* 44*  CREATININE 1.63*   < > 1.39* 1.37* 1.29* 1.52* 1.58*  CALCIUM 8.8*   < > 8.7* 8.5* 8.7* 8.9 8.4*  MG  --   --  1.8 2.0 1.8  --  2.0  PHOS 2.6  --   --   --   --   --  3.4   < > = values in this interval not displayed.   Liver Function Tests: Recent Labs  Lab 04/09/21 0123 04/10/21 0203 04/11/21 0230 04/11/21 1848 04/12/21 0152  AST 155* 156* 168* 166* 151*  ALT 114*  115* 114* 115* 101*  ALKPHOS 192* 193* 204* 194* 177*  BILITOT 11.1* 12.3* 13.1* 14.8* 13.3*  PROT 6.2* 6.3* 6.1* 6.8 6.0*  ALBUMIN 2.0* 2.0* 1.9* 2.5* 2.2*   No results for input(s): LIPASE, AMYLASE in the last 168 hours. Recent Labs  Lab 04/06/21 1324 04/08/21 0233  AMMONIA 113* 75*   CBC: Recent Labs  Lab 04/08/21 0233 04/09/21 0123 04/10/21 0203 04/11/21 0534 04/12/21 0152  WBC 8.5 8.9 9.6 9.6 9.0  HGB 10.9* 10.3* 10.7* 11.1* 9.8*  HCT 31.8* 30.0* 31.3* 31.8* 28.2*  MCV 94.4 94.6 95.4 93.8 95.9  PLT 141* 148* 164 148* 131*   Cardiac Enzymes: No results for input(s): CKTOTAL, CKMB, CKMBINDEX, TROPONINI in the last 168 hours. BNP: Invalid input(s): POCBNP CBG: Recent Labs  Lab 04/11/21 2059 04/12/21 0634 04/12/21 0844 04/12/21 1139 04/12/21 1603  GLUCAP 177* 117* 129* 216* 204*   D-Dimer No results for input(s): DDIMER in the last 72 hours. Hgb A1c No results for input(s): HGBA1C in the last 72 hours. Lipid Profile No results for input(s): CHOL, HDL, LDLCALC, TRIG, CHOLHDL, LDLDIRECT in the last 72 hours. Thyroid function studies No results for input(s): TSH, T4TOTAL, T3FREE, THYROIDAB in the last 72 hours.  Invalid input(s): FREET3 Anemia work up No results for input(s): VITAMINB12, FOLATE, FERRITIN, TIBC, IRON, RETICCTPCT in the last 72 hours. Urinalysis    Component Value Date/Time   COLORURINE AMBER (A) 04/02/2021 0546   APPEARANCEUR CLEAR 04/02/2021 0546   LABSPEC 1.036 (H) 04/02/2021 0546   PHURINE 5.0 04/02/2021 0546   GLUCOSEU NEGATIVE 04/02/2021 0546   HGBUR NEGATIVE 04/02/2021 0546   BILIRUBINUR SMALL (A) 04/02/2021 0546   KETONESUR NEGATIVE 04/02/2021 0546   PROTEINUR NEGATIVE 04/02/2021 0546   NITRITE NEGATIVE 04/02/2021 0546   LEUKOCYTESUR NEGATIVE 04/02/2021 0546   Sepsis Labs Invalid input(s): PROCALCITONIN,  WBC,  LACTICIDVEN Microbiology Recent Results (from the past 240 hour(s))  Surgical pcr screen     Status: None    Collection Time: 04/03/21  8:37 AM   Specimen: Nasal Mucosa; Nasal Swab  Result Value Ref Range Status   MRSA, PCR NEGATIVE NEGATIVE Final   Staphylococcus aureus NEGATIVE NEGATIVE Final    Comment: (NOTE) The Xpert SA Assay (FDA approved for NASAL specimens in patients 54 years of age and older), is one component of a comprehensive surveillance program. It is not intended to diagnose infection nor to guide or monitor treatment. Performed at [facility], [place] [address], [place], [place] [address]   Resp Panel by RT-PCR (Flu A&B, Covid) Nasopharyngeal Swab     Status: None   Collection Time: 04/10/21 12:46 AM   Specimen: Nasopharyngeal Swab; Nasopharyngeal(NP) swabs in vial transport medium  Result Value Ref Range Status   SARS Coronavirus 2 by RT PCR NEGATIVE NEGATIVE Final    Comment: (NOTE) SARS-CoV-2 target nucleic acids are NOT DETECTED.  The SARS-CoV-2 RNA is generally detectable in upper respiratory specimens during the acute phase of infection. The lowest concentration of SARS-CoV-2 viral copies this assay can detect is 138 copies/mL. A negative result does not preclude SARS-Cov-2 infection and should not be used as the sole basis for treatment or other patient management decisions. A negative result may occur with  improper specimen collection/handling, submission of specimen other than nasopharyngeal swab, presence of viral mutation(s) within the areas targeted by this assay, and inadequate number of viral copies(<138 copies/mL). A negative result must be combined with clinical observations, patient history, and epidemiological information. The expected result is Negative.  Fact Sheet for Patients:  EntrepreneurPulse.com.au  Fact Sheet for Healthcare Providers:  IncredibleEmployment.be  This test is no t yet approved or cleared by the [place] FDA and  has been authorized for detection and/or diagnosis of SARS-CoV-2  by FDA under an Emergency Use Authorization (EUA). This EUA will remain  in effect (meaning this test can be used) for the duration of the COVID-19 declaration under Section 564(b)(1) of the Act, 21 U.S.C.section 360bbb-3(b)(1), unless  the authorization is terminated  or revoked sooner.       Influenza A by PCR NEGATIVE NEGATIVE Final   Influenza B by PCR NEGATIVE NEGATIVE Final    Comment: (NOTE) The Xpert Xpress SARS-CoV-2/FLU/RSV plus assay is intended as an aid in the diagnosis of influenza from Nasopharyngeal swab specimens and should not be used as a sole basis for treatment. Nasal washings and aspirates are unacceptable for Xpert Xpress SARS-CoV-2/FLU/RSV testing.  Fact Sheet for Patients: EntrepreneurPulse.com.au  Fact Sheet for Healthcare Providers: IncredibleEmployment.be  This test is not yet approved or cleared by the [place] FDA and has been authorized for detection and/or diagnosis of SARS-CoV-2 by FDA under an Emergency Use Authorization (EUA). This EUA will remain in effect (meaning this test can be used) for the duration of the COVID-19 declaration under Section 564(b)(1) of the Act, 21 U.S.C. section 360bbb-3(b)(1), unless the authorization is terminated or revoked.  Performed at [facility], [place] [address], [place], [place] [address]   Culture, body fluid w Gram Stain-bottle     Status: None (Preliminary result)   Collection Time: 04/11/21  1:50 PM   Specimen: Peritoneal Washings  Result Value Ref Range Status   Specimen Description PERITONEAL FLUID  Final   Special Requests NONE  Final   Culture   Final    NO GROWTH < 24 HOURS Performed at [facility], [place] [address] [place], [place] [address]    Report Status PENDING  Incomplete  Gram stain     Status: None   Collection Time: 04/11/21  1:50 PM   Specimen: Peritoneal Washings  Result Value Ref Range Status   Specimen Description PERITONEAL  FLUID  Final   Special Requests NONE  Final   Gram Stain   Final    MODERATE WBC PRESENT, PREDOMINANTLY MONONUCLEAR NO ORGANISMS SEEN Performed at [facility], [place] [address], [place], [place] [address]    Report Status 04/11/2021 FINAL  Final     Time coordinating discharge:  I have spent 35 minutes face to face with the patient and on the ward discussing the patients care, assessment, plan and disposition with other care givers. >50% of the time was devoted counseling the patient about the risks and benefits of treatment/Discharge disposition and coordinating care.   SIGNED:   Kayleen Memos, MD  [facility] 04/12/2021, 4:10 PM   If 7PM-7AM, please contact night-coverage

## 2021-04-12 NOTE — Progress Notes (Signed)
[facility] ROUNDING NOTE   Subjective: Patient feeling okay this morning.  No acute events overnight.  His hemoglobin dropped a point after restarting the Eliquis, but his bowel movement this morning was normal in color. Abdominal distention improved, but still a little bothersome. Itching much better controlled with cholestyramine Having at least 2 Bms/day with the lactulose He has been having dizzy spells which he says is a chronic problem for him The patient tells me he understands his liver is not working well and that there is no fix for it, and he is 'ready to get on with it' and he knows that 'God is making a place for me'.  He would like to try to work on getting his strength and mobility back, but understands he is very ill and may not ever get back to his baseline.    Objective: Vital signs in last 24 hours: Temp:  [98.1 F (36.7 C)-98.4 F (36.9 C)] 98.1 F (36.7 C) (10/07 0852) Pulse Rate:  [66-72] 66 (10/07 0852) Resp:  [15-22] 22 (10/07 0852) BP: (111-127)/(49-60) 118/58 (10/07 0852) SpO2:  [96 %-99 %] 98 % (10/07 0852) Last BM Date: 04/12/21 General: Frail, elderly pleasant Caucasian male, deeply jaundiced Lungs:  CTA b/l, no w/r/r Heart:  RRR, no m/r/g Abdomen:  Soft, NT, modest distention, +BS Ext:  No c/c/e, no asterixis    Intake/Output from previous day: 10/06 0701 - 10/07 0700 In: 480 [P.O.:480] Out: 1700 [Urine:1700] Intake/Output this shift: No intake/output data recorded.   Lab Results: Recent Labs    04/10/21 0203 04/11/21 0534 04/12/21 0152  WBC 9.6 9.6 9.0  HGB 10.7* 11.1* 9.8*  PLT 164 148* 131*  MCV 95.4 93.8 95.9   BMET Recent Labs    04/11/21 0230 04/11/21 1848 04/12/21 0152  NA 133* 133* 131*  K 5.4* 5.6* 5.4*  CL 107 105 104  CO2 19* 20* 21*  GLUCOSE 136* 208* 176*  BUN 34* 41* 44*  CREATININE 1.29* 1.52* 1.58*  CALCIUM 8.7* 8.9 8.4*   LFT Recent Labs    04/11/21 0230 04/11/21 1848 04/12/21 0152   PROT 6.1* 6.8 6.0*  ALBUMIN 1.9* 2.5* 2.2*  AST 168* 166* 151*  ALT 114* 115* 101*  ALKPHOS 204* 194* 177*  BILITOT 13.1* 14.8* 13.3*   PT/INR No results for input(s): INR in the last 72 hours.    Imaging/Other results: IR Paracentesis  Result Date: 04/11/2021 INDICATION: Patient with history of cirrhosis, portal vein thrombosis, recurrent ascites. Request to IR for diagnostic and therapeutic paracentesis EXAM: ULTRASOUND GUIDED DIAGNOSTIC AND THERAPEUTIC PARACENTESIS MEDICATIONS: 8 mL 1% lidocaine COMPLICATIONS: None immediate. PROCEDURE: Informed written consent was obtained from the patient after a discussion of the risks, benefits and alternatives to treatment. A timeout was performed prior to the initiation of the procedure. Initial ultrasound scanning demonstrates a small amount of ascites within the left lower abdominal quadrant. The left lower abdomen was prepped and draped in the usual sterile fashion. 1% lidocaine was used for local anesthesia. Following this, a 6 Fr Safe-T-Centesis catheter was introduced. An ultrasound image was saved for documentation purposes. The paracentesis was performed. The catheter was removed and a dressing was applied. The patient tolerated the procedure well without immediate post procedural complication. FINDINGS: A total of approximately 1.2L of clear, bright yellow fluid was removed. Samples were sent to the laboratory as requested by the clinical team. IMPRESSION: Successful ultrasound-guided paracentesis yielding 1.2 liters of peritoneal fluid. Read by Candiss Norse, PA-C Electronically Signed  By: Sharen Heck  Mir M.D.   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 In the past few days, he has had worsened hyperbilirubinemia with increased bloating but no other focal symptoms or fevers.  He had paracentesis today which helped his bloating and will r/o SBP.   I had a long discussion with the patient today and he seems to accept that his liver is likely not going to get much better and that he has short life expectancy at this point.  He would like to try to get out of the hospital for rehab and his goal is to make it back home.  I told him that palliative care will see him soon and will go over his goals of care     Decompensated cirrhosis, not a transplant candidate -  Recommend infectious work up if [place] continues to trend up;    Hepatic encephalopathy - Rifaximin 550 mg PO BID - Continue lactulose BID for now   Ascites - Lasix 20 mg PO daily for now   Pruritis - Continue cholestyramine 4gm bid, should be given 1 hr after other oral medications as this has helped his pruritis - Use anti-histamines sparingly given HE   PVT/hx of MVT - Given the patient's history of MVT and progression of PVT, continued anticoagulation is recommended.  If GI bleed occurs, then the benefits of anticoagulation should be reassessed   Gastroesophagal varices/PHG - Continue Coreg - Recommend once daily PPI   Questionable GI bleed/FOBT - Patient has portal hypertensive gastropathy which would likely cause a positive FOBT.  Hgb stable.  No need for endoscopic evaluation.   Hepatic lesion - Will need repeat MRI in 3-6 months.   AKI - Acute injury resolved, but still with renal insufficiency continue to avoid NSAIDs - Diuretics restarted at low dose to help with ascites and lower potassium - Continue to monitor  GI will follow patient peripherally at this point.  Please contact us if any new questions or issues arise that we can assist with.    Daryel November, MD  04/12/2021, 10:37  AM [facility]

## 2021-04-12 NOTE — Progress Notes (Signed)
   Palliative Medicine Inpatient Follow Up Note   Family meeting at 1400  Participants: Ival Bible (significant other), Shanon Payor (son)   Providers present: Tacey Ruiz, [facility]  I met at bedside with patient's family to discuss his present prognosis in the setting of end-stage liver disease.  We reviewed the gastroenterology notes.  We reviewed that Kassidy is not a transplant candidate.  We discussed comprehensive medication management to help keep Sy's symptoms at [facility].  We reviewed the differences between anticoagulation and not anticoagulating in a patient with a history of GI bleeding from varices and cirrhosis.  A conversation regarding patient's acute kidney injury was had, we reviewed not taking any additional NSAIDs.  We reviewed that if symptom burden increases with ongoing refractory ascites that we may need to consider a Pleurx most especially if the goals evolve into the need for hospice care.  I explained to patient and his family what a skilled nursing facility is and the expectations when going to one.  He shared with me that he is motivated to get stronger and to improve his present situation.  He shares that at this point in time he is not ready to "give up".  We reviewed the differences between outpatient palliative support and hospice care.  We discussed that right now his symptoms can be managed through appropriate medication administration.  We reviewed that if an increase in symptom burden does occur an outpatient palliative provider would be a great resource for him.  This would also enable the opportunity for them to keep a close eye on Laken's progress or lack thereof and if additional conversations on hospice should be broached.  As of presently both patient and family are all in agreement with transition to skilled nursing and outpatient palliative care support.  The differences between palliative care and hospice care were described in great  detail.  Questions answered to their entirety.  Palliative support provided  SUMMARY OF RECOMMENDATIONS   DNAR/DNI   Plan for transition to skilled nursing facility to work on strength  TOC: Appreciate referral for outpatient palliative support on discharge   Goals: To spend time with five grandchildren   Palliative care will continue to be involved  Time In: 1400 Time Out: 1505 Time Spent: 65 additional minutes Greater than 50% of the time was spent in counseling and coordination of care ______________________________________________________________________________________ [facility] Team Team Cell Phone: [phone] Please utilize secure chat with additional questions, if there is no response within 30 minutes please call the above phone number  Palliative Medicine Team providers are available by phone from 7am to 7pm daily and can be reached through the team cell phone.  Should this patient require assistance outside of these hours, please call the patient's attending physician.

## 2021-04-12 NOTE — Progress Notes (Signed)
Inpatient Diabetes Program Recommendations  AACE/ADA: New Consensus Statement on Inpatient Glycemic Control (2015)  Target Ranges:  Prepandial:   less than 140 mg/dL      Peak postprandial:   less than 180 mg/dL (1-2 hours)      Critically ill patients:  140 - 180 mg/dL  Results for Jerry [name], Jerry [name] (MRN [id number]) as of 04/12/2021 14:12  Ref. Range 04/11/2021 06:41 04/11/2021 11:20 04/11/2021 16:52 04/11/2021 20:59  Glucose-Capillary Latest Ref Range: 70 - 99 mg/dL 133 (H)  3 units Novolog   222 (H)  7 units Novolog  280 (H)  11 units Novolog  177 (H)  Results for Jerry [name], Jerry [name] (MRN [id number]) as of 04/12/2021 14:12  Ref. Range 04/12/2021 08:44 04/12/2021 11:39  Glucose-Capillary Latest Ref Range: 70 - 99 mg/dL 129 (H)  3 units Novolog  216 (H)  7 units Novolog     Home DM Meds: Metformin 500 mg BID       Glipizide 10 mg BID  Current Orders: Novolog Resistant Correction Scale/ SSI (0-20 units) TID AC + HS     MD- Note patient getting solid PO diet + PO Ensure supps TID between meals  Please consider adding low dose Novolog Meal Coverage:  Novolog 3 units TID with meals  Hold if pt eats <50% of meal, Hold if pt NPO   --Will follow patient during hospitalization--  Wyn Quaker RN, MSN, CDE Diabetes Coordinator Inpatient Glycemic Control Team Team Pager: [phone] (8a-5p)

## 2021-04-12 NOTE — TOC Transition Note (Signed)
Transition of Care [facility]) - CM/SW Discharge Note   Patient Details  Name: Jerry [name] MRN: [id number] Date of Birth: 03/19/1951  Transition of Care [facility]) CM/SW Contact:  Milinda Antis, [place] Phone Number: 04/12/2021, 4:19 PM   Clinical Narrative:     Patient will DC to: [facility]  Anticipated DC date: 04/12/2021 Family notified: Yes Transport by: Corey Harold   Per MD patient ready for DC to SNF. RN to call report prior to discharge [phone]. RN, patient, patient's family, and facility notified of DC. Discharge Summary and FL2 sent to facility. DC packet on chart. Ambulance transport requested for patient.   CSW will sign off for now as social work intervention is no longer needed. Please consult [place] again if new needs arise.    Final next level of care: Skilled Nursing Facility Barriers to Discharge: Barriers Resolved   Patient Goals and CMS Choice        Discharge Placement              Patient chooses bed at:  [facility]) Patient to be transferred to facility by: [place] Name of family member notified: Luci Bank   [phone] Patient and family notified of of transfer: 04/12/21  Discharge Plan and Services                                     Social Determinants of Health (SDOH) Interventions     Readmission Risk Interventions No flowsheet data found.

## 2021-04-12 NOTE — Progress Notes (Signed)
Report called to Leata Mouse, LPN of [facility].  All questions answered, patient currently A&Ox4 with VSS.  Patient and family informed of impending transfer, AVS printed, placed in D/C packet and will be given to the team transporting him.

## 2021-04-15 LAB — CULTURE, BODY FLUID W GRAM STAIN -BOTTLE

## 2021-04-15 LAB — CYTOLOGY - NON PAP

## 2021-04-15 NOTE — Progress Notes (Signed)
Patient's peritoneal fluid grew Staphylococcus capitis sensitive to ciprofloxacin.  Called [facility] on 04/15/2021 and spoke with the charge RN/supervisor Ms. Leata Mouse to update her on this new finding.  Gave the verbal order for prescription ciprofloxacin 500 mg twice daily x5 days.  She stated that the patient will start this medication tonight.  She will inform the MD who rounds once a week of his new medication.  Discussed with Dr. Candis Schatz, GI, on 04/15/2021 via secure chat.  He suspects it is a contaminant, however we will treat x5 days to be safe.

## 2021-04-26 ENCOUNTER — Other Ambulatory Visit: Payer: Self-pay

## 2021-04-26 ENCOUNTER — Emergency Department: Payer: No Typology Code available for payment source

## 2021-04-26 ENCOUNTER — Emergency Department
Admission: EM | Admit: 2021-04-26 | Discharge: 2021-04-26 | Disposition: A | Discharge status: Home / Self Care | Payer: No Typology Code available for payment source | Attending: Emergency Medicine | Admitting: Emergency Medicine

## 2021-04-26 DIAGNOSIS — R188 Other ascites: Secondary | ICD-10-CM | POA: Insufficient documentation

## 2021-04-26 DIAGNOSIS — Z79899 Other long term (current) drug therapy: Secondary | ICD-10-CM | POA: Insufficient documentation

## 2021-04-26 DIAGNOSIS — Z96612 Presence of left artificial shoulder joint: Secondary | ICD-10-CM | POA: Diagnosis not present

## 2021-04-26 DIAGNOSIS — I25119 Atherosclerotic heart disease of native coronary artery with unspecified angina pectoris: Secondary | ICD-10-CM | POA: Diagnosis not present

## 2021-04-26 DIAGNOSIS — Z955 Presence of coronary angioplasty implant and graft: Secondary | ICD-10-CM | POA: Insufficient documentation

## 2021-04-26 DIAGNOSIS — Z7984 Long term (current) use of oral hypoglycemic drugs: Secondary | ICD-10-CM | POA: Diagnosis not present

## 2021-04-26 DIAGNOSIS — R0602 Shortness of breath: Secondary | ICD-10-CM | POA: Diagnosis not present

## 2021-04-26 DIAGNOSIS — K219 Gastro-esophageal reflux disease without esophagitis: Secondary | ICD-10-CM | POA: Insufficient documentation

## 2021-04-26 DIAGNOSIS — Z85528 Personal history of other malignant neoplasm of kidney: Secondary | ICD-10-CM | POA: Diagnosis not present

## 2021-04-26 DIAGNOSIS — R140 Abdominal distension (gaseous): Secondary | ICD-10-CM

## 2021-04-26 DIAGNOSIS — I10 Essential (primary) hypertension: Secondary | ICD-10-CM | POA: Insufficient documentation

## 2021-04-26 DIAGNOSIS — Z7901 Long term (current) use of anticoagulants: Secondary | ICD-10-CM | POA: Insufficient documentation

## 2021-04-26 DIAGNOSIS — R109 Unspecified abdominal pain: Secondary | ICD-10-CM | POA: Diagnosis present

## 2021-04-26 DIAGNOSIS — E119 Type 2 diabetes mellitus without complications: Secondary | ICD-10-CM | POA: Diagnosis not present

## 2021-04-26 LAB — COMPREHENSIVE METABOLIC PANEL
ALT: 134 U/L — ABNORMAL HIGH (ref 0–44)
AST: 239 U/L — ABNORMAL HIGH (ref 15–41)
Albumin: 1.9 g/dL — ABNORMAL LOW (ref 3.5–5.0)
Alkaline Phosphatase: 227 U/L — ABNORMAL HIGH (ref 38–126)
Anion gap: 8 (ref 5–15)
BUN: 87 mg/dL — ABNORMAL HIGH (ref 8–23)
CO2: 16 mmol/L — ABNORMAL LOW (ref 22–32)
Calcium: 8.7 mg/dL — ABNORMAL LOW (ref 8.9–10.3)
Chloride: 112 mmol/L — ABNORMAL HIGH (ref 98–111)
Creatinine, Ser: 2.19 mg/dL — ABNORMAL HIGH (ref 0.61–1.24)
GFR, Estimated: 31 mL/min — ABNORMAL LOW (ref >60)
Glucose, Bld: 64 mg/dL — ABNORMAL LOW (ref 70–99)
Potassium: 4.4 mmol/L (ref 3.5–5.1)
Sodium: 136 mmol/L (ref 135–145)
Total Bilirubin: 12.7 mg/dL — ABNORMAL HIGH (ref 0.3–1.2)
Total Protein: 6.1 g/dL — ABNORMAL LOW (ref 6.5–8.1)

## 2021-04-26 LAB — CBC
HCT: 29.1 % — ABNORMAL LOW (ref 39.0–52.0)
Hemoglobin: 10 g/dL — ABNORMAL LOW (ref 13.0–17.0)
MCH: 34.6 pg — ABNORMAL HIGH (ref 26.0–34.0)
MCHC: 34.4 g/dL (ref 30.0–36.0)
MCV: 100.7 fL — ABNORMAL HIGH (ref 80.0–100.0)
Platelets: 187 10*3/uL (ref 150–400)
RBC: 2.89 MIL/uL — ABNORMAL LOW (ref 4.22–5.81)
RDW: 19.9 % — ABNORMAL HIGH (ref 11.5–15.5)
WBC: 9.1 10*3/uL (ref 4.0–10.5)
nRBC: 0 % (ref 0.0–0.2)

## 2021-04-26 LAB — URINALYSIS, ROUTINE W REFLEX MICROSCOPIC
Glucose, UA: NEGATIVE mg/dL
Hgb urine dipstick: NEGATIVE
Ketones, ur: NEGATIVE mg/dL
Leukocytes,Ua: NEGATIVE
Nitrite: NEGATIVE
Protein, ur: NEGATIVE mg/dL
Specific Gravity, Urine: 1.015 (ref 1.005–1.030)
pH: 5 (ref 5.0–8.0)

## 2021-04-26 LAB — AMMONIA: Ammonia: 87 umol/L — ABNORMAL HIGH (ref 9–35)

## 2021-04-26 LAB — LIPASE, BLOOD: Lipase: 98 U/L — ABNORMAL HIGH (ref 11–51)

## 2021-04-26 MED ORDER — FENTANYL CITRATE PF 50 MCG/ML IJ SOSY
50.0000 ug | PREFILLED_SYRINGE | Freq: Once | INTRAMUSCULAR | Status: DC
Start: 2021-04-26 — End: 2021-04-27

## 2021-04-26 MED ORDER — ALBUMIN HUMAN 25 % IV SOLN
25.0000 g | Freq: Once | INTRAVENOUS | Status: AC
  Administered 2021-04-26: 25 g via INTRAVENOUS
  Filled 2021-04-26: qty 100

## 2021-04-26 NOTE — Discharge Instructions (Addendum)
Please follow-up with your doctor at the [facility] regarding GI referral for ongoing care and scheduled paracenteses.  Return to the emergency department for any worsening abdominal pain, fever or any other symptom personally concerning to yourself.

## 2021-04-26 NOTE — ED Notes (Signed)
[facility]  CALLED  FOR  TRANSPORT  TO  [facility]

## 2021-04-26 NOTE — ED Provider Notes (Signed)
[facility] Emergency Department Provider Note  Time seen: 1:11 PM  I have reviewed the triage vital signs and the nursing notes.   HISTORY  Chief Complaint Abdominal Pain   HPI Jerry [name] is a 70 y.o. male with a past medical history of CAD, diabetes, gastric reflux, hypertension, hyperlipidemia, liver failure, presents to the emergency department for abdominal distention and discomfort.  According to the patient and family patient last had a paracentesis approximately 3 weeks ago.  Patient states his abdomen is swollen to the point where he is having discomfort at times and shortness of breath especially if he lies flat.  Patient satting 97% on room air.  Denies any abdominal pain currently.     Past Medical History:  Diagnosis Date   Arthritis    Coronary artery disease    Diabetes mellitus without complication (HCC)    GERD (gastroesophageal reflux disease)    food related only with spicy only   Headache    h/o as a child-migraines   History of appendectomy    History of hiatal hernia    Hx of heart artery stent    Hypercholesteremia    Hypertension    Liver lesion    Primary cancer of kidney or ureter    Kidney cancer with partial removal of left kidney   PTSD (post-traumatic stress disorder)    Thrombosis    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    Patient Active Problem List   Diagnosis Date Noted   Malnutrition of moderate degree 04/05/2021   Decompensated liver disease ([place])    Hyponatremia    AKI (acute kidney injury) ([place])    Transaminitis 04/02/2021   Jaundice 04/02/2021   Choledocholithiasis with obstruction 04/02/2021   Leukocytosis 04/02/2021   GERD (gastroesophageal reflux disease) 04/02/2021   Portal hypertension (HCC)    Choledocholithiasis    Elevated liver enzymes    Status post reverse arthroplasty of shoulder, 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   Chest pain due to CAD ([place]) 03/27/2017   Upper GI bleed    S/P  coronary artery stent placement    Coronary artery disease involving native coronary artery of native heart with angina pectoris ([place])    Chest pain 03/19/2017   Liver cirrhosis secondary to NASH ([place]) 03/19/2017   Portal vein thrombosis 01/21/2016   Type 2 diabetes mellitus without complication, without long-term current use of insulin ([place]) 01/21/2016   Coronary artery disease due to lipid rich plaque 01/21/2016   Essential hypertension     Past Surgical History:  Procedure Laterality Date   APPENDECTOMY     age 61   CARPAL TUNNEL RELEASE Right    CORONARY STENT INTERVENTION  03/23/2017   STENT SYNERGY DES [id number] drug eluting stent was successfully placed   CORONARY STENT INTERVENTION N/A 03/23/2017   Procedure: CORONARY STENT INTERVENTION;  Surgeon: Jettie Booze, MD;  Location: [place] CV LAB;  Service: Cardiovascular;  Laterality: N/A;   ERCP N/A 04/03/2021   Procedure: ENDOSCOPIC RETROGRADE CHOLANGIOPANCREATOGRAPHY (ERCP);  Surgeon: Milus Banister, MD;  Location: [facility] ENDOSCOPY;  Service: Endoscopy;  Laterality: N/A;   HAND SURGERY     IR PARACENTESIS  04/11/2021   LEFT HEART CATH AND CORONARY ANGIOGRAPHY N/A 03/23/2017   Procedure: LEFT HEART CATH AND CORONARY ANGIOGRAPHY;  Surgeon: Jettie Booze, MD;  Location: [place] CV LAB;  Service: Cardiovascular;  Laterality: N/A;   PARACENTESIS     PARTIAL NEPHRECTOMY Left    PILONIDAL  CYST EXCISION     REVERSE SHOULDER ARTHROPLASTY Left 09/01/2019   Procedure: REVERSE SHOULDER ARTHROPLASTY;  Surgeon: Corky Mull, MD;  Location: [facility] ORS;  Service: Orthopedics;  Laterality: Left;   SHOULDER SURGERY Left    x4   SPHINCTEROTOMY  04/03/2021   Procedure: SPHINCTEROTOMY;  Surgeon: Milus Banister, MD;  Location: [facility] ENDOSCOPY;  Service: Endoscopy;;  balloon sweep no stones   STENT PLACEMENT VASCULAR ([place] HX)      Prior to Admission medications   Medication Sig Start Date End Date Taking? Authorizing Provider   apixaban (ELIQUIS) 5 MG TABS tablet Take 1 tablet (5 mg total) by mouth 2 (two) times daily. 04/12/21   Kayleen Memos, DO  camphor-menthol [facility]) lotion Apply topically 2 (two) times daily. 04/12/21   Kayleen Memos, DO  carvedilol (COREG) 3.125 MG tablet Take 3.125 mg by mouth 2 (two) times daily. 10/31/20   [provider]  cetirizine (ZYRTEC) 5 MG tablet Take 5 mg by mouth every morning. 03/06/21   [provider]  cholestyramine light (PREVALITE) 4 g packet Take 1 packet (4 g total) by mouth 2 times daily at 12 noon and 4 pm. 04/12/21 05/12/21  Kayleen Memos, DO  feeding supplement, GLUCERNA SHAKE, (GLUCERNA SHAKE) LIQD Take 237 mLs by mouth 2 (two) times daily between meals.    [provider]  furosemide (LASIX) 20 MG tablet Take 1 tablet (20 mg total) by mouth daily. 04/12/21   Kayleen Memos, DO  glipiZIDE (GLUCOTROL) 10 MG tablet Take 1 tablet (10 mg total) by mouth daily before breakfast. 04/12/21   Kayleen Memos, DO  hydrOXYzine (ATARAX/VISTARIL) 25 MG tablet Take 1 tablet (25 mg total) by mouth 3 (three) times daily as needed for itching or anxiety. 04/12/21   Kayleen Memos, DO  isosorbide mononitrate (IMDUR) 30 MG 24 hr tablet Take 90 mg by mouth every morning. 03/05/21   [provider]  lactulose (CHRONULAC) 10 GM/15ML solution Take 30 mLs (20 g total) by mouth 3 (three) times daily. 04/12/21   Kayleen Memos, DO  loratadine (CLARITIN) 10 MG tablet Take 10 mg by mouth at bedtime. 03/06/21   [provider]  meclizine (ANTIVERT) 25 MG tablet Take 1 tablet (25 mg total) by mouth 3 (three) times daily. 04/12/21   Kayleen Memos, DO  pantoprazole (PROTONIX) 40 MG tablet Take 1 tablet (40 mg total) by mouth 2 (two) times daily. 04/10/21 05/10/21  Amin, Jeanella Flattery, MD  polyethylene glycol (MIRALAX / GLYCOLAX) 17 g packet Take 8.5 g by mouth daily as needed for mild constipation.    [provider]  pramoxine-hydrocortisone 1-1 % foam Apply 1 g  topically 4 (four) times daily as needed (itching).    [provider]  rifaximin (XIFAXAN) 550 MG TABS tablet Take 1 tablet (550 mg total) by mouth 2 (two) times daily. 04/12/21 05/12/21  Kayleen Memos, DO  triamcinolone cream (KENALOG) 0.1 % Apply 1 application topically 2 (two) times daily as needed (itching).    [provider]    Allergies  Allergen Reactions   Atorvastatin Other (See Comments)    Unknown   Metoprolol Other (See Comments)    bradycardia   Penicillins Swelling    Swelling at injection site Has patient had a PCN reaction causing immediate rash, facial/tongue/throat swelling, SOB or lightheadedness with hypotension: No Has patient had a PCN reaction causing severe rash involving mucus membranes or skin necrosis: No Has patient  had a PCN reaction that required hospitalization No Has patient had a PCN reaction occurring within the last 10 years: No If all of the above answers are "NO", then may proceed with Cephalosporin use.   Rosuvastatin Other (See Comments)    unknown   Simvastatin Other (See Comments)    unknown   Terazosin Other (See Comments)    Orthostatic hypotension    Family History  Problem Relation Age of Onset   Stroke Mother    Heart attack Mother    Stroke Father    Heart attack Father    Lung cancer Brother     Social History Social History   Tobacco Use   Smoking status: Never   Smokeless tobacco: Never  Vaping Use   Vaping Use: Never used  Substance Use Topics   Alcohol use: No   Drug use: No    Review of Systems Constitutional: Negative for fever. Cardiovascular: Negative for chest pain. Respiratory: Negative for shortness of breath. Gastrointestinal: Negative for abdominal pain, vomiting and diarrhea. Genitourinary: Negative for urinary compaints Musculoskeletal: Negative for musculoskeletal complaints Skin: Negative for skin complaints  Neurological: Negative for headache All other ROS  negative  ____________________________________________   PHYSICAL EXAM:  VITAL SIGNS: ED Triage Vitals [04/26/21 1133]  Enc Vitals Group     BP 121/63     Pulse Rate 66     Resp 18     Temp 98 F (36.7 C)     Temp Source Oral     SpO2 97 %     Weight      Height      Head Circumference      Peak Flow      Pain Score      Pain Loc      Pain Edu?      Excl. in [place]?     Constitutional: Alert and oriented.  No acute distress.  Significant jaundice and scleral icterus noted. Eyes: Normal exam with scleral icterus. ENT      Head: Normocephalic and atraumatic.      Mouth/Throat: Mucous membranes are moist. Cardiovascular: Normal rate, regular rhythm. Respiratory: Normal respiratory effort without tachypnea nor retractions. Breath sounds are clear  Gastrointestinal: Soft but significantly distended abdomen.  Dull percussion consistent with ascites.  Patient does have what appears to be a hernia likely filled with ascitic fluid to the right inguinal canal, nontender. Musculoskeletal: Nontender with normal range of motion in all extremities.  Neurologic:  Normal speech and language. No gross focal neurologic deficits  Skin:  Skin is warm, dry and intact.  Jaundiced appearance. Psychiatric: Mood and affect are normal.  ____________________________________________   INITIAL IMPRESSION / ASSESSMENT AND PLAN / ED COURSE  Pertinent labs & imaging results that were available during my care of the patient were reviewed by me and considered in my medical decision making (see chart for details).   Patient presents to the emergency department for abdominal distention shortness of breath when lying flat.  Patient does have significant abdominal distention dull percussion and nontender.  Consistent with ascitic fluid.  No concern for SBP.  Patient has chronic liver disease follows up at the [place] but has not yet been provided a GI physician.  I spoke to the patient regarding this as he will need  a GI physician follow-up with repeated paracentesis.  Given the patient's distention and subjective shortness of breath although 97% on room air we will have interventional radiology perform an ultrasound-guided paracentesis.  We  will dose 25 g of albumin following the paracentesis given likely large volume removal.  No other significant findings on physical exam.  Lab work although abnormal is largely unchanged from baseline.  Spoke with interventional radiology.  We will obtain a therapeutic paracentesis we will dose albumin following with plan to discharge home.  Patient states he will follow-up with the [facility] for GI referral.  GAELEN BRAGER was evaluated in Emergency Department on 04/26/2021 for the symptoms described in the history of present illness. He was evaluated in the context of the global COVID-19 pandemic, which necessitated consideration that the patient might be at risk for infection with the SARS-CoV-2 virus that causes COVID-19. Institutional protocols and algorithms that pertain to the evaluation of patients at risk for COVID-19 are in a state of rapid change based on information released by regulatory bodies including the CDC and federal and state organizations. These policies and algorithms were followed during the patient's care in the ED.  ____________________________________________   FINAL CLINICAL IMPRESSION(S) / ED DIAGNOSES  Ascites   Harvest Dark, MD 04/26/21 1400

## 2021-04-26 NOTE — ED Notes (Signed)
Pt waiting for EMS transport , for discharge

## 2021-04-26 NOTE — Procedures (Signed)
Interventional Radiology Procedure:   Indications: Cirrhosis and abdominal distention  Procedure: US guided paracentesis  Findings: Removed 3000 ml from left lower quadrant  Complications: None     EBL: Less than 10 ml   [place] R. Anselm Pancoast, MD  Pager: [phone]

## 2021-04-26 NOTE — ED Triage Notes (Signed)
Pt comes into the ED via EMS from [facility] was recently seen for abd distention, paracentesis , family is concerned for the need for another paracentesis.   115/64 98%RA 67HR

## 2021-04-26 NOTE — ED Notes (Signed)
Attempted to call report to [facility]. No answer

## 2021-04-28 ENCOUNTER — Inpatient Hospital Stay: Payer: No Typology Code available for payment source

## 2021-04-28 ENCOUNTER — Emergency Department: Payer: No Typology Code available for payment source

## 2021-04-28 ENCOUNTER — Emergency Department (HOSPITAL_COMMUNITY)
Admission: EM | Admit: 2021-04-28 | Discharge: 2021-04-28 | Discharge status: Absent without leave | Payer: No Typology Code available for payment source

## 2021-04-28 ENCOUNTER — Inpatient Hospital Stay
Admission: EM | Admit: 2021-04-28 | Discharge: 2021-05-03 | DRG: 432 | Disposition: A | Discharge status: Hospice | Payer: No Typology Code available for payment source | Attending: Internal Medicine | Admitting: Internal Medicine

## 2021-04-28 ENCOUNTER — Other Ambulatory Visit: Payer: Self-pay

## 2021-04-28 DIAGNOSIS — Z6825 Body mass index (BMI) 25.0-25.9, adult: Secondary | ICD-10-CM

## 2021-04-28 DIAGNOSIS — F4310 Post-traumatic stress disorder, unspecified: Secondary | ICD-10-CM | POA: Diagnosis present

## 2021-04-28 DIAGNOSIS — K766 Portal hypertension: Secondary | ICD-10-CM | POA: Diagnosis present

## 2021-04-28 DIAGNOSIS — K219 Gastro-esophageal reflux disease without esophagitis: Secondary | ICD-10-CM | POA: Diagnosis present

## 2021-04-28 DIAGNOSIS — Z8249 Family history of ischemic heart disease and other diseases of the circulatory system: Secondary | ICD-10-CM

## 2021-04-28 DIAGNOSIS — Z85528 Personal history of other malignant neoplasm of kidney: Secondary | ICD-10-CM

## 2021-04-28 DIAGNOSIS — R791 Abnormal coagulation profile: Secondary | ICD-10-CM | POA: Diagnosis present

## 2021-04-28 DIAGNOSIS — E7800 Pure hypercholesterolemia, unspecified: Secondary | ICD-10-CM | POA: Diagnosis present

## 2021-04-28 DIAGNOSIS — Z20822 Contact with and (suspected) exposure to covid-19: Secondary | ICD-10-CM | POA: Diagnosis present

## 2021-04-28 DIAGNOSIS — R188 Other ascites: Secondary | ICD-10-CM | POA: Diagnosis present

## 2021-04-28 DIAGNOSIS — Z801 Family history of malignant neoplasm of trachea, bronchus and lung: Secondary | ICD-10-CM | POA: Diagnosis not present

## 2021-04-28 DIAGNOSIS — I81 Portal vein thrombosis: Secondary | ICD-10-CM | POA: Diagnosis present

## 2021-04-28 DIAGNOSIS — D638 Anemia in other chronic diseases classified elsewhere: Secondary | ICD-10-CM | POA: Diagnosis present

## 2021-04-28 DIAGNOSIS — Z79899 Other long term (current) drug therapy: Secondary | ICD-10-CM | POA: Diagnosis not present

## 2021-04-28 DIAGNOSIS — E119 Type 2 diabetes mellitus without complications: Secondary | ICD-10-CM | POA: Diagnosis present

## 2021-04-28 DIAGNOSIS — Z66 Do not resuscitate: Secondary | ICD-10-CM | POA: Diagnosis present

## 2021-04-28 DIAGNOSIS — D689 Coagulation defect, unspecified: Secondary | ICD-10-CM | POA: Diagnosis present

## 2021-04-28 DIAGNOSIS — I10 Essential (primary) hypertension: Secondary | ICD-10-CM | POA: Diagnosis present

## 2021-04-28 DIAGNOSIS — K7469 Other cirrhosis of liver: Secondary | ICD-10-CM | POA: Diagnosis present

## 2021-04-28 DIAGNOSIS — N179 Acute kidney failure, unspecified: Secondary | ICD-10-CM | POA: Diagnosis present

## 2021-04-28 DIAGNOSIS — Z7901 Long term (current) use of anticoagulants: Secondary | ICD-10-CM

## 2021-04-28 DIAGNOSIS — I2510 Atherosclerotic heart disease of native coronary artery without angina pectoris: Secondary | ICD-10-CM | POA: Diagnosis present

## 2021-04-28 DIAGNOSIS — E43 Unspecified severe protein-calorie malnutrition: Secondary | ICD-10-CM | POA: Diagnosis present

## 2021-04-28 DIAGNOSIS — Z9049 Acquired absence of other specified parts of digestive tract: Secondary | ICD-10-CM

## 2021-04-28 DIAGNOSIS — I25119 Atherosclerotic heart disease of native coronary artery with unspecified angina pectoris: Secondary | ICD-10-CM | POA: Diagnosis present

## 2021-04-28 DIAGNOSIS — Z905 Acquired absence of kidney: Secondary | ICD-10-CM

## 2021-04-28 DIAGNOSIS — K7460 Unspecified cirrhosis of liver: Secondary | ICD-10-CM | POA: Diagnosis present

## 2021-04-28 DIAGNOSIS — E440 Moderate protein-calorie malnutrition: Secondary | ICD-10-CM | POA: Diagnosis present

## 2021-04-28 DIAGNOSIS — Z7984 Long term (current) use of oral hypoglycemic drugs: Secondary | ICD-10-CM | POA: Diagnosis not present

## 2021-04-28 DIAGNOSIS — N170 Acute kidney failure with tubular necrosis: Secondary | ICD-10-CM | POA: Diagnosis present

## 2021-04-28 DIAGNOSIS — Z955 Presence of coronary angioplasty implant and graft: Secondary | ICD-10-CM

## 2021-04-28 DIAGNOSIS — R748 Abnormal levels of other serum enzymes: Secondary | ICD-10-CM | POA: Diagnosis not present

## 2021-04-28 DIAGNOSIS — K810 Acute cholecystitis: Secondary | ICD-10-CM | POA: Diagnosis present

## 2021-04-28 DIAGNOSIS — K7581 Nonalcoholic steatohepatitis (NASH): Secondary | ICD-10-CM | POA: Diagnosis present

## 2021-04-28 DIAGNOSIS — Z86718 Personal history of other venous thrombosis and embolism: Secondary | ICD-10-CM | POA: Diagnosis not present

## 2021-04-28 DIAGNOSIS — Z823 Family history of stroke: Secondary | ICD-10-CM | POA: Diagnosis not present

## 2021-04-28 LAB — COMPREHENSIVE METABOLIC PANEL
ALT: 153 U/L — ABNORMAL HIGH (ref 0–44)
AST: 288 U/L — ABNORMAL HIGH (ref 15–41)
Albumin: 2 g/dL — ABNORMAL LOW (ref 3.5–5.0)
Alkaline Phosphatase: 238 U/L — ABNORMAL HIGH (ref 38–126)
Anion gap: 13 (ref 5–15)
BUN: 92 mg/dL — ABNORMAL HIGH (ref 8–23)
CO2: 15 mmol/L — ABNORMAL LOW (ref 22–32)
Calcium: 8.4 mg/dL — ABNORMAL LOW (ref 8.9–10.3)
Chloride: 110 mmol/L (ref 98–111)
Creatinine, Ser: 2.23 mg/dL — ABNORMAL HIGH (ref 0.61–1.24)
GFR, Estimated: 31 mL/min — ABNORMAL LOW (ref >60)
Glucose, Bld: 85 mg/dL (ref 70–99)
Potassium: 4.1 mmol/L (ref 3.5–5.1)
Sodium: 138 mmol/L (ref 135–145)
Total Bilirubin: 13.3 mg/dL — ABNORMAL HIGH (ref 0.3–1.2)
Total Protein: 6.3 g/dL — ABNORMAL LOW (ref 6.5–8.1)

## 2021-04-28 LAB — CBC
HCT: 29.7 % — ABNORMAL LOW (ref 39.0–52.0)
Hemoglobin: 10.5 g/dL — ABNORMAL LOW (ref 13.0–17.0)
MCH: 35.2 pg — ABNORMAL HIGH (ref 26.0–34.0)
MCHC: 35.4 g/dL (ref 30.0–36.0)
MCV: 99.7 fL (ref 80.0–100.0)
Platelets: 193 10*3/uL (ref 150–400)
RBC: 2.98 MIL/uL — ABNORMAL LOW (ref 4.22–5.81)
RDW: 20 % — ABNORMAL HIGH (ref 11.5–15.5)
WBC: 8.4 10*3/uL (ref 4.0–10.5)
nRBC: 0 % (ref 0.0–0.2)

## 2021-04-28 LAB — BILIRUBIN, DIRECT: Bilirubin, Direct: 7.2 mg/dL — ABNORMAL HIGH (ref 0.0–0.2)

## 2021-04-28 LAB — LIPASE, BLOOD: Lipase: 130 U/L — ABNORMAL HIGH (ref 11–51)

## 2021-04-28 LAB — PROTIME-INR
INR: 8.1 (ref 0.8–1.2)
Prothrombin Time: 67.3 seconds — ABNORMAL HIGH (ref 11.4–15.2)

## 2021-04-28 LAB — RESP PANEL BY RT-PCR (FLU A&B, COVID) ARPGX2
Influenza A by PCR: NEGATIVE
Influenza B by PCR: NEGATIVE
SARS Coronavirus 2 by RT PCR: NEGATIVE

## 2021-04-28 LAB — APTT: aPTT: 67 seconds — ABNORMAL HIGH (ref 24–36)

## 2021-04-28 LAB — AMMONIA: Ammonia: 100 umol/L — ABNORMAL HIGH (ref 9–35)

## 2021-04-28 LAB — GLUCOSE, CAPILLARY: Glucose-Capillary: 123 mg/dL — ABNORMAL HIGH (ref 70–99)

## 2021-04-28 MED ORDER — VITAMIN K1 10 MG/ML IJ SOLN
10.0000 mg | Freq: Once | INTRAMUSCULAR | Status: AC
  Administered 2021-04-28: 10 mg via SUBCUTANEOUS
  Filled 2021-04-28: qty 1

## 2021-04-28 MED ORDER — MECLIZINE HCL 25 MG PO TABS
25.0000 mg | ORAL_TABLET | Freq: Three times a day (TID) | ORAL | Status: DC
  Administered 2021-04-29 – 2021-05-03 (×12): 25 mg via ORAL
  Filled 2021-04-28 (×15): qty 1

## 2021-04-28 MED ORDER — LACTULOSE 10 GM/15ML PO SOLN
20.0000 g | Freq: Three times a day (TID) | ORAL | Status: DC
  Administered 2021-04-29: 20 g via ORAL
  Filled 2021-04-28: qty 30

## 2021-04-28 MED ORDER — SODIUM CHLORIDE 0.9 % IV SOLN
2.0000 g | Freq: Once | INTRAVENOUS | Status: AC
  Administered 2021-04-28: 2 g via INTRAVENOUS
  Filled 2021-04-28: qty 20

## 2021-04-28 MED ORDER — CHOLESTYRAMINE LIGHT 4 G PO PACK
4.0000 g | PACK | Freq: Two times a day (BID) | ORAL | Status: DC
  Filled 2021-04-28 (×10): qty 1

## 2021-04-28 MED ORDER — FUROSEMIDE 20 MG PO TABS
20.0000 mg | ORAL_TABLET | Freq: Every day | ORAL | Status: DC
  Administered 2021-04-29 – 2021-05-03 (×4): 20 mg via ORAL
  Filled 2021-04-28 (×5): qty 1

## 2021-04-28 MED ORDER — INSULIN ASPART 100 UNIT/ML IJ SOLN
0.0000 [IU] | Freq: Every day | INTRAMUSCULAR | Status: DC
  Administered 2021-04-29 – 2021-05-01 (×2): 2 [IU] via SUBCUTANEOUS
  Filled 2021-04-28 (×2): qty 1

## 2021-04-28 MED ORDER — LORATADINE 10 MG PO TABS
10.0000 mg | ORAL_TABLET | Freq: Every day | ORAL | Status: DC
  Administered 2021-04-29 – 2021-05-02 (×4): 10 mg via ORAL
  Filled 2021-04-28 (×4): qty 1

## 2021-04-28 MED ORDER — RIFAXIMIN 550 MG PO TABS
550.0000 mg | ORAL_TABLET | Freq: Two times a day (BID) | ORAL | Status: DC
  Administered 2021-04-29 – 2021-05-03 (×8): 550 mg via ORAL
  Filled 2021-04-28 (×11): qty 1

## 2021-04-28 MED ORDER — ISOSORBIDE MONONITRATE ER 30 MG PO TB24
60.0000 mg | ORAL_TABLET | Freq: Every day | ORAL | Status: DC
  Administered 2021-04-29: 60 mg via ORAL
  Filled 2021-04-28: qty 2

## 2021-04-28 MED ORDER — ONDANSETRON HCL 4 MG/2ML IJ SOLN
4.0000 mg | Freq: Once | INTRAMUSCULAR | Status: AC
  Administered 2021-04-28: 4 mg via INTRAVENOUS
  Filled 2021-04-28: qty 2

## 2021-04-28 MED ORDER — SPIRONOLACTONE 25 MG PO TABS
25.0000 mg | ORAL_TABLET | Freq: Every day | ORAL | Status: DC
  Administered 2021-04-29 – 2021-05-03 (×5): 25 mg via ORAL
  Filled 2021-04-28 (×5): qty 1

## 2021-04-28 MED ORDER — ISOSORBIDE MONONITRATE ER 30 MG PO TB24
90.0000 mg | ORAL_TABLET | ORAL | Status: DC
  Administered 2021-05-02: 90 mg via ORAL
  Filled 2021-04-28 (×2): qty 3

## 2021-04-28 MED ORDER — ONDANSETRON HCL 4 MG PO TABS
4.0000 mg | ORAL_TABLET | Freq: Four times a day (QID) | ORAL | Status: DC | PRN
  Administered 2021-05-03: 4 mg via ORAL
  Filled 2021-04-28: qty 1

## 2021-04-28 MED ORDER — GLIPIZIDE 10 MG PO TABS
10.0000 mg | ORAL_TABLET | Freq: Every day | ORAL | Status: DC
  Filled 2021-04-28: qty 1

## 2021-04-28 MED ORDER — SODIUM CHLORIDE 0.9 % IV SOLN
2.0000 g | INTRAVENOUS | Status: DC
  Administered 2021-04-29: 2 g via INTRAVENOUS
  Filled 2021-04-28: qty 2
  Filled 2021-04-28: qty 20

## 2021-04-28 MED ORDER — TRIAMCINOLONE ACETONIDE 0.1 % EX CREA
1.0000 "application " | TOPICAL_CREAM | Freq: Two times a day (BID) | CUTANEOUS | Status: DC | PRN
  Administered 2021-04-29: 1 via TOPICAL
  Filled 2021-04-28 (×2): qty 15

## 2021-04-28 MED ORDER — METRONIDAZOLE 500 MG/100ML IV SOLN
500.0000 mg | Freq: Two times a day (BID) | INTRAVENOUS | Status: DC
  Administered 2021-04-29: 500 mg via INTRAVENOUS
  Filled 2021-04-28 (×2): qty 100

## 2021-04-28 MED ORDER — PRAMOXINE-HC 1-1 % EX FOAM
1.0000 g | Freq: Four times a day (QID) | CUTANEOUS | Status: DC | PRN
  Filled 2021-04-28: qty 10

## 2021-04-28 MED ORDER — APIXABAN 5 MG PO TABS: 5.0000 mg | ORAL_TABLET | Freq: Two times a day (BID) | ORAL | Status: DC

## 2021-04-28 MED ORDER — ONDANSETRON HCL 4 MG/2ML IJ SOLN: 4.0000 mg | Freq: Four times a day (QID) | INTRAMUSCULAR | Status: DC | PRN

## 2021-04-28 MED ORDER — GLUCERNA SHAKE PO LIQD
237.0000 mL | Freq: Two times a day (BID) | ORAL | Status: DC
  Administered 2021-04-29 – 2021-05-03 (×10): 237 mL via ORAL

## 2021-04-28 MED ORDER — CARVEDILOL 6.25 MG PO TABS
3.1250 mg | ORAL_TABLET | Freq: Two times a day (BID) | ORAL | Status: DC
  Administered 2021-04-29 – 2021-05-01 (×6): 3.125 mg via ORAL
  Filled 2021-04-28 (×7): qty 1

## 2021-04-28 MED ORDER — INSULIN ASPART 100 UNIT/ML IJ SOLN
0.0000 [IU] | Freq: Three times a day (TID) | INTRAMUSCULAR | Status: DC
  Administered 2021-04-29 (×2): 3 [IU] via SUBCUTANEOUS
  Administered 2021-04-30 (×2): 5 [IU] via SUBCUTANEOUS
  Administered 2021-04-30: 3 [IU] via SUBCUTANEOUS
  Administered 2021-05-01: 8 [IU] via SUBCUTANEOUS
  Administered 2021-05-01: 2 [IU] via SUBCUTANEOUS
  Administered 2021-05-01: 3 [IU] via SUBCUTANEOUS
  Filled 2021-04-28 (×7): qty 1

## 2021-04-28 MED ORDER — SODIUM CHLORIDE 0.9 % IV SOLN
INTRAVENOUS | Status: DC | PRN
Start: 2021-04-28 — End: 2021-05-03

## 2021-04-28 MED ORDER — HYDROXYZINE HCL 25 MG PO TABS
25.0000 mg | ORAL_TABLET | Freq: Three times a day (TID) | ORAL | Status: DC | PRN
  Administered 2021-04-29 – 2021-05-03 (×5): 25 mg via ORAL
  Filled 2021-04-28 (×5): qty 1

## 2021-04-28 MED ORDER — LORATADINE 10 MG PO TABS
5.0000 mg | ORAL_TABLET | Freq: Every day | ORAL | Status: DC
  Administered 2021-04-29 – 2021-05-03 (×5): 5 mg via ORAL
  Filled 2021-04-28 (×5): qty 1

## 2021-04-28 MED ORDER — METRONIDAZOLE 500 MG/100ML IV SOLN
500.0000 mg | Freq: Once | INTRAVENOUS | Status: AC
  Administered 2021-04-28: 500 mg via INTRAVENOUS
  Filled 2021-04-28: qty 100

## 2021-04-28 MED ORDER — PANTOPRAZOLE SODIUM 40 MG PO TBEC
40.0000 mg | DELAYED_RELEASE_TABLET | Freq: Two times a day (BID) | ORAL | Status: DC
  Administered 2021-04-29 – 2021-05-03 (×9): 40 mg via ORAL
  Filled 2021-04-28 (×6): qty 1

## 2021-04-28 MED ORDER — ACETAMINOPHEN 325 MG PO TABS
975.0000 mg | ORAL_TABLET | Freq: Four times a day (QID) | ORAL | Status: DC | PRN
  Administered 2021-05-01 – 2021-05-02 (×2): 975 mg via ORAL
  Filled 2021-04-28 (×2): qty 3

## 2021-04-28 MED ORDER — CAMPHOR-MENTHOL 0.5-0.5 % EX LOTN
TOPICAL_LOTION | Freq: Two times a day (BID) | CUTANEOUS | Status: DC
  Filled 2021-04-28: qty 222

## 2021-04-28 MED ORDER — CIPROFLOXACIN IN D5W 400 MG/200ML IV SOLN: 400.0000 mg | INTRAVENOUS | Status: DC

## 2021-04-28 NOTE — ED Notes (Signed)
Pt to 2nd floor with transport.

## 2021-04-28 NOTE — ED Notes (Signed)
Pt presents to ED with c/o of persistent ABD pain. Pt states recently seen here and had to have a parenthesis and had 3L drained off last time he was here. Pt denies chest pain or SOB at this time.   Pt does appear yellow in color at this time, pt states this is has been his normal.

## 2021-04-28 NOTE — ED Triage Notes (Addendum)
Pt comes with c/o abdominal pain form [facility]. Pt appears yellow. Pt states pain for few days. EMS reported fluid drawn off on Friday of 3L.  Pt's abdomen is distended and hard.

## 2021-04-28 NOTE — ED Provider Notes (Signed)
[facility] Emergency Department Provider Note   ____________________________________________   Event Date/Time   First MD Initiated Contact with Patient 04/28/21 1437     (approximate)  I have reviewed the triage vital signs and the nursing notes.   HISTORY  Chief Complaint Abdominal Pain    HPI Jerry [name] is a 70 y.o. male with a past medical history of CAD, diabetes, gastric reflux, hypertension, hyperlipidemia, liver failure, presents to the emergency department for abdominal distention and discomfort.  He had a paracentesis with withdrawal of 3 L of fluid 2 days ago.  He returns today with his abdomen distended and tender.  He is jaundiced.  He is asking him if he can have a permanent catheter placed to drain the fluid.  His pain is achy and diffuse.  It is made worse by palpation.  Is been going on the last day or so.  It similar to what he had previously.         Past Medical History:  Diagnosis Date   Arthritis    Coronary artery disease    Diabetes mellitus without complication (HCC)    GERD (gastroesophageal reflux disease)    food related only with spicy only   Headache    h/o as a child-migraines   History of appendectomy    History of hiatal hernia    Hx of heart artery stent    Hypercholesteremia    Hypertension    Liver lesion    Primary cancer of kidney or ureter    Kidney cancer with partial removal of left kidney   PTSD (post-traumatic stress disorder)    Thrombosis    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    Patient Active Problem List   Diagnosis Date Noted   Malnutrition of moderate degree 04/05/2021   Decompensated liver disease ([place])    Hyponatremia    AKI (acute kidney injury) ([place])    Transaminitis 04/02/2021   Jaundice 04/02/2021   Choledocholithiasis with obstruction 04/02/2021   Leukocytosis 04/02/2021   GERD (gastroesophageal reflux disease) 04/02/2021   Portal hypertension (HCC)     Choledocholithiasis    Elevated liver enzymes    Status post reverse arthroplasty of shoulder, 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   Chest pain due to CAD ([place]) 03/27/2017   Upper GI bleed    S/P coronary artery stent placement    Coronary artery disease involving native coronary artery of native heart with angina pectoris ([place])    Chest pain 03/19/2017   Liver cirrhosis secondary to NASH ([place]) 03/19/2017   Portal vein thrombosis 01/21/2016   Type 2 diabetes mellitus without complication, without long-term current use of insulin ([place]) 01/21/2016   Coronary artery disease due to lipid rich plaque 01/21/2016   Essential hypertension     Past Surgical History:  Procedure Laterality Date   APPENDECTOMY     age 52   CARPAL TUNNEL RELEASE Right    CORONARY STENT INTERVENTION  03/23/2017   STENT SYNERGY DES 2.22L79 drug eluting stent was successfully placed   CORONARY STENT INTERVENTION N/A 03/23/2017   Procedure: CORONARY STENT INTERVENTION;  Surgeon: Jettie Booze, MD;  Location: [place] [facility];  Service: Cardiovascular;  Laterality: N/A;   ERCP N/A 04/03/2021   Procedure: ENDOSCOPIC RETROGRADE CHOLANGIOPANCREATOGRAPHY (ERCP);  Surgeon: Milus Banister, MD;  Location: [facility] ENDOSCOPY;  Service: Endoscopy;  Laterality: N/A;   HAND SURGERY     IR PARACENTESIS  04/11/2021   LEFT HEART CATH  AND CORONARY ANGIOGRAPHY N/A 03/23/2017   Procedure: LEFT HEART CATH AND CORONARY ANGIOGRAPHY;  Surgeon: Jettie Booze, MD;  Location: [place] [facility];  Service: Cardiovascular;  Laterality: N/A;   PARACENTESIS     PARTIAL NEPHRECTOMY Left    PILONIDAL CYST EXCISION     REVERSE SHOULDER ARTHROPLASTY Left 09/01/2019   Procedure: REVERSE SHOULDER ARTHROPLASTY;  Surgeon: Corky Mull, MD;  Location: [facility] ORS;  Service: Orthopedics;  Laterality: Left;   SHOULDER SURGERY Left    x4   SPHINCTEROTOMY  04/03/2021   Procedure: SPHINCTEROTOMY;  Surgeon: Milus Banister, MD;  Location: [facility] ENDOSCOPY;  Service:  Endoscopy;;  balloon sweep no stones   STENT PLACEMENT VASCULAR ([place] HX)      Prior to Admission medications   Medication Sig Start Date End Date Taking? Authorizing Provider  acetaminophen (TYLENOL) 325 MG tablet Take 975 mg by mouth in the morning and at bedtime.    [provider]  apixaban (ELIQUIS) 5 MG TABS tablet Take 1 tablet (5 mg total) by mouth 2 (two) times daily. 04/12/21   Kayleen Memos, DO  camphor-menthol [facility]) lotion Apply topically 2 (two) times daily. Patient not taking: Reported on 04/26/2021 04/12/21   Kayleen Memos, DO  carvedilol (COREG) 3.125 MG tablet Take 3.125 mg by mouth 2 (two) times daily. 10/31/20   [provider]  cetirizine (ZYRTEC) 5 MG tablet Take 5 mg by mouth every morning. 03/06/21   [provider]  cholestyramine light (PREVALITE) 4 g packet Take 1 packet (4 g total) by mouth 2 times daily at 12 noon and 4 pm. 04/12/21 05/12/21  Kayleen Memos, DO  ciprofloxacin (CIPRO) 500 MG tablet Take 500 mg by mouth 2 (two) times daily.    [provider]  feeding supplement, GLUCERNA SHAKE, (GLUCERNA SHAKE) LIQD Take 237 mLs by mouth 2 (two) times daily between meals.    [provider]  furosemide (LASIX) 20 MG tablet Take 1 tablet (20 mg total) by mouth daily. 04/12/21   Kayleen Memos, DO  glipiZIDE (GLUCOTROL) 10 MG tablet Take 1 tablet (10 mg total) by mouth daily before breakfast. 04/12/21   Kayleen Memos, DO  hydrOXYzine (ATARAX/VISTARIL) 25 MG tablet Take 1 tablet (25 mg total) by mouth 3 (three) times daily as needed for itching or anxiety. Patient not taking: Reported on 04/26/2021 04/12/21   Kayleen Memos, DO  isosorbide mononitrate (IMDUR) 30 MG 24 hr tablet Take 90 mg by mouth every morning. 03/05/21   [provider]  isosorbide mononitrate (IMDUR) 60 MG 24 hr tablet Take 60 mg by mouth daily. 04/11/21   [provider]  lactulose (CHRONULAC) 10 GM/15ML solution Take 30 mLs (20 g total) by  mouth 3 (three) times daily. 04/12/21   Kayleen Memos, DO  loratadine (CLARITIN) 10 MG tablet Take 10 mg by mouth at bedtime. 03/06/21   [provider]  meclizine (ANTIVERT) 25 MG tablet Take 1 tablet (25 mg total) by mouth 3 (three) times daily. 04/12/21   Kayleen Memos, DO  metFORMIN (GLUCOPHAGE-XR) 500 MG 24 hr tablet Take 500 mg by mouth in the morning and at bedtime.    [provider]  pantoprazole (PROTONIX) 40 MG tablet Take 1 tablet (40 mg total) by mouth 2 (two) times daily. 04/10/21 05/10/21  Amin, Jeanella Flattery, MD  polyethylene glycol (MIRALAX / GLYCOLAX) 17 g packet Take 8.5 g by mouth daily as needed for mild constipation. Patient not  taking: Reported on 04/26/2021    [provider]  pramoxine-hydrocortisone 1-1 % foam Apply 1 g topically 4 (four) times daily as needed (itching). Patient not taking: Reported on 04/26/2021    [provider]  rifaximin (XIFAXAN) 550 MG TABS tablet Take 1 tablet (550 mg total) by mouth 2 (two) times daily. 04/12/21 05/12/21  Kayleen Memos, DO  spironolactone (ALDACTONE) 25 MG tablet Take 25 mg by mouth daily. Patient not taking: Reported on 04/26/2021 04/25/21   [provider]  triamcinolone cream (KENALOG) 0.1 % Apply 1 application topically 2 (two) times daily as needed (itching). Patient not taking: Reported on 04/26/2021    [provider]    Allergies Atorvastatin, Metoprolol, Penicillins, Rosuvastatin, Simvastatin, and Terazosin  Family History  Problem Relation Age of Onset   Stroke Mother    Heart attack Mother    Stroke Father    Heart attack Father    Lung cancer Brother     Social History Social History   Tobacco Use   Smoking status: Never   Smokeless tobacco: Never  Vaping Use   Vaping Use: Never used  Substance Use Topics   Alcohol use: No   Drug use: No    Review of Systems  Constitutional: No fever/chills Eyes: No visual changes. ENT: No sore  throat. Cardiovascular: Denies chest pain. Respiratory: Denies shortness of breath. Gastrointestinal: No abdominal pain.  No nausea, no vomiting.  No diarrhea.  No constipation. Genitourinary: Negative for dysuria. Musculoskeletal: Negative for back pain. Skin: Negative for rash. Neurological: Negative for headaches, focal weakness  ____________________________________________   PHYSICAL EXAM:  VITAL SIGNS: ED Triage Vitals  Enc Vitals Group     BP 04/28/21 1156 119/66     Pulse Rate 04/28/21 1156 65     Resp 04/28/21 1156 18     Temp 04/28/21 1156 98 F (36.7 C)     Temp src --      SpO2 04/28/21 1156 99 %     Weight --      Height --      Head Circumference --      Peak Flow --      Pain Score 04/28/21 1154 6     Pain Loc --      Pain Edu? --      Excl. in [place]? --     Constitutional: Alert and oriented. Well appearing and in no acute distress. Eyes: Conjunctivae are jaunticed Head: Atraumatic. Nose: No congestion/rhinnorhea. Mouth/Throat: Mucous membranes are moist.  Oropharynx non-erythematous. Neck: No stridor.   Cardiovascular: Normal rate, regular rhythm. Grossly normal heart sounds.  Good peripheral circulation. Respiratory: Normal respiratory effort.  No retractions. Lungs CTAB. Gastrointestinal: Soft diffusely tender. distention. No abdominal bruits.  Musculoskeletal: No lower extremity tenderness nor edema.   Neurologic:  Normal speech and language. No gross focal neurologic deficits are appreciated.  Skin:  Skin is warm, dry and intact. No rash noted.  Jaundiced   ____________________________________________   LABS (all labs ordered are listed, but only abnormal results are displayed)  Labs Reviewed  LIPASE, BLOOD - Abnormal; Notable for the following components:      Result Value   Lipase 130 (*)    All other components within normal limits  COMPREHENSIVE METABOLIC PANEL - Abnormal; Notable for the following components:   CO2 15 (*)    BUN 92 (*)     Creatinine, Ser 2.23 (*)    Calcium 8.4 (*)    Total Protein 6.3 (*)  Albumin 2.0 (*)    AST 288 (*)    ALT 153 (*)    Alkaline Phosphatase 238 (*)    Total Bilirubin 13.3 (*)    GFR, Estimated 31 (*)    All other components within normal limits  CBC - Abnormal; Notable for the following components:   RBC 2.98 (*)    Hemoglobin 10.5 (*)    HCT 29.7 (*)    MCH 35.2 (*)    RDW 20.0 (*)    All other components within normal limits  URINALYSIS, ROUTINE W REFLEX MICROSCOPIC  AMMONIA  APTT  PROTIME-INR   ____________________________________________  EKG   ____________________________________________  RADIOLOGY Gertha Calkin, personally viewed and evaluated these images (plain radiographs) as part of my medical decision making, as well as reviewing the written report by the radiologist.  ED MD interpretation:    Official radiology report(s): CT ABDOMEN PELVIS WO CONTRAST  Result Date: 04/28/2021 CLINICAL DATA:  Abdominal abscess/infection suspected. Elevated lipase. EXAM: CT ABDOMEN AND PELVIS WITHOUT CONTRAST TECHNIQUE: Multidetector CT imaging of the abdomen and pelvis was performed following the standard protocol without IV contrast. COMPARISON:  04/02/2021 FINDINGS: Lower chest: Lung bases are unremarkable. Heart size is normal. There is dense atherosclerotic calcification the coronary arteries. Hepatobiliary: Moderate, increased ascites. Liver is nodular consistent with cirrhosis. No focal liver lesion. The gallbladder is contracted. Layering calcified gallstones are present. There is new air within the gallbladder lumen and nondilated common bile duct. Gallbladder wall appears thickened but is not fully assessed on CT. Pancreas: Unremarkable. No pancreatic ductal dilatation or surrounding inflammatory changes. Spleen: Enlarged homogeneous spleen. Adrenals/Urinary Tract: RIGHT adrenal gland is normal. LEFT adrenal gland is normal. Coarse calcifications along the UPPER  pole of LEFT kidney, stable in appearance. RIGHT kidney is normal. No hydronephrosis. Ureters are unremarkable. Urinary bladder is distended. Stomach/Bowel: Stomach and small bowel loops are normal in appearance. There is mild diffuse thickening of the colonic wall. Average stool burden. No colonic mass. The appendix is not well seen. Vascular/Lymphatic: There is atherosclerotic calcification of the abdominal aorta not associated with aneurysms. No retroperitoneal or mesenteric adenopathy. Numerous venous collaterals identified in the LEFT UPPER QUADRANT and anterior mesentery consistent with portal venous hypertension. Reproductive: Prostatic enlargement. Other: Moderate ascites.  Ascites extends to the RIGHT hemiscrotum. Musculoskeletal: Degenerative changes in the lumbar spine. No acute abnormality. IMPRESSION: 1. Increased ascites. 2. Cirrhotic morphology of the liver.  Portal venous hypertension. 3. Cholelithiasis.  Suspect gallbladder wall thickening. 4. There is interval development of air within the biliary tree and gallbladder lumen, suspicious for emphysematous cholecystitis. Consider further evaluation with MRCP. 5. RIGHT inguinal hernia with ascites extending into the RIGHT hemiscrotum. 6.  Aortic atherosclerosis.  (ICD10-I70.0) 7. Prostatic enlargement. Electronically Signed   By: Nolon Nations M.D.   On: 04/28/2021 15:26    ____________________________________________   PROCEDURES  Procedure(s) performed (including Critical Care):  Procedures   ____________________________________________   INITIAL IMPRESSION / ASSESSMENT AND PLAN / ED COURSE  Discussed patient with GI.  GI recommends if he is refractory to diuretics he should probably get TIPS done and then if he is only for palliative care he can have a permanent catheter put into drain but that would just shorten his life.   ----------------------------------------- 3:47 PM on  04/28/2021 ----------------------------------------- I am signing this patient out to the oncoming physician.  We will see if we can get him admitted.  We cannot rule out spontaneous bacterial peritonitis at this time.  Additionally  he will need to have his coagulation studies returned and make sure there are no problems before he gets his tap done.       ____________________________________________   FINAL CLINICAL IMPRESSION(S) / ED DIAGNOSES  Final diagnoses:  Ascites of liver     ED Discharge Orders     None        Note:  This document was prepared using Dragon voice recognition software and may include unintentional dictation errors.    Nena Polio, MD 04/28/21 [phone]

## 2021-04-28 NOTE — ED Notes (Signed)
Pt assisted to toilet 

## 2021-04-28 NOTE — ED Notes (Signed)
Pt assisted it hospital bed, awaiting transport who stated "I have to do another transport and then I will be back."

## 2021-04-28 NOTE — H&P (Signed)
History and Physical   Jerry [name] YQM:[id number] DOB: 1951/02/26 DOA: 04/28/2021  Referring MD/NP/PA: Dr. Joni Fears  PCP: [facility]   Outpatient Specialists: None  Patient coming from: Home  Chief Complaint: Abdominal pain  HPI: Jerry [name] is a 70 y.o. male with medical history significant of liver cirrhosis, coronary artery disease, GERD, kidney cancer status post left nephrectomy, PTSD, hyperlipidemia, essential hypertension diabetes and GERD who presents to the ER with abdominal pain that was sudden periumbilical moving to the right upper quadrant.  Associated with some nausea but no vomiting.  Denies any fever or chills.  Patient also has significant ascites with involvement of his respiratory system.  Patient was seen and evaluated in the ER.  Pain was 7 out of 10.  No melena.  No radiation.  No bright red blood per rectum.  No hemoptysis or hematemesis.  Was found to have possible cholecystitis.  Associated with also significant ascites and coagulopathy with INR of more than 8.  Patient has decompensated cirrhosis.  He is however stable.  Abdominal pain has slowly improved.  At this point patient has been evaluated by surgery with recommendation for nonsurgical treatment with antibiotics, MRCP and further evaluation..  ED Course: Temperature 98.7, blood pressure 150/112, pulse 71 respiratory 20 and oxygen sat 97% on room air.  White count is 8.4 hemoglobin 10.5 platelets 193.  Sodium is 138 potassium 4.2 chloride 110 CO2 of 15 BUN 92 creatinine four 2.23 and calcium 8.4.  INR is 8.1 glucose 123.  Acute viral screen is negative.  Total bilirubin 7.2 ammonia 100.  CT abdomen pelvis shows increased ascites.  Also cirrhotic morphology of the liver with portal venous hypertension.  There is cholelithiasis with gallbladder wall thickening.  There is also air within the biliary tree and gallbladder lumen suspicious for emphysematous cholecystitis.  Patient has right inguinal  hernia with ascites also.  Surgery consulted and patient being admitted for further evaluation.  Review of Systems: As per HPI otherwise 10 point review of systems negative.    Past Medical History:  Diagnosis Date   Arthritis    Coronary artery disease    Diabetes mellitus without complication (HCC)    GERD (gastroesophageal reflux disease)    food related only with spicy only   Headache    h/o as a child-migraines   History of appendectomy    History of hiatal hernia    Hx of heart artery stent    Hypercholesteremia    Hypertension    Liver lesion    Primary cancer of kidney or ureter    Kidney cancer with partial removal of left kidney   PTSD (post-traumatic stress disorder)    Thrombosis    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    Past Surgical History:  Procedure Laterality Date   APPENDECTOMY     age 70   CARPAL TUNNEL RELEASE Right    CORONARY STENT INTERVENTION  03/23/2017   STENT SYNERGY DES 5.28U13 drug eluting stent was successfully placed   CORONARY STENT INTERVENTION N/A 03/23/2017   Procedure: CORONARY STENT INTERVENTION;  Surgeon: Jettie Booze, MD;  Location: [place] [facility];  Service: Cardiovascular;  Laterality: N/A;   ERCP N/A 04/03/2021   Procedure: ENDOSCOPIC RETROGRADE CHOLANGIOPANCREATOGRAPHY (ERCP);  Surgeon: Milus Banister, MD;  Location: [facility];  Service: Endoscopy;  Laterality: N/A;   HAND SURGERY     IR PARACENTESIS  04/11/2021   LEFT HEART CATH AND CORONARY  ANGIOGRAPHY N/A 03/23/2017   Procedure: LEFT HEART CATH AND CORONARY ANGIOGRAPHY;  Surgeon: Jettie Booze, MD;  Location: [place] [facility];  Service: Cardiovascular;  Laterality: N/A;   PARACENTESIS     PARTIAL NEPHRECTOMY Left    PILONIDAL CYST EXCISION     REVERSE SHOULDER ARTHROPLASTY Left 09/01/2019   Procedure: REVERSE SHOULDER ARTHROPLASTY;  Surgeon: Corky Mull, MD;  Location: [facility] ORS;  Service: Orthopedics;  Laterality: Left;   SHOULDER  SURGERY Left    x4   SPHINCTEROTOMY  04/03/2021   Procedure: SPHINCTEROTOMY;  Surgeon: Milus Banister, MD;  Location: [facility] ENDOSCOPY;  Service: Endoscopy;;  balloon sweep no stones   STENT PLACEMENT VASCULAR ([place] [facility])       reports that he has never smoked. He has never used smokeless tobacco. He reports that he does not drink alcohol and does not use drugs.  Allergies  Allergen Reactions   Atorvastatin Other (See Comments)    Unknown   Metoprolol Other (See Comments)    bradycardia   Penicillins Swelling    Swelling at injection site Has patient had a PCN reaction causing immediate rash, facial/tongue/throat swelling, SOB or lightheadedness with hypotension: No Has patient had a PCN reaction causing severe rash involving mucus membranes or skin necrosis: No Has patient had a PCN reaction that required hospitalization No Has patient had a PCN reaction occurring within the last 10 years: No If all of the above answers are "NO", then may proceed with Cephalosporin use.   Rosuvastatin Other (See Comments)    unknown   Simvastatin Other (See Comments)    unknown   Terazosin Other (See Comments)    Orthostatic hypotension    Family History  Problem Relation Age of Onset   Stroke Mother    Heart attack Mother    Stroke Father    Heart attack Father    Lung cancer Brother      Prior to Admission medications   Medication Sig Start Date End Date Taking? Authorizing Provider  acetaminophen (TYLENOL) 325 MG tablet Take 975 mg by mouth in the morning and at bedtime.    [provider]  apixaban (ELIQUIS) 5 MG TABS tablet Take 1 tablet (5 mg total) by mouth 2 (two) times daily. 04/12/21   Kayleen Memos, DO  camphor-menthol [facility]) lotion Apply topically 2 (two) times daily. Patient not taking: Reported on 04/26/2021 04/12/21   Kayleen Memos, DO  carvedilol (COREG) 3.125 MG tablet Take 3.125 mg by mouth 2 (two) times daily. 10/31/20   [provider]  cetirizine  (ZYRTEC) 5 MG tablet Take 5 mg by mouth every morning. 03/06/21   [provider]  cholestyramine light (PREVALITE) 4 g packet Take 1 packet (4 g total) by mouth 2 times daily at 12 noon and 4 pm. 04/12/21 05/12/21  Kayleen Memos, DO  ciprofloxacin (CIPRO) 500 MG tablet Take 500 mg by mouth 2 (two) times daily.    [provider]  feeding supplement, GLUCERNA SHAKE, (GLUCERNA SHAKE) LIQD Take 237 mLs by mouth 2 (two) times daily between meals.    [provider]  furosemide (LASIX) 20 MG tablet Take 1 tablet (20 mg total) by mouth daily. 04/12/21   Kayleen Memos, DO  glipiZIDE (GLUCOTROL) 10 MG tablet Take 1 tablet (10 mg total) by mouth daily before breakfast. 04/12/21   Kayleen Memos, DO  hydrOXYzine (ATARAX/VISTARIL) 25 MG tablet Take 1 tablet (25 mg total) by mouth 3 (three) times  daily as needed for itching or anxiety. Patient not taking: Reported on 04/26/2021 04/12/21   Kayleen Memos, DO  isosorbide mononitrate (IMDUR) 30 MG 24 hr tablet Take 90 mg by mouth every morning. 03/05/21   [provider]  isosorbide mononitrate (IMDUR) 60 MG 24 hr tablet Take 60 mg by mouth daily. 04/11/21   [provider]  lactulose (CHRONULAC) 10 GM/15ML solution Take 30 mLs (20 g total) by mouth 3 (three) times daily. 04/12/21   Kayleen Memos, DO  loratadine (CLARITIN) 10 MG tablet Take 10 mg by mouth at bedtime. 03/06/21   [provider]  meclizine (ANTIVERT) 25 MG tablet Take 1 tablet (25 mg total) by mouth 3 (three) times daily. 04/12/21   Kayleen Memos, DO  metFORMIN (GLUCOPHAGE-XR) 500 MG 24 hr tablet Take 500 mg by mouth in the morning and at bedtime.    [provider]  pantoprazole (PROTONIX) 40 MG tablet Take 1 tablet (40 mg total) by mouth 2 (two) times daily. 04/10/21 05/10/21  Amin, Jeanella Flattery, MD  polyethylene glycol (MIRALAX / GLYCOLAX) 17 g packet Take 8.5 g by mouth daily as needed for mild constipation. Patient not taking: Reported on  04/26/2021    [provider]  pramoxine-hydrocortisone 1-1 % foam Apply 1 g topically 4 (four) times daily as needed (itching). Patient not taking: Reported on 04/26/2021    [provider]  rifaximin (XIFAXAN) 550 MG TABS tablet Take 1 tablet (550 mg total) by mouth 2 (two) times daily. 04/12/21 05/12/21  Kayleen Memos, DO  spironolactone (ALDACTONE) 25 MG tablet Take 25 mg by mouth daily. Patient not taking: Reported on 04/26/2021 04/25/21   [provider]  triamcinolone cream (KENALOG) 0.1 % Apply 1 application topically 2 (two) times daily as needed (itching). Patient not taking: Reported on 04/26/2021    [provider]    Physical Exam: Vitals:   04/28/21 2030 04/28/21 2032 04/28/21 2100 04/28/21 2155  BP: (!) 125/49  (!) 105/56 134/66  Pulse: 70  63 67  Resp:    20  Temp:  98.6 F (37 C)  98.7 F (37.1 C)  TempSrc:    Oral  SpO2: 99%  98% 100%      Constitutional: Chronically ill looking, mild anasarca and evidence of wasting Vitals:   04/28/21 2030 04/28/21 2032 04/28/21 2100 04/28/21 2155  BP: (!) 125/49  (!) 105/56 134/66  Pulse: 70  63 67  Resp:    20  Temp:  98.6 F (37 C)  98.7 F (37.1 C)  TempSrc:    Oral  SpO2: 99%  98% 100%   Eyes: PERRL, lids and conjunctivae Jaundiced ENMT: Mucous membranes are dry. Posterior pharynx clear of any exudate or lesions.Normal dentition.  Neck: normal, supple, no masses, no thyromegaly Respiratory: Decreased air entry at the bases with some crackles, no wheezing, no crackles. Normal respiratory effort. No accessory muscle use.  Cardiovascular: Sinus tachycardia, no murmurs / rubs / gallops.  1+ extremity edema. 2+ pedal pulses. No carotid bruits.  Abdomen: Distended, swelling with ascites, no tenderness, no masses palpated. No hepatosplenomegaly. Bowel sounds positive.  Musculoskeletal: no clubbing / cyanosis. No joint deformity upper and lower extremities. Good ROM, no contractures. Normal  muscle tone.  Skin: no rashes, lesions, ulcers. No induration Neurologic: CN 2-12 grossly intact. Sensation intact, DTR normal. Strength 5/5 in all 4.  Psychiatric: Normal judgment and insight. Alert and oriented x 3. Normal mood.     Labs  on Admission: I have personally reviewed following labs and imaging studies  CBC: Recent Labs  Lab 04/26/21 1124 04/28/21 1156  WBC 9.1 8.4  HGB 10.0* 10.5*  HCT 29.1* 29.7*  MCV 100.7* 99.7  PLT 187 244   Basic Metabolic Panel: Recent Labs  Lab 04/26/21 1124 04/28/21 1156  NA 136 138  K 4.4 4.1  CL 112* 110  CO2 16* 15*  GLUCOSE 64* 85  BUN 87* 92*  CREATININE 2.19* 2.23*  CALCIUM 8.7* 8.4*   GFR: CrCl cannot be calculated (Unknown ideal weight.). Liver Function Tests: Recent Labs  Lab 04/26/21 1124 04/28/21 1156  AST 239* 288*  ALT 134* 153*  ALKPHOS 227* 238*  BILITOT 12.7* 13.3*  PROT 6.1* 6.3*  ALBUMIN 1.9* 2.0*   Recent Labs  Lab 04/26/21 1124 04/28/21 1156  LIPASE 98* 130*   Recent Labs  Lab 04/26/21 1124 04/28/21 2205  AMMONIA 87* 100*   Coagulation Profile: Recent Labs  Lab 04/28/21 1454  INR 8.1*   Cardiac Enzymes: No results for input(s): CKTOTAL, CKMB, CKMBINDEX, TROPONINI in the last 168 hours. BNP (last 3 results) No results for input(s): PROBNP in the last 8760 hours. HbA1C: No results for input(s): HGBA1C in the last 72 hours. CBG: Recent Labs  Lab 04/28/21 2214  GLUCAP 123*   Lipid Profile: No results for input(s): CHOL, HDL, LDLCALC, TRIG, CHOLHDL, LDLDIRECT in the last 72 hours. Thyroid Function Tests: No results for input(s): TSH, T4TOTAL, FREET4, T3FREE, THYROIDAB in the last 72 hours. Anemia Panel: No results for input(s): VITAMINB12, FOLATE, FERRITIN, TIBC, IRON, RETICCTPCT in the last 72 hours. Urine analysis:    Component Value Date/Time   COLORURINE AMBER (A) 04/26/2021 1513   APPEARANCEUR CLEAR (A) 04/26/2021 1513   LABSPEC 1.015 04/26/2021 1513   PHURINE 5.0  04/26/2021 1513   GLUCOSEU NEGATIVE 04/26/2021 1513   HGBUR NEGATIVE 04/26/2021 1513   BILIRUBINUR SMALL (A) 04/26/2021 1513   KETONESUR NEGATIVE 04/26/2021 1513   PROTEINUR NEGATIVE 04/26/2021 1513   NITRITE NEGATIVE 04/26/2021 1513   LEUKOCYTESUR NEGATIVE 04/26/2021 1513   Sepsis Labs: @LABRCNTIP (procalcitonin:4,lacticidven:4) ) Recent Results (from the past 240 hour(s))  Resp Panel by RT-PCR (Flu A&B, Covid) Nasopharyngeal Swab     Status: None   Collection Time: 04/28/21  7:26 PM   Specimen: Nasopharyngeal Swab; Nasopharyngeal(NP) swabs in vial transport medium  Result Value Ref Range Status   SARS Coronavirus 2 by RT PCR NEGATIVE NEGATIVE Final    Comment: (NOTE) SARS-CoV-2 target nucleic acids are NOT DETECTED.  The SARS-CoV-2 RNA is generally detectable in upper respiratory specimens during the acute phase of infection. The lowest concentration of SARS-CoV-2 viral copies this assay can detect is 138 copies/mL. A negative result does not preclude SARS-Cov-2 infection and should not be used as the sole basis for treatment or other patient management decisions. A negative result may occur with  improper specimen collection/handling, submission of specimen other than nasopharyngeal swab, presence of viral mutation(s) within the areas targeted by this assay, and inadequate number of viral copies(<138 copies/mL). A negative result must be combined with clinical observations, patient history, and epidemiological information. The expected result is Negative.  Fact Sheet for Patients:  EntrepreneurPulse.com.au  Fact Sheet for Healthcare Providers:  IncredibleEmployment.be  This test is no t yet approved or cleared by the [place] FDA and  has been authorized for detection and/or diagnosis of SARS-CoV-2 by FDA under an Emergency Use Authorization (EUA). This EUA will remain  in effect (meaning this test  can be used) for the duration of  the COVID-19 declaration under Section 564(b)(1) of the Act, 21 U.S.C.section 360bbb-3(b)(1), unless the authorization is terminated  or revoked sooner.       Influenza A by PCR NEGATIVE NEGATIVE Final   Influenza B by PCR NEGATIVE NEGATIVE Final    Comment: (NOTE) The Xpert Xpress SARS-CoV-2/FLU/RSV plus assay is intended as an aid in the diagnosis of influenza from Nasopharyngeal swab specimens and should not be used as a sole basis for treatment. Nasal washings and aspirates are unacceptable for Xpert Xpress SARS-CoV-2/FLU/RSV testing.  Fact Sheet for Patients: EntrepreneurPulse.com.au  Fact Sheet for Healthcare Providers: IncredibleEmployment.be  This test is not yet approved or cleared by the [place] FDA and has been authorized for detection and/or diagnosis of SARS-CoV-2 by FDA under an Emergency Use Authorization (EUA). This EUA will remain in effect (meaning this test can be used) for the duration of the COVID-19 declaration under Section 564(b)(1) of the Act, 21 U.S.C. section 360bbb-3(b)(1), unless the authorization is terminated or revoked.  Performed at [facility], [address] [place], [place] [address]      Radiological Exams on Admission: CT ABDOMEN PELVIS WO CONTRAST  Result Date: 04/28/2021 CLINICAL DATA:  Abdominal abscess/infection suspected. Elevated lipase. EXAM: CT ABDOMEN AND PELVIS WITHOUT CONTRAST TECHNIQUE: Multidetector CT imaging of the abdomen and pelvis was performed following the standard protocol without IV contrast. COMPARISON:  04/02/2021 FINDINGS: Lower chest: Lung bases are unremarkable. Heart size is normal. There is dense atherosclerotic calcification the coronary arteries. Hepatobiliary: Moderate, increased ascites. Liver is nodular consistent with cirrhosis. No focal liver lesion. The gallbladder is contracted. Layering calcified gallstones are present. There is new air within the  gallbladder lumen and nondilated common bile duct. Gallbladder wall appears thickened but is not fully assessed on CT. Pancreas: Unremarkable. No pancreatic ductal dilatation or surrounding inflammatory changes. Spleen: Enlarged homogeneous spleen. Adrenals/Urinary Tract: RIGHT adrenal gland is normal. LEFT adrenal gland is normal. Coarse calcifications along the UPPER pole of LEFT kidney, stable in appearance. RIGHT kidney is normal. No hydronephrosis. Ureters are unremarkable. Urinary bladder is distended. Stomach/Bowel: Stomach and small bowel loops are normal in appearance. There is mild diffuse thickening of the colonic wall. Average stool burden. No colonic mass. The appendix is not well seen. Vascular/Lymphatic: There is atherosclerotic calcification of the abdominal aorta not associated with aneurysms. No retroperitoneal or mesenteric adenopathy. Numerous venous collaterals identified in the LEFT UPPER QUADRANT and anterior mesentery consistent with portal venous hypertension. Reproductive: Prostatic enlargement. Other: Moderate ascites.  Ascites extends to the RIGHT hemiscrotum. Musculoskeletal: Degenerative changes in the lumbar spine. No acute abnormality. IMPRESSION: 1. Increased ascites. 2. Cirrhotic morphology of the liver.  Portal venous hypertension. 3. Cholelithiasis.  Suspect gallbladder wall thickening. 4. There is interval development of air within the biliary tree and gallbladder lumen, suspicious for emphysematous cholecystitis. Consider further evaluation with MRCP. 5. RIGHT inguinal hernia with ascites extending into the RIGHT hemiscrotum. 6.  Aortic atherosclerosis.  (ICD10-I70.0) 7. Prostatic enlargement. Electronically Signed   By: Nolon Nations M.D.   On: 04/28/2021 15:26     Assessment/Plan Principal Problem:   Acute emphysematous cholecystitis Active Problems:   Type 2 diabetes mellitus without complication, without long-term current use of insulin (HCC)   Coronary artery  disease due to lipid rich plaque   Essential hypertension   Liver cirrhosis secondary to NASH [facility])   Coronary artery disease involving native coronary artery of native heart with angina pectoris ([place])  Portal hypertension (HCC)   GERD (gastroesophageal reflux disease)   Decompensated liver disease (HCC)   AKI (acute kidney injury) ([place])   Malnutrition of moderate degree   Coagulopathy ([place])     #1 acute emphysematous cholecystitis: Patient will be admitted and initiated on IV antibiotics to cover for anaerobes gram-negative's and gram-positive's.  Patient also has significant ascites.  Not a good candidate for surgical intervention.  MRCP will be ordered.  We will follow surgical recommendations.  Possible GI consult also.  #2 coagulopathy: Secondary to advanced liver disease.  Vitamin K is given.  May require FFP in the morning for paracentesis.  #3 decompensated cirrhosis with significant ascites: Patient will require paracentesis due to tense ascites.  Coagulopathy however will be the inhibiting factor.  We will order FFP in addition to the vitamin K in the morning hopefully patient will be able to go on with paracentesis.  #4 coronary artery disease: Compensated at this point.  #5 severe malnutrition: Continue dietary and nutritional consultation  #6 essential hypertension: We will resume home regimen once stable.  #7 GERD: Continue PPI  #8 AKI: Continue with close monitoring.  #9 anemia: Chronic disease.  Continue to monitor   DVT prophylaxis: SCD Code Status: DNR Family Communication: No family at bedside Disposition Plan: To be determined Consults called: Dr. Lysle Pearl, general surgery Admission status: Inpatient  Severity of Illness: The appropriate patient status for this patient is INPATIENT. Inpatient status is judged to be reasonable and necessary in order to provide the required intensity of service to ensure the patient's safety. The patient's presenting symptoms,  physical exam findings, and initial radiographic and laboratory data in the context of their chronic comorbidities is felt to place them at high risk for further clinical deterioration. Furthermore, it is not anticipated that the patient will be medically stable for discharge from the hospital within 2 midnights of admission.   * I certify that at the point of admission it is my clinical judgment that the patient will require inpatient hospital care spanning beyond 2 midnights from the point of admission due to high intensity of service, high risk for further deterioration and high frequency of surveillance required.Barbette Merino MD [facility] Pager [phone]  If 7PM-7AM, please contact night-coverage [email or web address] Password [email or web address]  04/28/2021, 11:33 PM

## 2021-04-28 NOTE — ED Triage Notes (Signed)
Pt arrived via [facility] from [facility], ems reports  fluid build up on abd. Here Friday and had 3L drawn off. Pt reported having fluid drawn off several time over last week. Ems reports pt a&o.   Ems vital wnl

## 2021-04-28 NOTE — Consult Note (Signed)
Subjective:   CC: Abdominal pain  HPI:  Jerry [name] is a 70 y.o. male who is consulted by [facility] for evaluation of above cc.  Symptoms were first noted 2 days ago. Pain is sharp, sudden onset, localized to periumbilical and right side, more right lower quadrant then right upper quadrant.  Associated with nothing specific, exacerbated by nothing specific.  Had similar episodes in the past that has resolved on its own.     Past Medical History:  has a past medical history of Arthritis, Coronary artery disease, Diabetes mellitus without complication ([place]), GERD (gastroesophageal reflux disease), Headache, History of appendectomy, History of hiatal hernia, heart artery stent, Hypercholesteremia, Hypertension, Liver lesion, Primary cancer of kidney or ureter, PTSD (post-traumatic stress disorder), and Thrombosis.  Past Surgical History:  has a past surgical history that includes Shoulder surgery (Left); STENT PLACEMENT VASCULAR ([place] HX); CORONARY STENT INTERVENTION (03/23/2017); LEFT HEART CATH AND CORONARY ANGIOGRAPHY (N/A, 03/23/2017); CORONARY STENT INTERVENTION (N/A, 03/23/2017); Partial nephrectomy (Left); Appendectomy; Pilonidal cyst excision; Hand surgery; Carpal tunnel release (Right); Reverse shoulder arthroplasty (Left, 09/01/2019); Paracentesis; ERCP (N/A, 04/03/2021); sphincterotomy (04/03/2021); and IR Paracentesis (04/11/2021).  Family History: family history includes Heart attack in his father and mother; Lung cancer in his brother; Stroke in his father and mother.  Social History:  reports that he has never smoked. He has never used smokeless tobacco. He reports that he does not drink alcohol and does not use drugs.  Current Medications:  Prior to Admission medications   Medication Sig Start Date End Date Taking? Authorizing Provider  acetaminophen (TYLENOL) 325 MG tablet Take 975 mg by mouth in the morning and at bedtime.   Yes [provider]  apixaban (ELIQUIS) 5 MG  TABS tablet Take 1 tablet (5 mg total) by mouth 2 (two) times daily. 04/12/21  Yes Hall, Lorenda Cahill, DO  camphor-menthol [facility]) lotion Apply topically 2 (two) times daily. 04/12/21  Yes Hall, Carole N, DO  carvedilol (COREG) 3.125 MG tablet Take 3.125 mg by mouth 2 (two) times daily. 10/31/20  Yes [provider]  cetirizine (ZYRTEC) 5 MG tablet Take 5 mg by mouth every morning. 03/06/21  Yes [provider]  cholestyramine light (PREVALITE) 4 g packet Take 1 packet (4 g total) by mouth 2 times daily at 12 noon and 4 pm. 04/12/21 05/12/21 Yes Kayleen Memos, DO  furosemide (LASIX) 20 MG tablet Take 1 tablet (20 mg total) by mouth daily. 04/12/21  Yes Hall, Carole N, DO  glipiZIDE (GLUCOTROL) 10 MG tablet Take 1 tablet (10 mg total) by mouth daily before breakfast. 04/12/21  Yes Kayleen Memos, DO  hydrOXYzine (ATARAX/VISTARIL) 25 MG tablet Take 1 tablet (25 mg total) by mouth 3 (three) times daily as needed for itching or anxiety. 04/12/21  Yes Kayleen Memos, DO  isosorbide mononitrate (IMDUR) 30 MG 24 hr tablet Take 90 mg by mouth every morning. 03/05/21  Yes [provider]  isosorbide mononitrate (IMDUR) 60 MG 24 hr tablet Take 60 mg by mouth daily. 04/11/21  Yes [provider]  lactulose (CHRONULAC) 10 GM/15ML solution Take 30 mLs (20 g total) by mouth 3 (three) times daily. 04/12/21  Yes Kayleen Memos, DO  meclizine (ANTIVERT) 25 MG tablet Take 1 tablet (25 mg total) by mouth 3 (three) times daily. 04/12/21  Yes Kayleen Memos, DO  metFORMIN (GLUCOPHAGE-XR) 500 MG 24 hr tablet Take 500 mg by mouth in the morning and at bedtime.   Yes [provider]  pantoprazole (PROTONIX) 40 MG tablet Take 1 tablet (40 mg total) by mouth 2 (two) times daily. 04/10/21 05/10/21 Yes Amin, Jeanella Flattery, MD  rifaximin (XIFAXAN) 550 MG TABS tablet Take 1 tablet (550 mg total) by mouth 2 (two) times daily. 04/12/21 05/12/21 Yes Kayleen Memos, DO  spironolactone (ALDACTONE) 25 MG tablet  Take 25 mg by mouth daily. 04/25/21  Yes [provider]  ciprofloxacin (CIPRO) 500 MG tablet Take 500 mg by mouth 2 (two) times daily.    [provider]  feeding supplement, GLUCERNA SHAKE, (GLUCERNA SHAKE) LIQD Take 237 mLs by mouth 2 (two) times daily between meals.    [provider]  loratadine (CLARITIN) 10 MG tablet Take 10 mg by mouth at bedtime. 03/06/21   [provider]  polyethylene glycol (MIRALAX / GLYCOLAX) 17 g packet Take 8.5 g by mouth daily as needed for mild constipation. Patient not taking: No sig reported    [provider]  pramoxine-hydrocortisone 1-1 % foam Apply 1 g topically 4 (four) times daily as needed (itching).    [provider]  triamcinolone cream (KENALOG) 0.1 % Apply 1 application topically 2 (two) times daily as needed (itching).    [provider]    Allergies:  Allergies as of 04/28/2021 - Review Complete 04/28/2021  Allergen Reaction Noted   Atorvastatin Other (See Comments) 06/17/2019   Metoprolol Other (See Comments) 03/19/2017   Penicillins Swelling 01/21/2016   Rosuvastatin Other (See Comments) 06/17/2019   Simvastatin Other (See Comments) 06/17/2019   Terazosin Other (See Comments) 03/19/2017    ROS:  General: Denies weight loss, weight gain, fatigue, fevers, chills, and night sweats. Eyes: Denies blurry vision, double vision, eye pain, itchy eyes, and tearing. Ears: Denies hearing loss, earache, and ringing in ears. Nose: Denies sinus pain, congestion, infections, runny nose, and nosebleeds. Mouth/throat: Denies hoarseness, sore throat, bleeding gums, and difficulty swallowing. Heart: Denies chest pain, palpitations, racing heart, irregular heartbeat, leg pain or swelling, and decreased activity tolerance. Respiratory: Denies breathing difficulty, shortness of breath, wheezing, cough, and sputum. GI: Denies change in appetite, heartburn, nausea, vomiting, constipation, diarrhea,  and blood in stool. GU: Denies difficulty urinating, pain with urinating, urgency, frequency, blood in urine. Musculoskeletal: Denies joint stiffness, pain, swelling, muscle weakness. Skin: Denies rash, itching, mass, tumors, sores, and boils Neurologic: Denies headache, fainting, dizziness, seizures, numbness, and tingling. Psychiatric: Denies depression, anxiety, difficulty sleeping, and memory loss. Endocrine: Denies heat or cold intolerance, and increased thirst or urination. Blood/lymph: Denies easy bruising, and swollen glands     Objective:     BP (!) 151/112   Pulse 71   Temp 98 F (36.7 C)   Resp 19   SpO2 98%    Constitutional :  alert, cooperative, appears stated age, icteric, and no distress  Lymphatics/Throat:  no asymmetry, masses, or scars  Respiratory:  clear to auscultation bilaterally  Cardiovascular:  regular rate and rhythm  Gastrointestinal: Soft, no guarding, tenderness to palpation in periumbilical region and right lower quadrant.  No tenderness to palpation in the right upper quadrant .   Musculoskeletal: Steady movement  Skin: Cool and moist, laparoscopic, and infraumbilical midline surgical scars  Psychiatric: Normal affect, non-agitated, not confused       LABS:  CMP Latest Ref Rng & Units 04/28/2021 04/26/2021 04/12/2021  Glucose 70 - 99 mg/dL 85 64(L) 176(H)  BUN 8 - 23 mg/dL 92(H) 87(H) 44(H)  Creatinine 0.61 - 1.24 mg/dL 2.23(H) 2.19(H) 1.58(H)  Sodium 135 - 145  mmol/L 138 136 131(L)  Potassium 3.5 - 5.1 mmol/L 4.1 4.4 5.4(H)  Chloride 98 - 111 mmol/L 110 112(H) 104  CO2 22 - 32 mmol/L 15(L) 16(L) 21(L)  Calcium 8.9 - 10.3 mg/dL 8.4(L) 8.7(L) 8.4(L)  Total Protein 6.5 - 8.1 g/dL 6.3(L) 6.1(L) 6.0(L)  Total Bilirubin 0.3 - 1.2 mg/dL 13.3(H) 12.7(H) 13.3(H)  Alkaline Phos 38 - 126 U/L 238(H) 227(H) 177(H)  AST 15 - 41 U/L 288(H) 239(H) 151(H)  ALT 0 - 44 U/L 153(H) 134(H) 101(H)   CBC Latest Ref Rng & Units 04/28/2021 04/26/2021 04/12/2021   WBC 4.0 - 10.5 K/uL 8.4 9.1 9.0  Hemoglobin 13.0 - 17.0 g/dL 10.5(L) 10.0(L) 9.8(L)  Hematocrit 39.0 - 52.0 % 29.7(L) 29.1(L) 28.2(L)  Platelets 150 - 400 K/uL 193 187 131(L)     RADS: CLINICAL DATA:  Abdominal abscess/infection suspected. Elevated lipase.   EXAM: CT ABDOMEN AND PELVIS WITHOUT CONTRAST   TECHNIQUE: Multidetector CT imaging of the abdomen and pelvis was performed following the standard protocol without IV contrast.   COMPARISON:  04/02/2021   FINDINGS: Lower chest: Lung bases are unremarkable. Heart size is normal. There is dense atherosclerotic calcification the coronary arteries.   Hepatobiliary: Moderate, increased ascites. Liver is nodular consistent with cirrhosis. No focal liver lesion. The gallbladder is contracted. Layering calcified gallstones are present. There is new air within the gallbladder lumen and nondilated common bile duct. Gallbladder wall appears thickened but is not fully assessed on CT.   Pancreas: Unremarkable. No pancreatic ductal dilatation or surrounding inflammatory changes.   Spleen: Enlarged homogeneous spleen.   Adrenals/Urinary Tract: RIGHT adrenal gland is normal. LEFT adrenal gland is normal.   Coarse calcifications along the UPPER pole of LEFT kidney, stable in appearance. RIGHT kidney is normal. No hydronephrosis. Ureters are unremarkable. Urinary bladder is distended.   Stomach/Bowel: Stomach and small bowel loops are normal in appearance. There is mild diffuse thickening of the colonic wall. Average stool burden. No colonic mass. The appendix is not well seen.   Vascular/Lymphatic: There is atherosclerotic calcification of the abdominal aorta not associated with aneurysms. No retroperitoneal or mesenteric adenopathy. Numerous venous collaterals identified in the LEFT UPPER QUADRANT and anterior mesentery consistent with portal venous hypertension.   Reproductive: Prostatic enlargement.   Other: Moderate  ascites.  Ascites extends to the RIGHT hemiscrotum.   Musculoskeletal: Degenerative changes in the lumbar spine. No acute abnormality.   IMPRESSION: 1. Increased ascites. 2. Cirrhotic morphology of the liver.  Portal venous hypertension. 3. Cholelithiasis.  Suspect gallbladder wall thickening. 4. There is interval development of air within the biliary tree and gallbladder lumen, suspicious for emphysematous cholecystitis. Consider further evaluation with MRCP. 5. RIGHT inguinal hernia with ascites extending into the RIGHT hemiscrotum. 6.  Aortic atherosclerosis.  (ICD10-I70.0) 7. Prostatic enlargement.     Electronically Signed   By: Nolon Nations M.D.   On: 04/28/2021 15:26   Assessment:      Abdominal pain History of cirrhosis Elevation of lipase Pneumobilia History of portal vein thrombosis on Eliquis  Plan:     Patient pain pattern and location is not consistent with gallbladder etiology although the pneumobilia is a new finding on the CT scan and is concerning.  Abnormal LFTs likely is from the cirrhosis but he does technically have direct hyperbilirubinemia with an increase in lipase.  We will recommend an MRCP to see if we can further delineate exact pathology of the biliary system to complete work-up of the pneumobilia.  Patient has not  endorsed any recent procedures cannulating the area nor is there any noted in the chart.  Further management regarding his recurrent ascites per the medical team.  Surgery will continue to follow for now, but he will not be a candidate for any sort of surgical intervention.

## 2021-04-28 NOTE — ED Notes (Signed)
Messaged IP rn for updated on when pt can be transferred.

## 2021-04-28 NOTE — ED Notes (Signed)
Pt 2 assist to bedside toilet to have a BM

## 2021-04-28 NOTE — ED Notes (Signed)
Pt had large light brown soft BM. Pt cleaned up by this RN and [employer]. Clean brief placed .

## 2021-04-29 ENCOUNTER — Inpatient Hospital Stay: Payer: No Typology Code available for payment source

## 2021-04-29 DIAGNOSIS — R188 Other ascites: Secondary | ICD-10-CM | POA: Diagnosis not present

## 2021-04-29 DIAGNOSIS — R748 Abnormal levels of other serum enzymes: Secondary | ICD-10-CM | POA: Diagnosis not present

## 2021-04-29 DIAGNOSIS — K810 Acute cholecystitis: Secondary | ICD-10-CM | POA: Diagnosis not present

## 2021-04-29 LAB — URINALYSIS, ROUTINE W REFLEX MICROSCOPIC
Bilirubin Urine: NEGATIVE
Glucose, UA: NEGATIVE mg/dL
Hgb urine dipstick: NEGATIVE
Ketones, ur: NEGATIVE mg/dL
Leukocytes,Ua: NEGATIVE
Nitrite: NEGATIVE
Protein, ur: NEGATIVE mg/dL
Specific Gravity, Urine: 1.011 (ref 1.005–1.030)
pH: 5 (ref 5.0–8.0)

## 2021-04-29 LAB — COMPREHENSIVE METABOLIC PANEL
ALT: 151 U/L — ABNORMAL HIGH (ref 0–44)
AST: 278 U/L — ABNORMAL HIGH (ref 15–41)
Albumin: 1.9 g/dL — ABNORMAL LOW (ref 3.5–5.0)
Alkaline Phosphatase: 205 U/L — ABNORMAL HIGH (ref 38–126)
Anion gap: 9 (ref 5–15)
BUN: 87 mg/dL — ABNORMAL HIGH (ref 8–23)
CO2: 16 mmol/L — ABNORMAL LOW (ref 22–32)
Calcium: 8.7 mg/dL — ABNORMAL LOW (ref 8.9–10.3)
Chloride: 117 mmol/L — ABNORMAL HIGH (ref 98–111)
Creatinine, Ser: 2.25 mg/dL — ABNORMAL HIGH (ref 0.61–1.24)
GFR, Estimated: 30 mL/min — ABNORMAL LOW (ref >60)
Glucose, Bld: 74 mg/dL (ref 70–99)
Potassium: 4.1 mmol/L (ref 3.5–5.1)
Sodium: 142 mmol/L (ref 135–145)
Total Bilirubin: 12.4 mg/dL — ABNORMAL HIGH (ref 0.3–1.2)
Total Protein: 6 g/dL — ABNORMAL LOW (ref 6.5–8.1)

## 2021-04-29 LAB — MRSA NEXT GEN BY PCR, NASAL: MRSA by PCR Next Gen: NOT DETECTED

## 2021-04-29 LAB — CBC
HCT: 29.6 % — ABNORMAL LOW (ref 39.0–52.0)
Hemoglobin: 10.3 g/dL — ABNORMAL LOW (ref 13.0–17.0)
MCH: 34.3 pg — ABNORMAL HIGH (ref 26.0–34.0)
MCHC: 34.8 g/dL (ref 30.0–36.0)
MCV: 98.7 fL (ref 80.0–100.0)
Platelets: 203 10*3/uL (ref 150–400)
RBC: 3 MIL/uL — ABNORMAL LOW (ref 4.22–5.81)
RDW: 20.2 % — ABNORMAL HIGH (ref 11.5–15.5)
WBC: 8.6 10*3/uL (ref 4.0–10.5)
nRBC: 0 % (ref 0.0–0.2)

## 2021-04-29 LAB — GLUCOSE, CAPILLARY
Glucose-Capillary: 69 mg/dL — ABNORMAL LOW (ref 70–99)
Glucose-Capillary: 108 mg/dL — ABNORMAL HIGH (ref 70–99)
Glucose-Capillary: 155 mg/dL — ABNORMAL HIGH (ref 70–99)
Glucose-Capillary: 179 mg/dL — ABNORMAL HIGH (ref 70–99)
Glucose-Capillary: 230 mg/dL — ABNORMAL HIGH (ref 70–99)

## 2021-04-29 LAB — PROTIME-INR
INR: 5.2 (ref 0.8–1.2)
Prothrombin Time: 48 seconds — ABNORMAL HIGH (ref 11.4–15.2)

## 2021-04-29 LAB — PATHOLOGIST SMEAR REVIEW

## 2021-04-29 LAB — BODY FLUID CELL COUNT WITH DIFFERENTIAL
Eos, Fluid: 0 %
Lymphs, Fluid: 35 %
Monocyte-Macrophage-Serous Fluid: 23 %
Neutrophil Count, Fluid: 42 %
Total Nucleated Cell Count, Fluid: 256 cu mm

## 2021-04-29 LAB — SODIUM, URINE, RANDOM: Sodium, Ur: 26 mmol/L

## 2021-04-29 MED ORDER — LACTULOSE 10 GM/15ML PO SOLN
10.0000 g | Freq: Two times a day (BID) | ORAL | Status: DC
  Filled 2021-04-29 (×3): qty 30

## 2021-04-29 MED ORDER — GADOBUTROL 1 MMOL/ML IV SOLN
8.0000 mL | Freq: Once | INTRAVENOUS | Status: AC | PRN
  Administered 2021-04-29: 8 mL via INTRAVENOUS

## 2021-04-29 MED ORDER — METRONIDAZOLE 500 MG PO TABS
500.0000 mg | ORAL_TABLET | Freq: Three times a day (TID) | ORAL | Status: DC
Start: 2021-04-29 — End: 2021-05-03
  Administered 2021-04-29 – 2021-05-03 (×12): 500 mg via ORAL
  Filled 2021-04-29 (×15): qty 1

## 2021-04-29 NOTE — Consult Note (Signed)
Lucilla Lame, MD [facility]  [address] [place], [place] [address] Phone: [phone] Fax : [phone]  Consultation  Referring Provider:     Dr. Priscella Mann Primary Care Physician:  [place] Primary Gastroenterologist:  [place]         Reason for Consultation:     Abnormal liver enzymes and abdominal pain  Date of Admission:  04/28/2021 Date of Consultation:  04/29/2021         HPI:   Jerry [name] is a 70 y.o. male who was recently in the hospital and underwent an ERCP for possible bile duct stone.  There was no stones found and a sphincterotomy was done at that time.  The patient's liver enzymes had not gone down since then indicating that there is no biliary obstruction as the cause of the patient's symptoms. 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  The patient has had recurrent ascites and has undergone multiple paracentesis.  [facility] consults it today due to the patient having an MRCP that showed A limited exam due to motion with a common bile duct dilated to 11 mm in the porta hepatis with suggestion of possible debris and/or small gallstone in the common bile duct however the lack of intrahepatic ductal dilatation suggesting against frank obstruction as per radiology. The patient had 3.2 L of fluid removed with his paracentesis today.  The patient's fluid was not consistent with SBP. The patient came in with an INR of 8.1 that is down to 5.2 today. He reports that he has been having a lot of diarrhea since starting lactulose.  The patient is also on Xifaxan.   Past Medical History:  Diagnosis Date  . Arthritis   . Coronary artery disease   . Diabetes mellitus without complication ([place])   . GERD (gastroesophageal reflux disease)    food related only with spicy only  . Headache    h/o as a child-migraines  . History of appendectomy   . History of  hiatal hernia   . Hx of heart artery stent   . Hypercholesteremia   . Hypertension   . Liver lesion   . 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 PTSD (post-traumatic stress disorder)   . Thrombosis    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    Past Surgical History:  Procedure Laterality Date  . APPENDECTOMY     age 68  . CARPAL TUNNEL RELEASE Right   . CORONARY STENT INTERVENTION  03/23/2017   STENT SYNERGY DES 2.75X28 drug eluting stent was successfully placed  . CORONARY STENT INTERVENTION N/A 03/23/2017   Procedure: CORONARY STENT INTERVENTION;  Surgeon: Jettie Booze, MD;  Location: [place] [facility];  Service: Cardiovascular;  Laterality: N/A;  . ERCP N/A 04/03/2021   Procedure: ENDOSCOPIC RETROGRADE CHOLANGIOPANCREATOGRAPHY (ERCP);  Surgeon: Milus Banister, MD;  Location: [facility] ENDOSCOPY;  Service: Endoscopy;  Laterality: N/A;  . HAND SURGERY    . IR PARACENTESIS  04/11/2021  . LEFT HEART CATH AND CORONARY ANGIOGRAPHY N/A 03/23/2017   Procedure: LEFT HEART CATH AND CORONARY ANGIOGRAPHY;  Surgeon: Jettie Booze, MD;  Location: [place] [facility];  Service: Cardiovascular;  Laterality: N/A;  . PARACENTESIS    . PARTIAL NEPHRECTOMY Left   . PILONIDAL CYST EXCISION    .  REVERSE SHOULDER ARTHROPLASTY Left 09/01/2019   Procedure: REVERSE SHOULDER ARTHROPLASTY;  Surgeon: Corky Mull, MD;  Location: [facility] ORS;  Service: Orthopedics;  Laterality: Left;  . SHOULDER SURGERY Left    x4  . SPHINCTEROTOMY  04/03/2021   Procedure: SPHINCTEROTOMY;  Surgeon: Milus Banister, MD;  Location: [facility] ENDOSCOPY;  Service: Endoscopy;;  balloon sweep no stones  . STENT PLACEMENT VASCULAR ([place] HX)      Prior to Admission medications   Medication Sig Start Date End Date Taking? Authorizing Provider  acetaminophen (TYLENOL) 325 MG tablet Take 975 mg by mouth in the morning and at bedtime.   Yes [provider]   apixaban (ELIQUIS) 5 MG TABS tablet Take 1 tablet (5 mg total) by mouth 2 (two) times daily. 04/12/21  Yes Hall, Lorenda Cahill, DO  camphor-menthol [facility]) lotion Apply topically 2 (two) times daily. 04/12/21  Yes Hall, Carole N, DO  carvedilol (COREG) 3.125 MG tablet Take 3.125 mg by mouth 2 (two) times daily. 10/31/20  Yes [provider]  cetirizine (ZYRTEC) 5 MG tablet Take 5 mg by mouth every morning. 03/06/21  Yes [provider]  cholestyramine light (PREVALITE) 4 g packet Take 1 packet (4 g total) by mouth 2 times daily at 12 noon and 4 pm. 04/12/21 05/12/21 Yes Kayleen Memos, DO  furosemide (LASIX) 20 MG tablet Take 1 tablet (20 mg total) by mouth daily. 04/12/21  Yes Hall, Carole N, DO  glipiZIDE (GLUCOTROL) 10 MG tablet Take 1 tablet (10 mg total) by mouth daily before breakfast. 04/12/21  Yes Kayleen Memos, DO  hydrOXYzine (ATARAX/VISTARIL) 25 MG tablet Take 1 tablet (25 mg total) by mouth 3 (three) times daily as needed for itching or anxiety. 04/12/21  Yes Kayleen Memos, DO  isosorbide mononitrate (IMDUR) 30 MG 24 hr tablet Take 90 mg by mouth every morning. 03/05/21  Yes [provider]  isosorbide mononitrate (IMDUR) 60 MG 24 hr tablet Take 60 mg by mouth daily. 04/11/21  Yes [provider]  lactulose (CHRONULAC) 10 GM/15ML solution Take 30 mLs (20 g total) by mouth 3 (three) times daily. 04/12/21  Yes Kayleen Memos, DO  meclizine (ANTIVERT) 25 MG tablet Take 1 tablet (25 mg total) by mouth 3 (three) times daily. 04/12/21  Yes Kayleen Memos, DO  metFORMIN (GLUCOPHAGE-XR) 500 MG 24 hr tablet Take 500 mg by mouth in the morning and at bedtime.   Yes [provider]  pantoprazole (PROTONIX) 40 MG tablet Take 1 tablet (40 mg total) by mouth 2 (two) times daily. 04/10/21 05/10/21 Yes Amin, Jeanella Flattery, MD  rifaximin (XIFAXAN) 550 MG TABS tablet Take 1 tablet (550 mg total) by mouth 2 (two) times daily. 04/12/21 05/12/21 Yes Kayleen Memos, DO  spironolactone  (ALDACTONE) 25 MG tablet Take 25 mg by mouth daily. 04/25/21  Yes [provider]  ciprofloxacin (CIPRO) 500 MG tablet Take 500 mg by mouth 2 (two) times daily.    [provider]  feeding supplement, GLUCERNA SHAKE, (GLUCERNA SHAKE) LIQD Take 237 mLs by mouth 2 (two) times daily between meals.    [provider]  loratadine (CLARITIN) 10 MG tablet Take 10 mg by mouth at bedtime. 03/06/21   [provider]  polyethylene glycol (MIRALAX / GLYCOLAX) 17 g packet Take 8.5 g by mouth daily as needed for mild constipation. Patient not taking: No sig reported    [provider]  pramoxine-hydrocortisone 1-1 % foam Apply 1 g topically 4 (  four) times daily as needed (itching).    [provider]  triamcinolone cream (KENALOG) 0.1 % Apply 1 application topically 2 (two) times daily as needed (itching).    [provider]    Family History  Problem Relation Age of Onset  . Stroke Mother   . Heart attack Mother   . Stroke Father   . Heart attack Father   . Lung cancer Brother      Social History   Tobacco Use  . Smoking status: Never  . Smokeless tobacco: Never  Vaping Use  . Vaping Use: Never used  Substance Use Topics  . Alcohol use: No  . Drug use: No    Allergies as of 04/28/2021 - Review Complete 04/28/2021  Allergen Reaction Noted  . Atorvastatin Other (See Comments) 06/17/2019  . Metoprolol Other (See Comments) 03/19/2017  . Penicillins Swelling 01/21/2016  . Rosuvastatin Other (See Comments) 06/17/2019  . Simvastatin Other (See Comments) 06/17/2019  . Terazosin Other (See Comments) 03/19/2017    Review of Systems:    All systems reviewed and negative except where noted in HPI.   Physical Exam:  Vital signs in last 24 hours: Temp:  [98.5 F (36.9 C)-99.3 F (37.4 C)] 98.5 F (36.9 C) (10/24 1116) Pulse Rate:  [58-71] 70 (10/24 1116) Resp:  [18-20] 20 (10/24 1116) BP: (105-151)/(20-112) 127/69 (10/24  1116) SpO2:  [97 %-100 %] 99 % (10/24 1100) Weight:  [82.1 kg] 82.1 kg (10/24 1116) Last BM Date: 04/28/21 General:   Pleasant, cooperative in NAD Head:  Normocephalic and atraumatic. Eyes:   Positive icterus.   Conjunctiva Yellow. PERRLA. Ears:  Normal auditory acuity. Neck:  Supple; no masses or thyroidomegaly Lungs: Respirations even and unlabored. Lungs clear to auscultation bilaterally.   No wheezes, crackles, or rhonchi.  Heart:  Regular rate and rhythm;  Without murmur, clicks, rubs or gallops Abdomen:  Soft, nondistended, nontender. Normal bowel sounds. No appreciable masses or hepatomegaly.  No rebound or guarding.  Rectal:  Not performed. Msk:  Symmetrical without gross deformities.    Extremities:  Without edema, cyanosis or clubbing. Neurologic:  Alert and oriented x3;  grossly normal neurologically. Skin:  Intact without significant lesions or rashes. Cervical Nodes:  No significant cervical adenopathy. Psych:  Alert and cooperative. Normal affect.  LAB RESULTS: Recent Labs    04/28/21 1156 04/29/21 0613  WBC 8.4 8.6  HGB 10.5* 10.3*  HCT 29.7* 29.6*  PLT 193 203   BMET Recent Labs    04/28/21 1156 04/29/21 0613  NA 138 142  K 4.1 4.1  CL 110 117*  CO2 15* 16*  GLUCOSE 85 74  BUN 92* 87*  CREATININE 2.23* 2.25*  CALCIUM 8.4* 8.7*   LFT Recent Labs    04/28/21 1804 04/29/21 0613  PROT  --  6.0*  ALBUMIN  --  1.9*  AST  --  278*  ALT  --  151*  ALKPHOS  --  205*  BILITOT  --  12.4*  BILIDIR 7.2*  --    PT/INR Recent Labs    04/28/21 1454 04/29/21 0823  LABPROT 67.3* 48.0*  INR 8.1* 5.2*    STUDIES: CT ABDOMEN PELVIS WO CONTRAST  Result Date: 04/28/2021 CLINICAL DATA:  Abdominal abscess/infection suspected. Elevated lipase. EXAM: CT ABDOMEN AND PELVIS WITHOUT CONTRAST TECHNIQUE: Multidetector CT imaging of the abdomen and pelvis was performed following the standard protocol without IV contrast. COMPARISON:  04/02/2021 FINDINGS: Lower  chest: Lung bases are unremarkable. Heart size is normal.  There is dense atherosclerotic calcification the coronary arteries. Hepatobiliary: Moderate, increased ascites. Liver is nodular consistent with cirrhosis. No focal liver lesion. The gallbladder is contracted. Layering calcified gallstones are present. There is new air within the gallbladder lumen and nondilated common bile duct. Gallbladder wall appears thickened but is not fully assessed on CT. Pancreas: Unremarkable. No pancreatic ductal dilatation or surrounding inflammatory changes. Spleen: Enlarged homogeneous spleen. Adrenals/Urinary Tract: RIGHT adrenal gland is normal. LEFT adrenal gland is normal. Coarse calcifications along the UPPER pole of LEFT kidney, stable in appearance. RIGHT kidney is normal. No hydronephrosis. Ureters are unremarkable. Urinary bladder is distended. Stomach/Bowel: Stomach and small bowel loops are normal in appearance. There is mild diffuse thickening of the colonic wall. Average stool burden. No colonic mass. The appendix is not well seen. Vascular/Lymphatic: There is atherosclerotic calcification of the abdominal aorta not associated with aneurysms. No retroperitoneal or mesenteric adenopathy. Numerous venous collaterals identified in the LEFT UPPER QUADRANT and anterior mesentery consistent with portal venous hypertension. Reproductive: Prostatic enlargement. Other: Moderate ascites.  Ascites extends to the RIGHT hemiscrotum. Musculoskeletal: Degenerative changes in the lumbar spine. No acute abnormality. IMPRESSION: 1. Increased ascites. 2. Cirrhotic morphology of the liver.  Portal venous hypertension. 3. Cholelithiasis.  Suspect gallbladder wall thickening. 4. There is interval development of air within the biliary tree and gallbladder lumen, suspicious for emphysematous cholecystitis. Consider further evaluation with MRCP. 5. RIGHT inguinal hernia with ascites extending into the RIGHT hemiscrotum. 6.  Aortic  atherosclerosis.  (ICD10-I70.0) 7. Prostatic enlargement. Electronically Signed   By: Nolon Nations M.D.   On: 04/28/2021 15:26   US Paracentesis  Result Date: 04/29/2021 INDICATION: Patient with a history of cirrhosis and recurrent ascites presents today for a diagnostic and therapeutic paracentesis. EXAM: ULTRASOUND GUIDED PARACENTESIS MEDICATIONS: 1% lidocaine 10 mL COMPLICATIONS: None immediate. PROCEDURE: Informed written consent was obtained from the patient after a discussion of the risks, benefits and alternatives to treatment. A timeout was performed prior to the initiation of the procedure. Initial ultrasound scanning demonstrates a large amount of ascites within the left lower abdominal quadrant. The left lower abdomen was prepped and draped in the usual sterile fashion. 1% lidocaine was used for local anesthesia. Following this, a 19 gauge, 7-cm, Yueh catheter was introduced. An ultrasound image was saved for documentation purposes. The paracentesis was performed. The catheter was removed and a dressing was applied. The patient tolerated the procedure well without immediate post procedural complication. FINDINGS: A total of approximately 3.2 L of yellow fluid was removed. Samples were sent to the laboratory as requested by the clinical team. IMPRESSION: Successful ultrasound-guided paracentesis yielding 3.2 liters of peritoneal fluid. Read by: Soyla Dryer, NP Electronically Signed   By: Albin Felling M.D.   On: 04/29/2021 11:56   MR ABDOMEN MRCP W WO CONTAST  Result Date: 04/29/2021 CLINICAL DATA:  70 year old male with history of cholelithiasis on recent CT examination. Evaluate for potential choledocholithiasis. EXAM: MRI ABDOMEN WITHOUT AND WITH CONTRAST (INCLUDING MRCP) TECHNIQUE: Multiplanar multisequence MR imaging of the abdomen was performed both before and after the administration of intravenous contrast. Heavily T2-weighted images of the biliary and pancreatic ducts were  obtained, and three-dimensional MRCP images were rendered by post processing. CONTRAST:  59m GADAVIST GADOBUTROL 1 MMOL/ML IV SOLN COMPARISON:  Abdominal MRI 04/02/2021. CT the abdomen and pelvis 04/28/2021. FINDINGS: Comment: Today's study is limited by extensive artifact from patient motion, rendering the MRCP images nondiagnostic. Lower chest: Mild elevation of the right hemidiaphragm. Otherwise, unremarkable. Hepatobiliary: Liver  has a shrunken appearance and nodular contour, indicative of advanced cirrhosis. Tiny foci of hyperenhancement are again noted in the right lobe of the liver measuring 7 mm (axial image 14 of series 18) and 5 mm (axial image 18 of series 18), stable compared to the prior examination. No other new suspicious appearing cystic or solid hepatic lesions. No intrahepatic biliary ductal dilatation confidently identified (MRCP images are essentially nondiagnostic). Common bile duct is dilated measuring up to 11 mm in the porta hepatis. There is a suggestion of some debris and/or stones within the common bile duct, however, this is difficult to confirm on today's motion limited examination. Small filling defects are noted within the gallbladder, compatible with cholelithiasis. Gallbladder is decompressed. Small amount of non dependent signal void within the gallbladder is compatible with pneumobilia, better demonstrated on preceding CT examination. Pancreas: No pancreatic mass. No pancreatic ductal dilatation. No peripancreatic fluid collections. Spleen: Spleen is enlarged measuring 16.4 x 6.6 x 14.8 cm (estimated splenic volume of 801 mL) . Adrenals/Urinary Tract: Subcentimeter T1 hypointense, T2 hyperintense, nonenhancing lesions in the right kidney are compatible with tiny simple cysts. Left kidney and bilateral adrenal glands are normal in appearance. No hydroureteronephrosis in the visualized portions of the abdomen Stomach/Bowel: Visualized portions are unremarkable. Vascular/Lymphatic: No  aneurysm identified in the visualized abdominal vasculature. 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 Right portal vein appears chronically occluded. Several prominent borderline enlarged and mildly enlarged lymph nodes are noted adjacent to the liver measuring up to 1.4 cm in short axis in the hepatoduodenal ligament nodal station. Other:  Large volume of ascites. Musculoskeletal: No aggressive appearing osseous lesions are noted in the visualized portions of the skeleton. IMPRESSION: 1. Limited examination secondary to respiratory motion. Common bile duct is dilated up to 11 mm in the porta hepatis. There is a suggestion of debris and/or small gallstones within the common bile duct such that choledocholithiasis is not excluded. However, the lack of intrahepatic biliary ductal dilatation suggests against frank obstruction. 2. Cholelithiasis without definitive evidence to suggest an acute cholecystitis at this time. Small amount of pneumobilia in the gallbladder related to prior sphincterotomy performed on 04/03/2021. 3. Advanced morphologic changes of cirrhosis redemonstrated with small hypervascular lesions in the right lobe of the liver measuring 7 mm or less in size, stable compared to the prior study, once again categorized as LI-RADS 3. Repeat abdominal MRI with and without IV gadolinium is recommended in 6 months to ensure stability. 4. 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 5. Large volume of ascites. Electronically Signed   By: Vinnie Langton M.D.   On: 04/29/2021 06:18      Impression / Plan:   Assessment: Principal Problem:   Acute emphysematous cholecystitis Active Problems:   Type 2  diabetes mellitus without complication, without long-term current use of insulin (HCC)   Coronary artery disease due to lipid rich plaque   Essential hypertension   Liver cirrhosis secondary to NASH [facility])   Coronary artery disease involving native coronary artery of native heart with angina pectoris (HCC)   Portal hypertension (HCC)   GERD (gastroesophageal reflux disease)   Decompensated liver disease (HCC)   AKI (acute kidney injury) ([place])   Malnutrition of moderate  degree   Coagulopathy (HCC)   Jerry [name] is a 70 y.o. y/o male with With advanced cirrhosis with abnormal liver enzymes increased INR and portal vein thrombosis.  The patient had a paracentesis today and feels better.  He is being followed by surgery for possible gallbladder disease.   Plan:  This patient is unlikely to benefit from another ERCP since it is unlikely causing his abnormal liver enzymes nor is it showing signs of obstruction.  The patient will have his lactulose decreased due to the diarrhea and the patient is also on Xifaxan.  At this point supportive care from a GI point of view and a possibly  palliative care consult should be considered.  Thank you for involving me in the care of this patient.      LOS: 1 day   Lucilla Lame, MD, [facility] 04/29/2021, 3:31 PM,  Pager [phone] 7am-5pm  Check AMION for 5pm -7am coverage and on weekends   Note: This dictation was prepared with Dragon dictation along with smaller phrase technology. Any transcriptional errors that result from this process are unintentional.

## 2021-04-29 NOTE — Progress Notes (Addendum)
PROGRESS NOTE    Jerry [name]  OYD:[id number] DOB: Dec 29, 1950 DOA: 04/28/2021 [facility]: [facility]    Brief Narrative:  70 y.o. male with medical history significant of liver cirrhosis, coronary artery disease, GERD, kidney cancer status post left nephrectomy, PTSD, hyperlipidemia, essential hypertension diabetes and GERD who presents to the ER with abdominal pain that was sudden periumbilical moving to the right upper quadrant.  Associated with some nausea but no vomiting.  Denies any fever or chills.  Patient also has significant ascites with involvement of his respiratory system.  Patient was seen and evaluated in the ER.  Pain was 7 out of 10.  No melena.  No radiation.  No bright red blood per rectum.  No hemoptysis or hematemesis.  Was found to have possible cholecystitis.  Associated with also significant ascites and coagulopathy with INR of more than 8.  Patient has decompensated cirrhosis.  General surgery consulted on admission.  Recommended MRCP for further evaluation.  At this point patient not a surgical candidate.  MRCP revealing pneumobilia likely secondary to fairly recent ERCP on 9/28.  Patient has elevated total and direct bilirubin, transaminitis, coagulopathy.  Also has ascites.  Recent paracentesis done on 10/21.  Recurrent ascites noted on evaluation today.  MRCP with dilated common bile duct, 11 mm within porta hepatis.  No clear evidence of obstruction however choledocholithiasis is not excluded.  Gastroenterology consult requested.    Assessment & Plan:   Principal Problem:   Acute emphysematous cholecystitis Active Problems:   Type 2 diabetes mellitus without complication, without long-term current use of insulin (HCC)   Coronary artery disease due to lipid rich plaque   Essential hypertension   Liver cirrhosis secondary to NASH [facility])   Coronary artery disease involving native coronary artery of native heart with angina pectoris (HCC)   Portal hypertension  (HCC)   GERD (gastroesophageal reflux disease)   Decompensated liver disease (HCC)   AKI (acute kidney injury) ([place])   Malnutrition of moderate degree   Coagulopathy (HCC)  Decompensated hepatic cirrhosis with ascites Coagulopathy Transaminitis Patient with recent paracentesis, 10/21 Presents with abdominal pain and a recurrent ascites Plan: Rocephin for SBP prophylaxis Ultrasound-guided paracentesis PTA Lasix PTA Aldactone PTA lactulose PTA Xifaxan  Hyperbilirubinemia CBD dilatation Patient with elevated total and direct bilirubin Could be result of decompensated cirrhosis CBD stone not excluded, CBD dilated to 11 mm within porta hepatis MRCP with no evidence of obstruction Patient stable, afebrile, not acutely cholangitic on presentation Plan: GI consult Consider repeat ERCP Empiric antibiotics Daily CMP  Pneumobilia Initially concern for emphysematous cholecystitis No clear evidence of this on MRCP Patient not a surgical candidate Plan: Monitor  Severe coagulopathy Secondary to advanced liver disease in the setting of chronic anticoagulation on Eliquis Plan: Eliquis on hold Status post vitamin K Daily INR  Acute kidney injury Patient with normal baseline creatinine Creatinine on presentation 2.23 Prerenal azotemia versus ATN Hepatorenal syndrome remains on differential Plan: Check urinary sodium Daily CMP Consider nephrology evaluation  Chronic anticoagulation Chronic portal vein thrombosis Patient is on Eliquis This has been held Plan: Continue to hold Eliquis  Coronary artery disease Stable at this point No evidence of decompensation Plan: Continue home Coreg Continue home Imdur Cautious while on diuretic  GERD PPI  Severe protein calorie malnutrition Nutrition consult  Central hypertension PTA Coreg and Imdur  Anemia of chronic liver disease Appears at or near baseline No negation for transfusion Monitor hemoglobin, transfuse  as needed Hb less than 8  DVT prophylaxis: SCD's Code Status: DNR Family Communication: Son Shanon Payor [phone] on 10/24 Disposition Plan: Status is: Inpatient  Remains inpatient appropriate because: Decompensated cirrhosis, hyperbilirubinemia.  Surgery and GI consults.  Disposition plan pending.       Level of care: Med-Surg  Consultants:  General surgery GI  Procedures:  None  Antimicrobials: Ceftriaxone Metronidazole   Subjective: Patient seen and examined.  Continues to endorse abdominal pain and distention.  Objective: Vitals:   04/28/21 2155 04/29/21 0443 04/29/21 0700 04/29/21 0724  BP: 134/66 (!) 128/56 (!) 110/49 (!) 110/49  Pulse: 67 70 71 71  Resp: 20 18 18 18   Temp: 98.7 F (37.1 C) 99.3 F (37.4 C) 98.6 F (37 C) 98.5 F (36.9 C)  TempSrc: Oral Oral Oral Oral  SpO2: 100% 100% 100% 100%  Height:    5' 11"  (1.803 m)    Intake/Output Summary (Last 24 hours) at 04/29/2021 1036 Last data filed at 04/29/2021 0939 Gross per 24 hour  Intake 500 ml  Output 500 ml  Net 0 ml   There were no vitals filed for this visit.  Examination:  General exam: Mild distress due to pain.  Appears chronically ill Respiratory system: Poor respiratory effort.  Bibasilar crackles.  Normal work of breathing.  Room air Cardiovascular system: S1-S2, RRR, no murmurs, no pedal edema Gastrointestinal system: Distended, tender to palpation RUQ, positive bowel sounds Central nervous system: Alert and oriented. No focal neurological deficits. Extremities: Symmetric 5 x 5 power. Skin: Icteric Psychiatry: Judgement and insight appear normal. Mood & affect appropriate.     Data Reviewed: I have personally reviewed following labs and imaging studies  CBC: Recent Labs  Lab 04/26/21 1124 04/28/21 1156 04/29/21 0613  WBC 9.1 8.4 8.6  HGB 10.0* 10.5* 10.3*  HCT 29.1* 29.7* 29.6*  MCV 100.7* 99.7 98.7  PLT 187 193 947   Basic Metabolic Panel: Recent Labs   Lab 04/26/21 1124 04/28/21 1156 04/29/21 0613  NA 136 138 142  K 4.4 4.1 4.1  CL 112* 110 117*  CO2 16* 15* 16*  GLUCOSE 64* 85 74  BUN 87* 92* 87*  CREATININE 2.19* 2.23* 2.25*  CALCIUM 8.7* 8.4* 8.7*   GFR: CrCl cannot be calculated (Unknown ideal weight.). Liver Function Tests: Recent Labs  Lab 04/26/21 1124 04/28/21 1156 04/29/21 0613  AST 239* 288* 278*  ALT 134* 153* 151*  ALKPHOS 227* 238* 205*  BILITOT 12.7* 13.3* 12.4*  PROT 6.1* 6.3* 6.0*  ALBUMIN 1.9* 2.0* 1.9*   Recent Labs  Lab 04/26/21 1124 04/28/21 1156  LIPASE 98* 130*   Recent Labs  Lab 04/26/21 1124 04/28/21 2205  AMMONIA 87* 100*   Coagulation Profile: Recent Labs  Lab 04/28/21 1454 04/29/21 0823  INR 8.1* 5.2*   Cardiac Enzymes: No results for input(s): CKTOTAL, CKMB, CKMBINDEX, TROPONINI in the last 168 hours. BNP (last 3 results) No results for input(s): PROBNP in the last 8760 hours. HbA1C: No results for input(s): HGBA1C in the last 72 hours. CBG: Recent Labs  Lab 04/28/21 2214 04/29/21 0822 04/29/21 0858  GLUCAP 123* 69* 108*   Lipid Profile: No results for input(s): CHOL, HDL, LDLCALC, TRIG, CHOLHDL, LDLDIRECT in the last 72 hours. Thyroid Function Tests: No results for input(s): TSH, T4TOTAL, FREET4, T3FREE, THYROIDAB in the last 72 hours. Anemia Panel: No results for input(s): VITAMINB12, FOLATE, FERRITIN, TIBC, IRON, RETICCTPCT in the last 72 hours. Sepsis Labs: No results for input(s): PROCALCITON, LATICACIDVEN in the last 168 hours.  Recent Results (  from the past 240 hour(s))  Resp Panel by RT-PCR (Flu A&B, Covid) Nasopharyngeal Swab     Status: None   Collection Time: 04/28/21  7:26 PM   Specimen: Nasopharyngeal Swab; Nasopharyngeal(NP) swabs in vial transport medium  Result Value Ref Range Status   SARS Coronavirus 2 by RT PCR NEGATIVE NEGATIVE Final    Comment: (NOTE) SARS-CoV-2 target nucleic acids are NOT DETECTED.  The SARS-CoV-2 RNA is generally  detectable in upper respiratory specimens during the acute phase of infection. The lowest concentration of SARS-CoV-2 viral copies this assay can detect is 138 copies/mL. A negative result does not preclude SARS-Cov-2 infection and should not be used as the sole basis for treatment or other patient management decisions. A negative result may occur with  improper specimen collection/handling, submission of specimen other than nasopharyngeal swab, presence of viral mutation(s) within the areas targeted by this assay, and inadequate number of viral copies(<138 copies/mL). A negative result must be combined with clinical observations, patient history, and epidemiological information. The expected result is Negative.  Fact Sheet for Patients:  EntrepreneurPulse.com.au  Fact Sheet for Healthcare Providers:  IncredibleEmployment.be  This test is no t yet approved or cleared by the [place] FDA and  has been authorized for detection and/or diagnosis of SARS-CoV-2 by FDA under an Emergency Use Authorization (EUA). This EUA will remain  in effect (meaning this test can be used) for the duration of the COVID-19 declaration under Section 564(b)(1) of the Act, 21 U.S.C.section 360bbb-3(b)(1), unless the authorization is terminated  or revoked sooner.       Influenza A by PCR NEGATIVE NEGATIVE Final   Influenza B by PCR NEGATIVE NEGATIVE Final    Comment: (NOTE) The Xpert Xpress SARS-CoV-2/FLU/RSV plus assay is intended as an aid in the diagnosis of influenza from Nasopharyngeal swab specimens and should not be used as a sole basis for treatment. Nasal washings and aspirates are unacceptable for Xpert Xpress SARS-CoV-2/FLU/RSV testing.  Fact Sheet for Patients: EntrepreneurPulse.com.au  Fact Sheet for Healthcare Providers: IncredibleEmployment.be  This test is not yet approved or cleared by the [place] FDA  and has been authorized for detection and/or diagnosis of SARS-CoV-2 by FDA under an Emergency Use Authorization (EUA). This EUA will remain in effect (meaning this test can be used) for the duration of the COVID-19 declaration under Section 564(b)(1) of the Act, 21 U.S.C. section 360bbb-3(b)(1), unless the authorization is terminated or revoked.  Performed at [facility], [address] [place], [place] [address]   MRSA Next Gen by PCR, Nasal     Status: None   Collection Time: 04/28/21 11:05 PM   Specimen: Nasal Mucosa; Nasal Swab  Result Value Ref Range Status   MRSA by PCR Next Gen NOT DETECTED NOT DETECTED Final    Comment: (NOTE) The GeneXpert MRSA Assay (FDA approved for NASAL specimens only), is one component of a comprehensive MRSA colonization surveillance program. It is not intended to diagnose MRSA infection nor to guide or monitor treatment for MRSA infections. Test performance is not FDA approved in patients less than 51 years old. Performed at [facility], [address] [place], [place] [address]          Radiology Studies: CT ABDOMEN PELVIS WO CONTRAST  Result Date: 04/28/2021 CLINICAL DATA:  Abdominal abscess/infection suspected. Elevated lipase. EXAM: CT ABDOMEN AND PELVIS WITHOUT CONTRAST TECHNIQUE: Multidetector CT imaging of the abdomen and pelvis was performed following the standard protocol without IV contrast. COMPARISON:  04/02/2021 FINDINGS:  Lower chest: Lung bases are unremarkable. Heart size is normal. There is dense atherosclerotic calcification the coronary arteries. Hepatobiliary: Moderate, increased ascites. Liver is nodular consistent with cirrhosis. No focal liver lesion. The gallbladder is contracted. Layering calcified gallstones are present. There is new air within the gallbladder lumen and nondilated common bile duct. Gallbladder wall appears thickened but is not fully assessed on CT. Pancreas: Unremarkable. No  pancreatic ductal dilatation or surrounding inflammatory changes. Spleen: Enlarged homogeneous spleen. Adrenals/Urinary Tract: RIGHT adrenal gland is normal. LEFT adrenal gland is normal. Coarse calcifications along the UPPER pole of LEFT kidney, stable in appearance. RIGHT kidney is normal. No hydronephrosis. Ureters are unremarkable. Urinary bladder is distended. Stomach/Bowel: Stomach and small bowel loops are normal in appearance. There is mild diffuse thickening of the colonic wall. Average stool burden. No colonic mass. The appendix is not well seen. Vascular/Lymphatic: There is atherosclerotic calcification of the abdominal aorta not associated with aneurysms. No retroperitoneal or mesenteric adenopathy. Numerous venous collaterals identified in the LEFT UPPER QUADRANT and anterior mesentery consistent with portal venous hypertension. Reproductive: Prostatic enlargement. Other: Moderate ascites.  Ascites extends to the RIGHT hemiscrotum. Musculoskeletal: Degenerative changes in the lumbar spine. No acute abnormality. IMPRESSION: 1. Increased ascites. 2. Cirrhotic morphology of the liver.  Portal venous hypertension. 3. Cholelithiasis.  Suspect gallbladder wall thickening. 4. There is interval development of air within the biliary tree and gallbladder lumen, suspicious for emphysematous cholecystitis. Consider further evaluation with MRCP. 5. RIGHT inguinal hernia with ascites extending into the RIGHT hemiscrotum. 6.  Aortic atherosclerosis.  (ICD10-I70.0) 7. Prostatic enlargement. Electronically Signed   By: Nolon Nations M.D.   On: 04/28/2021 15:26   MR ABDOMEN MRCP W WO CONTAST  Result Date: 04/29/2021 CLINICAL DATA:  70 year old male with history of cholelithiasis on recent CT examination. Evaluate for potential choledocholithiasis. EXAM: MRI ABDOMEN WITHOUT AND WITH CONTRAST (INCLUDING MRCP) TECHNIQUE: Multiplanar multisequence MR imaging of the abdomen was performed both before and after the  administration of intravenous contrast. Heavily T2-weighted images of the biliary and pancreatic ducts were obtained, and three-dimensional MRCP images were rendered by post processing. CONTRAST:  66m GADAVIST GADOBUTROL 1 MMOL/ML IV SOLN COMPARISON:  Abdominal MRI 04/02/2021. CT the abdomen and pelvis 04/28/2021. FINDINGS: Comment: Today's study is limited by extensive artifact from patient motion, rendering the MRCP images nondiagnostic. Lower chest: Mild elevation of the right hemidiaphragm. Otherwise, unremarkable. Hepatobiliary: Liver has a shrunken appearance and nodular contour, indicative of advanced cirrhosis. Tiny foci of hyperenhancement are again noted in the right lobe of the liver measuring 7 mm (axial image 14 of series 18) and 5 mm (axial image 18 of series 18), stable compared to the prior examination. No other new suspicious appearing cystic or solid hepatic lesions. No intrahepatic biliary ductal dilatation confidently identified (MRCP images are essentially nondiagnostic). Common bile duct is dilated measuring up to 11 mm in the porta hepatis. There is a suggestion of some debris and/or stones within the common bile duct, however, this is difficult to confirm on today's motion limited examination. Small filling defects are noted within the gallbladder, compatible with cholelithiasis. Gallbladder is decompressed. Small amount of non dependent signal void within the gallbladder is compatible with pneumobilia, better demonstrated on preceding CT examination. Pancreas: No pancreatic mass. No pancreatic ductal dilatation. No peripancreatic fluid collections. Spleen: Spleen is enlarged measuring 16.4 x 6.6 x 14.8 cm (estimated splenic volume of 801 mL) . Adrenals/Urinary Tract: Subcentimeter T1 hypointense, T2 hyperintense, nonenhancing lesions in the right kidney are compatible  with tiny simple cysts. Left kidney and bilateral adrenal glands are normal in appearance. No hydroureteronephrosis in the  visualized portions of the abdomen Stomach/Bowel: Visualized portions are unremarkable. Vascular/Lymphatic: No aneurysm identified in the visualized abdominal vasculature. 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 Right portal vein appears chronically occluded. Several prominent borderline enlarged and mildly enlarged lymph nodes are noted adjacent to the liver measuring up to 1.4 cm in short axis in the hepatoduodenal ligament nodal station. Other:  Large volume of ascites. Musculoskeletal: No aggressive appearing osseous lesions are noted in the visualized portions of the skeleton. IMPRESSION: 1. Limited examination secondary to respiratory motion. Common bile duct is dilated up to 11 mm in the porta hepatis. There is a suggestion of debris and/or small gallstones within the common bile duct such that choledocholithiasis is not excluded. However, the lack of intrahepatic biliary ductal dilatation suggests against frank obstruction. 2. Cholelithiasis without definitive evidence to suggest an acute cholecystitis at this time. Small amount of pneumobilia in the gallbladder related to prior sphincterotomy performed on 04/03/2021. 3. Advanced morphologic changes of cirrhosis redemonstrated with small hypervascular lesions in the right lobe of the liver measuring 7 mm or less in size, stable compared to the prior study, once again categorized as LI-RADS 3. Repeat abdominal MRI with and without IV gadolinium is recommended in 6 months to ensure stability. 4. 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 5. Large volume of ascites. Electronically Signed   By: Vinnie Langton M.D.   On: 04/29/2021 06:18        Scheduled  Meds:  camphor-menthol   Topical BID   carvedilol  3.125 mg Oral BID   cholestyramine light  4 g Oral q12n4p   feeding supplement (GLUCERNA SHAKE)  237 mL Oral BID BM   furosemide  20 mg Oral Daily   insulin aspart  0-15 Units Subcutaneous TID WC   insulin aspart  0-5 Units Subcutaneous QHS   isosorbide mononitrate  60 mg Oral Daily   isosorbide mononitrate  90 mg Oral BH-q7a   lactulose  20 g Oral TID   loratadine  10 mg Oral QHS   loratadine  5 mg Oral Daily   meclizine  25 mg Oral TID   metroNIDAZOLE  500 mg Oral Q8H   pantoprazole  40 mg Oral BID AC & HS   rifaximin  550 mg Oral BID   spironolactone  25 mg Oral Daily   Continuous Infusions:  sodium chloride     cefTRIAXone (ROCEPHIN)  IV       LOS: 1 day    Time spent: 35 minutes    Sidney Ace, MD [facility]   If 7PM-7AM, please contact night-coverage  04/29/2021, 10:36 AM

## 2021-04-29 NOTE — TOC Initial Note (Signed)
Transition of Care [facility]) - Initial/Assessment Note    Patient Details  Name: Jerry [name] MRN: [id number] Date of Birth: 1950-08-14  Transition of Care [facility]) CM/SW Contact:    Beverly Sessions, RN Phone Number: 04/29/2021, 1:33 PM  Clinical Narrative:                 Patient admitted for Acute emphysematous cholecystitis Patient off the floor for [place] Patient admitted from [facility]  Confirmed wit Hilda Blades at [facility] that patient is there for STR Per MD no clear disposition at this point.  Signifiant other was in room and stated she wasn't sure if patient would want to return at [facility] at discharge   Expected Discharge Plan: [place] Barriers to Discharge: Continued Medical Work up   Patient Goals and CMS Choice        Expected Discharge Plan and Services Expected Discharge Plan: [place]       Living arrangements for the past 2 months: [place]                                      Prior Living Arrangements/Services Living arrangements for the past 2 months: [place] Lives with:: Facility Resident                   Activities of Daily Living Home Assistive Devices/Equipment: Environmental consultant (specify type) (rolling) ADL Screening (condition at time of admission) Patient's cognitive ability adequate to safely complete daily activities?: Yes Is the patient deaf or have difficulty hearing?: Yes Does the patient have difficulty seeing, even when wearing glasses/contacts?: Yes Does the patient have difficulty concentrating, remembering, or making decisions?: Yes Patient able to express need for assistance with ADLs?: Yes Does the patient have difficulty dressing or bathing?: Yes Independently performs ADLs?: No Communication: Independent Dressing (OT): Needs assistance Is this a change from baseline?: Pre-admission baseline Grooming: Needs assistance Is this a change from baseline?: Pre-admission  baseline Feeding: Independent Bathing: Needs assistance Is this a change from baseline?: Pre-admission baseline Toileting: Needs assistance Is this a change from baseline?: Pre-admission baseline In/Out Bed: Needs assistance Is this a change from baseline?: Pre-admission baseline Walks in Home: Needs assistance Is this a change from baseline?: Pre-admission baseline Does the patient have difficulty walking or climbing stairs?: Yes Weakness of Legs: Both Weakness of Arms/Hands: Both  Permission Sought/Granted                  Emotional Assessment           Psych Involvement: No (comment)  Admission diagnosis:  Acute emphysematous cholecystitis [K81.0] Ascites of liver [R18.8] Patient Active Problem List   Diagnosis Date Noted   Acute emphysematous cholecystitis 04/28/2021   Coagulopathy ([place]) 04/28/2021   Malnutrition of moderate degree 04/05/2021   Decompensated liver disease ([place])    Hyponatremia    AKI (acute kidney injury) ([place])    Transaminitis 04/02/2021   Jaundice 04/02/2021   Choledocholithiasis with obstruction 04/02/2021   Leukocytosis 04/02/2021   GERD (gastroesophageal reflux disease) 04/02/2021   Portal hypertension ([place])    Choledocholithiasis    Elevated liver enzymes    Status post reverse arthroplasty of shoulder, 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   Chest pain due to CAD ([place]) 03/27/2017   Upper GI bleed    S/P coronary artery stent placement    Coronary artery disease involving native coronary artery  of native heart with angina pectoris ([place])    Chest pain 03/19/2017   Liver cirrhosis secondary to NASH ([place]) 03/19/2017   Portal vein thrombosis 01/21/2016   Type 2 diabetes mellitus without complication, without long-term current use of insulin ([place]) 01/21/2016   Coronary artery disease due to lipid rich plaque 01/21/2016   Essential hypertension    PCP:  [place]:   [facility] [place], [place] [address] Phone: [phone] Fax: [place], [place] - [place] [place] [address] Phone: [phone] Fax: [phone]  [id number] [place], [place] - [address] [place] [address] Phone: [phone] Fax: [phone]     Social Determinants of Health ([place]) Interventions    Readmission Risk Interventions Readmission Risk Prevention Plan 04/29/2021  Transportation Screening Complete  Palliative Care Screening Complete  Medication Review (RN Care Manager) Complete  Some recent data might be hidden

## 2021-04-29 NOTE — Procedures (Signed)
PROCEDURE SUMMARY:  Successful US guided paracentesis from left abdomen.  Yielded 3.2 L of yellow fluid.  No immediate complications.  Pt tolerated well.   Specimen sent for labs.  EBL < 2 mL  Theresa Duty, NP 04/29/2021 11:19 AM

## 2021-04-30 DIAGNOSIS — K810 Acute cholecystitis: Secondary | ICD-10-CM | POA: Diagnosis not present

## 2021-04-30 LAB — GLUCOSE, CAPILLARY
Glucose-Capillary: 181 mg/dL — ABNORMAL HIGH (ref 70–99)
Glucose-Capillary: 221 mg/dL — ABNORMAL HIGH (ref 70–99)
Glucose-Capillary: 230 mg/dL — ABNORMAL HIGH (ref 70–99)
Glucose-Capillary: 145 mg/dL — ABNORMAL HIGH (ref 70–99)

## 2021-04-30 MED ORDER — CIPROFLOXACIN HCL 500 MG PO TABS
500.0000 mg | ORAL_TABLET | Freq: Two times a day (BID) | ORAL | Status: DC
  Administered 2021-04-30 – 2021-05-03 (×6): 500 mg via ORAL
  Filled 2021-04-30 (×6): qty 1

## 2021-04-30 NOTE — TOC Progression Note (Signed)
Transition of Care [facility]) - Progression Note    Patient Details  Name: Jerry [name] MRN: [id number] Date of Birth: 12-04-1950  [facility]) CM/SW Contact  Beverly Sessions, RN Phone Number: 04/30/2021, 1:18 PM  Clinical Narrative:     Per MD patient appropriate for hospice home.Confirmed with patient and he would like to pursue [place]  Referral made to Lorenza [place] with.hospice  Per patients requested update son Jerry [name] by phone  Expected Discharge Plan: [place] Barriers to Discharge: Continued Medical Work up  Expected Discharge Plan and Services Expected Discharge Plan: [place] arrangements for the past 2 months: [place]                                       Social Determinants of Health (SDOH) Interventions    Readmission Risk Interventions Readmission Risk Prevention Plan 04/29/2021  Transportation Screening Complete  Palliative Care Screening Complete  Medication Review Press photographer) Complete  Some recent data might be hidden

## 2021-04-30 NOTE — Progress Notes (Signed)
Subjective:  CC: Jerry [name] is a 70 y.o. male  Hospital stay day 1,   cirrhois of liver, pneumobilia  HPI: No acute issues overnight.  Pain better. Asking for hospice care  ROS:  General: Denies weight loss, weight gain, fatigue, fevers, chills, and night sweats. Heart: Denies chest pain, palpitations, racing heart, irregular heartbeat, leg pain or swelling, and decreased activity tolerance. Respiratory: Denies breathing difficulty, shortness of breath, wheezing, cough, and sputum. GI: Denies change in appetite, heartburn, nausea, vomiting, constipation, diarrhea, and blood in stool. GU: Denies difficulty urinating, pain with urinating, urgency, frequency, blood in urine.   Objective:   Temp:  [97.7 F (36.5 C)-98.6 F (37 C)] 97.7 F (36.5 C) (10/25 0937) Pulse Rate:  [62-72] 64 (10/25 0937) Resp:  [16-20] 18 (10/25 0937) BP: (106-127)/(48-69) 106/64 (10/25 0937) SpO2:  [95 %-100 %] 98 % (10/25 0937) Weight:  [82.1 kg] 82.1 kg (10/24 1116)     Height: 5' 11"  (180.3 cm) Weight: 82.1 kg BMI (Calculated): 25.26   Intake/Output this shift:   Intake/Output Summary (Last 24 hours) at 04/30/2021 1047 Last data filed at 04/30/2021 1000 Gross per 24 hour  Intake 512.56 ml  Output 800 ml  Net -287.44 ml    Constitutional :  alert, cooperative, appears stated age, and no distress  Respiratory:  clear to auscultation bilaterally  Cardiovascular:  regular rate and rhythm  Gastrointestinal: soft, non-tender; bowel sounds normal; no masses,  no organomegaly. Distended abdomen with ascites  Skin: Cool and moist. jaundiced  Psychiatric: Normal affect, non-agitated, not confused       LABS:  CMP Latest Ref Rng & Units 04/29/2021 04/28/2021 04/26/2021  Glucose 70 - 99 mg/dL 74 85 64(L)  BUN 8 - 23 mg/dL 87(H) 92(H) 87(H)  Creatinine 0.61 - 1.24 mg/dL 2.25(H) 2.23(H) 2.19(H)  Sodium 135 - 145 mmol/L 142 138 136  Potassium 3.5 - 5.1 mmol/L 4.1 4.1 4.4  Chloride 98 - 111 mmol/L  117(H) 110 112(H)  CO2 22 - 32 mmol/L 16(L) 15(L) 16(L)  Calcium 8.9 - 10.3 mg/dL 8.7(L) 8.4(L) 8.7(L)  Total Protein 6.5 - 8.1 g/dL 6.0(L) 6.3(L) 6.1(L)  Total Bilirubin 0.3 - 1.2 mg/dL 12.4(H) 13.3(H) 12.7(H)  Alkaline Phos 38 - 126 U/L 205(H) 238(H) 227(H)  AST 15 - 41 U/L 278(H) 288(H) 239(H)  ALT 0 - 44 U/L 151(H) 153(H) 134(H)   CBC Latest Ref Rng & Units 04/29/2021 04/28/2021 04/26/2021  WBC 4.0 - 10.5 K/uL 8.6 8.4 9.1  Hemoglobin 13.0 - 17.0 g/dL 10.3(L) 10.5(L) 10.0(L)  Hematocrit 39.0 - 52.0 % 29.6(L) 29.7(L) 29.1(L)  Platelets 150 - 400 K/uL 203 193 187    RADS: CLINICAL DATA:  70 year old male with history of cholelithiasis on recent CT examination. Evaluate for potential choledocholithiasis.   EXAM: MRI ABDOMEN WITHOUT AND WITH CONTRAST (INCLUDING MRCP)   TECHNIQUE: Multiplanar multisequence MR imaging of the abdomen was performed both before and after the administration of intravenous contrast. Heavily T2-weighted images of the biliary and pancreatic ducts were obtained, and three-dimensional MRCP images were rendered by post processing.   CONTRAST:  42m GADAVIST GADOBUTROL 1 MMOL/ML IV SOLN   COMPARISON:  Abdominal MRI 04/02/2021. CT the abdomen and pelvis 04/28/2021.   FINDINGS: Comment: Today's study is limited by extensive artifact from patient motion, rendering the MRCP images nondiagnostic.   Lower chest: Mild elevation of the right hemidiaphragm. Otherwise, unremarkable.   Hepatobiliary: Liver has a shrunken appearance and nodular contour, indicative of advanced cirrhosis. Tiny foci of hyperenhancement  are again noted in the right lobe of the liver measuring 7 mm (axial image 14 of series 18) and 5 mm (axial image 18 of series 18), stable compared to the prior examination. No other new suspicious appearing cystic or solid hepatic lesions. No intrahepatic biliary ductal dilatation confidently identified (MRCP images are essentially nondiagnostic).  Common bile duct is dilated measuring up to 11 mm in the porta hepatis. There is a suggestion of some debris and/or stones within the common bile duct, however, this is difficult to confirm on today's motion limited examination. Small filling defects are noted within the gallbladder, compatible with cholelithiasis. Gallbladder is decompressed. Small amount of non dependent signal void within the gallbladder is compatible with pneumobilia, better demonstrated on preceding CT examination.   Pancreas: No pancreatic mass. No pancreatic ductal dilatation. No peripancreatic fluid collections.   Spleen: Spleen is enlarged measuring 16.4 x 6.6 x 14.8 cm (estimated splenic volume of 801 mL) .   Adrenals/Urinary Tract: Subcentimeter T1 hypointense, T2 hyperintense, nonenhancing lesions in the right kidney are compatible with tiny simple cysts. Left kidney and bilateral adrenal glands are normal in appearance. No hydroureteronephrosis in the visualized portions of the abdomen   Stomach/Bowel: Visualized portions are unremarkable.   Vascular/Lymphatic: No aneurysm identified in the visualized abdominal vasculature. 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 Right portal vein appears chronically occluded. Several prominent borderline enlarged and mildly enlarged lymph nodes are noted adjacent to the liver measuring up to 1.4 cm in short axis in the hepatoduodenal ligament nodal station.   Other:  Large volume of ascites.   Musculoskeletal: No aggressive appearing osseous lesions are noted in the visualized portions of the skeleton.   IMPRESSION: 1. Limited examination secondary to respiratory motion. Common bile duct is dilated up to 11 mm in the porta hepatis. There is a suggestion of debris and/or small gallstones within the  common bile duct such that choledocholithiasis is not excluded. However, the lack of intrahepatic biliary ductal dilatation suggests against frank obstruction. 2. Cholelithiasis without definitive evidence to suggest an acute cholecystitis at this time. Small amount of pneumobilia in the gallbladder related to prior sphincterotomy performed on 04/03/2021. 3. Advanced morphologic changes of cirrhosis redemonstrated with small hypervascular lesions in the right lobe of the liver measuring 7 mm or less in size, stable compared to the prior study, once again categorized as LI-RADS 3. Repeat abdominal MRI with and without IV gadolinium is recommended in 6 months to ensure stability. 4. 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 5. Large volume of ascites.     Electronically Signed   By: Vinnie Langton M.D.   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  Further chart review noted he actually had ERCP recently.  Pneumobilia likely from procedure, since patient remains stable.  Regardless, pt is requesting hospice at this point.  Plan relayed to primary.  Surgery will sign off.

## 2021-04-30 NOTE — Progress Notes (Addendum)
[facility]) Hospital Liaison Note   Received request from Transitions of Care Manager Isaias Cowman, RN for family interest in [place]. Visited patient at bedside and spoke with significant other, [name] and son, Billyjoe to confirm interest and explain services. Patient chart and information under review by [facility] physician. [facility]ospice Home eligibility pending at this time.    Unfortunately, Hospice Home is not able to offer a room today. Family and [facility] Manager aware hospital liaison will follow up tomorrow or sooner if patient is eligible and if a room becomes available.    Please do not hesitate to call with any hospice related questions.    Thank you for the opportunity to participate in this patient's care.   Bobbie "Loren Racer, RN, BSN [facility] Liaison [phone]

## 2021-04-30 NOTE — Progress Notes (Signed)
PROGRESS NOTE    Jerry [name]  OJJ:[id number] DOB: 07-Sep-1950 DOA: 04/28/2021 PCP: [facility]    Brief Narrative:  70 y.o. male with medical history significant of liver cirrhosis, coronary artery disease, GERD, kidney cancer status post left nephrectomy, PTSD, hyperlipidemia, essential hypertension diabetes and GERD who presents to the ER with abdominal pain that was sudden periumbilical moving to the right upper quadrant.  Associated with some nausea but no vomiting.  Denies any fever or chills.  Patient also has significant ascites with involvement of his respiratory system.  Patient was seen and evaluated in the ER.  Pain was 7 out of 10.  No melena.  No radiation.  No bright red blood per rectum.  No hemoptysis or hematemesis.  Was found to have possible cholecystitis.  Associated with also significant ascites and coagulopathy with INR of more than 8.  Patient has decompensated cirrhosis.  General surgery consulted on admission.  Recommended MRCP for further evaluation.  At this point patient not a surgical candidate.  MRCP revealing pneumobilia likely secondary to fairly recent ERCP on 9/28.  Patient has elevated total and direct bilirubin, transaminitis, coagulopathy.  Also has ascites.  Recent paracentesis done on 10/21.  Recurrent ascites noted on evaluation today.  MRCP with dilated common bile duct, 11 mm within porta hepatis.  No clear evidence of obstruction however choledocholithiasis is not excluded.  Gastroenterology consult requested.  GI consulted.  Recommendations appreciated.  Per GI likely no benefit to repeat ERCP.  Bilirubin and LFTs have not decreased since prior ERCP.  No clear evidence of obstruction on MRCP.  After conversations with the patient he desires to "be made comfortable".  I requested consultation from palliative care and will discuss with [name] regarding engagement of hospice liaison.    Assessment & Plan:   Principal Problem:   Acute emphysematous  cholecystitis Active Problems:   Type 2 diabetes mellitus without complication, without long-term current use of insulin (HCC)   Coronary artery disease due to lipid rich plaque   Essential hypertension   Liver cirrhosis secondary to NASH [facility])   Coronary artery disease involving native coronary artery of native heart with angina pectoris (HCC)   Portal hypertension (HCC)   GERD (gastroesophageal reflux disease)   Decompensated liver disease (HCC)   AKI (acute kidney injury) ([place])   Malnutrition of moderate degree   Coagulopathy (HCC)   Ascites of liver   Abnormal liver enzymes  Decompensated hepatic cirrhosis with ascites Coagulopathy Transaminitis Patient with recent paracentesis, 10/21 Presents with abdominal pain and a recurrent ascites Ultrasound-guided paracentesis completed, 3.2 L fluid Plan: Continue empiric Rocephin for now PTA Lasix PTA Aldactone PTA lactulose PTA Xifaxan -Patient has advanced cirrhosis.  Poor prognosis.  Patient has expressed desire to "be made comfortable".  Does not wish to continue fighting.  Does not wish for any aggressive medical intervention.  As such he is likely appropriate for discharge with hospice services.  Will need to engage palliative care and hospice liaison to determine appropriate plan of care.  Patient lives alone and home may not be a safe disposition.  Will consider disposition to inpatient hospice.  Hyperbilirubinemia CBD dilatation Patient with elevated total and direct bilirubin Could be result of decompensated cirrhosis CBD stone not excluded, CBD dilated to 11 mm within porta hepatis MRCP with no evidence of obstruction Patient stable, afebrile, not acutely cholangitic on presentation GI consulted, no indication for repeat ERCP Plan: Empiric antibiotics Daily CMP  Pneumobilia Initially concern for emphysematous  cholecystitis No clear evidence of this on MRCP Patient not a surgical candidate Pneumobilia likely  secondary to recent instrumentation Plan: Monitor  Severe coagulopathy Chronic portal vein thrombosis Secondary to advanced liver disease in the setting of chronic anticoagulation on Eliquis Plan: Eliquis on hold Status post vitamin K Daily INR  Acute kidney injury Patient with normal baseline creatinine Creatinine on presentation 2.23 Prerenal azotemia versus ATN Hepatorenal syndrome remains on differential, however urine sodium is normal Plan: Daily CMP Consider nephrology evaluation  Chronic anticoagulation Chronic portal vein thrombosis Patient is on Eliquis This has been held Plan: Continue to hold Eliquis  Coronary artery disease Stable at this point No evidence of decompensation Plan: Continue home Coreg Continue home Imdur Cautious while on diuretic  GERD PPI  Severe protein calorie malnutrition Nutrition consult  Central hypertension PTA Coreg and Imdur  Anemia of chronic liver disease Appears at or near baseline No negation for transfusion Monitor hemoglobin, transfuse as needed Hb less than 8      DVT prophylaxis: SCD's Code Status: DNR Family Communication: Son Jerry [name] [phone] on 10/24 Disposition Plan: Status is: Inpatient  Remains inpatient appropriate because: Decompensated cirrhosis.  Hyperbilirubinemia.  Advance erotic changes.  Palliative care consult requested.       Level of care: Med-Surg  Consultants:  General surgery GI  Procedures:  None  Antimicrobials: Ceftriaxone Metronidazole   Subjective: Patient seen and examined.  Reports symptomatic improvement since paracentesis  Objective: Vitals:   04/29/21 1646 04/29/21 2054 04/30/21 0405 04/30/21 0937  BP: 116/63 (!) 106/53 (!) 109/48 106/64  Pulse: 72 71 62 64  Resp: 16 20 16 18   Temp: 98.6 F (37 C) 97.8 F (36.6 C) 98.3 F (36.8 C) 97.7 F (36.5 C)  TempSrc: Oral Oral Oral Oral  SpO2: 99% 95% 100% 98%  Weight:      Height:         Intake/Output Summary (Last 24 hours) at 04/30/2021 1004 Last data filed at 04/30/2021 1000 Gross per 24 hour  Intake 512.56 ml  Output 800 ml  Net -287.44 ml   Filed Weights   04/29/21 1116  Weight: 82.1 kg    Examination:  General exam: No acute distress.  Appears fatigued.  Appears chronically ill Respiratory system: Poor respiratory effort.  Bibasilar crackles.  Normal work of breathing.  Room air Cardiovascular system: S1-S2, RRR, no murmurs, no pedal edema Gastrointestinal system: Distended, tender to palpation RUQ, positive bowel sounds Central nervous system: Alert and oriented. No focal neurological deficits. Extremities: Symmetric 5 x 5 power. Skin: Icteric Psychiatry: Judgement and insight appear normal. Mood & affect appropriate.     Data Reviewed: I have personally reviewed following labs and imaging studies  CBC: Recent Labs  Lab 04/26/21 1124 04/28/21 1156 04/29/21 0613  WBC 9.1 8.4 8.6  HGB 10.0* 10.5* 10.3*  HCT 29.1* 29.7* 29.6*  MCV 100.7* 99.7 98.7  PLT 187 193 503   Basic Metabolic Panel: Recent Labs  Lab 04/26/21 1124 04/28/21 1156 04/29/21 0613  NA 136 138 142  K 4.4 4.1 4.1  CL 112* 110 117*  CO2 16* 15* 16*  GLUCOSE 64* 85 74  BUN 87* 92* 87*  CREATININE 2.19* 2.23* 2.25*  CALCIUM 8.7* 8.4* 8.7*   GFR: Estimated Creatinine Clearance: 32.1 mL/min (A) (by C-G formula based on SCr of 2.25 mg/dL (H)). Liver Function Tests: Recent Labs  Lab 04/26/21 1124 04/28/21 1156 04/29/21 0613  AST 239* 288* 278*  ALT 134* 153* 151*  ALKPHOS  227* 238* 205*  BILITOT 12.7* 13.3* 12.4*  PROT 6.1* 6.3* 6.0*  ALBUMIN 1.9* 2.0* 1.9*   Recent Labs  Lab 04/26/21 1124 04/28/21 1156  LIPASE 98* 130*   Recent Labs  Lab 04/26/21 1124 04/28/21 2205  AMMONIA 87* 100*   Coagulation Profile: Recent Labs  Lab 04/28/21 1454 04/29/21 0823  INR 8.1* 5.2*   Cardiac Enzymes: No results for input(s): CKTOTAL, CKMB, CKMBINDEX, TROPONINI  in the last 168 hours. BNP (last 3 results) No results for input(s): PROBNP in the last 8760 hours. HbA1C: No results for input(s): HGBA1C in the last 72 hours. CBG: Recent Labs  Lab 04/29/21 0858 04/29/21 1202 04/29/21 1646 04/29/21 2124 04/30/21 0906  GLUCAP 108* 155* 179* 230* 181*   Lipid Profile: No results for input(s): CHOL, HDL, LDLCALC, TRIG, CHOLHDL, LDLDIRECT in the last 72 hours. Thyroid Function Tests: No results for input(s): TSH, T4TOTAL, FREET4, T3FREE, THYROIDAB in the last 72 hours. Anemia Panel: No results for input(s): VITAMINB12, FOLATE, FERRITIN, TIBC, IRON, RETICCTPCT in the last 72 hours. Sepsis Labs: No results for input(s): PROCALCITON, LATICACIDVEN in the last 168 hours.  Recent Results (from the past 240 hour(s))  Resp Panel by RT-PCR (Flu A&B, Covid) Nasopharyngeal Swab     Status: None   Collection Time: 04/28/21  7:26 PM   Specimen: Nasopharyngeal Swab; Nasopharyngeal(NP) swabs in vial transport medium  Result Value Ref Range Status   SARS Coronavirus 2 by RT PCR NEGATIVE NEGATIVE Final    Comment: (NOTE) SARS-CoV-2 target nucleic acids are NOT DETECTED.  The SARS-CoV-2 RNA is generally detectable in upper respiratory specimens during the acute phase of infection. The lowest concentration of SARS-CoV-2 viral copies this assay can detect is 138 copies/mL. A negative result does not preclude SARS-Cov-2 infection and should not be used as the sole basis for treatment or other patient management decisions. A negative result may occur with  improper specimen collection/handling, submission of specimen other than nasopharyngeal swab, presence of viral mutation(s) within the areas targeted by this assay, and inadequate number of viral copies(<138 copies/mL). A negative result must be combined with clinical observations, patient history, and epidemiological information. The expected result is Negative.  Fact Sheet for Patients:   EntrepreneurPulse.com.au  Fact Sheet for Healthcare Providers:  IncredibleEmployment.be  This test is no t yet approved or cleared by the [place] FDA and  has been authorized for detection and/or diagnosis of SARS-CoV-2 by FDA under an Emergency Use Authorization (EUA). This EUA will remain  in effect (meaning this test can be used) for the duration of the COVID-19 declaration under Section 564(b)(1) of the Act, 21 U.S.C.section 360bbb-3(b)(1), unless the authorization is terminated  or revoked sooner.       Influenza A by PCR NEGATIVE NEGATIVE Final   Influenza B by PCR NEGATIVE NEGATIVE Final    Comment: (NOTE) The Xpert Xpress SARS-CoV-2/FLU/RSV plus assay is intended as an aid in the diagnosis of influenza from Nasopharyngeal swab specimens and should not be used as a sole basis for treatment. Nasal washings and aspirates are unacceptable for Xpert Xpress SARS-CoV-2/FLU/RSV testing.  Fact Sheet for Patients: EntrepreneurPulse.com.au  Fact Sheet for Healthcare Providers: IncredibleEmployment.be  This test is not yet approved or cleared by the [place] FDA and has been authorized for detection and/or diagnosis of SARS-CoV-2 by FDA under an Emergency Use Authorization (EUA). This EUA will remain in effect (meaning this test can be used) for the duration of the COVID-19 declaration under Section 564(b)(1) of the  Act, 21 U.S.C. section 360bbb-3(b)(1), unless the authorization is terminated or revoked.  Performed at [facility], [address] [place], [place] [address]   MRSA Next Gen by PCR, Nasal     Status: None   Collection Time: 04/28/21 11:05 PM   Specimen: Nasal Mucosa; Nasal Swab  Result Value Ref Range Status   MRSA by PCR Next Gen NOT DETECTED NOT DETECTED Final    Comment: (NOTE) The GeneXpert MRSA Assay (FDA approved for NASAL specimens only), is one component of a  comprehensive MRSA colonization surveillance program. It is not intended to diagnose MRSA infection nor to guide or monitor treatment for MRSA infections. Test performance is not FDA approved in patients less than 81 years old. Performed at [facility], [address] [place], [place] [address]   Body fluid culture w Gram Stain     Status: None (Preliminary result)   Collection Time: 04/29/21 11:28 AM   Specimen: PATH Cytology Peritoneal fluid  Result Value Ref Range Status   Specimen Description   Final    PERITONEAL Performed at [facility], [address] [place], [place] [address]    Special Requests   Final    NONE Performed at [facility], [address] [place], [place] [address]    Gram Stain   Final    WBC PRESENT, PREDOMINANTLY MONONUCLEAR NO ORGANISMS SEEN CYTOSPIN SMEAR    Culture   Final    NO GROWTH < 24 HOURS Performed at [facility], [place] [address] [place],  [address]    Report Status PENDING  Incomplete         Radiology Studies: CT ABDOMEN PELVIS WO CONTRAST  Result Date: 04/28/2021 CLINICAL DATA:  Abdominal abscess/infection suspected. Elevated lipase. EXAM: CT ABDOMEN AND PELVIS WITHOUT CONTRAST TECHNIQUE: Multidetector CT imaging of the abdomen and pelvis was performed following the standard protocol without IV contrast. COMPARISON:  04/02/2021 FINDINGS: Lower chest: Lung bases are unremarkable. Heart size is normal. There is dense atherosclerotic calcification the coronary arteries. Hepatobiliary: Moderate, increased ascites. Liver is nodular consistent with cirrhosis. No focal liver lesion. The gallbladder is contracted. Layering calcified gallstones are present. There is new air within the gallbladder lumen and nondilated common bile duct. Gallbladder wall appears thickened but is not fully assessed on CT. Pancreas: Unremarkable. No pancreatic ductal dilatation or surrounding inflammatory changes.  Spleen: Enlarged homogeneous spleen. Adrenals/Urinary Tract: RIGHT adrenal gland is normal. LEFT adrenal gland is normal. Coarse calcifications along the UPPER pole of LEFT kidney, stable in appearance. RIGHT kidney is normal. No hydronephrosis. Ureters are unremarkable. Urinary bladder is distended. Stomach/Bowel: Stomach and small bowel loops are normal in appearance. There is mild diffuse thickening of the colonic wall. Average stool burden. No colonic mass. The appendix is not well seen. Vascular/Lymphatic: There is atherosclerotic calcification of the abdominal aorta not associated with aneurysms. No retroperitoneal or mesenteric adenopathy. Numerous venous collaterals identified in the LEFT UPPER QUADRANT and anterior mesentery consistent with portal venous hypertension. Reproductive: Prostatic enlargement. Other: Moderate ascites.  Ascites extends to the RIGHT hemiscrotum. Musculoskeletal: Degenerative changes in the lumbar spine. No acute abnormality. IMPRESSION: 1. Increased ascites. 2. Cirrhotic morphology of the liver.  Portal venous hypertension. 3. Cholelithiasis.  Suspect gallbladder wall thickening. 4. There is interval development of air within the biliary tree and gallbladder lumen, suspicious for emphysematous cholecystitis. Consider further evaluation with MRCP. 5. RIGHT inguinal hernia with ascites extending into the RIGHT hemiscrotum. 6.  Aortic atherosclerosis.  (  ICD10-I70.0) 7. Prostatic enlargement. Electronically Signed   By: Nolon Nations M.D.   On: 04/28/2021 15:26   US Paracentesis  Result Date: 04/29/2021 INDICATION: Patient with a history of cirrhosis and recurrent ascites presents today for a diagnostic and therapeutic paracentesis. EXAM: ULTRASOUND GUIDED PARACENTESIS MEDICATIONS: 1% lidocaine 10 mL COMPLICATIONS: None immediate. PROCEDURE: Informed written consent was obtained from the patient after a discussion of the risks, benefits and alternatives to treatment. A timeout  was performed prior to the initiation of the procedure. Initial ultrasound scanning demonstrates a large amount of ascites within the left lower abdominal quadrant. The left lower abdomen was prepped and draped in the usual sterile fashion. 1% lidocaine was used for local anesthesia. Following this, a 19 gauge, 7-cm, Yueh catheter was introduced. An ultrasound image was saved for documentation purposes. The paracentesis was performed. The catheter was removed and a dressing was applied. The patient tolerated the procedure well without immediate post procedural complication. FINDINGS: A total of approximately 3.2 L of yellow fluid was removed. Samples were sent to the laboratory as requested by the clinical team. IMPRESSION: Successful ultrasound-guided paracentesis yielding 3.2 liters of peritoneal fluid. Read by: Soyla Dryer, NP Electronically Signed   By: Albin Felling M.D.   On: 04/29/2021 11:56   MR ABDOMEN MRCP W WO CONTAST  Result Date: 04/29/2021 CLINICAL DATA:  69 year old male with history of cholelithiasis on recent CT examination. Evaluate for potential choledocholithiasis. EXAM: MRI ABDOMEN WITHOUT AND WITH CONTRAST (INCLUDING MRCP) TECHNIQUE: Multiplanar multisequence MR imaging of the abdomen was performed both before and after the administration of intravenous contrast. Heavily T2-weighted images of the biliary and pancreatic ducts were obtained, and three-dimensional MRCP images were rendered by post processing. CONTRAST:  2m GADAVIST GADOBUTROL 1 MMOL/ML IV SOLN COMPARISON:  Abdominal MRI 04/02/2021. CT the abdomen and pelvis 04/28/2021. FINDINGS: Comment: Today's study is limited by extensive artifact from patient motion, rendering the MRCP images nondiagnostic. Lower chest: Mild elevation of the right hemidiaphragm. Otherwise, unremarkable. Hepatobiliary: Liver has a shrunken appearance and nodular contour, indicative of advanced cirrhosis. Tiny foci of hyperenhancement are again noted  in the right lobe of the liver measuring 7 mm (axial image 14 of series 18) and 5 mm (axial image 18 of series 18), stable compared to the prior examination. No other new suspicious appearing cystic or solid hepatic lesions. No intrahepatic biliary ductal dilatation confidently identified (MRCP images are essentially nondiagnostic). Common bile duct is dilated measuring up to 11 mm in the porta hepatis. There is a suggestion of some debris and/or stones within the common bile duct, however, this is difficult to confirm on today's motion limited examination. Small filling defects are noted within the gallbladder, compatible with cholelithiasis. Gallbladder is decompressed. Small amount of non dependent signal void within the gallbladder is compatible with pneumobilia, better demonstrated on preceding CT examination. Pancreas: No pancreatic mass. No pancreatic ductal dilatation. No peripancreatic fluid collections. Spleen: Spleen is enlarged measuring 16.4 x 6.6 x 14.8 cm (estimated splenic volume of 801 mL) . Adrenals/Urinary Tract: Subcentimeter T1 hypointense, T2 hyperintense, nonenhancing lesions in the right kidney are compatible with tiny simple cysts. Left kidney and bilateral adrenal glands are normal in appearance. No hydroureteronephrosis in the visualized portions of the abdomen Stomach/Bowel: Visualized portions are unremarkable. Vascular/Lymphatic: No aneurysm identified in the visualized abdominal vasculature. Chronic thrombosis of the portal vein is again noted, with reconstitution of flow in the left portal vein branch the collateral pathways, as there is continued cavernous transformation in  the porta hepatis. Right portal vein appears chronically occluded. Several prominent borderline enlarged and mildly enlarged lymph nodes are noted adjacent to the liver measuring up to 1.4 cm in short axis in the hepatoduodenal ligament nodal station. Other:  Large volume of ascites. Musculoskeletal: No aggressive  appearing osseous lesions are noted in the visualized portions of the skeleton. IMPRESSION: 1. Limited examination secondary to respiratory motion. Common bile duct is dilated up to 11 mm in the porta hepatis. There is a suggestion of debris and/or small gallstones within the common bile duct such that choledocholithiasis is not excluded. However, the lack of intrahepatic biliary ductal dilatation suggests against frank obstruction. 2. Cholelithiasis without definitive evidence to suggest an acute cholecystitis at this time. Small amount of pneumobilia in the gallbladder related to prior sphincterotomy performed on 04/03/2021. 3. Advanced morphologic changes of cirrhosis redemonstrated with small hypervascular lesions in the right lobe of the liver measuring 7 mm or less in size, stable compared to the prior study, once again categorized as LI-RADS 3. Repeat abdominal MRI with and without IV gadolinium is recommended in 6 months to ensure stability. 4. 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 5. Large volume of ascites. Electronically Signed   By: Vinnie Langton M.D.   On: 04/29/2021 06:18        Scheduled Meds:  camphor-menthol   Topical BID   carvedilol  3.125 mg Oral BID   cholestyramine light  4 g Oral q12n4p   feeding supplement (GLUCERNA SHAKE)  237 mL Oral BID BM   furosemide  20 mg Oral Daily   insulin aspart  0-15 Units Subcutaneous TID WC   insulin aspart  0-5 Units Subcutaneous QHS   isosorbide mononitrate  60 mg Oral Daily   isosorbide mononitrate  90 mg Oral BH-q7a   lactulose  10 g Oral BID   loratadine  10 mg Oral QHS   loratadine  5 mg Oral Daily   meclizine  25 mg Oral TID   metroNIDAZOLE  500 mg Oral Q8H   pantoprazole  40 mg Oral BID AC & HS   rifaximin  550 mg Oral BID   spironolactone  25 mg Oral Daily   Continuous Infusions:   sodium chloride     cefTRIAXone (ROCEPHIN)  IV 2 g (04/29/21 1741)     LOS: 2 days    Time spent: 25 minutes    Sidney Ace, MD [facility]   If 7PM-7AM, please contact night-coverage  04/30/2021, 10:04 AM

## 2021-04-30 NOTE — Progress Notes (Signed)
OT Cancellation Note  Patient Details Name: Jerry [name] MRN: [id number] DOB: [birth year]   Cancelled Treatment:    Reason Eval/Treat Not Completed: Medical issues which prohibited therapy. Per chart review, pt contraindicated for therapy at this time, 2/2 high INR values as stated in AOTA/APTA therapeutic guidelines.  Nino Glow, OTS   Nino Glow 04/30/2021, 8:31 AM

## 2021-04-30 NOTE — Progress Notes (Signed)
PT Cancellation Note  Patient Details Name: Jerry [name] MRN: [id number] DOB: [birth year]   Cancelled Treatment:    Reason Eval/Treat Not Completed: Medical issues which prohibited therapy. Orders received and chart reviewed. INR value at 5.2 which is outside of APTA guidelines for PT intervention. Will re-attempt once medically appropriate.    Salem Caster. Fairly IV, PT, DPT Physical Therapist- [facility]  04/30/2021, 9:09 AM

## 2021-04-30 NOTE — Progress Notes (Signed)
OT Cancellation Note  Patient Details Name: Jerry [name] MRN: [id number] DOB: 09-Mar-1951   Cancelled Treatment:    Reason Eval/Treat Not Completed: Other (comment) Per rounds, pt is now transitioning to comfort care. OT to SIGN OFF.   Darleen Crocker, [place], OTR/L , CBIS ascom [phone]  04/30/21, 11:21 AM

## 2021-04-30 NOTE — Progress Notes (Signed)
PT Cancellation Note  Patient Details Name: Jerry [name] MRN: [id number] DOB: 04/30/1951   Cancelled Treatment:    Reason Eval/Treat Not Completed: Other (comment). Per rounds pt to receive comfort care. PT to sign off orders.   Salem Caster. Fairly IV, PT, DPT Physical Therapist- [facility]  04/30/2021, 11:06 AM

## 2021-05-01 DIAGNOSIS — K810 Acute cholecystitis: Secondary | ICD-10-CM

## 2021-05-01 LAB — GLUCOSE, CAPILLARY
Glucose-Capillary: 129 mg/dL — ABNORMAL HIGH (ref 70–99)
Glucose-Capillary: 256 mg/dL — ABNORMAL HIGH (ref 70–99)
Glucose-Capillary: 178 mg/dL — ABNORMAL HIGH (ref 70–99)
Glucose-Capillary: 218 mg/dL — ABNORMAL HIGH (ref 70–99)

## 2021-05-01 MED ORDER — OXYCODONE HCL 5 MG PO TABS
5.0000 mg | ORAL_TABLET | ORAL | Status: DC | PRN
Start: 2021-05-01 — End: 2021-05-03
  Administered 2021-05-01 – 2021-05-03 (×6): 5 mg via ORAL
  Filled 2021-05-01 (×6): qty 1

## 2021-05-01 MED ORDER — HYDROMORPHONE HCL 1 MG/ML IJ SOLN: 0.5000 mg | INTRAMUSCULAR | Status: DC | PRN

## 2021-05-01 NOTE — Progress Notes (Signed)
[facility]) Hospital Liaison Note   Unfortunately, [facility]ospice [facility] is not able to offer a room today. Family and [facility] Manager aware hospital liaison will follow up tomorrow or sooner if patient is eligible and if a room becomes available.    Please do not hesitate to call with any hospice related questions.    Thank you for the opportunity to participate in this patient's care.   Bobbie "Loren Racer, RN, BSN [facility] Liaison [phone]

## 2021-05-01 NOTE — Progress Notes (Signed)
Palliative:  Consult received and chart reviewed. Appears goals are clear and patient has been referred to hospice. No needs identified for palliative medicine team.   Please contact PMT if needs arise we can assist with.  Juel Burrow, DNP, AGNP-C Palliative Medicine Team Team Phone # [phone]  Pager # [phone]  NO CHARGE

## 2021-05-01 NOTE — Progress Notes (Signed)
PROGRESS NOTE    Jerry [name]  DXA:[id number] DOB: Jun 28, 1951 DOA: 04/28/2021 PCP: [facility]    Brief Narrative:  70 y.o. male with medical history significant of liver cirrhosis, coronary artery disease, GERD, kidney cancer status post left nephrectomy, PTSD, hyperlipidemia, essential hypertension diabetes and GERD who presents to the ER with abdominal pain that was sudden periumbilical moving to the right upper quadrant.  Associated with some nausea but no vomiting.  Denies any fever or chills.  Patient also has significant ascites with involvement of his respiratory system.  Patient was seen and evaluated in the ER.  Pain was 7 out of 10.  No melena.  No radiation.  No bright red blood per rectum.  No hemoptysis or hematemesis.  Was found to have possible cholecystitis.  Associated with also significant ascites and coagulopathy with INR of more than 8.  Patient has decompensated cirrhosis.  General surgery consulted on admission.  Recommended MRCP for further evaluation.  At this point patient not a surgical candidate.  MRCP revealing pneumobilia likely secondary to fairly recent ERCP on 9/28.  Patient has elevated total and direct bilirubin, transaminitis, coagulopathy.  Also has ascites.  Recent paracentesis done on 10/21.  Recurrent ascites noted on evaluation today.  MRCP with dilated common bile duct, 11 mm within porta hepatis.  No clear evidence of obstruction however choledocholithiasis is not excluded.  Gastroenterology consult requested.  GI consulted.  Recommendations appreciated.  Per GI likely no benefit to repeat ERCP.  Bilirubin and LFTs have not decreased since prior ERCP.  No clear evidence of obstruction on MRCP.  After conversations with the patient he desires to "be made comfortable".  I requested consultation from palliative care and will discuss with [facility] regarding engagement of hospice liaison.  10/26 hospice evaluated pt.   Assessment & Plan:   Principal  Problem:   Acute emphysematous cholecystitis Active Problems:   Type 2 diabetes mellitus without complication, without long-term current use of insulin (HCC)   Coronary artery disease due to lipid rich plaque   Essential hypertension   Liver cirrhosis secondary to NASH [facility])   Coronary artery disease involving native coronary artery of native heart with angina pectoris (HCC)   Portal hypertension (HCC)   GERD (gastroesophageal reflux disease)   Decompensated liver disease (HCC)   AKI (acute kidney injury) ([place])   Malnutrition of moderate degree   Coagulopathy (HCC)   Ascites of liver   Abnormal liver enzymes  Decompensated hepatic cirrhosis with ascites Coagulopathy Transaminitis Patient with recent paracentesis, 10/21 Presents with abdominal pain and a recurrent ascites Ultrasound-guided paracentesis completed, 3.2 L fluid 10/26 continue metron. And ciproflx. Continue lasix, aldactone, lactulose, xifaxan Pt with poor px.  Hospice consulted for inpt hospice    Hyperbilirubinemia CBD dilatation Patient with elevated total and direct bilirubin Could be result of decompensated cirrhosis CBD stone not excluded, CBD dilated to 11 mm within porta hepatis MRCP with no evidence of obstruction Patient stable, afebrile, not acutely cholangitic on presentation GI consulted, no indication for repeat ERCP 10/26 continue empiric antibiotics Continue supportive care 10/26 monitor  Pneumobilia Initially concern for emphysematous cholecystitis No clear evidence of this on MRCP Patient not a surgical candidate Pneumobilia likely secondary to recent instrumentation Plan: Monitor  Severe coagulopathy Chronic portal vein thrombosis Secondary to advanced liver disease in the setting of chronic anticoagulation on Eliquis 10/26 holding Eliquis Last INR had decreased to five-point  Acute kidney injury Patient with normal baseline creatinine Creatinine on presentation 2.23  Prerenal  azotemia versus ATN Hepatorenal syndrome remains on differential, however urine sodium is normal 10/26-hospice evaluating patient No need for evaluation by nephrology   Chronic anticoagulation Chronic portal vein thrombosis Eliquis on hold  Coronary artery disease Stable at this point No evidence of decompensation Plan: Continue home Coreg Continue home Imdur Cautious while on diuretic  GERD PPI  Severe protein calorie malnutrition Nutrition consult  Central hypertension PTA Coreg and Imdur  Anemia of chronic liver disease Appears at or near baseline No negation for transfusion Monitor hemoglobin, transfuse as needed Hb less than 8      DVT prophylaxis: SCD's Code Status: DNR Family Communication: I bedside Disposition Plan: Status is: Inpatient  Remains inpatient appropriate because: Decompensated cirrhosis.  Hyperbilirubinemia.  Advance erotic changes.  Inpatient hospice consulted.  Case management working for authorization to see if patient qualifies      Level of care: Med-Surg  Consultants:  General surgery GI  Procedures:  None  Antimicrobials: Ciprofloxacin Metronidazole   Subjective: Patient reports abdominal soreness.  No shortness of breath.  Objective: Vitals:   04/30/21 1420 04/30/21 1944 05/01/21 0617 05/01/21 0851  BP: 113/60 (!) 107/58 (!) 96/52 (!) 104/57  Pulse: 69 71 (!) 58 (!) 58  Resp: 20 20 16 18   Temp: 99.4 F (37.4 C) 99.5 F (37.5 C) 98.5 F (36.9 C) 97.6 F (36.4 C)  TempSrc: Oral Oral Oral Oral  SpO2: 98% 99% 100% 100%  Weight:      Height:        Intake/Output Summary (Last 24 hours) at 05/01/2021 1203 Last data filed at 05/01/2021 1037 Gross per 24 hour  Intake 600 ml  Output --  Net 600 ml   Filed Weights   04/29/21 1116  Weight: 82.1 kg    Examination: Jaundice, NAD CTA anteriorly no wheezing Regular S1-S2 no gallops Distended mild tenderness with palpation due to soreness positive bowel  sounds Positive LE edema Grossly intact    Data Reviewed: I have personally reviewed following labs and imaging studies  CBC: Recent Labs  Lab 04/26/21 1124 04/28/21 1156 04/29/21 0613  WBC 9.1 8.4 8.6  HGB 10.0* 10.5* 10.3*  HCT 29.1* 29.7* 29.6*  MCV 100.7* 99.7 98.7  PLT 187 193 564   Basic Metabolic Panel: Recent Labs  Lab 04/26/21 1124 04/28/21 1156 04/29/21 0613  NA 136 138 142  K 4.4 4.1 4.1  CL 112* 110 117*  CO2 16* 15* 16*  GLUCOSE 64* 85 74  BUN 87* 92* 87*  CREATININE 2.19* 2.23* 2.25*  CALCIUM 8.7* 8.4* 8.7*   GFR: Estimated Creatinine Clearance: 32.1 mL/min (A) (by C-G formula based on SCr of 2.25 mg/dL (H)). Liver Function Tests: Recent Labs  Lab 04/26/21 1124 04/28/21 1156 04/29/21 0613  AST 239* 288* 278*  ALT 134* 153* 151*  ALKPHOS 227* 238* 205*  BILITOT 12.7* 13.3* 12.4*  PROT 6.1* 6.3* 6.0*  ALBUMIN 1.9* 2.0* 1.9*   Recent Labs  Lab 04/26/21 1124 04/28/21 1156  LIPASE 98* 130*   Recent Labs  Lab 04/26/21 1124 04/28/21 2205  AMMONIA 87* 100*   Coagulation Profile: Recent Labs  Lab 04/28/21 1454 04/29/21 0823  INR 8.1* 5.2*   Cardiac Enzymes: No results for input(s): CKTOTAL, CKMB, CKMBINDEX, TROPONINI in the last 168 hours. BNP (last 3 results) No results for input(s): PROBNP in the last 8760 hours. HbA1C: No results for input(s): HGBA1C in the last 72 hours. CBG: Recent Labs  Lab 04/30/21 1140 04/30/21 1637 04/30/21 2132  05/01/21 0821 05/01/21 1130  GLUCAP 221* 230* 145* 129* 256*   Lipid Profile: No results for input(s): CHOL, HDL, LDLCALC, TRIG, CHOLHDL, LDLDIRECT in the last 72 hours. Thyroid Function Tests: No results for input(s): TSH, T4TOTAL, FREET4, T3FREE, THYROIDAB in the last 72 hours. Anemia Panel: No results for input(s): VITAMINB12, FOLATE, FERRITIN, TIBC, IRON, RETICCTPCT in the last 72 hours. Sepsis Labs: No results for input(s): PROCALCITON, LATICACIDVEN in the last 168 hours.  Recent  Results (from the past 240 hour(s))  Resp Panel by RT-PCR (Flu A&B, Covid) Nasopharyngeal Swab     Status: None   Collection Time: 04/28/21  7:26 PM   Specimen: Nasopharyngeal Swab; Nasopharyngeal(NP) swabs in vial transport medium  Result Value Ref Range Status   SARS Coronavirus 2 by RT PCR NEGATIVE NEGATIVE Final    Comment: (NOTE) SARS-CoV-2 target nucleic acids are NOT DETECTED.  The SARS-CoV-2 RNA is generally detectable in upper respiratory specimens during the acute phase of infection. The lowest concentration of SARS-CoV-2 viral copies this assay can detect is 138 copies/mL. A negative result does not preclude SARS-Cov-2 infection and should not be used as the sole basis for treatment or other patient management decisions. A negative result may occur with  improper specimen collection/handling, submission of specimen other than nasopharyngeal swab, presence of viral mutation(s) within the areas targeted by this assay, and inadequate number of viral copies(<138 copies/mL). A negative result must be combined with clinical observations, patient history, and epidemiological information. The expected result is Negative.  Fact Sheet for Patients:  EntrepreneurPulse.com.au  Fact Sheet for Healthcare Providers:  IncredibleEmployment.be  This test is no t yet approved or cleared by the [place] FDA and  has been authorized for detection and/or diagnosis of SARS-CoV-2 by FDA under an Emergency Use Authorization (EUA). This EUA will remain  in effect (meaning this test can be used) for the duration of the COVID-19 declaration under Section 564(b)(1) of the Act, 21 U.S.C.section 360bbb-3(b)(1), unless the authorization is terminated  or revoked sooner.       Influenza A by PCR NEGATIVE NEGATIVE Final   Influenza B by PCR NEGATIVE NEGATIVE Final    Comment: (NOTE) The Xpert Xpress SARS-CoV-2/FLU/RSV plus assay is intended as an aid in the  diagnosis of influenza from Nasopharyngeal swab specimens and should not be used as a sole basis for treatment. Nasal washings and aspirates are unacceptable for Xpert Xpress SARS-CoV-2/FLU/RSV testing.  Fact Sheet for Patients: EntrepreneurPulse.com.au  Fact Sheet for Healthcare Providers: IncredibleEmployment.be  This test is not yet approved or cleared by the [place] FDA and has been authorized for detection and/or diagnosis of SARS-CoV-2 by FDA under an Emergency Use Authorization (EUA). This EUA will remain in effect (meaning this test can be used) for the duration of the COVID-19 declaration under Section 564(b)(1) of the Act, 21 U.S.C. section 360bbb-3(b)(1), unless the authorization is terminated or revoked.  Performed at [facility], [address] [place], [place] [address]   MRSA Next Gen by PCR, Nasal     Status: None   Collection Time: 04/28/21 11:05 PM   Specimen: Nasal Mucosa; Nasal Swab  Result Value Ref Range Status   MRSA by PCR Next Gen NOT DETECTED NOT DETECTED Final    Comment: (NOTE) The GeneXpert MRSA Assay (FDA approved for NASAL specimens only), is one component of a comprehensive MRSA colonization surveillance program. It is not intended to diagnose MRSA infection nor to guide or monitor treatment for MRSA infections. Test performance  is not FDA approved in patients less than 59 years old. Performed at [facility], [address] [place], [place] [address]   Body fluid culture w Gram Stain     Status: None (Preliminary result)   Collection Time: 04/29/21 11:28 AM   Specimen: PATH Cytology Peritoneal fluid  Result Value Ref Range Status   Specimen Description   Final    PERITONEAL Performed at [facility] [address] [place], [place] [address]    Special Requests   Final    NONE Performed at [facility], [address] [place], [place] [address]    Gram  Stain   Final    WBC PRESENT, PREDOMINANTLY MONONUCLEAR NO ORGANISMS SEEN CYTOSPIN SMEAR    Culture   Final    NO GROWTH 2 DAYS Performed at [facility], [place] [address], [place], [place] [address]    Report Status PENDING  Incomplete         Radiology Studies: No results found.      Scheduled Meds:  camphor-menthol   Topical BID   carvedilol  3.125 mg Oral BID   cholestyramine light  4 g Oral q12n4p   ciprofloxacin  500 mg Oral BID   feeding supplement (GLUCERNA SHAKE)  237 mL Oral BID BM   furosemide  20 mg Oral Daily   insulin aspart  0-15 Units Subcutaneous TID WC   insulin aspart  0-5 Units Subcutaneous QHS   isosorbide mononitrate  90 mg Oral BH-q7a   lactulose  10 g Oral BID   loratadine  10 mg Oral QHS   loratadine  5 mg Oral Daily   meclizine  25 mg Oral TID   metroNIDAZOLE  500 mg Oral Q8H   pantoprazole  40 mg Oral BID AC & HS   rifaximin  550 mg Oral BID   spironolactone  25 mg Oral Daily   Continuous Infusions:  sodium chloride       LOS: 3 days    Time spent: 35 min with >50% on coc    Nolberto Hanlon, MD [facility]   If 7PM-7AM, please contact night-coverage  05/01/2021, 12:03 PM

## 2021-05-02 DIAGNOSIS — K810 Acute cholecystitis: Secondary | ICD-10-CM | POA: Diagnosis not present

## 2021-05-02 LAB — BODY FLUID CULTURE W GRAM STAIN: Culture: NO GROWTH

## 2021-05-02 LAB — GLUCOSE, CAPILLARY: Glucose-Capillary: 158 mg/dL — ABNORMAL HIGH (ref 70–99)

## 2021-05-02 NOTE — Progress Notes (Signed)
Patient ID: Jerry [name], male   DOB: 01-Feb-1951, 70 y.o.   MRN: [id number]  PROGRESS NOTE    Jerry [name]  PPJ:[id number] DOB: August 12, 1950 DOA: 04/28/2021 PCP: [facility]    Brief Narrative:  70 y.o. male with medical history significant of liver cirrhosis, coronary artery disease, GERD, kidney cancer status post left nephrectomy, PTSD, hyperlipidemia, essential hypertension diabetes and GERD who presents to the ER with abdominal pain that was sudden periumbilical moving to the right upper quadrant.  Associated with some nausea but no vomiting.  Denies any fever or chills.  Patient also has significant ascites with involvement of his respiratory system.  Patient was seen and evaluated in the ER.  Pain was 7 out of 10.  No melena.  No radiation.  No bright red blood per rectum.  No hemoptysis or hematemesis.  Was found to have possible cholecystitis.  Associated with also significant ascites and coagulopathy with INR of more than 8.  Patient has decompensated cirrhosis.  General surgery consulted on admission.  Recommended MRCP for further evaluation.  At this point patient not a surgical candidate.  MRCP revealing pneumobilia likely secondary to fairly recent ERCP on 9/28.  Patient has elevated total and direct bilirubin, transaminitis, coagulopathy.  Also has ascites.  Recent paracentesis done on 10/21.  Recurrent ascites noted on evaluation today.  MRCP with dilated common bile duct, 11 mm within porta hepatis.  No clear evidence of obstruction however choledocholithiasis is not excluded.  Gastroenterology consult requested.  GI consulted.  Recommendations appreciated.  Per GI likely no benefit to repeat ERCP.  Bilirubin and LFTs have not decreased since prior ERCP.  No clear evidence of obstruction on MRCP.  After conversations with the patient he desires to "be made comfortable".  I requested consultation from palliative care and will discuss with [name] regarding engagement of hospice  liaison.  10/26 hospice evaluated pt.  10/27 has been accepted to hospice house no bed available today.  Hospice spoke to patient and SO Jerry [name] and they have decided on comfort measures. To stop lab checks, sugar checks and only keep necessary meds for now.    Assessment & Plan:   Principal Problem:   Acute emphysematous cholecystitis Active Problems:   Type 2 diabetes mellitus without complication, without long-term current use of insulin (HCC)   Coronary artery disease due to lipid rich plaque   Essential hypertension   Liver cirrhosis secondary to NASH [facility])   Coronary artery disease involving native coronary artery of native heart with angina pectoris (HCC)   Portal hypertension (HCC)   GERD (gastroesophageal reflux disease)   Decompensated liver disease (HCC)   AKI (acute kidney injury) ([place])   Malnutrition of moderate degree   Coagulopathy (HCC)   Ascites of liver   Abnormal liver enzymes  Decompensated hepatic cirrhosis with ascites Coagulopathy Transaminitis Patient with recent paracentesis, 10/21 Presents with abdominal pain and a recurrent ascites Ultrasound-guided paracentesis completed, 3.2 L fluid 10/26 continue metron. And ciproflx. Continue lasix, aldactone, lactulose, xifaxan Pt with poor px.  10/27 moving forward with comfort measures, will stop blood draws and bg checks. Stopped some meds, keep necessary meds.  Has been accepted to hospice house, no bed today    Hyperbilirubinemia CBD dilatation Patient with elevated total and direct bilirubin Could be result of decompensated cirrhosis CBD stone not excluded, CBD dilated to 11 mm within porta hepatis MRCP with no evidence of obstruction Patient stable, afebrile, not acutely cholangitic on presentation GI consulted,  no indication for repeat ERCP 10/27 continue empiric antibiotics for now until going to hospice    Pneumobilia Initially concern for emphysematous cholecystitis No clear evidence of  this on MRCP Patient not a surgical candidate Pneumobilia likely secondary to recent instrumentation 10/27 continue to monitor   Severe coagulopathy Chronic portal vein thrombosis Secondary to advanced liver disease in the setting of chronic anticoagulation on Eliquis 10/27 holding Eliquis No further INR checks  Acute kidney injury Patient with normal baseline creatinine Creatinine on presentation 2.23 Prerenal azotemia versus ATN Hepatorenal syndrome remains on differential, however urine sodium is normal 10/26-hospice evaluating patient No need for evaluation by nephrology   Chronic anticoagulation Chronic portal vein thrombosis Eliquis on hold  Coronary artery disease Stable at this point No evidence of decompensation Plan: Continue home Coreg Continue home Imdur Cautious while on diuretic  GERD PPI  Severe protein calorie malnutrition Nutrition consult  Central hypertension PTA Coreg and Imdur  Anemia of chronic liver disease Appears at or near baseline No negation for transfusion Monitor hemoglobin, transfuse as needed Hb less than 8      DVT prophylaxis: SCD's Code Status: DNR Family Communication: None at bedside Disposition Plan: Status is: Inpatient  Remains inpatient appropriate because: Decompensated cirrhosis.  Hyperbilirubinemia.  Advance erotic changes.  Inpatient hospice consulted.  Case management working for authorization to see if patient qualifies  Plan to go to hospice house when bed available    Level of care: Med-Surg  Consultants:  General surgery GI  Procedures:  None  Antimicrobials: Ciprofloxacin Metronidazole   Subjective: Patient has no appetite.  Otherwise has no new complaints  Objective: Vitals:   05/01/21 1531 05/01/21 1950 05/02/21 0328 05/02/21 0825  BP: 114/65 104/63 105/61 100/63  Pulse: 69 62 (!) 56 (!) 58  Resp: 18 18 20 18   Temp: 98.7 F (37.1 C) 98 F (36.7 C) (!) 97.5 F (36.4 C) 97.6 F  (36.4 C)  TempSrc: Oral Oral Oral Oral  SpO2: 100% 100% 100% 100%  Weight:      Height:       No intake or output data in the 24 hours ending 05/02/21 1159  Filed Weights   04/29/21 1116  Weight: 82.1 kg    Examination: Drowsy, NAD CTA no wheeze Regular S1-S2 no gallops Soft distended positive bowel sounds Mild edema bilaterally Mood and affect appropriate in current setting    Data Reviewed: I have personally reviewed following labs and imaging studies  CBC: Recent Labs  Lab 04/26/21 1124 04/28/21 1156 04/29/21 0613  WBC 9.1 8.4 8.6  HGB 10.0* 10.5* 10.3*  HCT 29.1* 29.7* 29.6*  MCV 100.7* 99.7 98.7  PLT 187 193 161   Basic Metabolic Panel: Recent Labs  Lab 04/26/21 1124 04/28/21 1156 04/29/21 0613  NA 136 138 142  K 4.4 4.1 4.1  CL 112* 110 117*  CO2 16* 15* 16*  GLUCOSE 64* 85 74  BUN 87* 92* 87*  CREATININE 2.19* 2.23* 2.25*  CALCIUM 8.7* 8.4* 8.7*   GFR: Estimated Creatinine Clearance: 32.1 mL/min (A) (by C-G formula based on SCr of 2.25 mg/dL (H)). Liver Function Tests: Recent Labs  Lab 04/26/21 1124 04/28/21 1156 04/29/21 0613  AST 239* 288* 278*  ALT 134* 153* 151*  ALKPHOS 227* 238* 205*  BILITOT 12.7* 13.3* 12.4*  PROT 6.1* 6.3* 6.0*  ALBUMIN 1.9* 2.0* 1.9*   Recent Labs  Lab 04/26/21 1124 04/28/21 1156  LIPASE 98* 130*   Recent Labs  Lab 04/26/21 1124 04/28/21  2205  AMMONIA 87* 100*   Coagulation Profile: Recent Labs  Lab 04/28/21 1454 04/29/21 0823  INR 8.1* 5.2*   Cardiac Enzymes: No results for input(s): CKTOTAL, CKMB, CKMBINDEX, TROPONINI in the last 168 hours. BNP (last 3 results) No results for input(s): PROBNP in the last 8760 hours. HbA1C: No results for input(s): HGBA1C in the last 72 hours. CBG: Recent Labs  Lab 05/01/21 0821 05/01/21 1130 05/01/21 1633 05/01/21 2103 05/02/21 0810  GLUCAP 129* 256* 178* 218* 158*   Lipid Profile: No results for input(s): CHOL, HDL, LDLCALC, TRIG, CHOLHDL,  LDLDIRECT in the last 72 hours. Thyroid Function Tests: No results for input(s): TSH, T4TOTAL, FREET4, T3FREE, THYROIDAB in the last 72 hours. Anemia Panel: No results for input(s): VITAMINB12, FOLATE, FERRITIN, TIBC, IRON, RETICCTPCT in the last 72 hours. Sepsis Labs: No results for input(s): PROCALCITON, LATICACIDVEN in the last 168 hours.  Recent Results (from the past 240 hour(s))  Resp Panel by RT-PCR (Flu A&B, Covid) Nasopharyngeal Swab     Status: None   Collection Time: 04/28/21  7:26 PM   Specimen: Nasopharyngeal Swab; Nasopharyngeal(NP) swabs in vial transport medium  Result Value Ref Range Status   SARS Coronavirus 2 by RT PCR NEGATIVE NEGATIVE Final    Comment: (NOTE) SARS-CoV-2 target nucleic acids are NOT DETECTED.  The SARS-CoV-2 RNA is generally detectable in upper respiratory specimens during the acute phase of infection. The lowest concentration of SARS-CoV-2 viral copies this assay can detect is 138 copies/mL. A negative result does not preclude SARS-Cov-2 infection and should not be used as the sole basis for treatment or other patient management decisions. A negative result may occur with  improper specimen collection/handling, submission of specimen other than nasopharyngeal swab, presence of viral mutation(s) within the areas targeted by this assay, and inadequate number of viral copies(<138 copies/mL). A negative result must be combined with clinical observations, patient history, and epidemiological information. The expected result is Negative.  Fact Sheet for Patients:  EntrepreneurPulse.com.au  Fact Sheet for Healthcare Providers:  IncredibleEmployment.be  This test is no t yet approved or cleared by the [place] FDA and  has been authorized for detection and/or diagnosis of SARS-CoV-2 by FDA under an Emergency Use Authorization (EUA). This EUA will remain  in effect (meaning this test can be used) for the  duration of the COVID-19 declaration under Section 564(b)(1) of the Act, 21 U.S.C.section 360bbb-3(b)(1), unless the authorization is terminated  or revoked sooner.       Influenza A by PCR NEGATIVE NEGATIVE Final   Influenza B by PCR NEGATIVE NEGATIVE Final    Comment: (NOTE) The Xpert Xpress SARS-CoV-2/FLU/RSV plus assay is intended as an aid in the diagnosis of influenza from Nasopharyngeal swab specimens and should not be used as a sole basis for treatment. Nasal washings and aspirates are unacceptable for Xpert Xpress SARS-CoV-2/FLU/RSV testing.  Fact Sheet for Patients: EntrepreneurPulse.com.au  Fact Sheet for Healthcare Providers: IncredibleEmployment.be  This test is not yet approved or cleared by the [place] FDA and has been authorized for detection and/or diagnosis of SARS-CoV-2 by FDA under an Emergency Use Authorization (EUA). This EUA will remain in effect (meaning this test can be used) for the duration of the COVID-19 declaration under Section 564(b)(1) of the Act, 21 U.S.C. section 360bbb-3(b)(1), unless the authorization is terminated or revoked.  Performed at [facility], [address] [place], [place] [address]   MRSA Next Gen by PCR, Nasal     Status: None  Collection Time: 04/28/21 11:05 PM   Specimen: Nasal Mucosa; Nasal Swab  Result Value Ref Range Status   MRSA by PCR Next Gen NOT DETECTED NOT DETECTED Final    Comment: (NOTE) The GeneXpert MRSA Assay (FDA approved for NASAL specimens only), is one component of a comprehensive MRSA colonization surveillance program. It is not intended to diagnose MRSA infection nor to guide or monitor treatment for MRSA infections. Test performance is not FDA approved in patients less than 87 years old. Performed at [facility], [address] [place], [place] [address]   Body fluid culture w Gram Stain     Status: None   Collection Time:  04/29/21 11:28 AM   Specimen: PATH Cytology Peritoneal fluid  Result Value Ref Range Status   Specimen Description   Final    PERITONEAL Performed at [facility], [address] [place], [place] [address]    Special Requests   Final    NONE Performed at [facility], [address] [place], [place] [address]    Gram Stain   Final    WBC PRESENT, PREDOMINANTLY MONONUCLEAR NO ORGANISMS SEEN CYTOSPIN SMEAR    Culture   Final    NO GROWTH 3 DAYS Performed at [facility], [place] [address] [place], [place] [address]    Report Status 05/02/2021 FINAL  Final         Radiology Studies: No results found.      Scheduled Meds:  camphor-menthol   Topical BID   cholestyramine light  4 g Oral q12n4p   ciprofloxacin  500 mg Oral BID   feeding supplement (GLUCERNA SHAKE)  237 mL Oral BID BM   furosemide  20 mg Oral Daily   lactulose  10 g Oral BID   loratadine  10 mg Oral QHS   loratadine  5 mg Oral Daily   meclizine  25 mg Oral TID   metroNIDAZOLE  500 mg Oral Q8H   pantoprazole  40 mg Oral BID AC & HS   rifaximin  550 mg Oral BID   spironolactone  25 mg Oral Daily   Continuous Infusions:  sodium chloride       LOS: 4 days    Time spent: 35 min with >50% on coc    Nolberto Hanlon, MD [facility]   If 7PM-7AM, please contact night-coverage  05/02/2021, 11:59 AM

## 2021-05-02 NOTE — Progress Notes (Signed)
Manufacturing engineer [facility])  Mr. Sobel is approved for residential hospice at [facility].   There is not a bed to offer today.  Once our bed availability changes, [facility] will update hospital staff and family.  Thank you, Venia Carbon BSN, RN [facility] Liaison

## 2021-05-03 DIAGNOSIS — K810 Acute cholecystitis: Secondary | ICD-10-CM | POA: Diagnosis not present

## 2021-05-03 NOTE — TOC Transition Note (Signed)
Transition of Care [facility]) - CM/SW Discharge Note   Patient Details  Name: Jerry [name] MRN: [id number] Date of Birth: [birth year]  Transition of Care [facility]) CM/SW Contact:  Candie Chroman, LCSW Phone Number: 05/03/2021, 11:55 AM   Clinical Narrative:  Patient has orders to discharge to [facility] today. RN will call report to [phone]. Hospice liaison will set up EMS transport and notify family. No further concerns. CSW signing off.   Final next level of care: [place] Barriers to Discharge: Barriers Resolved   Patient Goals and CMS Choice        Discharge Placement                Patient to be transferred to facility by: EMS   Patient and family notified of of transfer: 05/03/21  Discharge Plan and Services                                     Social Determinants of Health (SDOH) Interventions     Readmission Risk Interventions Readmission Risk Prevention Plan 04/29/2021  Transportation Screening Complete  Palliative Care Screening Complete  Medication Review (RN Care Manager) Complete  Some recent data might be hidden

## 2021-05-03 NOTE — Progress Notes (Addendum)
[facility] [facility]) Hospital Liaison Note  Hospice Home is able to offer a room today and family agreeable to transfer today. Dayton Scrape, LCSW [facility] Manager aware.   RN please call report to [place] at [phone] prior to patient leaving the unit.  Please send signed and completed DNR with patient at discharge.   Please do not hesitate to call with any hospice related questions.    Thank you for the opportunity to participate in this patient's care.   Bobbie "Loren Racer, RN, BSN [facility] Liaison [phone]

## 2021-05-03 NOTE — Discharge Summary (Signed)
Jerry [name] BPZ:[id number] DOB: 03-13-1951 DOA: 04/28/2021  PCP: [facility]  Admit date: 04/28/2021 Discharge date: 05/03/2021  Admitted From: home Disposition:  hospice house    Discharge Condition:Guarded CODE STATUS:DNR  Diet recommendation: carb modified  Brief/Interim Summary: Per HPI: Jerry [name] is a 70 y.o. 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 Was found to have possible cholecystitis.  Associated with also significant ascites and coagulopathy with INR of more than 8.  Patient has decompensated cirrhosis.   General surgery consulted on admission.  Recommended MRCP for further evaluation.  At this point patient not a surgical candidate.  MRCP revealing pneumobilia likely secondary to fairly recent ERCP on 9/28.  Patient has elevated total and direct bilirubin, transaminitis, coagulopathy.  Also has ascites.  Recent paracentesis done on 10/21.  Recurrent ascites noted on evaluation .MRCP with dilated common bile duct, 11 mm within porta hepatis.  No clear evidence of obstruction however choledocholithiasis is not excluded.  Gastroenterology consult was requested.Per GI likely no benefit to repeat ERCP.  Bilirubin and LFTs have not decreased since prior ERCP.  No clear evidence of obstruction on MRCP.  Desire to become comfort care.  Hospice Was consulted and he will be discharged to hospice house today due to his poor prognosis.   Decompensated hepatic cirrhosis with ascites Coagulopathy Transaminitis Patient with recent paracentesis, 10/21 Presents with abdominal pain and a recurrent  ascites Ultrasound-guided paracentesis completed, 3.2 L fluid 10/26 continue metron. And ciproflx. Continue lasix, aldactone, lactulose, xifaxan Pt with poor px.  10/27 moving forward with comfort measures, will stop blood draws and bg checks. Stopped some meds, keep necessary meds.  Has been accepted to hospice house, no bed today       Hyperbilirubinemia CBD dilatation Patient with elevated total and direct bilirubin Could be result of decompensated cirrhosis CBD stone not excluded, CBD dilated to 11 mm within porta hepatis MRCP with no evidence of obstruction Was treated with GI consulted, no indication for repeat ERCP 10/27 continue empiric antibiotics      Pneumobilia Initially concern for emphysematous cholecystitis No clear evidence of this on MRCP Patient not a surgical candidate Pneumobilia likely secondary to recent instrumentation    Severe coagulopathy Chronic portal vein thrombosis Secondary to advanced liver disease in the setting of chronic anticoagulation on Eliquis Eliquis was discontinued   Acute kidney injury Patient with normal baseline creatinine Creatinine on presentation 2.23 Prerenal azotemia versus ATN Hepatorenal syndrome remains on differential, however urine sodium is normal      Chronic anticoagulation Chronic portal vein thrombosis Eliquis on hold   Coronary artery disease Stable at this point No evidence of decompensation  GERD Was on PPI   Severe protein calorie malnutrition Nutrition consulted    Central hypertension Was on Coreg and Imdur -currently discontinued   Anemia of chronic liver disease      Discharge Diagnoses:  Principal Problem:   Acute emphysematous cholecystitis Active Problems:   Type 2 diabetes mellitus without complication, without long-term current use of insulin (HCC)   Coronary artery disease due to lipid rich plaque   Essential hypertension   Liver cirrhosis secondary to NASH ([place])  Coronary  artery disease involving native coronary artery of native heart with angina pectoris (HCC)   Portal hypertension (HCC)   GERD (gastroesophageal reflux disease)   Decompensated liver disease (HCC)   AKI (acute kidney injury) ([place])   Malnutrition of moderate degree   Coagulopathy (HCC)   Ascites of liver   Abnormal liver enzymes    Discharge Instructions  Discharge Instructions     Diet - low sodium heart healthy   Complete by: As directed    Increase activity slowly   Complete by: As directed       Allergies as of 05/03/2021       Reactions   Atorvastatin Other (See Comments)   Unknown   Metoprolol Other (See Comments)   bradycardia   Penicillins Swelling   Swelling at injection site Has patient had a PCN reaction causing immediate rash, facial/tongue/throat swelling, SOB or lightheadedness with hypotension: No Has patient had a PCN reaction causing severe rash involving mucus membranes or skin necrosis: No Has patient had a PCN reaction that required hospitalization No Has patient had a PCN reaction occurring within the last 10 years: No If all of the above answers are "NO", then may proceed with Cephalosporin use.   Rosuvastatin Other (See Comments)   unknown   Simvastatin Other (See Comments)   unknown   Terazosin Other (See Comments)   Orthostatic hypotension        Medication List     STOP taking these medications    apixaban 5 MG Tabs tablet Commonly known as: ELIQUIS   carvedilol 3.125 MG tablet Commonly known as: COREG   cetirizine 5 MG tablet Commonly known as: ZYRTEC   ciprofloxacin 500 MG tablet Commonly known as: CIPRO   furosemide 20 MG tablet Commonly known as: LASIX   glipiZIDE 10 MG tablet Commonly known as: GLUCOTROL   isosorbide mononitrate 30 MG 24 hr tablet Commonly known as: IMDUR   metFORMIN 500 MG 24 hr tablet Commonly known as: GLUCOPHAGE-XR   pantoprazole 40 MG tablet Commonly known as: PROTONIX   polyethylene  glycol 17 g packet Commonly known as: MIRALAX / GLYCOLAX   rifaximin 550 MG Tabs tablet Commonly known as: XIFAXAN   spironolactone 25 MG tablet Commonly known as: ALDACTONE       TAKE these medications    acetaminophen 325 MG tablet Commonly known as: TYLENOL Take 975 mg by mouth in the morning and at bedtime.   camphor-menthol lotion Commonly known as: SARNA Apply topically 2 (two) times daily.   cholestyramine light 4 g packet Commonly known as: PREVALITE Take 1 packet (4 g total) by mouth 2 times daily at 12 noon and 4 pm.   feeding supplement (GLUCERNA SHAKE) Liqd Take 237 mLs by mouth 2 (two) times daily between meals.   hydrOXYzine 25 MG tablet Commonly known as: ATARAX/VISTARIL Take 1 tablet (25 mg total) by mouth 3 (three) times daily as needed for itching or anxiety.   lactulose 10 GM/15ML solution Commonly known as: CHRONULAC Take 30 mLs (20 g total) by mouth 3 (three) times daily.   loratadine 10 MG tablet Commonly known as: CLARITIN Take 10 mg by mouth at bedtime.   meclizine 25 MG tablet Commonly known as: ANTIVERT Take 1 tablet (25 mg total) by mouth 3 (three) times daily.   pramoxine-hydrocortisone 1-1 % foam Apply 1 g topically 4 (four) times daily as needed (itching).   triamcinolone cream 0.1 % Commonly known as: KENALOG Apply 1 application topically 2 (two)  times daily as needed (itching).        Allergies  Allergen Reactions   Atorvastatin Other (See Comments)    Unknown   Metoprolol Other (See Comments)    bradycardia   Penicillins Swelling    Swelling at injection site Has patient had a PCN reaction causing immediate rash, facial/tongue/throat swelling, SOB or lightheadedness with hypotension: No Has patient had a PCN reaction causing severe rash involving mucus membranes or skin necrosis: No Has patient had a PCN reaction that required hospitalization No Has patient had a PCN reaction occurring within the last 10 years: No If  all of the above answers are "NO", then may proceed with Cephalosporin use.   Rosuvastatin Other (See Comments)    unknown   Simvastatin Other (See Comments)    unknown   Terazosin Other (See Comments)    Orthostatic hypotension    Consultations: GI, hospice   Procedures/Studies: CT ABDOMEN PELVIS WO CONTRAST  Result Date: 04/28/2021 CLINICAL DATA:  Abdominal abscess/infection suspected. Elevated lipase. EXAM: CT ABDOMEN AND PELVIS WITHOUT CONTRAST TECHNIQUE: Multidetector CT imaging of the abdomen and pelvis was performed following the standard protocol without IV contrast. COMPARISON:  04/02/2021 FINDINGS: Lower chest: Lung bases are unremarkable. Heart size is normal. There is dense atherosclerotic calcification the coronary arteries. Hepatobiliary: Moderate, increased ascites. Liver is nodular consistent with cirrhosis. No focal liver lesion. The gallbladder is contracted. Layering calcified gallstones are present. There is new air within the gallbladder lumen and nondilated common bile duct. Gallbladder wall appears thickened but is not fully assessed on CT. Pancreas: Unremarkable. No pancreatic ductal dilatation or surrounding inflammatory changes. Spleen: Enlarged homogeneous spleen. Adrenals/Urinary Tract: RIGHT adrenal gland is normal. LEFT adrenal gland is normal. Coarse calcifications along the UPPER pole of LEFT kidney, stable in appearance. RIGHT kidney is normal. No hydronephrosis. Ureters are unremarkable. Urinary bladder is distended. Stomach/Bowel: Stomach and small bowel loops are normal in appearance. There is mild diffuse thickening of the colonic wall. Average stool burden. No colonic mass. The appendix is not well seen. Vascular/Lymphatic: There is atherosclerotic calcification of the abdominal aorta not associated with aneurysms. No retroperitoneal or mesenteric adenopathy. Numerous venous collaterals identified in the LEFT UPPER QUADRANT and anterior mesentery consistent with  portal venous hypertension. Reproductive: Prostatic enlargement. Other: Moderate ascites.  Ascites extends to the RIGHT hemiscrotum. Musculoskeletal: Degenerative changes in the lumbar spine. No acute abnormality. IMPRESSION: 1. Increased ascites. 2. Cirrhotic morphology of the liver.  Portal venous hypertension. 3. Cholelithiasis.  Suspect gallbladder wall thickening. 4. There is interval development of air within the biliary tree and gallbladder lumen, suspicious for emphysematous cholecystitis. Consider further evaluation with MRCP. 5. RIGHT inguinal hernia with ascites extending into the RIGHT hemiscrotum. 6.  Aortic atherosclerosis.  (ICD10-I70.0) 7. Prostatic enlargement. Electronically Signed   By: Nolon Nations M.D.   On: 04/28/2021 15:26   MR BRAIN WO CONTRAST  Result Date: 04/06/2021 CLINICAL DATA:  Dizziness, persistent/recurrent, cardiac or vascular cause suspected. History of cirrhosis. EXAM: MRI HEAD WITHOUT CONTRAST TECHNIQUE: Multiplanar, multiecho pulse sequences of the brain and surrounding structures were obtained without intravenous contrast. COMPARISON:  None. FINDINGS: Brain: There is no evidence of an acute infarct, intracranial hemorrhage, mass, midline shift, or extra-axial fluid collection. Scattered small T2 hyperintensities in the cerebral white matter bilaterally are nonspecific but compatible with minimal chronic small vessel ischemic disease. There is symmetric intrinsic T1 hyperintensity in the globi pallidi. A subcentimeter chronic infarct is noted in the right cerebellar hemisphere. There is mild generalized  cerebral atrophy. Vascular: Major intracranial vascular flow voids are preserved. Skull and upper cervical spine: Unremarkable bone marrow signal. Sinuses/Orbits: Unremarkable orbits. Mild mucosal thickening in the paranasal sinuses. Trace right mastoid fluid. Other: None. IMPRESSION: 1. No acute intracranial abnormality. 2. Minimal chronic small vessel ischemic disease  in the cerebral white matter. 3. Tiny chronic right cerebellar infarct. 4. T1 hyperintensity in the globi pallidi consistent with chronic liver disease. Electronically Signed   By: Logan Bores M.D.   On: 04/06/2021 13:35   US RENAL  Result Date: 04/05/2021 CLINICAL DATA:  Acute kidney injury and history of prior partial left nephrectomy. EXAM: RENAL / URINARY TRACT ULTRASOUND COMPLETE COMPARISON:  None. FINDINGS: Right Kidney: Renal measurements: 11.8 x 5.3 x 5.5 cm = volume: 179 mL. Echogenicity within normal limits. No mass or hydronephrosis visualized. Left Kidney: Renal measurements: 9.0 x 4.9 x 4.4 cm = volume: 101 mL. Scarring at the level of the upper pole likely is related to the history of partial nephrectomy. No evidence of focal mass or hydronephrosis. Bladder: Appears normal for degree of bladder distention. Other: None. IMPRESSION: No evidence of hydronephrosis bilaterally. Slightly smaller left kidney with upper pole scarring, likely reflecting the given history of prior partial left nephrectomy. Electronically Signed   By: Aletta Edouard M.D.   On: 04/05/2021 16:51   US Paracentesis  Result Date: 04/29/2021 INDICATION: Patient with a history of cirrhosis and recurrent ascites presents today for a diagnostic and therapeutic paracentesis. EXAM: ULTRASOUND GUIDED PARACENTESIS MEDICATIONS: 1% lidocaine 10 mL COMPLICATIONS: None immediate. PROCEDURE: Informed written consent was obtained from the patient after a discussion of the risks, benefits and alternatives to treatment. A timeout was performed prior to the initiation of the procedure. Initial ultrasound scanning demonstrates a large amount of ascites within the left lower abdominal quadrant. The left lower abdomen was prepped and draped in the usual sterile fashion. 1% lidocaine was used for local anesthesia. Following this, a 19 gauge, 7-cm, Yueh catheter was introduced. An ultrasound image was saved for documentation purposes. The  paracentesis was performed. The catheter was removed and a dressing was applied. The patient tolerated the procedure well without immediate post procedural complication. FINDINGS: A total of approximately 3.2 L of yellow fluid was removed. Samples were sent to the laboratory as requested by the clinical team. IMPRESSION: Successful ultrasound-guided paracentesis yielding 3.2 liters of peritoneal fluid. Read by: Soyla Dryer, NP Electronically Signed   By: Albin Felling M.D.   On: 04/29/2021 11:56   US Paracentesis  Result Date: 04/26/2021 INDICATION: Abdominal distension.  Cirrhosis. EXAM: ULTRASOUND GUIDED  PARACENTESIS MEDICATIONS: None. COMPLICATIONS: None immediate. PROCEDURE: Informed written consent was obtained from the patient after a discussion of the risks, benefits and alternatives to treatment. A timeout was performed prior to the initiation of the procedure. Initial ultrasound scanning demonstrates a large amount of ascites within the left lower abdominal quadrant. The left lower abdomen was prepped and draped in the usual sterile fashion. 1% lidocaine was used for local anesthesia. Following this, a Yueh catheter was introduced. An ultrasound image was saved for documentation purposes. The paracentesis was performed. The catheter was removed and a dressing was applied. The patient tolerated the procedure well without immediate post procedural complication. FINDINGS: A total of approximately 3 L of yellow fluid was removed. Samples were sent to the laboratory as requested by the clinical team. IMPRESSION: Successful ultrasound-guided paracentesis yielding 3 liters of peritoneal fluid. Electronically Signed   By: Scherrie Gerlach.D.  On: 04/26/2021 15:01   DG ERCP  Result Date: 04/03/2021 CLINICAL DATA:  70 year old male with ERCP EXAM: ERCP TECHNIQUE: Multiple spot images obtained with the fluoroscopic device and submitted for interpretation post-procedure. FLUOROSCOPY TIME:  Fluoroscopy Time:   1 minute 35 seconds COMPARISON:  MR 04/02/2021 FINDINGS: Limited intraoperative fluoroscopic spot images during ERCP. Initial image demonstrates the endoscope projecting over the upper abdomen with safety wire within the extrahepatic biliary ducts. There is subsequently retrograde infusion of contrast with partial opacification. Passage of a balloon retrieval catheter. IMPRESSION: Limited images during ERCP demonstrates deployment of balloon retrieval catheter for treatment of choledocholithiasis. Please refer to the dictated operative report for full details of intraoperative findings and procedure. Electronically Signed   By: Corrie Mckusick D.O.   On: 04/03/2021 16:44   MR ABDOMEN MRCP W WO CONTAST  Result Date: 04/29/2021 CLINICAL DATA:  70 year old male with history of cholelithiasis on recent CT examination. Evaluate for potential choledocholithiasis. EXAM: MRI ABDOMEN WITHOUT AND WITH CONTRAST (INCLUDING MRCP) TECHNIQUE: Multiplanar multisequence MR imaging of the abdomen was performed both before and after the administration of intravenous contrast. Heavily T2-weighted images of the biliary and pancreatic ducts were obtained, and three-dimensional MRCP images were rendered by post processing. CONTRAST:  4m GADAVIST GADOBUTROL 1 MMOL/ML IV SOLN COMPARISON:  Abdominal MRI 04/02/2021. CT the abdomen and pelvis 04/28/2021. FINDINGS: Comment: Today's study is limited by extensive artifact from patient motion, rendering the MRCP images nondiagnostic. Lower chest: Mild elevation of the right hemidiaphragm. Otherwise, unremarkable. Hepatobiliary: Liver has a shrunken appearance and nodular contour, indicative of advanced cirrhosis. Tiny foci of hyperenhancement are again noted in the right lobe of the liver measuring 7 mm (axial image 14 of series 18) and 5 mm (axial image 18 of series 18), stable compared to the prior examination. No other new suspicious appearing cystic or solid hepatic lesions. No  intrahepatic biliary ductal dilatation confidently identified (MRCP images are essentially nondiagnostic). Common bile duct is dilated measuring up to 11 mm in the porta hepatis. There is a suggestion of some debris and/or stones within the common bile duct, however, this is difficult to confirm on today's motion limited examination. Small filling defects are noted within the gallbladder, compatible with cholelithiasis. Gallbladder is decompressed. Small amount of non dependent signal void within the gallbladder is compatible with pneumobilia, better demonstrated on preceding CT examination. Pancreas: No pancreatic mass. No pancreatic ductal dilatation. No peripancreatic fluid collections. Spleen: Spleen is enlarged measuring 16.4 x 6.6 x 14.8 cm (estimated splenic volume of 801 mL) . Adrenals/Urinary Tract: Subcentimeter T1 hypointense, T2 hyperintense, nonenhancing lesions in the right kidney are compatible with tiny simple cysts. Left kidney and bilateral adrenal glands are normal in appearance. No hydroureteronephrosis in the visualized portions of the abdomen Stomach/Bowel: Visualized portions are unremarkable. Vascular/Lymphatic: No aneurysm identified in the visualized abdominal vasculature. 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 Right portal vein appears chronically occluded. Several prominent borderline enlarged and mildly enlarged lymph nodes are noted adjacent to the liver measuring up to 1.4 cm in short axis in the hepatoduodenal ligament nodal station. Other:  Large volume of ascites. Musculoskeletal: No aggressive appearing osseous lesions are noted in the visualized portions of the skeleton. IMPRESSION: 1. Limited examination secondary to respiratory motion. Common bile duct is dilated up to 11 mm in the porta hepatis. There is a  suggestion of debris and/or small gallstones within the common bile duct such that choledocholithiasis is not excluded. However, the lack of intrahepatic biliary ductal dilatation suggests against frank obstruction. 2. Cholelithiasis without definitive evidence to suggest an acute cholecystitis at this time. Small amount of pneumobilia in the gallbladder related to prior sphincterotomy performed on 04/03/2021. 3. Advanced morphologic changes of cirrhosis redemonstrated with small hypervascular lesions in the right lobe of the liver measuring 7 mm or less in size, stable compared to the prior study, once again categorized as LI-RADS 3. Repeat abdominal MRI with and without IV gadolinium is recommended in 6 months to ensure stability. 4. 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 5. Large volume of ascites. Electronically Signed   By: Vinnie Langton M.D.   On: 04/29/2021 06:18   IR Paracentesis  Result Date: 04/11/2021 INDICATION: Patient with history of cirrhosis, portal vein thrombosis, recurrent ascites. Request to IR for diagnostic and therapeutic paracentesis EXAM: ULTRASOUND GUIDED DIAGNOSTIC AND THERAPEUTIC PARACENTESIS MEDICATIONS: 8 mL 1% lidocaine COMPLICATIONS: None immediate. PROCEDURE: Informed written consent was obtained from the patient after a discussion of the risks, benefits and alternatives to treatment. A timeout was performed prior to the initiation of the procedure. Initial ultrasound scanning demonstrates a small amount of ascites within the left lower abdominal quadrant. The left lower abdomen was prepped and draped in the usual sterile fashion. 1% lidocaine was used for local anesthesia. Following this, a 6 Fr Safe-T-Centesis catheter was introduced. An ultrasound image was saved for documentation purposes. The paracentesis was performed. The  catheter was removed and a dressing was applied. The patient tolerated the procedure well without immediate post procedural complication. FINDINGS: A total of approximately 1.2L of clear, bright yellow fluid was removed. Samples were sent to the laboratory as requested by the clinical team. IMPRESSION: Successful ultrasound-guided paracentesis yielding 1.2 liters of peritoneal fluid. Read by Candiss Norse, PA-C Electronically Signed   By: Miachel Roux M.D.   On: 04/11/2021 15:17      Subjective:   Discharge Exam: Vitals:   05/02/21 1947 05/03/21 0839  BP: (!) 87/44 (!) 109/56  Pulse: 69 65  Resp:  10  Temp: 98.1 F (36.7 C) 97.7 F (36.5 C)  SpO2: 100% 100%   Vitals:   05/02/21 0328 05/02/21 0825 05/02/21 1947 05/03/21 0839  BP: 105/61 100/63 (!) 87/44 (!) 109/56  Pulse: (!) 56 (!) 58 69 65  Resp: 20 18  10   Temp: (!) 97.5 F (36.4 C) 97.6 F (36.4 C) 98.1 F (36.7 C) 97.7 F (36.5 C)  TempSrc: Oral Oral Oral Oral  SpO2: 100% 100% 100% 100%  Weight:      Height:        General: Pt is alert, awake, jaundice Cardiovascular: RRR, S1/S2 +, no rubs, no gallops Respiratory: CTA bilaterally, no wheezing, no rhonchi Abdominal: Soft, very distended, fluid shift, decreased bowel sounds Extremities: Positive edema    The results of significant diagnostics from this hospitalization (including imaging, microbiology, ancillary and laboratory) are listed below for reference.     Microbiology: Recent Results (from the past 240 hour(s))  Resp Panel by RT-PCR (Flu A&B, Covid) Nasopharyngeal Swab     Status: None   Collection Time: 04/28/21  7:26 PM   Specimen: Nasopharyngeal Swab; Nasopharyngeal(NP) swabs in vial transport medium  Result Value Ref Range Status   SARS Coronavirus  2 by RT PCR NEGATIVE NEGATIVE Final    Comment: (NOTE) SARS-CoV-2 target nucleic acids are NOT DETECTED.  The SARS-CoV-2 RNA is generally detectable in upper respiratory specimens during the acute  phase of infection. The lowest concentration of SARS-CoV-2 viral copies this assay can detect is 138 copies/mL. A negative result does not preclude SARS-Cov-2 infection and should not be used as the sole basis for treatment or other patient management decisions. A negative result may occur with  improper specimen collection/handling, submission of specimen other than nasopharyngeal swab, presence of viral mutation(s) within the areas targeted by this assay, and inadequate number of viral copies(<138 copies/mL). A negative result must be combined with clinical observations, patient history, and epidemiological information. The expected result is Negative.  Fact Sheet for Patients:  EntrepreneurPulse.com.au  Fact Sheet for Healthcare Providers:  IncredibleEmployment.be  This test is no t yet approved or cleared by the [place] FDA and  has been authorized for detection and/or diagnosis of SARS-CoV-2 by FDA under an Emergency Use Authorization (EUA). This EUA will remain  in effect (meaning this test can be used) for the duration of the COVID-19 declaration under Section 564(b)(1) of the Act, 21 U.S.C.section 360bbb-3(b)(1), unless the authorization is terminated  or revoked sooner.       Influenza A by PCR NEGATIVE NEGATIVE Final   Influenza B by PCR NEGATIVE NEGATIVE Final    Comment: (NOTE) The Xpert Xpress SARS-CoV-2/FLU/RSV plus assay is intended as an aid in the diagnosis of influenza from Nasopharyngeal swab specimens and should not be used as a sole basis for treatment. Nasal washings and aspirates are unacceptable for Xpert Xpress SARS-CoV-2/FLU/RSV testing.  Fact Sheet for Patients: EntrepreneurPulse.com.au  Fact Sheet for Healthcare Providers: IncredibleEmployment.be  This test is not yet approved or cleared by the [place] FDA and has been authorized for detection and/or diagnosis of  SARS-CoV-2 by FDA under an Emergency Use Authorization (EUA). This EUA will remain in effect (meaning this test can be used) for the duration of the COVID-19 declaration under Section 564(b)(1) of the Act, 21 U.S.C. section 360bbb-3(b)(1), unless the authorization is terminated or revoked.  Performed at [facility], [address] [place], [place] [address]   MRSA Next Gen by PCR, Nasal     Status: None   Collection Time: 04/28/21 11:05 PM   Specimen: Nasal Mucosa; Nasal Swab  Result Value Ref Range Status   MRSA by PCR Next Gen NOT DETECTED NOT DETECTED Final    Comment: (NOTE) The GeneXpert MRSA Assay (FDA approved for NASAL specimens only), is one component of a comprehensive MRSA colonization surveillance program. It is not intended to diagnose MRSA infection nor to guide or monitor treatment for MRSA infections. Test performance is not FDA approved in patients less than 63 years old. Performed at [facility], [address], [place], [place] [address]   Body fluid culture w Gram Stain     Status: None   Collection Time: 04/29/21 11:28 AM   Specimen: PATH Cytology Peritoneal fluid  Result Value Ref Range Status   Specimen Description   Final    PERITONEAL Performed at [facility], [address], [place], [place] [address]    Special Requests   Final    NONE Performed at [facility], [address] [place], [place] [address]    Gram Stain   Final    WBC PRESENT, PREDOMINANTLY MONONUCLEAR NO ORGANISMS SEEN CYTOSPIN SMEAR    Culture  Final    NO GROWTH 3 DAYS Performed at [facility], [address], [place], [place] [address]    Report Status 05/02/2021 FINAL  Final     Labs: BNP (last 3 results) No results for input(s): BNP in the last 8760 hours. Basic Metabolic Panel: Recent Labs  Lab 04/28/21 1156 04/29/21 0613  NA 138 142  K 4.1 4.1  CL 110 117*  CO2 15* 16*  GLUCOSE 85 74  BUN 92* 87*   CREATININE 2.23* 2.25*  CALCIUM 8.4* 8.7*   Liver Function Tests: Recent Labs  Lab 04/28/21 1156 04/29/21 0613  AST 288* 278*  ALT 153* 151*  ALKPHOS 238* 205*  BILITOT 13.3* 12.4*  PROT 6.3* 6.0*  ALBUMIN 2.0* 1.9*   Recent Labs  Lab 04/28/21 1156  LIPASE 130*   Recent Labs  Lab 04/28/21 2205  AMMONIA 100*   CBC: Recent Labs  Lab 04/28/21 1156 04/29/21 0613  WBC 8.4 8.6  HGB 10.5* 10.3*  HCT 29.7* 29.6*  MCV 99.7 98.7  PLT 193 203   Cardiac Enzymes: No results for input(s): CKTOTAL, CKMB, CKMBINDEX, TROPONINI in the last 168 hours. BNP: Invalid input(s): POCBNP CBG: Recent Labs  Lab 05/01/21 0821 05/01/21 1130 05/01/21 1633 05/01/21 2103 05/02/21 0810  GLUCAP 129* 256* 178* 218* 158*   D-Dimer No results for input(s): DDIMER in the last 72 hours. Hgb A1c No results for input(s): HGBA1C in the last 72 hours. Lipid Profile No results for input(s): CHOL, HDL, LDLCALC, TRIG, CHOLHDL, LDLDIRECT in the last 72 hours. Thyroid function studies No results for input(s): TSH, T4TOTAL, T3FREE, THYROIDAB in the last 72 hours.  Invalid input(s): FREET3 Anemia work up No results for input(s): VITAMINB12, FOLATE, FERRITIN, TIBC, IRON, RETICCTPCT in the last 72 hours. Urinalysis    Component Value Date/Time   COLORURINE AMBER (A) 04/28/2021 1200   APPEARANCEUR HAZY (A) 04/28/2021 1200   LABSPEC 1.011 04/28/2021 1200   PHURINE 5.0 04/28/2021 1200   GLUCOSEU NEGATIVE 04/28/2021 1200   HGBUR NEGATIVE 04/28/2021 1200   BILIRUBINUR NEGATIVE 04/28/2021 1200   KETONESUR NEGATIVE 04/28/2021 1200   PROTEINUR NEGATIVE 04/28/2021 1200   NITRITE NEGATIVE 04/28/2021 1200   LEUKOCYTESUR NEGATIVE 04/28/2021 1200   Sepsis Labs Invalid input(s): PROCALCITONIN,  WBC,  LACTICIDVEN Microbiology Recent Results (from the past 240 hour(s))  Resp Panel by RT-PCR (Flu A&B, Covid) Nasopharyngeal Swab     Status: None   Collection Time: 04/28/21  7:26 PM   Specimen:  Nasopharyngeal Swab; Nasopharyngeal(NP) swabs in vial transport medium  Result Value Ref Range Status   SARS Coronavirus 2 by RT PCR NEGATIVE NEGATIVE Final    Comment: (NOTE) SARS-CoV-2 target nucleic acids are NOT DETECTED.  The SARS-CoV-2 RNA is generally detectable in upper respiratory specimens during the acute phase of infection. The lowest concentration of SARS-CoV-2 viral copies this assay can detect is 138 copies/mL. A negative result does not preclude SARS-Cov-2 infection and should not be used as the sole basis for treatment or other patient management decisions. A negative result may occur with  improper specimen collection/handling, submission of specimen other than nasopharyngeal swab, presence of viral mutation(s) within the areas targeted by this assay, and inadequate number of viral copies(<138 copies/mL). A negative result must be combined with clinical observations, patient history, and epidemiological information. The expected result is Negative.  Fact Sheet for Patients:  EntrepreneurPulse.com.au  Fact Sheet for Healthcare Providers:  IncredibleEmployment.be  This test is no t yet approved or cleared by  the [employer] and  has been authorized for detection and/or diagnosis of SARS-CoV-2 by FDA under an Emergency Use Authorization (EUA). This EUA will remain  in effect (meaning this test can be used) for the duration of the COVID-19 declaration under Section 564(b)(1) of the Act, 21 U.S.C.section 360bbb-3(b)(1), unless the authorization is terminated  or revoked sooner.       Influenza A by PCR NEGATIVE NEGATIVE Final   Influenza B by PCR NEGATIVE NEGATIVE Final    Comment: (NOTE) The Xpert Xpress SARS-CoV-2/FLU/RSV plus assay is intended as an aid in the diagnosis of influenza from Nasopharyngeal swab specimens and should not be used as a sole basis for treatment. Nasal washings and aspirates are unacceptable for  Xpert Xpress SARS-CoV-2/FLU/RSV testing.  Fact Sheet for Patients: EntrepreneurPulse.com.au  Fact Sheet for Healthcare Providers: IncredibleEmployment.be  This test is not yet approved or cleared by the [place] FDA and has been authorized for detection and/or diagnosis of SARS-CoV-2 by FDA under an Emergency Use Authorization (EUA). This EUA will remain in effect (meaning this test can be used) for the duration of the COVID-19 declaration under Section 564(b)(1) of the Act, 21 U.S.C. section 360bbb-3(b)(1), unless the authorization is terminated or revoked.  Performed at [facility], [address] [place], [place] [address]   MRSA Next Gen by PCR, Nasal     Status: None   Collection Time: 04/28/21 11:05 PM   Specimen: Nasal Mucosa; Nasal Swab  Result Value Ref Range Status   MRSA by PCR Next Gen NOT DETECTED NOT DETECTED Final    Comment: (NOTE) The GeneXpert MRSA Assay (FDA approved for NASAL specimens only), is one component of a comprehensive MRSA colonization surveillance program. It is not intended to diagnose MRSA infection nor to guide or monitor treatment for MRSA infections. Test performance is not FDA approved in patients less than 65 years old. Performed at [facility], [address] [place], [place] [address]   Body fluid culture w Gram Stain     Status: None   Collection Time: 04/29/21 11:28 AM   Specimen: PATH Cytology Peritoneal fluid  Result Value Ref Range Status   Specimen Description   Final    PERITONEAL Performed at [facility], [address] [place], [place] [address]    Special Requests   Final    NONE Performed at [facility], [address] [place], [place] [address]    Gram Stain   Final    WBC PRESENT, PREDOMINANTLY MONONUCLEAR NO ORGANISMS SEEN CYTOSPIN SMEAR    Culture   Final    NO GROWTH 3 DAYS Performed at [facility], [place] [address], [place], [place] [address]    Report Status 05/02/2021 FINAL  Final     Time coordinating discharge: Over 30 minutes  SIGNED:   Nolberto Hanlon, MD  [facility] 05/03/2021, 11:37 AM Pager   If 7PM-7AM, please contact night-coverage [email or web address] Password [email or web address]

## 2021-06-06 DEATH — deceased

## 2021-12-09 IMAGING — MR MR ABDOMEN WO/W CM MRCP
19 of 20 series · 44 of 48 positions shown · IV contrast (8ml Gadavist)
Comparison: Abdominal MRI 04/02/2021. CT the abdomen and pelvis
04/28/2021.

CLINICAL DATA: 70-year-old male with history of cholelithiasis on
recent CT examination. Evaluate for potential choledocholithiasis.

EXAM:
MRI ABDOMEN WITHOUT AND WITH CONTRAST (INCLUDING MRCP)
TECHNIQUE: Multiplanar multisequence MR imaging of the abdomen was performed
both before and after the administration of intravenous contrast.
Heavily T2-weighted images of the biliary and pancreatic ducts were
obtained, and three-dimensional MRCP images were rendered by post
processing.
CONTRAST:  8mL GADAVIST GADOBUTROL 1 MMOL/ML IV SOLN

[Series 3: T2 · coronal · 7.0mm · 1.19mm/px · 1 of 34 slices shown (1 of 2)]
[im 1/34]
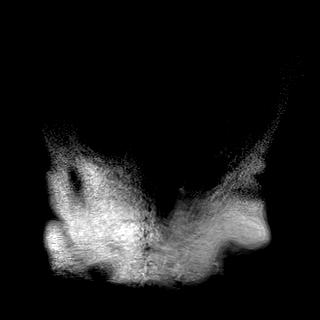

[Series 4: T2 · axial · 7.0mm · 1.19mm/px · 1 of 36 slices shown (2 of 2)]
[im 1/36]
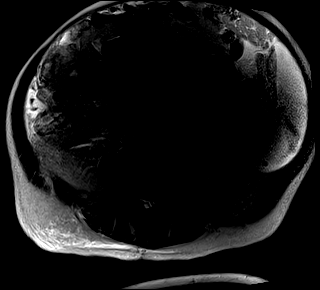

[Series 5: T1 · axial · 3.3mm · 1.19mm/px · z∈[-105,+208]mm · 3 of 96 slices shown (1 of 2)]
[im 1/96]
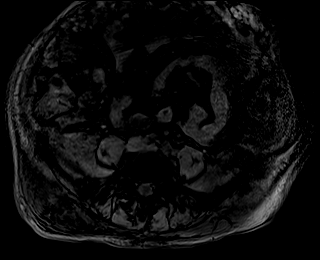
[im 48/96]
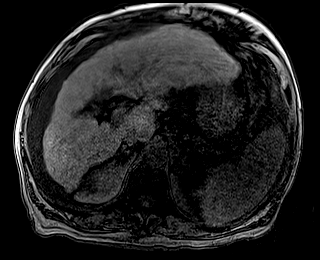
[im 96/96]
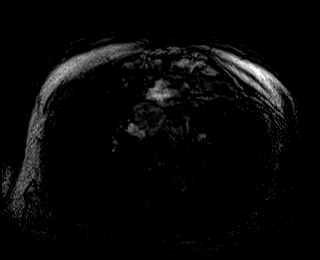

[Series 6: T1 · axial · 3.3mm · 1.19mm/px · z∈[-105,+208]mm · 4 of 96 slices shown (2 of 2)]
[im 1/96]
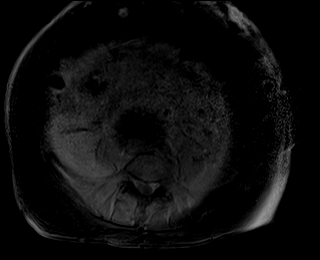
[im 32/96]
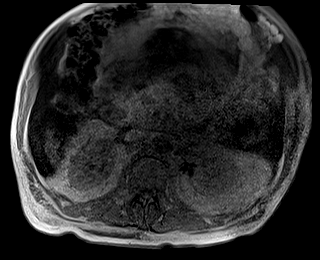
[im 64/96]
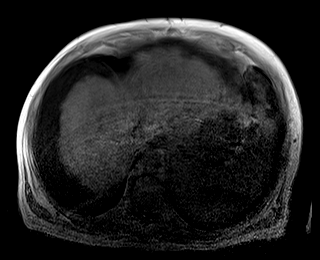
[im 96/96]
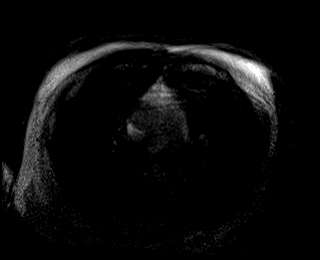

[Series 9: T2 fat-sat · axial · 7.0mm · 1.19mm/px · 1 of 36 slices shown]
[im 1/36]
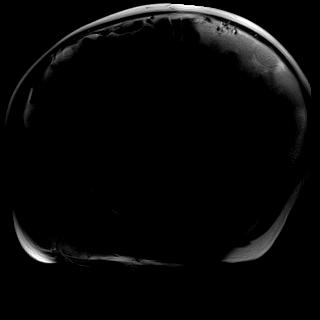

[Series 10: ax dwi_tracew · axial · 7.0mm · 1.42mm/px · 1 of 36 slices shown]
[im 1/36]
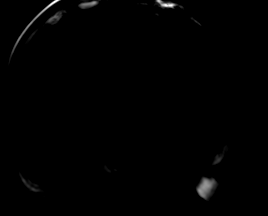

[Series 11: ax dwi_adc · axial · 7.0mm · 1.42mm/px · 1 of 36 slices shown]
[im 1/36]
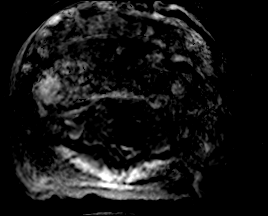

[Series 15: MRCP · coronal · 3.0mm · 1.12mm/px · 1 of 17 slices shown]
[im 1/17]
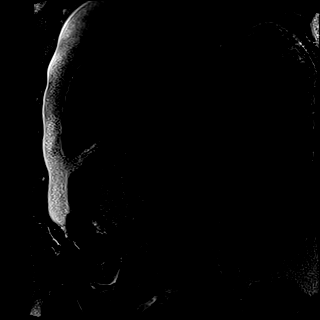

[Series 16: radials · coronal · 50.0mm · 0.78mm/px · 1 of 5 slices shown]
[im 1/5]
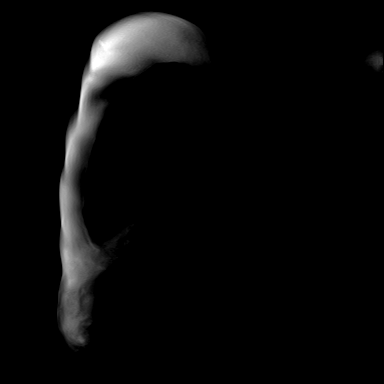

[Series 17: T1 dynamic fat-sat · axial · non-contrast · 3.5mm · 1.19mm/px · z∈[-96,+152]mm · 3 of 72 slices shown (1 of 5)]
[im 1/72]
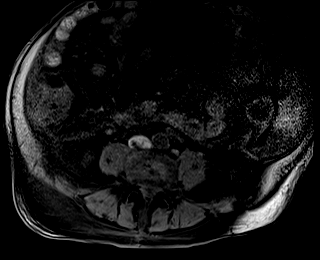
[im 36/72]
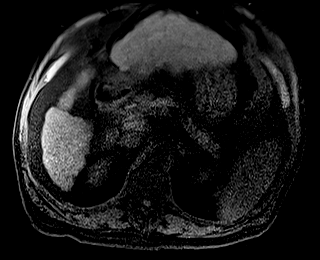
[im 72/72]
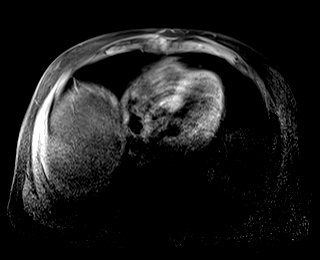

[Series 18: T1 dynamic fat-sat post-contrast · axial · 3.5mm · 1.19mm/px · z∈[-96,+152]mm · 3 of 72 slices shown (1 of 4)]
[im 1/72]
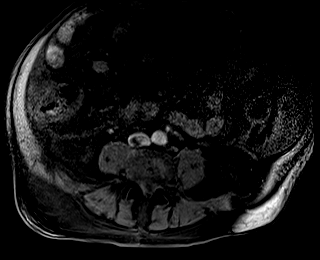
[im 36/72]
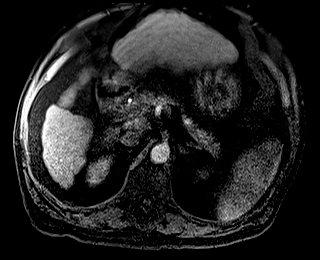
[im 72/72]
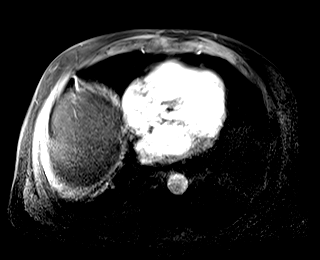

[Series 19: T1 dynamic fat-sat · axial · 3.5mm · 1.19mm/px · z∈[-96,+152]mm · 3 of 72 slices shown (2 of 5)]
[im 1/72]
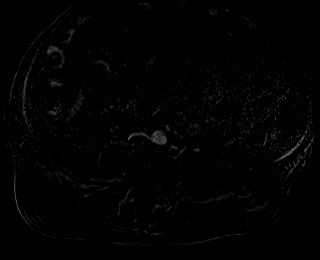
[im 36/72]
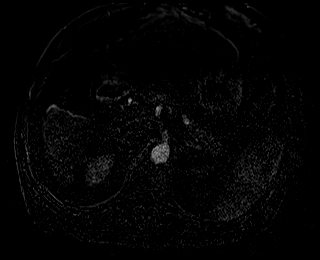
[im 72/72]
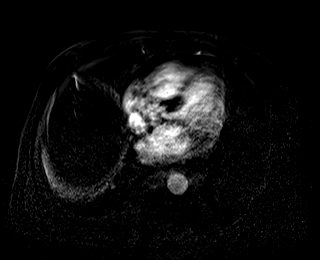

[Series 20: T1 dynamic fat-sat post-contrast · axial · 3.5mm · 1.19mm/px · z∈[-96,+152]mm · 3 of 72 slices shown (2 of 4)]
[im 1/72]
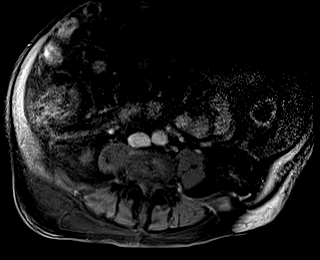
[im 36/72]
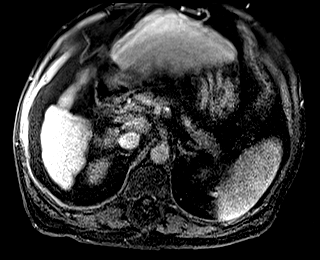
[im 72/72]
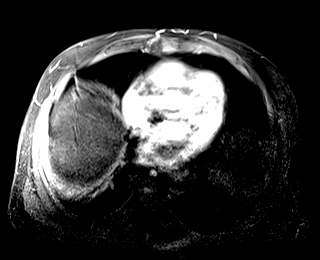

[Series 21: T1 dynamic fat-sat · axial · 3.5mm · 1.19mm/px · z∈[-96,+152]mm · 3 of 72 slices shown (3 of 5)]
[im 1/72]
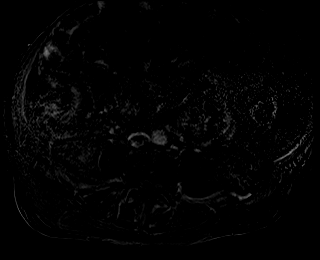
[im 36/72]
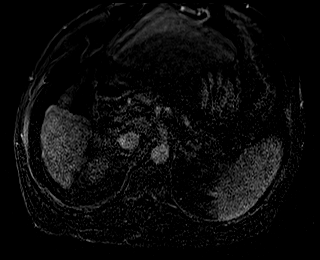
[im 72/72]
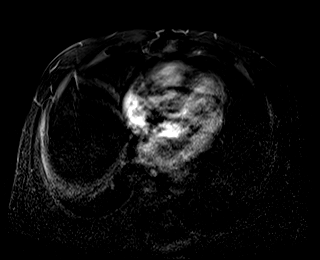

[Series 22: T1 dynamic fat-sat post-contrast · axial · 3.5mm · 1.19mm/px · z∈[-96,+152]mm · 3 of 72 slices shown (3 of 4)]
[im 1/72]
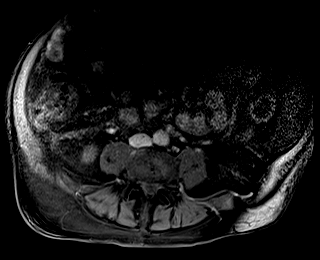
[im 36/72]
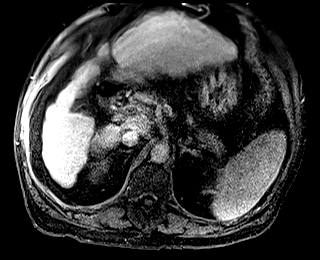
[im 72/72]
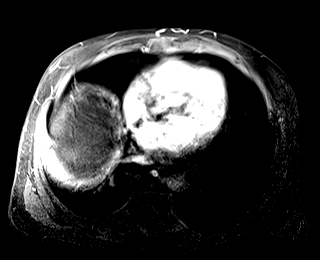

[Series 23: T1 dynamic fat-sat · axial · 3.5mm · 1.19mm/px · z∈[-96,+152]mm · 3 of 72 slices shown (4 of 5)]
[im 1/72]
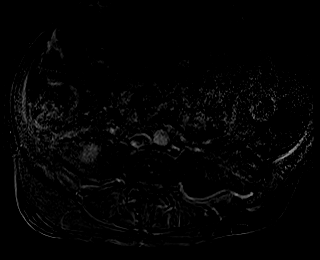
[im 36/72]
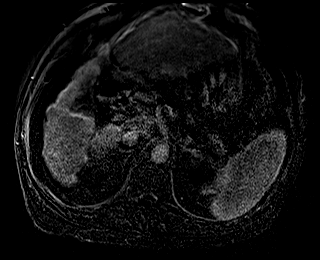
[im 72/72]
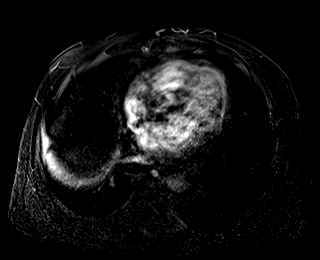

[Series 24: T1 dynamic post-contrast · coronal · 3.5mm · 1.19mm/px · 3 of 64 slices shown]
[im 1/64]
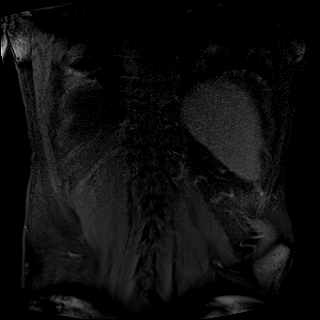
[im 32/64]
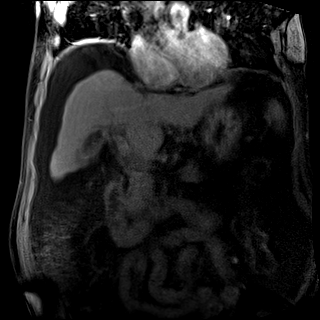
[im 64/64]
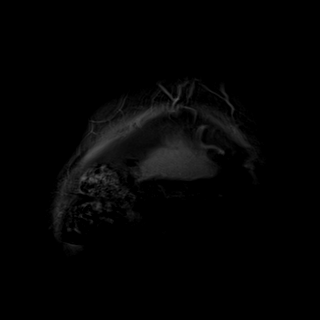

[Series 25: T1 dynamic fat-sat post-contrast · axial · 3.5mm · 1.19mm/px · z∈[-96,+152]mm · 3 of 72 slices shown (4 of 4)]
[im 1/72]
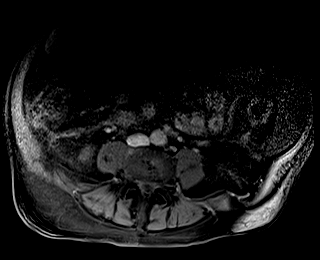
[im 36/72]
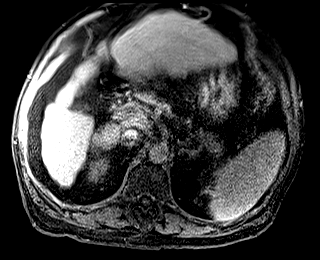
[im 72/72]
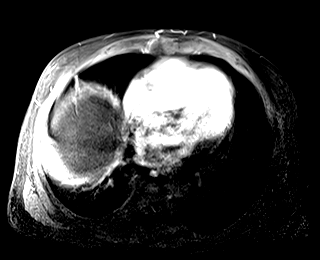

[Series 26: T1 dynamic fat-sat · axial · 3.5mm · 1.19mm/px · z∈[-96,+152]mm · 3 of 72 slices shown (5 of 5)]
[im 1/72]
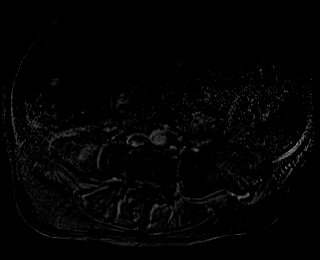
[im 36/72]
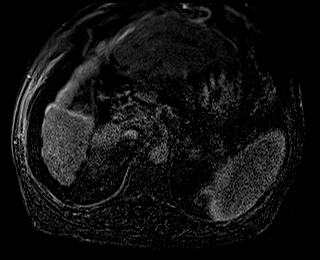
[im 72/72]
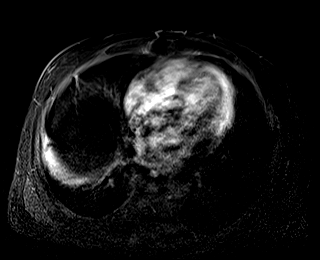

[44 of 48 positions shown; findings below may reference images not displayed]

FINDINGS: Comment: Today's study is limited by extensive artifact from patient
motion, rendering the MRCP images nondiagnostic.

Lower chest: Mild elevation of the right hemidiaphragm. Otherwise,
unremarkable.

Hepatobiliary: Liver has a shrunken appearance and nodular contour,
indicative of advanced cirrhosis. Tiny foci of hyperenhancement are
again noted in the right lobe of the liver measuring 7 mm (axial
image 14 of series 18) and 5 mm (axial image 18 of series 18),
stable compared to the prior examination. No other new suspicious
appearing cystic or solid hepatic lesions. No intrahepatic biliary
ductal dilatation confidently identified (MRCP images are
essentially nondiagnostic). Common bile duct is dilated measuring up
to 11 mm in the porta hepatis. There is a suggestion of some debris
and/or stones within the common bile duct, however, this is
difficult to confirm on today's motion limited examination. Small
filling defects are noted within the gallbladder, compatible with
cholelithiasis. Gallbladder is decompressed. Small amount of non
dependent signal void within the gallbladder is compatible with
pneumobilia, better demonstrated on preceding CT examination.

Pancreas: No pancreatic mass. No pancreatic ductal dilatation. No
peripancreatic fluid collections.

Spleen: Spleen is enlarged measuring 16.4 x 6.6 x 14.8 cm (estimated
splenic volume of 801 mL) .

Adrenals/Urinary Tract: Subcentimeter T1 hypointense, T2
hyperintense, nonenhancing lesions in the right kidney are
compatible with tiny simple cysts. Left kidney and bilateral adrenal
glands are normal in appearance. No hydroureteronephrosis in the
visualized portions of the abdomen

Stomach/Bowel: Visualized portions are unremarkable.

Vascular/Lymphatic: No aneurysm identified in the visualized
abdominal vasculature. Chronic thrombosis of the portal vein is
again noted, with reconstitution of flow in the left portal vein
branch the collateral pathways, as there is continued cavernous
transformation in the porta hepatis. Right portal vein appears
chronically occluded. Several prominent borderline enlarged and
mildly enlarged lymph nodes are noted adjacent to the liver
measuring up to 1.4 cm in short axis in the hepatoduodenal ligament
nodal station.

Other:  Large volume of ascites.

Musculoskeletal: No aggressive appearing osseous lesions are noted
in the visualized portions of the skeleton.
IMPRESSION: 1. Limited examination secondary to respiratory motion. Common bile
duct is dilated up to 11 mm in the porta hepatis. There is a
suggestion of debris and/or small gallstones within the common bile
duct such that choledocholithiasis is not excluded. However, the
lack of intrahepatic biliary ductal dilatation suggests against
frank obstruction.
2. Cholelithiasis without definitive evidence to suggest an acute
cholecystitis at this time. Small amount of pneumobilia in the
gallbladder related to prior sphincterotomy performed on 04/03/2021.
[DATE]. Advanced morphologic changes of cirrhosis redemonstrated with
small hypervascular lesions in the right lobe of the liver measuring
7 mm or less in size, stable compared to the prior study, once again
categorized as ALOBAIDI 3. Repeat abdominal MRI with and without IV
gadolinium is recommended in 6 months to ensure stability.
4. Chronic thrombosis of the portal vein and right portal venous
branch with cavernous transformation in the porta hepatis and
reconstitution of flow in the left portal vein, as above.
5. Large volume of ascites.

## 2021-12-10 IMAGING — US US PARACENTESIS
1 series · 9 of 9 positions shown · non-contrast
Comparison: none

INDICATION: Patient with a history of cirrhosis and recurrent ascites presents
today for a diagnostic and therapeutic paracentesis.

[Series 1: us paracentesis · 9 of 9 slices shown]
[im 1/9]
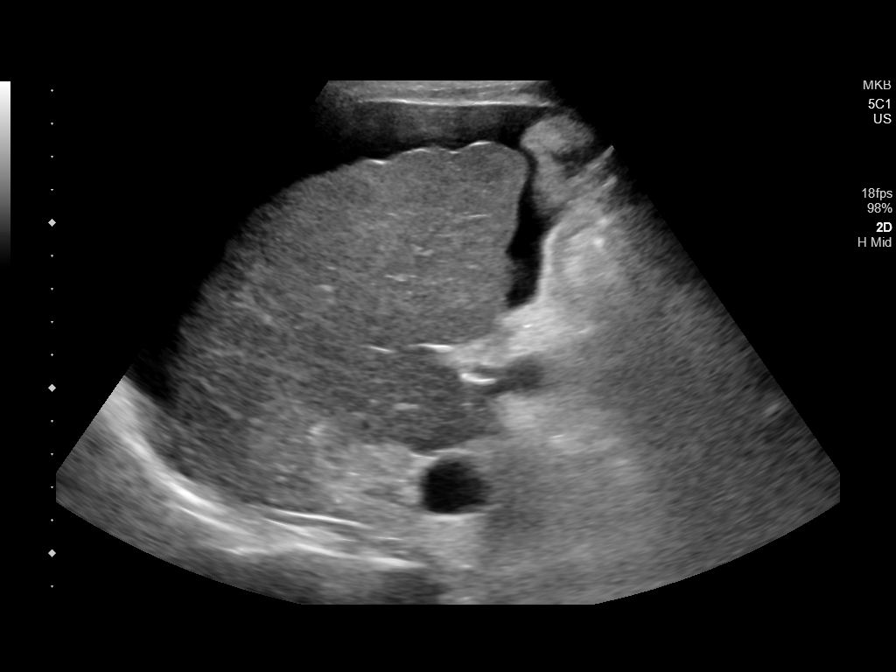
[im 2/9]
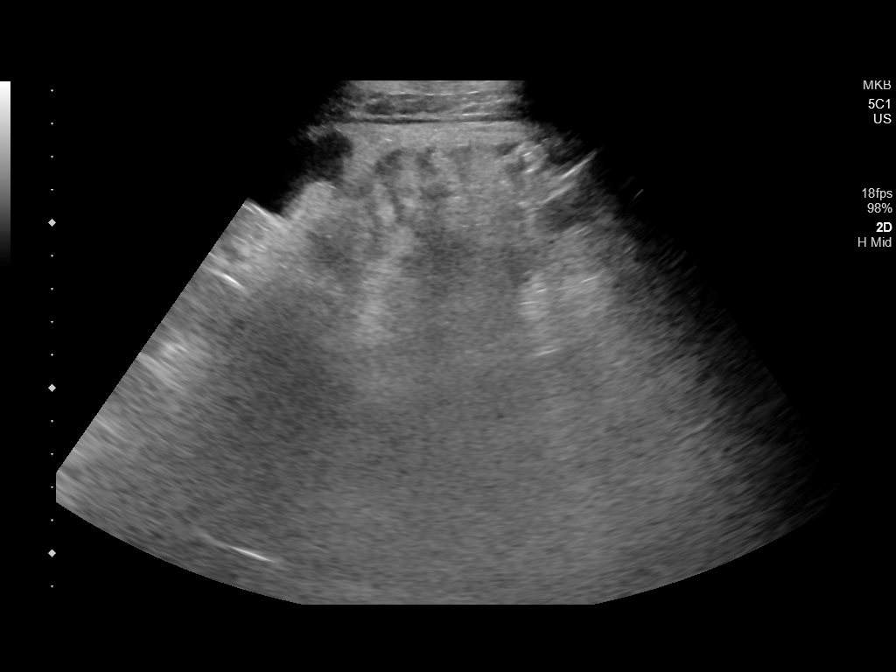
[im 3/9]
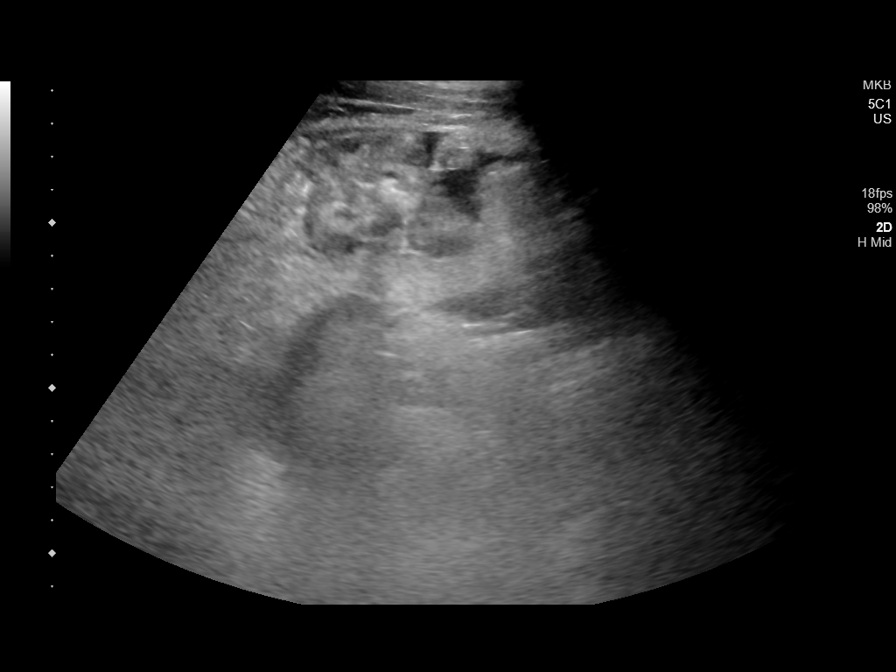
[im 4/9]
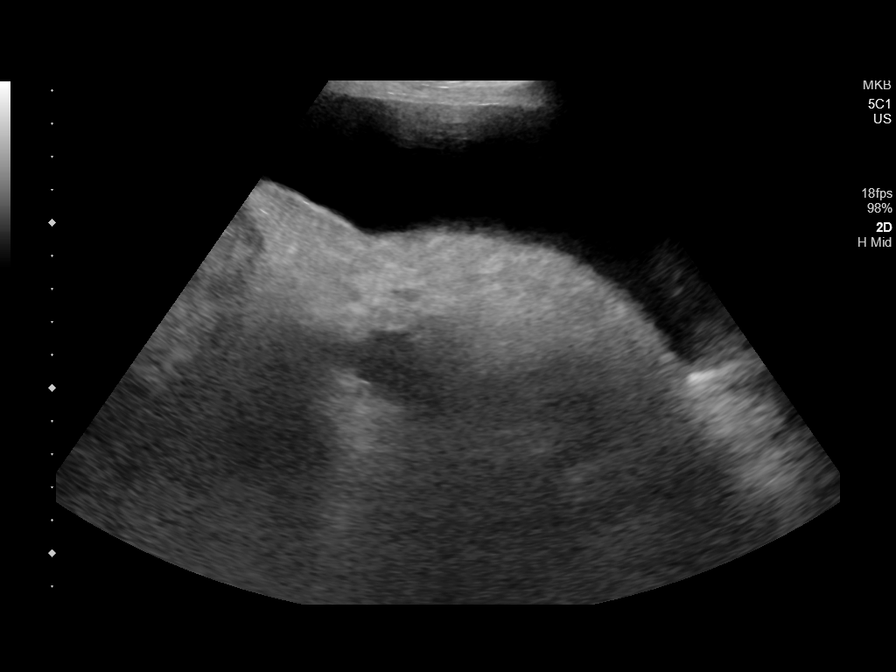
[im 5/9]
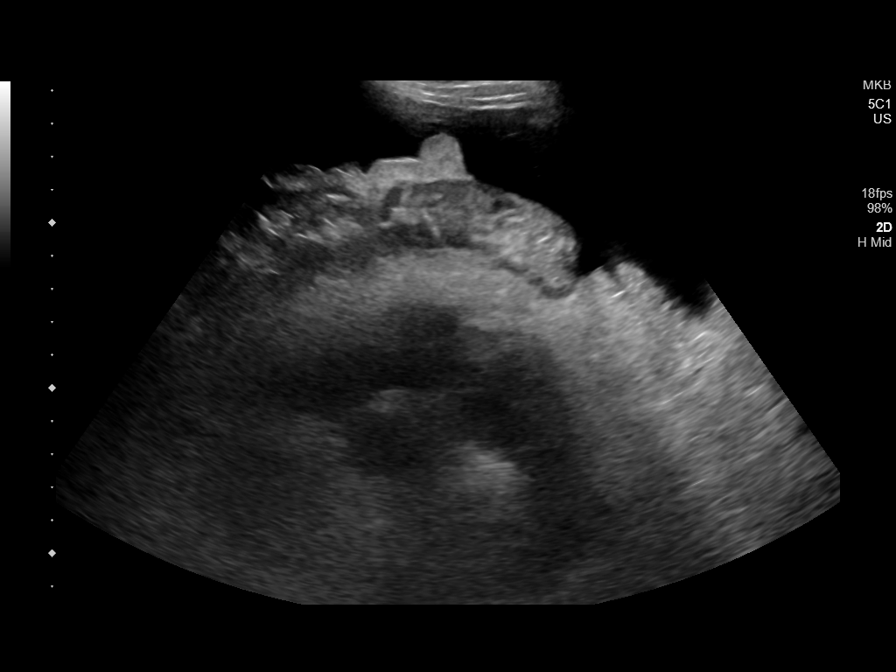
[im 6/9]
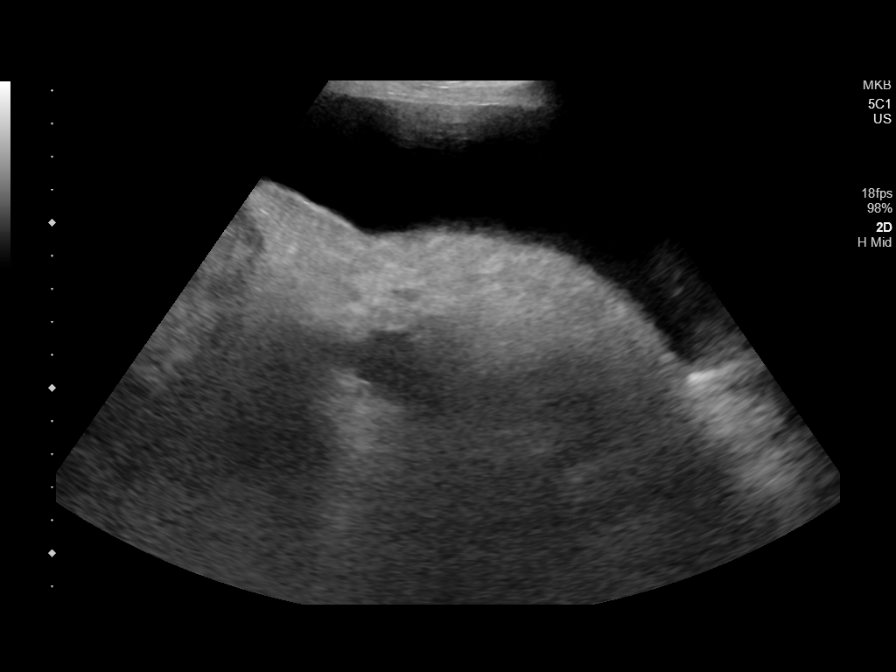
[im 7/9]
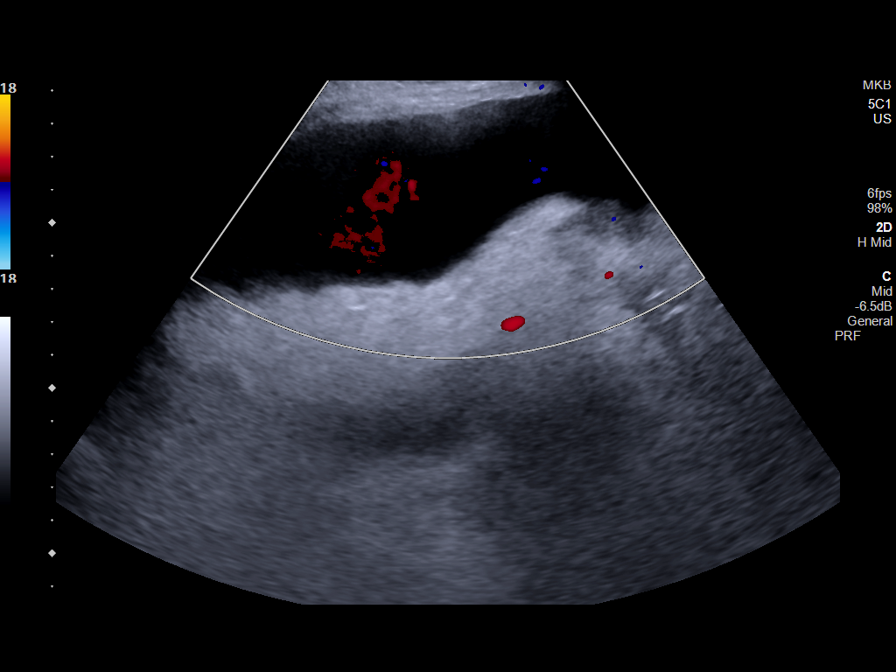
[im 8/9]
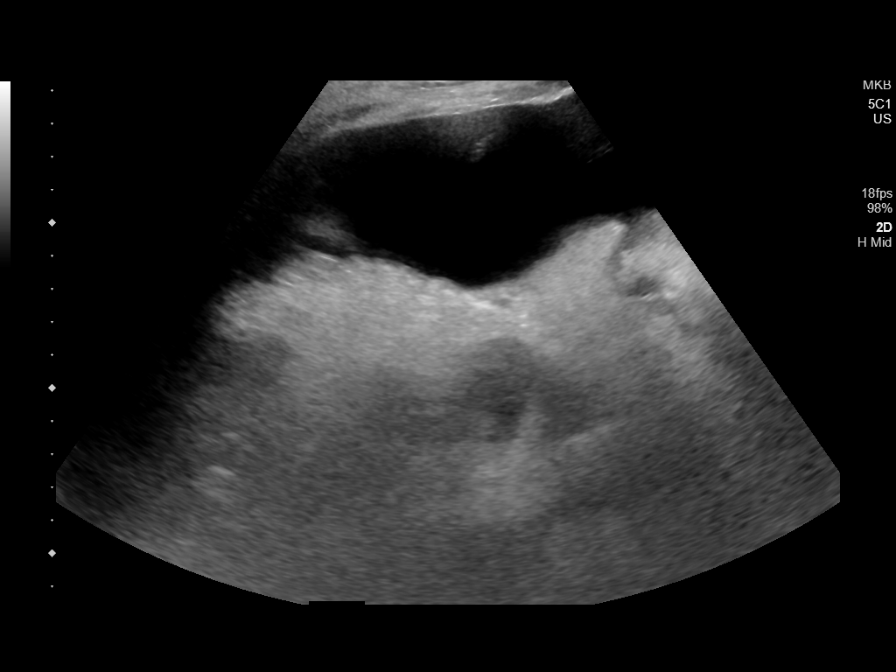
[im 9/9]
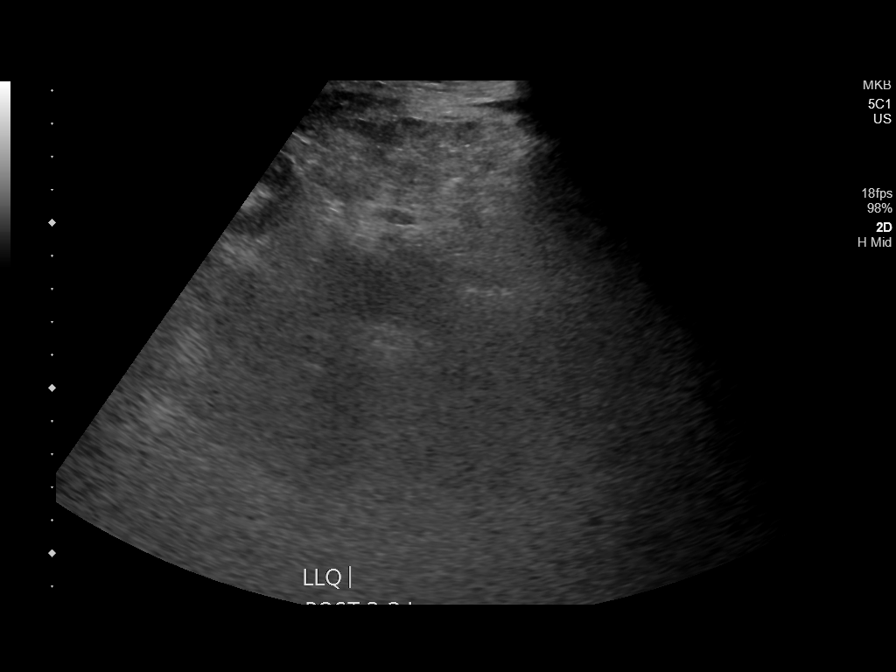

[9 of 9 positions shown; findings below may reference images not displayed]

EXAM:
ULTRASOUND GUIDED PARACENTESIS

MEDICATIONS:
1% lidocaine 10 mL

COMPLICATIONS:
None immediate.

PROCEDURE:
Informed written consent was obtained from the patient after a
discussion of the risks, benefits and alternatives to treatment. A
timeout was performed prior to the initiation of the procedure.

Initial ultrasound scanning demonstrates a large amount of ascites
within the left lower abdominal quadrant. The left lower abdomen was
prepped and draped in the usual sterile fashion. 1% lidocaine was
used for local anesthesia.

Following this, a 19 gauge, 7-cm, Yueh catheter was introduced. An
ultrasound image was saved for documentation purposes. The
paracentesis was performed. The catheter was removed and a dressing
was applied. The patient tolerated the procedure well without
immediate post procedural complication.
FINDINGS: A total of approximately 3.2 L of yellow fluid was removed. Samples
were sent to the laboratory as requested by the clinical team.
IMPRESSION: Successful ultrasound-guided paracentesis yielding 3.2 liters of
peritoneal fluid.

Read by: Gary Parada, NP
